# Patient Record
Sex: Female | Born: 1955 | Race: White | Hispanic: No | Smoking: Former smoker
Health system: Southern US, Community
[De-identification: ages and names within clinical notes are randomized; demographics above are authoritative.]

## PROBLEM LIST (undated history)

## (undated) DIAGNOSIS — R011 Cardiac murmur, unspecified: Secondary | ICD-10-CM

## (undated) DIAGNOSIS — M259 Joint disorder, unspecified: Secondary | ICD-10-CM

## (undated) DIAGNOSIS — E785 Hyperlipidemia, unspecified: Secondary | ICD-10-CM

## (undated) DIAGNOSIS — D649 Anemia, unspecified: Secondary | ICD-10-CM

## (undated) DIAGNOSIS — I10 Essential (primary) hypertension: Secondary | ICD-10-CM

## (undated) DIAGNOSIS — F419 Anxiety disorder, unspecified: Secondary | ICD-10-CM

## (undated) DIAGNOSIS — J189 Pneumonia, unspecified organism: Secondary | ICD-10-CM

## (undated) DIAGNOSIS — M1990 Unspecified osteoarthritis, unspecified site: Secondary | ICD-10-CM

## (undated) DIAGNOSIS — G709 Myoneural disorder, unspecified: Secondary | ICD-10-CM

## (undated) DIAGNOSIS — Z889 Allergy status to unspecified drugs, medicaments and biological substances status: Secondary | ICD-10-CM

## (undated) DIAGNOSIS — C562 Malignant neoplasm of left ovary: Secondary | ICD-10-CM

## (undated) DIAGNOSIS — Z8719 Personal history of other diseases of the digestive system: Secondary | ICD-10-CM

## (undated) DIAGNOSIS — K219 Gastro-esophageal reflux disease without esophagitis: Secondary | ICD-10-CM

## (undated) HISTORY — PX: TONSILLECTOMY AND ADENOIDECTOMY: SHX28

## (undated) HISTORY — PX: JOINT REPLACEMENT: SHX530

## (undated) HISTORY — DX: Essential (primary) hypertension: I10

## (undated) HISTORY — PX: ESOPHAGOGASTRODUODENOSCOPY ENDOSCOPY: SHX5814

## (undated) HISTORY — DX: Anxiety disorder, unspecified: F41.9

## (undated) HISTORY — DX: Malignant neoplasm of left ovary: C56.2

## (undated) HISTORY — PX: ABDOMINAL HYSTERECTOMY: SHX81

## (undated) HISTORY — DX: Hyperlipidemia, unspecified: E78.5

## (undated) HISTORY — PX: DILATION AND CURETTAGE, DIAGNOSTIC / THERAPEUTIC: SUR384

## (undated) HISTORY — DX: Unspecified osteoarthritis, unspecified site: M19.90

---

## 2006-08-31 ENCOUNTER — Emergency Department (HOSPITAL_COMMUNITY): Admission: EM | Admit: 2006-08-31 | Discharge: 2006-08-31 | Payer: Self-pay | Admitting: Emergency Medicine

## 2009-05-09 ENCOUNTER — Other Ambulatory Visit: Admission: RE | Admit: 2009-05-09 | Discharge: 2009-05-09 | Payer: Self-pay | Admitting: Family Medicine

## 2009-06-29 ENCOUNTER — Encounter: Admission: RE | Admit: 2009-06-29 | Discharge: 2009-06-29 | Payer: Self-pay | Admitting: Family Medicine

## 2009-10-28 HISTORY — PX: FOOT SURGERY: SHX648

## 2010-04-04 ENCOUNTER — Encounter: Admission: RE | Admit: 2010-04-04 | Discharge: 2010-04-04 | Payer: Self-pay | Admitting: Podiatry

## 2010-08-21 ENCOUNTER — Ambulatory Visit (HOSPITAL_BASED_OUTPATIENT_CLINIC_OR_DEPARTMENT_OTHER): Admission: RE | Admit: 2010-08-21 | Discharge: 2010-08-21 | Payer: Self-pay | Admitting: Podiatry

## 2010-11-16 ENCOUNTER — Emergency Department (HOSPITAL_COMMUNITY)
Admission: EM | Admit: 2010-11-16 | Discharge: 2010-11-17 | Payer: Self-pay | Source: Home / Self Care | Admitting: Emergency Medicine

## 2010-11-17 ENCOUNTER — Ambulatory Visit (HOSPITAL_COMMUNITY)
Admission: RE | Admit: 2010-11-17 | Discharge: 2010-11-17 | Payer: Self-pay | Source: Home / Self Care | Attending: Emergency Medicine | Admitting: Emergency Medicine

## 2010-11-19 LAB — POCT I-STAT, CHEM 8
BUN: 15 mg/dL (ref 6–23)
Creatinine, Ser: 1 mg/dL (ref 0.4–1.2)
Glucose, Bld: 103 mg/dL — ABNORMAL HIGH (ref 70–99)
HCT: 35 % — ABNORMAL LOW (ref 36.0–46.0)
Hemoglobin: 11.9 g/dL — ABNORMAL LOW (ref 12.0–15.0)
Sodium: 141 mEq/L (ref 135–145)
TCO2: 30 mmol/L (ref 0–100)

## 2011-01-09 LAB — POCT I-STAT, CHEM 8
BUN: 7 mg/dL (ref 6–23)
HCT: 38 % (ref 36.0–46.0)
Potassium: 3.9 mEq/L (ref 3.5–5.1)
Sodium: 143 mEq/L (ref 135–145)
TCO2: 30 mmol/L (ref 0–100)

## 2011-07-28 ENCOUNTER — Emergency Department (HOSPITAL_COMMUNITY): Payer: BC Managed Care – PPO

## 2011-07-28 ENCOUNTER — Emergency Department (HOSPITAL_COMMUNITY)
Admission: EM | Admit: 2011-07-28 | Discharge: 2011-07-28 | Disposition: A | Discharge status: Home / Self Care | Payer: BC Managed Care – PPO | Attending: Emergency Medicine | Admitting: Emergency Medicine

## 2011-07-28 DIAGNOSIS — Z79899 Other long term (current) drug therapy: Secondary | ICD-10-CM | POA: Insufficient documentation

## 2011-07-28 DIAGNOSIS — R079 Chest pain, unspecified: Secondary | ICD-10-CM | POA: Insufficient documentation

## 2011-07-28 DIAGNOSIS — I10 Essential (primary) hypertension: Secondary | ICD-10-CM | POA: Insufficient documentation

## 2011-07-28 LAB — POCT I-STAT TROPONIN I: Troponin i, poc: 0 ng/mL (ref 0.00–0.08)

## 2011-07-28 LAB — CBC
HCT: 34.4 % — ABNORMAL LOW (ref 36.0–46.0)
Hemoglobin: 11.2 g/dL — ABNORMAL LOW (ref 12.0–15.0)
MCHC: 32.6 g/dL (ref 30.0–36.0)
MCV: 81.7 fL (ref 78.0–100.0)
Platelets: 251 10*3/uL (ref 150–400)
RBC: 4.21 MIL/uL (ref 3.87–5.11)
WBC: 7.5 10*3/uL (ref 4.0–10.5)

## 2011-07-28 LAB — DIFFERENTIAL
Eosinophils Relative: 6 % — ABNORMAL HIGH (ref 0–5)
Lymphs Abs: 1.8 10*3/uL (ref 0.7–4.0)
Monocytes Absolute: 0.5 10*3/uL (ref 0.1–1.0)
Monocytes Relative: 6 % (ref 3–12)

## 2011-07-28 LAB — BASIC METABOLIC PANEL: BUN: 8 mg/dL (ref 6–23)

## 2011-08-21 NOTE — Op Note (Signed)
Jacqueline Atkinson, Jacqueline Atkinson                ACCOUNT NO.:  [id number]  MEDICAL RECORD NO.:  [id number]          PATIENT TYPE:  AMB  LOCATION:  [facility]                         FACILITY:  [facility]  PHYSICIAN:  Ezequiel Kayser. , D.P.M.DATE OF BIRTH:  [birth year]  DATE OF PROCEDURE:  08/21/2010 DATE OF DISCHARGE:  08/21/2010                              OPERATIVE REPORT   SURGEON:  Ezequiel Kayser. Harriet Pho, DPM  ASSISTANT:  None.  PREOPERATIVE DIAGNOSIS:  Freiberg disease, degenerative changes third metatarsal head, left foot.  POSTOPERATIVE DIAGNOSIS:  Freiberg disease, degenerative changes third metatarsal head, left foot.  PROCEDURE:  First metatarsal head joint replacement, left foot.  ANESTHESIA:  MAC, LMA.  DESCRIPTION OF PROCEDURE:  The patient was brought to the OR and placed in the supine position at which time LMA anesthesia was administered. Local block was performed with 1:1 mixture of 1% lidocaine plain and 0.5% Marcaine plain.  A well-padded pneumatic ankle tourniquet was applied superior to the medial malleolus.  The patient was prepped and draped in the usual aseptic manner.  Foot was exsanguinated with an Esmarch bandage and the previously applied tourniquet inflated to 250 mmHg.  A curvilinear incision was made over the dorsal aspect of the third MPJ. The incision was deepened to the level of the joint with sharp and blunt modalities, taking care to clamp, cauterize all bleeding vessels, and ensuring retraction of all the vascular structures encountered.  Also care was taken to avoid the extensor tendon.  A lateral capsulotomy was then performed and the joint was resected free.  There was a moderate amount of hypertrophic bone at the head of the third metatarsal which was resected from the metatarsal and a mild amount from the base of the proximal phalanx which was also resected.  The recipient bone was freed of its attachment on the medial dorsal and lateral aspects.  The head  of the first metatarsal was evaluated and there was no cartilage.  It was determined that the metatarsal head was not salvageable and the head needed to be replaced.  A neutral cut was made dorsal to plantar removing nonviable metatarsal head and to allow for flat surface for the implant.  Approximately 1-mm beyond the width of the implant measuring about 4-5 mm was resected.  A trial sizer with a head of the metatarsal was placed over the exposed surfaces to determine the correct size of the prosthesis.  This covered the entire surface without protruding down the bone.  With the sizer in position, a K-wire was inserted to mark the center, and the K-wire driven parallel with the shaft.  Position of the K-wire was confirmed with intraoperative radiographs.  Next, the implant was inserted over the K-wire and the implant driven into the intramedullary canal.  The K-wire was then excised and position of the implant was also again confirmed with intraoperative radiographs.  The surgical wound was irrigated with copious amounts of sterile saline and antibiotic solution.  Capsule was reapproximated with 3-0 Vicryl, deep closure accomplished with 4-0 Vicryl, and skin closure accomplished with 4-0 Monocryl in running subcuticular fashion.  Foot was dressed with Steri-Strips,  4x4s, Kling, and Coban.  The patient was sent to the recovery room with vital signs stable and capillary refill time at presurgical levels once the tourniquet was deflated.  All instructions given to the patient.  No guarantees given.          ______________________________ Ezequiel Kayser. Harriet Pho, D.P.M.     MJA/MEDQ  D:  08/26/2010  T:  08/27/2010  Job:  [id number]  Electronically Signed by Larey Dresser D.P.M. on 08/21/2011 05:55:44 PM

## 2012-03-14 ENCOUNTER — Ambulatory Visit (INDEPENDENT_AMBULATORY_CARE_PROVIDER_SITE_OTHER): Payer: 59 | Admitting: Internal Medicine

## 2012-03-14 VITALS — BP 127/82 | HR 76 | Temp 98.1°F | Resp 16 | Ht 68.0 in | Wt 209.0 lb

## 2012-03-14 DIAGNOSIS — R350 Frequency of micturition: Secondary | ICD-10-CM

## 2012-03-14 DIAGNOSIS — N390 Urinary tract infection, site not specified: Secondary | ICD-10-CM

## 2012-03-14 LAB — POCT URINALYSIS DIPSTICK
Bilirubin, UA: NEGATIVE
Glucose, UA: NEGATIVE
Nitrite, UA: NEGATIVE
Protein, UA: NEGATIVE
Urobilinogen, UA: 0.2
pH, UA: 5

## 2012-03-14 LAB — POCT UA - MICROSCOPIC ONLY
Casts, Ur, LPF, POC: NEGATIVE
Crystals, Ur, HPF, POC: NEGATIVE
Mucus, UA: POSITIVE

## 2012-03-14 MED ORDER — CIPROFLOXACIN HCL 500 MG PO TABS: 500.0000 mg | ORAL_TABLET | Freq: Two times a day (BID) | ORAL | Status: AC

## 2012-03-14 NOTE — Progress Notes (Signed)
  Subjective:    Patient ID: Jacqueline Atkinson, female    DOB: Jun 17, 1956, 56 y.o.   MRN: [id number]  HPIHistory recurrent UTIs Started with symptoms of dysuria and frequency 2 weeks ago AZO- standard for the last 2 weeks but symptoms have only gotten worse this weekend No fever No abdominal pain    Review of Systems     Objective:   Physical Exam Vital signs stable Abdomen nontender No CVA tenderness to percussion       Results for orders placed in visit on 03/14/12  POCT UA - MICROSCOPIC ONLY      Component Value Range   WBC, Ur, HPF, POC tntc     RBC, urine, microscopic 1-3     Bacteria, U Microscopic 3+     Mucus, UA pos     Epithelial cells, urine per micros 2-4     Crystals, Ur, HPF, POC neg     Casts, Ur, LPF, POC neg     Yeast, UA neg    POCT URINALYSIS DIPSTICK      Component Value Range   Color, UA yellow     Clarity, UA clear     Glucose, UA neg     Bilirubin, UA neg     Ketones, UA neg     Spec Grav, UA >=1.030     Blood, UA neg     pH, UA 5.0     Protein, UA neg     Urobilinogen, UA 0.2     Nitrite, UA neg     Leukocytes, UA Trace      Assessment & Plan:  Problem #1 urinary tract infection with urinary frequency Culture/Call with results Meds ordered this encounter  Medications  .       . ciprofloxacin (CIPRO) 500 MG tablet    Sig: Take 1 tablet (500 mg total) by mouth 2 (two) times daily.    Dispense:  20 tablet    Refill:  0

## 2012-11-18 ENCOUNTER — Ambulatory Visit (INDEPENDENT_AMBULATORY_CARE_PROVIDER_SITE_OTHER): Payer: 59 | Admitting: Physician Assistant

## 2012-11-18 VITALS — BP 164/104 | HR 83 | Temp 98.4°F | Resp 17 | Ht 68.0 in | Wt 213.0 lb

## 2012-11-18 DIAGNOSIS — J029 Acute pharyngitis, unspecified: Secondary | ICD-10-CM

## 2012-11-18 DIAGNOSIS — R51 Headache: Secondary | ICD-10-CM

## 2012-11-18 DIAGNOSIS — R509 Fever, unspecified: Secondary | ICD-10-CM

## 2012-11-18 LAB — POCT RAPID STREP A (OFFICE): Rapid Strep A Screen: NEGATIVE

## 2012-11-18 LAB — POCT INFLUENZA A/B
Influenza A, POC: NEGATIVE
Influenza B, POC: NEGATIVE

## 2012-11-18 MED ORDER — OSELTAMIVIR PHOSPHATE 75 MG PO CAPS: 75.0000 mg | ORAL_CAPSULE | Freq: Two times a day (BID) | ORAL | Status: DC

## 2012-11-18 MED ORDER — HYDROCODONE-HOMATROPINE 5-1.5 MG/5ML PO SYRP: 5.0000 mL | ORAL_SOLUTION | Freq: Three times a day (TID) | ORAL | Status: DC | PRN

## 2012-11-18 NOTE — Progress Notes (Signed)
Patient ID: Jacqueline Atkinson MRN: [id number], DOB: [birth year], 57 y.o. Date of Encounter: 11/18/2012, 1:54 PM  Primary Physician: No primary provider on file.  Chief Complaint: fever, chills, nasal congestion  HPI: 57 y.o. year old female with presents with 4 hour history of chills, body aches, sore throat, headache and slight dry cough. Denies chest pain, SOB, nausea, vomiting, or dizziness. Symptoms started suddenly today when she was at work.  Has tried ibuprofen which has helped with her headache. She is currently taking prednisone dose pack - last dose will be tomorrow - for treatment of arthritis.  Does have a history of HTN that has been treated but she missed her physical last year and has been off medication since then.  States her BP at home is usually around 140/90.  No dizziness, lightheadedness, or chest pain.   Patient is otherwise doing well without issues or complaints.  Past Medical History  Diagnosis Date  . Allergy   . Arthritis   . Hypertension      Home Meds: Prior to Admission medications   Medication Sig Start Date End Date Taking? Authorizing Provider  methylPREDNISolone (MEDROL DOSEPAK) 4 MG tablet Take 4 mg by mouth. follow package directions   Yes Historical Provider, MD  bisoprolol-hydrochlorothiazide (ZIAC) 2.5-6.25 MG per tablet Take 1 tablet by mouth daily.    Historical Provider, MD    Allergies:  Allergies  Allergen Reactions  . Sulfa Antibiotics     History   Social History  . Marital Status: Divorced    Spouse Name: N/A    Number of Children: N/A  . Years of Education: N/A   Occupational History  . Not on file.   Social History Main Topics  . Smoking status: Former Smoker    Quit date: 03/14/1988  . Smokeless tobacco: Never Used  . Alcohol Use: Not on file  . Drug Use: Not on file  . Sexually Active: Not on file   Other Topics Concern  . Not on file   Social History Narrative  . No narrative on file     Review of  Systems: Constitutional: positive for chills and fever. Negative for night sweats, weight changes, or fatigue  HEENT: negative for vision changes, hearing loss. Positive for congestion, rhinorrhea, ST. No epistaxis, or sinus pressure Cardiovascular: negative for chest pain or palpitations Respiratory: positive for cough. negative for hemoptysis, wheezing, shortness of breath. Abdominal: negative for abdominal pain, nausea, vomiting, diarrhea, or constipation Dermatological: negative for rashes. Neurologic: negative for headache, dizziness, or syncope   Physical Exam: Blood pressure 164/104, pulse 83, temperature 98.4 F (36.9 C), temperature source Oral, resp. rate 17, height 5\' 8"  (1.727 m), weight 213 lb (96.616 kg), SpO2 96.00%., Body mass index is 32.39 kg/(m^2). General: Well developed, well nourished, in no acute distress. Head: Normocephalic, atraumatic, eyes without discharge, sclera non-icteric, nares are without discharge. Bilateral auditory canals clear, TM's are without perforation, pearly grey and translucent with reflective cone of light bilaterally. Oral cavity moist, posterior pharynx without exudate, erythema, peritonsillar abscess, or post nasal drip.  Neck: Supple. No thyromegaly. Full ROM. No lymphadenopathy. Lungs: Clear bilaterally to auscultation without wheezes, rales, or rhonchi. Breathing is unlabored. Heart: RRR with S1 S2. No murmurs, rubs, or gallops appreciated. Msk:  Strength and tone normal for age. Extremities/Skin: Warm and dry. No clubbing or cyanosis. No edema.  Neuro: Alert and oriented X 3. Moves all extremities spontaneously. Gait is normal. CNII-XII grossly in tact. Psych:  Responds to questions appropriately  with a normal affect.   Labs:   ASSESSMENT AND PLAN:  57 y.o. year old female with influenza Despite negative flu test, will go ahead and start Tamiflu. Increase fluids and rest Ibuprofen and tylenol in alternating doses q4hours Start  Tamiflu 75 mg bid x 5 days Hycodan q8hours prn cough Follow up if symptoms worsen or fail to improve, especially if dyspnea or productive cough develop.  Patient declined treatment of HTN today - I encouraged her to follow up with her PCP.   Grier Mitts, PA-C 11/18/2012 1:54 PM

## 2012-11-27 ENCOUNTER — Ambulatory Visit (INDEPENDENT_AMBULATORY_CARE_PROVIDER_SITE_OTHER): Payer: 59 | Admitting: Family Medicine

## 2012-11-27 ENCOUNTER — Encounter: Payer: Self-pay | Admitting: Family Medicine

## 2012-11-27 VITALS — BP 148/88 | HR 77 | Temp 98.2°F | Resp 18 | Ht 68.0 in | Wt 214.0 lb

## 2012-11-27 DIAGNOSIS — K219 Gastro-esophageal reflux disease without esophagitis: Secondary | ICD-10-CM

## 2012-11-27 DIAGNOSIS — Z0000 Encounter for general adult medical examination without abnormal findings: Secondary | ICD-10-CM

## 2012-11-27 DIAGNOSIS — Z01419 Encounter for gynecological examination (general) (routine) without abnormal findings: Secondary | ICD-10-CM

## 2012-11-27 DIAGNOSIS — Z79899 Other long term (current) drug therapy: Secondary | ICD-10-CM

## 2012-11-27 DIAGNOSIS — Z23 Encounter for immunization: Secondary | ICD-10-CM

## 2012-11-27 DIAGNOSIS — I10 Essential (primary) hypertension: Secondary | ICD-10-CM

## 2012-11-27 LAB — IFOBT (OCCULT BLOOD): IFOBT: NEGATIVE

## 2012-11-27 LAB — CBC WITH DIFFERENTIAL/PLATELET
Basophils Relative: 0 % (ref 0–1)
Eosinophils Absolute: 0.2 10*3/uL (ref 0.0–0.7)
Lymphocytes Relative: 23 % (ref 12–46)
Monocytes Relative: 5 % (ref 3–12)
Neutro Abs: 5.8 10*3/uL (ref 1.7–7.7)
Neutrophils Relative %: 69 % (ref 43–77)
RDW: 16.3 % — ABNORMAL HIGH (ref 11.5–15.5)
WBC: 8.4 10*3/uL (ref 4.0–10.5)

## 2012-11-27 LAB — COMPREHENSIVE METABOLIC PANEL
AST: 14 U/L (ref 0–37)
Albumin: 4.1 g/dL (ref 3.5–5.2)
BUN: 12 mg/dL (ref 6–23)
CO2: 28 mEq/L (ref 19–32)
Creat: 0.72 mg/dL (ref 0.50–1.10)
Potassium: 4 mEq/L (ref 3.5–5.3)
Sodium: 142 mEq/L (ref 135–145)
Total Bilirubin: 0.4 mg/dL (ref 0.3–1.2)
Total Protein: 6.6 g/dL (ref 6.0–8.3)

## 2012-11-27 LAB — LIPID PANEL
HDL: 55 mg/dL (ref >39)
LDL Cholesterol: 139 mg/dL — ABNORMAL HIGH (ref 0–99)
VLDL: 18 mg/dL (ref 0–40)

## 2012-11-27 MED ORDER — BISOPROLOL-HYDROCHLOROTHIAZIDE 2.5-6.25 MG PO TABS: 1.0000 | ORAL_TABLET | Freq: Every day | ORAL | Status: DC

## 2012-11-27 NOTE — Progress Notes (Signed)
Subjective:    Patient ID: Jacqueline Atkinson, female    DOB: 1956/02/16, 57 y.o.   MRN: [id number] Chief Complaint  Patient presents with  . Annual Exam    pt not sure if pap is needed   HPI  Jacqueline Atkinson is here for her HM exam.  She has not had preventative medical care in years as she has been caught up with acute MSK problems.  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  The podiatrist that was following her was concerned that she was hyper-reflexive in her legs so he sent her to neurologist who told her she likely had arthritis in her neck which would cause (??) this since she had arthritis everywhere else in her body but not going to do imaging as no point unless she develops incontinence, weakness, numbness, etc. Now going to PT regularly for muscle spasms in her low back.  So now she is more aware of her pelvic and hips. She is walking better than she was 6 wks ago - trying to use muscles better and stretching but has had 21 doctors and therapy appts this mo.   She does have an appt with the dentist and for an eye exam sched.  Her last mammogram was 2 yrs ago and she has never had a colonoscopy.  She is having a lot of heartburn at night. Was on some prilosec but always forgets to take it as she is not supposed to take it within 4 hrs of taking her indocin.  Past Medical History  Diagnosis Date  . Allergy   . Arthritis   . Hypertension   . Anxiety    Past Surgical History  Procedure Date  . Foot surgery 2011    3rd metatarsal LT foot   Current Outpatient Prescriptions on File Prior to Visit  Medication Sig Dispense Refill  . omeprazole (PRILOSEC) 40 MG capsule Take 1 capsule (40 mg total) by mouth daily.  30 capsule  1  . bisoprolol-hydrochlorothiazide (ZIAC)  2.5-6.25 MG per tablet Take 1 tablet by mouth daily.  90 tablet  0  . HYDROcodone-homatropine (HYCODAN) 5-1.5 MG/5ML syrup Take 5 mLs by mouth every 8 (eight) hours as needed for cough.  120 mL  0  . methylPREDNISolone (MEDROL DOSEPAK) 4 MG tablet Take 4 mg by mouth. follow package directions      . oseltamivir (TAMIFLU) 75 MG capsule Take 1 capsule (75 mg total) by mouth 2 (two) times daily.  10 capsule  0   Allergies  Allergen Reactions  . Sulfa Antibiotics    Family History  Problem Relation Age of Onset  . Cancer Mother   . Diabetes Mother   . Kidney disease Mother   . COPD Father   . Brain cancer Brother    History   Social History  . Marital Status: Divorced    Spouse Name: N/A    Number of Children: N/A  . Years of Education: N/A   Social History Main Topics  . Smoking status: Former Smoker    Quit date: 03/14/1988  . Smokeless tobacco: Never Used  . Alcohol Use: No  . Drug Use: No  . Sexually Active: None   Other Topics Concern  . None  Social History Narrative  . None    Review of Systems  HENT: Positive for postnasal drip and sinus pressure.   Respiratory: Positive for cough.   Musculoskeletal: Positive for myalgias, back pain, joint swelling, arthralgias and gait problem.      BP 148/88  Pulse 77  Temp 98.2 F (36.8 C)  Resp 18  Ht 5\' 8"  (1.727 m)  Wt 214 lb (97.07 kg)  BMI 32.54 kg/m2 Objective:   Physical Exam  Constitutional: She is oriented to person, place, and time. She appears well-developed and well-nourished. No distress.  HENT:  Head: Normocephalic and atraumatic.  Right Ear: Tympanic membrane, external ear and ear canal normal.  Left Ear: Tympanic membrane, external ear and ear canal normal.  Nose: Mucosal edema present. No rhinorrhea.  Mouth/Throat: Uvula is midline, oropharynx is clear and moist and mucous membranes are normal. No posterior oropharyngeal erythema.  Eyes: Conjunctivae normal and EOM are normal. Pupils are equal,  round, and reactive to light. Right eye exhibits no discharge. Left eye exhibits no discharge. No scleral icterus.  Neck: Normal range of motion. Neck supple. No thyromegaly present.  Cardiovascular: Normal rate, regular rhythm, normal heart sounds and intact distal pulses.   Pulmonary/Chest: Effort normal and breath sounds normal. No respiratory distress.  Abdominal: Soft. Bowel sounds are normal. There is no tenderness.  Genitourinary: Vagina normal and uterus normal. Cervix exhibits no motion tenderness and no friability. Right adnexum displays no mass and no tenderness. Left adnexum displays no mass and no tenderness.  Musculoskeletal: She exhibits no edema.  Lymphadenopathy:    She has no cervical adenopathy.  Neurological: She is alert and oriented to person, place, and time. She has normal reflexes.  Skin: Skin is warm and dry. She is not diaphoretic. No erythema.  Psychiatric: She has a normal mood and affect. Her behavior is normal.          Assessment & Plan:  1. HM - Pap done today. Pt agrees to discuss colonoscopy and mammogram referral at next visit but declines today due to the # of doctors visits she has recently had.  Will get Tdap today 2. Elev BP - restart bisoprolol-hctz - recheck bmp and bmp at f/u in 2 mos. 3. GERD - restart prilosec before dinner (30 min if she can) and take indocin after dinner (1 hr if she can). Likely indigestion has been exacerbated by prolonged nsaid use.  1. Hypertension  bisoprolol-hydrochlorothiazide (ZIAC) 2.5-6.25 MG per tablet  2. Encounter for long-term (current) use of other medications  Lipid panel, Comprehensive metabolic panel, CBC with Differential, TSH  3. Routine general medical examination at a health care facility  Comprehensive metabolic panel, CBC with Differential, TSH, IFOBT POC (occult bld, rslt in office), Pap IG and HPV (high risk) DNA detection  4. Need for Tdap vaccination  Tdap vaccine greater than or equal to 7yo IM  5.  Routine gynecological examination  Pap IG and HPV (high risk) DNA detection   Meds ordered this encounter  Medications  . indomethacin (INDOCIN) 25 MG capsule    Sig: Take 25 mg by mouth 2 (two) times daily with a meal.  . DISCONTD: omeprazole (PRILOSEC) 10 MG capsule    Sig: Take 10 mg by mouth daily.  . bisoprolol-hydrochlorothiazide (ZIAC) 2.5-6.25 MG per tablet    Sig: Take 1 tablet by mouth daily.    Dispense:  90 tablet    Refill:  0  . omeprazole (PRILOSEC) 40 MG capsule  Sig: Take 1 capsule (40 mg total) by mouth daily.    Dispense:  30 capsule    Refill:  1

## 2012-11-28 DIAGNOSIS — I10 Essential (primary) hypertension: Secondary | ICD-10-CM | POA: Insufficient documentation

## 2012-11-28 DIAGNOSIS — Z79899 Other long term (current) drug therapy: Secondary | ICD-10-CM | POA: Insufficient documentation

## 2012-11-28 DIAGNOSIS — K219 Gastro-esophageal reflux disease without esophagitis: Secondary | ICD-10-CM | POA: Insufficient documentation

## 2012-11-28 MED ORDER — OMEPRAZOLE 40 MG PO CPDR: 40.0000 mg | DELAYED_RELEASE_CAPSULE | Freq: Every day | ORAL | Status: DC

## 2012-12-02 LAB — PAP IG AND HPV HIGH-RISK: HPV DNA High Risk: NOT DETECTED

## 2012-12-19 ENCOUNTER — Encounter: Payer: Self-pay | Admitting: Nurse Practitioner

## 2012-12-19 DIAGNOSIS — R202 Paresthesia of skin: Secondary | ICD-10-CM | POA: Insufficient documentation

## 2012-12-19 DIAGNOSIS — M25569 Pain in unspecified knee: Secondary | ICD-10-CM | POA: Insufficient documentation

## 2012-12-21 ENCOUNTER — Encounter: Payer: Self-pay | Admitting: Nurse Practitioner

## 2012-12-23 ENCOUNTER — Telehealth: Payer: Self-pay

## 2012-12-23 NOTE — Telephone Encounter (Signed)
Please advise 

## 2012-12-23 NOTE — Telephone Encounter (Signed)
If pt is not tolerating the medication, she should stop taking it.  But she likely needs to be on something else for BP control.  What has she been on before?

## 2012-12-23 NOTE — Telephone Encounter (Signed)
Pt wants Dr. Clelia Croft to change her blood pressure medications bisoprolol-hydrochlorothiazide (ZIAC) 2.5-6.25 MG per tablet   No reduction with meds, she has a constant headache  Pharmacy on [address]   CBN:  [phone]

## 2012-12-24 NOTE — Telephone Encounter (Signed)
166/92 158/95 has previously taken Lisinopril did not have trouble with this [employer], please advise

## 2012-12-24 NOTE — Telephone Encounter (Signed)
Left message for her to call back so I can advise.  

## 2012-12-25 MED ORDER — LISINOPRIL 10 MG PO TABS: 10.0000 mg | ORAL_TABLET | Freq: Every day | ORAL | Status: DC

## 2012-12-25 NOTE — Telephone Encounter (Signed)
Left message to have her call me back.

## 2012-12-25 NOTE — Telephone Encounter (Signed)
Lisinopril sent.  She should RTC in 6 weeks to see how it's working.  Meds ordered this encounter  Medications  . lisinopril (PRINIVIL,ZESTRIL) 10 MG tablet    Sig: Take 1 tablet (10 mg total) by mouth daily. Need office visit for additional refills.    Dispense:  90 tablet    Refill:  0    Order Specific Question:  Supervising Provider    Answer:  DOOLITTLE, ROBERT P [[id number]]

## 2013-01-20 ENCOUNTER — Ambulatory Visit: Payer: 59 | Admitting: Nurse Practitioner

## 2013-02-26 ENCOUNTER — Ambulatory Visit: Payer: 59 | Admitting: Family Medicine

## 2013-03-02 ENCOUNTER — Ambulatory Visit (INDEPENDENT_AMBULATORY_CARE_PROVIDER_SITE_OTHER): Payer: 59 | Admitting: Physician Assistant

## 2013-03-02 ENCOUNTER — Telehealth: Payer: Self-pay

## 2013-03-02 VITALS — BP 150/92 | HR 78 | Temp 98.5°F | Resp 16 | Ht 66.0 in | Wt 218.0 lb

## 2013-03-02 DIAGNOSIS — I10 Essential (primary) hypertension: Secondary | ICD-10-CM

## 2013-03-02 MED ORDER — CHLORTHALIDONE 25 MG PO TABS: 25.0000 mg | ORAL_TABLET | Freq: Every day | ORAL | Status: DC

## 2013-03-02 NOTE — Telephone Encounter (Signed)
PT STATES DR Clelia Croft WANTED TO KEEP A RECORD OF HER BLOOD PRESSURE AND THIS MORNING IT IS 181/101. ADVISED PT TO COME AND SEE DR BUT STATED SHE CAN'T GET OFF PLEASE CALL [phone]

## 2013-03-02 NOTE — Telephone Encounter (Signed)
Pt was scheduled to see me last wk but missed her appt. Fortunately, she came in and saw Maralyn Sago today to treat her HTN.

## 2013-03-02 NOTE — Progress Notes (Signed)
   [address], [place] [address]   Phone [phone]   Subjective:    Patient ID: Jacqueline Atkinson, female    DOB: 1955-12-10, 57 y.o.   MRN: [id number]  HPI Pt presents to clinic for BP check. She was restarted on BP meds in Jan and has a F/u planned on Friday with Dr Clelia Croft but she went to work today with a terrible HA and got her BP checked by the nurse and it was really high.  They checked it several times   160/110 - another reading 192/120 - was the highest reading  after these readings she was told by the nurse at work with her BP and HA that she should not stay at work.  She came here and since the HA has resolved.  She is having no CP and feels good.  She   Review of Systems  Cardiovascular: Negative for chest pain.  Neurological: Positive for headaches (resolved now).       Objective:   Physical Exam  Vitals reviewed. Constitutional: She is oriented to person, place, and time. She appears well-developed and well-nourished.  HENT:  Head: Normocephalic and atraumatic.  Right Ear: External ear normal.  Left Ear: External ear normal.  Eyes: Conjunctivae are normal.  Cardiovascular: Normal rate, regular rhythm and normal heart sounds.   No murmur heard. Pulmonary/Chest: Effort normal.  Neurological: She is alert and oriented to person, place, and time.  Skin: Skin is warm and dry.  Psychiatric: She has a normal mood and affect. Her behavior is normal. Judgment and thought content normal.       Assessment & Plan:  HTN (hypertension) - Plan: chlorthalidone (HYGROTON) 25 MG tablet - Pt will continue on her Lisinopril and we will add diuretic = pt will continue to check her BP at work and f/u on Friday with her scheduled appt.  Benny Lennert PA-C 03/02/2013 4:40 PM

## 2013-03-05 ENCOUNTER — Ambulatory Visit (INDEPENDENT_AMBULATORY_CARE_PROVIDER_SITE_OTHER): Payer: 59 | Admitting: Family Medicine

## 2013-03-05 ENCOUNTER — Encounter: Payer: Self-pay | Admitting: Family Medicine

## 2013-03-05 VITALS — BP 136/62 | HR 81 | Temp 98.2°F | Resp 16 | Ht 67.0 in | Wt 215.0 lb

## 2013-03-05 DIAGNOSIS — E785 Hyperlipidemia, unspecified: Secondary | ICD-10-CM

## 2013-03-05 DIAGNOSIS — I10 Essential (primary) hypertension: Secondary | ICD-10-CM

## 2013-03-05 DIAGNOSIS — Z79899 Other long term (current) drug therapy: Secondary | ICD-10-CM

## 2013-03-05 DIAGNOSIS — R011 Cardiac murmur, unspecified: Secondary | ICD-10-CM

## 2013-03-05 DIAGNOSIS — R51 Headache: Secondary | ICD-10-CM

## 2013-03-05 DIAGNOSIS — E559 Vitamin D deficiency, unspecified: Secondary | ICD-10-CM

## 2013-03-05 DIAGNOSIS — R0989 Other specified symptoms and signs involving the circulatory and respiratory systems: Secondary | ICD-10-CM

## 2013-03-05 DIAGNOSIS — K219 Gastro-esophageal reflux disease without esophagitis: Secondary | ICD-10-CM

## 2013-03-05 LAB — COMPREHENSIVE METABOLIC PANEL
ALT: 18 U/L (ref 0–35)
AST: 19 U/L (ref 0–37)
Albumin: 4.3 g/dL (ref 3.5–5.2)
CO2: 30 mEq/L (ref 19–32)
Calcium: 9.6 mg/dL (ref 8.4–10.5)
Chloride: 100 mEq/L (ref 96–112)
Potassium: 4 mEq/L (ref 3.5–5.3)
Sodium: 139 mEq/L (ref 135–145)
Total Protein: 7.1 g/dL (ref 6.0–8.3)

## 2013-03-05 LAB — LIPID PANEL
Cholesterol: 194 mg/dL (ref 0–200)
Total CHOL/HDL Ratio: 3.7 Ratio
VLDL: 30 mg/dL (ref 0–40)

## 2013-03-05 LAB — CBC WITH DIFFERENTIAL/PLATELET
Basophils Relative: 0 % (ref 0–1)
Lymphocytes Relative: 21 % (ref 12–46)
MCHC: 33.1 g/dL (ref 30.0–36.0)
Monocytes Absolute: 0.6 10*3/uL (ref 0.1–1.0)
Neutro Abs: 5.7 10*3/uL (ref 1.7–7.7)
Platelets: 249 10*3/uL (ref 150–400)
RDW: 15.9 % — ABNORMAL HIGH (ref 11.5–15.5)
WBC: 8.2 10*3/uL (ref 4.0–10.5)

## 2013-03-05 MED ORDER — OMEPRAZOLE 40 MG PO CPDR: 40.0000 mg | DELAYED_RELEASE_CAPSULE | Freq: Every day | ORAL | Status: DC

## 2013-03-05 MED ORDER — BUTALBITAL-APAP-CAFFEINE 50-325-40 MG PO TABS: 1.0000 | ORAL_TABLET | Freq: Four times a day (QID) | ORAL | Status: DC | PRN

## 2013-03-05 NOTE — Progress Notes (Signed)
  Subjective:    Patient ID: Jacqueline Atkinson, female    DOB: [birth year], 57 y.o.   MRN: [id number]  HPI is doing fish oil 1400mg  qd - split into 2doses, flax seen 2600mg  d, red yeast rice 1200mg , niacn 125, can take coenzyme q10, 5000iu vit d daily. .  Started chlorthalidone  No h/o heart murmur. Does get lightheaded after sitting for a while or sometimes if she is up and around. Chronic swelling.  Is getting more exercise tolerance, no SHoB, no CP  Review of Systems     Objective:   Physical Exam  Constitutional: She is oriented to person, place, and time. She appears well-developed and well-nourished. No distress.  HENT:  Head: Normocephalic and atraumatic.  Right Ear: External ear normal.  Left Ear: External ear normal.  Eyes: Conjunctivae are normal. No scleral icterus.  Neck: Normal range of motion. Neck supple. No thyromegaly present.  Cardiovascular: Normal rate, regular rhythm and intact distal pulses.   Murmur heard.  Decrescendo systolic murmur is present with a grade of 3/6  Pulmonary/Chest: Effort normal and breath sounds normal. No respiratory distress.  Musculoskeletal: She exhibits no edema.  Lymphadenopathy:    She has no cervical adenopathy.  Neurological: She is alert and oriented to person, place, and time.  Skin: Skin is warm and dry. She is not diaphoretic. No erythema.  Psychiatric: She has a normal mood and affect. Her behavior is normal.          Assessment & Plan:  Will check bp outside office several times and call in 1-2 wks - if very good, can try stopping lisinopril.

## 2013-03-05 NOTE — Patient Instructions (Signed)
Potassium Content of Foods Potassium is a mineral. The body needs potassium to control blood pressure and to keep the muscles and nervous system healthy. Most foods contain potassium. Eating a variety of foods in the right amounts will help control the level of potassium in your body. Kidney problems can cause there to be too much potassium in the body. If this happens, you may need to lower the amount of potassium in your diet. Some medicines, such as diuretics, may cause your body to lose too much potassium. If this happens, you may need to increase the amount of potassium in your diet. COMMON SERVING SIZES The list below tells you how big or small some common portion sizes are:  1 oz.........4 stacked dice.  3 oz.........Deck of cards.  1 tsp........Tip of little finger.  1 tbs........Thumb.  2 tbs........Golf ball.   cup.......Half of a fist.  1 cup........A fist. FOODS AND DRINKS HIGH IN POTASSIUM More than 250 mg per serving.  Bran cereals and other bran products.  Milk (skim, 1%, 2%, whole).  Buttermilk.  Yogurt.  Avocados.  Bananas.  Dried fruits.  Kiwis.  Oranges.  Prunes.  Raisins.  Baked beans.  Spinach.  Tomatoes.  Peanut butter.  Nuts.  Tofu.  Potatoes. FOODS MODERATE IN POTASSIUM Between 150 to 250 mg per serving.  Cherries.  Mangoes.  Asparagus.  Peas.  Zucchini.  Celery.  C[place]aloupes.  Fresh peaches.  Broccoli stalks.  Peppers.  Figs.  Fresh pears.  Kale.  Summer squashes. FOODS LOW IN POTASSIUM Less than 150 mg per serving.  Pasta.  Rice.  Cottage cheese.  Cheddar cheese.  Apples.  Grapes.  Pineapple.  Raspberries.  Strawberries.  Watermelon.  Green beans.  Cabbage.  Cauliflower.  Corn.  Mushrooms.  Onions.  Eggs. Document Released: 05/28/2005 Document Revised: 01/06/2012 Document Reviewed: 02/28/2012 [employer] Patient Information 2013 [employer].  

## 2013-03-06 LAB — VITAMIN D 25 HYDROXY (VIT D DEFICIENCY, FRACTURES): Vit D, 25-Hydroxy: 44 ng/mL (ref 30–89)

## 2013-03-11 ENCOUNTER — Emergency Department (HOSPITAL_COMMUNITY)
Admission: EM | Admit: 2013-03-11 | Discharge: 2013-03-11 | Disposition: A | Discharge status: Home / Self Care | Payer: 59 | Attending: Emergency Medicine | Admitting: Emergency Medicine

## 2013-03-11 ENCOUNTER — Encounter (HOSPITAL_COMMUNITY): Payer: Self-pay | Admitting: Emergency Medicine

## 2013-03-11 ENCOUNTER — Telehealth: Payer: Self-pay

## 2013-03-11 DIAGNOSIS — Z79899 Other long term (current) drug therapy: Secondary | ICD-10-CM | POA: Insufficient documentation

## 2013-03-11 DIAGNOSIS — R110 Nausea: Secondary | ICD-10-CM | POA: Insufficient documentation

## 2013-03-11 DIAGNOSIS — I10 Essential (primary) hypertension: Secondary | ICD-10-CM | POA: Insufficient documentation

## 2013-03-11 DIAGNOSIS — E878 Other disorders of electrolyte and fluid balance, not elsewhere classified: Secondary | ICD-10-CM | POA: Insufficient documentation

## 2013-03-11 DIAGNOSIS — F411 Generalized anxiety disorder: Secondary | ICD-10-CM | POA: Insufficient documentation

## 2013-03-11 DIAGNOSIS — R42 Dizziness and giddiness: Secondary | ICD-10-CM | POA: Insufficient documentation

## 2013-03-11 DIAGNOSIS — M129 Arthropathy, unspecified: Secondary | ICD-10-CM | POA: Insufficient documentation

## 2013-03-11 DIAGNOSIS — Z87891 Personal history of nicotine dependence: Secondary | ICD-10-CM | POA: Insufficient documentation

## 2013-03-11 DIAGNOSIS — Z7982 Long term (current) use of aspirin: Secondary | ICD-10-CM | POA: Insufficient documentation

## 2013-03-11 MED ORDER — LORAZEPAM 1 MG PO TABS: 1.0000 mg | ORAL_TABLET | Freq: Four times a day (QID) | ORAL | Status: DC | PRN

## 2013-03-11 MED ORDER — MECLIZINE HCL 25 MG PO TABS
25.0000 mg | ORAL_TABLET | Freq: Once | ORAL | Status: AC
  Administered 2013-03-11: 25 mg via ORAL
  Filled 2013-03-11: qty 1

## 2013-03-11 MED ORDER — MECLIZINE HCL 25 MG PO TABS: 25.0000 mg | ORAL_TABLET | Freq: Three times a day (TID) | ORAL | Status: DC

## 2013-03-11 MED ORDER — LORAZEPAM 1 MG PO TABS
1.0000 mg | ORAL_TABLET | Freq: Once | ORAL | Status: AC
  Administered 2013-03-11: 1 mg via ORAL
  Filled 2013-03-11: qty 1

## 2013-03-11 MED ORDER — ACETAMINOPHEN 500 MG PO TABS
1000.0000 mg | ORAL_TABLET | Freq: Once | ORAL | Status: AC
  Administered 2013-03-11: 1000 mg via ORAL
  Filled 2013-03-11: qty 2

## 2013-03-11 NOTE — ED Provider Notes (Signed)
History     CSN: [id number]  Arrival date & time 03/11/13  1503   First MD Initiated Contact with Patient 03/11/13 1630      Chief Complaint  Patient presents with  . Dizziness  . Nausea    (Consider location/radiation/quality/duration/timing/severity/associated sxs/prior treatment) HPI  Patient presents with nausea, disequilibrium, dizziness. She said over the past week she has had similar events, though not as profound as today. States approximately 2 hours ago she suddenly became particularly nauseous, with severe disequilibrium.  Symptoms are worse with head motion, eye opening.  Minimally better at rest. No medication taken thus far. She denies headache, visual acuity loss, confusion, disorientation, vomiting, or falls. She states that her symptoms began without clear precipitant about one week ago, but she has recently started new antihypertensive medication.   Past Medical History  Diagnosis Date  . Allergy   . Arthritis   . Hypertension   . Anxiety     Past Surgical History  Procedure Laterality Date  . Foot surgery  2011    3rd metatarsal LT foot    Family History  Problem Relation Age of Onset  . Cancer Mother   . Diabetes Mother   . Kidney disease Mother   . COPD Father   . Brain cancer Brother     History  Substance Use Topics  . Smoking status: Former Smoker    Quit date: 03/14/1988  . Smokeless tobacco: Never Used  . Alcohol Use: No    OB History   Grav Para Term Preterm Abortions TAB SAB Ect Mult Living                  Review of Systems  All other systems reviewed and are negative.    Allergies  Sulfa antibiotics  Home Medications   Current Outpatient Rx  Name  Route  Sig  Dispense  Refill  . acetaminophen (TYLENOL) 500 MG tablet   Oral   Take 1,000 mg by mouth 2 (two) times daily as needed for pain.         Marland Kitchen aspirin EC 81 MG tablet   Oral   Take 81 mg by mouth daily.         . butalbital-acetaminophen-caffeine  (FIORICET) 50-325-40 MG per tablet   Oral   Take 1-2 tablets by mouth every 6 (six) hours as needed for headache.   20 tablet   0   . chlorthalidone (HYGROTON) 25 MG tablet   Oral   Take 25 mg by mouth every evening.         . Cholecalciferol (VITAMIN D3) 5000 UNITS CAPS   Oral   Take 5,000 mg by mouth every evening.         . Coenzyme Q10 (CO Q 10) 100 MG CAPS   Oral   Take 100 mg by mouth daily.          . Flaxseed, Linseed, 1300 MG CAPS   Oral   Take 1,300 mg by mouth 2 (two) times daily.          . indomethacin (INDOCIN) 25 MG capsule   Oral   Take 25-50 mg by mouth 2 (two) times daily with a meal. 2 tablets (50 mg) every morning and 1 tablet (25 mg) every evening         . lisinopril (PRINIVIL,ZESTRIL) 10 MG tablet   Oral   Take 10 mg by mouth every evening.         Marland Kitchen NIACIN  PO   Oral   Take 62.5 mg by mouth 2 (two) times daily. 1/2 tablet 62.5 mg (tablet is 125 mg)         . Omega-3 Fatty Acids (FISH OIL PO)   Oral   Take 1 capsule by mouth 2 (two) times daily.         Marland Kitchen omeprazole (PRILOSEC) 40 MG capsule   Oral   Take 40 mg by mouth daily with supper.         . Red Yeast Rice Extract (RED YEAST RICE PO)   Oral   Take 2 capsules by mouth 2 (two) times daily.            BP 143/73  Pulse 72  Temp(Src) 97.6 F (36.4 C) (Oral)  Resp 18  SpO2 99%  Physical Exam  Nursing note and vitals reviewed. Constitutional: She is oriented to person, place, and time. She appears well-developed and well-nourished. No distress.  HENT:  Head: Normocephalic and atraumatic.  Eyes: Conjunctivae and EOM are normal.  Cardiovascular: Normal rate and regular rhythm.   Pulmonary/Chest: Effort normal and breath sounds normal. No stridor. No respiratory distress.  Abdominal: She exhibits no distension.  Musculoskeletal: She exhibits no edema.  Neurological: She is alert and oriented to person, place, and time. She displays no atrophy and no tremor. No  cranial nerve deficit or sensory deficit. She exhibits normal muscle tone. She displays no seizure activity. Coordination normal.  Symptoms easily provoked w head turning or rapid alternating eye motion.  Skin: Skin is warm and dry.  Psychiatric: She has a normal mood and affect.    ED Course  Procedures (including critical care time)  Labs Reviewed - No data to display No results found.   No diagnosis found.  O2- 99%ra, normal  7:42 PM Patient's symptoms resolved. She is now moving her head freely, states that she feels significantly better.  We discussed return precautions, medication instructions, home care.   MDM  Patient presents with vertigo like symptoms.  On exam she is awake and alert, neurologically intact, though her symptoms are easily provoked with minor head motion.  The patient improved substantially following provision of medication, and down her vertigo, she had minimal complaints.  Given her general health, the absence of focal neuro findings, imaging was not warranted. The patient was discharged with appropriate followup instructions, return precautions        Gerhard Munch, MD 03/11/13 1943

## 2013-03-11 NOTE — ED Notes (Signed)
Pt states she started feeling very dizzy at 1345 today, pt states she feels like the room is spinning and that she is going to black out. Pt states she is also very nauseous. Pt has a hx of vertigo. Pt recently had changes to her BP medicine.

## 2013-03-11 NOTE — ED Notes (Signed)
Family at bedside. 

## 2013-03-11 NOTE — Telephone Encounter (Signed)
Patient called from work having a bad reaction to new blood pressure medication. Pt is dizzy,light headed, nauseous and cannot sit up at all. Nurse at work took blood pressure and it was 160/102. Spoke to pt briefly who was slurring her words and very hard to understand. On site nurse then picked up call and was not sure if patient should come here or go to ED. Was given the work number: [phone]

## 2013-03-11 NOTE — Telephone Encounter (Signed)
She should go to ER. Especially if she can not sit up . This is not normal, patient slurring her words. Patient agrees. She will have someone take her, or call 911, if she cant get anyone to take her.

## 2013-03-11 NOTE — Telephone Encounter (Signed)
To you FYI  

## 2013-03-11 NOTE — ED Notes (Signed)
The patient is AOx4 and comfortable with her discharge instructions.  Her daughter is driving her home.

## 2013-03-16 NOTE — Telephone Encounter (Signed)
She did go to the ER.

## 2013-03-16 NOTE — Telephone Encounter (Signed)
I called her to see how she is and to see how her BP is running. Called at work, they wanted to take message. Did not leave message. Called her home, she will be in after 6pm.

## 2013-03-16 NOTE — Telephone Encounter (Signed)
Looks like she was diagnosed with benign positional vertigo and that they did not change her BP meds.  If she is still having any problems w/ this still, should come into clinic for further eval and review manuvers for treatments.  Is her BP better or still running high? Is she still taking her new BP med?

## 2013-03-18 NOTE — Telephone Encounter (Signed)
Still unable to reach

## 2013-03-31 NOTE — Telephone Encounter (Signed)
Amy, do we still need to contact pt or did you reach her?

## 2013-03-31 NOTE — Telephone Encounter (Signed)
No, she went to the ER as instructed. Will follow up here.

## 2013-04-08 ENCOUNTER — Other Ambulatory Visit: Payer: Self-pay

## 2013-04-08 ENCOUNTER — Telehealth: Payer: Self-pay

## 2013-04-08 DIAGNOSIS — I10 Essential (primary) hypertension: Secondary | ICD-10-CM

## 2013-04-08 MED ORDER — CHLORTHALIDONE 25 MG PO TABS: 25.0000 mg | ORAL_TABLET | Freq: Every day | ORAL | Status: DC

## 2013-04-08 NOTE — Telephone Encounter (Signed)
Patient is calling about her blood pressure medication [phone]

## 2013-04-10 MED ORDER — LISINOPRIL 10 MG PO TABS: 10.0000 mg | ORAL_TABLET | Freq: Every evening | ORAL | Status: DC

## 2013-04-10 MED ORDER — CHLORTHALIDONE 25 MG PO TABS: 25.0000 mg | ORAL_TABLET | Freq: Every day | ORAL | Status: DC

## 2013-04-10 NOTE — Telephone Encounter (Signed)
Spoke with pt she needs refill on lisinopril and chlorthalidone. Please advise

## 2013-04-10 NOTE — Telephone Encounter (Signed)
Done

## 2013-04-10 NOTE — Telephone Encounter (Signed)
LMOM to CB. 

## 2013-04-27 ENCOUNTER — Other Ambulatory Visit: Payer: Self-pay | Admitting: Family Medicine

## 2013-06-12 ENCOUNTER — Ambulatory Visit (INDEPENDENT_AMBULATORY_CARE_PROVIDER_SITE_OTHER): Payer: 59 | Admitting: Family Medicine

## 2013-06-12 VITALS — BP 140/88 | HR 86 | Temp 98.9°F | Resp 15

## 2013-06-12 DIAGNOSIS — H8110 Benign paroxysmal vertigo, unspecified ear: Secondary | ICD-10-CM

## 2013-06-12 DIAGNOSIS — I10 Essential (primary) hypertension: Secondary | ICD-10-CM

## 2013-06-12 DIAGNOSIS — Z79899 Other long term (current) drug therapy: Secondary | ICD-10-CM

## 2013-06-12 LAB — POCT CBC
Granulocyte percent: 77.7 %G (ref 37–80)
HCT, POC: 37.3 % — AB (ref 37.7–47.9)
Lymph, poc: 1.7 (ref 0.6–3.4)
MCHC: 31.4 g/dL — AB (ref 31.8–35.4)
MCV: 86.2 fL (ref 80–97)
MID (cbc): 0.5 (ref 0–0.9)
POC Granulocyte: 7.6 — AB (ref 2–6.9)
POC LYMPH PERCENT: 17.6 %L (ref 10–50)
Platelet Count, POC: 246 10*3/uL (ref 142–424)
RDW, POC: 17.3 %

## 2013-06-12 LAB — COMPREHENSIVE METABOLIC PANEL
AST: 13 U/L (ref 0–37)
Albumin: 4.2 g/dL (ref 3.5–5.2)
Alkaline Phosphatase: 130 U/L — ABNORMAL HIGH (ref 39–117)
BUN: 13 mg/dL (ref 6–23)
Glucose, Bld: 98 mg/dL (ref 70–99)
Potassium: 3.7 mEq/L (ref 3.5–5.3)
Sodium: 140 mEq/L (ref 135–145)
Total Bilirubin: 0.3 mg/dL (ref 0.3–1.2)

## 2013-06-12 MED ORDER — MECLIZINE HCL 25 MG PO TABS: 25.0000 mg | ORAL_TABLET | Freq: Three times a day (TID) | ORAL | Status: DC

## 2013-06-12 MED ORDER — LISINOPRIL-HYDROCHLOROTHIAZIDE 20-12.5 MG PO TABS: 1.0000 | ORAL_TABLET | Freq: Every day | ORAL | Status: DC

## 2013-06-12 MED ORDER — PROMETHAZINE HCL 25 MG/ML IJ SOLN
25.0000 mg | Freq: Once | INTRAMUSCULAR | Status: AC
  Administered 2013-06-12: 25 mg via INTRAMUSCULAR

## 2013-06-12 NOTE — Patient Instructions (Addendum)
After the acute phase resolves and you have the meclizine (and possibly the ativan as well) on board, you can think about trying the home epley maneuvers to try to move a middle ear stone back into the correct place.  Start with the maneuvers with your head facing whichever way makes you most dizzy.  You can find lots of really great examples of how to do home epley maneuvers on YouTube.  If you have any questions about them or want me to walk you through them, call clinic or come back to clinic.  If your symptoms do not resolve with medication or with the maneuvers or come back or have any of the warning symptoms listed below, call so we can refer you to ENT.   Benign Positional Vertigo Vertigo means you feel like you or your surroundings are moving when they are not. Benign positional vertigo is the most common form of vertigo. Benign means that the cause of your condition is not serious. Benign positional vertigo is more common in older adults. CAUSES  Benign positional vertigo is the result of an upset in the labyrinth system. This is an area in the middle ear that helps control your balance. This may be caused by a viral infection, head injury, or repetitive motion. However, often no specific cause is found. SYMPTOMS  Symptoms of benign positional vertigo occur when you move your head or eyes in different directions. Some of the symptoms may include:  Loss of balance and falls.  Vomiting.  Blurred vision.  Dizziness.  Nausea.  Involuntary eye movements (nystagmus). DIAGNOSIS  Benign positional vertigo is usually diagnosed by physical exam. If the specific cause of your benign positional vertigo is unknown, your caregiver may perform imaging tests, such as magnetic resonance imaging (MRI) or computed tomography (CT). TREATMENT  Your caregiver may recommend movements or procedures to correct the benign positional vertigo. Medicines such as meclizine, benzodiazepines, and medicines for  nausea may be used to treat your symptoms. In rare cases, if your symptoms are caused by certain conditions that affect the inner ear, you may need surgery. HOME CARE INSTRUCTIONS   Follow your caregiver's instructions.  Move slowly. Do not make sudden body or head movements.  Avoid driving.  Avoid operating heavy machinery.  Avoid performing any tasks that would be dangerous to you or others during a vertigo episode.  Drink enough fluids to keep your urine clear or pale yellow. SEEK IMMEDIATE MEDICAL CARE IF:   You develop problems with walking, weakness, numbness, or using your arms, hands, or legs.  You have difficulty speaking.  You develop severe headaches.  Your nausea or vomiting continues or gets worse.  You develop visual changes.  Your family or friends notice any behavioral changes.  Your condition gets worse.  You have a fever.  You develop a stiff neck or sensitivity to light. MAKE SURE YOU:   Understand these instructions.  Will watch your condition.  Will get help right away if you are not doing well or get worse. Document Released: 07/22/2006 Document Revised: 01/06/2012 Document Reviewed: 07/04/2011 [facility] Patient Information 2014 [place], [place]. Vertigo Vertigo means you feel like you or your surroundings are moving when they are not. Vertigo can be dangerous if it occurs when you are at work, driving, or performing difficult activities.  CAUSES  Vertigo occurs when there is a conflict of signals sent to your brain from the visual and sensory systems in your body. There are many different causes of vertigo,  including:  Infections, especially in the inner ear.  A bad reaction to a drug or misuse of alcohol and medicines.  Withdrawal from drugs or alcohol.  Rapidly changing positions, such as lying down or rolling over in bed.  A migraine headache.  Decreased blood flow to the brain.  Increased pressure in the brain from a head injury,  infection, tumor, or bleeding. SYMPTOMS  You may feel as though the world is spinning around or you are falling to the ground. Because your balance is upset, vertigo can cause nausea and vomiting. You may have involuntary eye movements (nystagmus). DIAGNOSIS  Vertigo is usually diagnosed by physical exam. If the cause of your vertigo is unknown, your caregiver may perform imaging tests, such as an MRI scan (magnetic resonance imaging). TREATMENT  Most cases of vertigo resolve on their own, without treatment. Depending on the cause, your caregiver may prescribe certain medicines. If your vertigo is related to body position issues, your caregiver may recommend movements or procedures to correct the problem. In rare cases, if your vertigo is caused by certain inner ear problems, you may need surgery. HOME CARE INSTRUCTIONS   Follow your caregiver's instructions.  Avoid driving.  Avoid operating heavy machinery.  Avoid performing any tasks that would be dangerous to you or others during a vertigo episode.  Tell your caregiver if you notice that certain medicines seem to be causing your vertigo. Some of the medicines used to treat vertigo episodes can actually make them worse in some people. SEEK IMMEDIATE MEDICAL CARE IF:   Your medicines do not relieve your vertigo or are making it worse.  You develop problems with talking, walking, weakness, or using your arms, hands, or legs.  You develop severe headaches.  Your nausea or vomiting continues or gets worse.  You develop visual changes.  A family member notices behavioral changes.  Your condition gets worse. MAKE SURE YOU:  Understand these instructions.  Will watch your condition.  Will get help right away if you are not doing well or get worse. Document Released: 07/24/2005 Document Revised: 01/06/2012 Document Reviewed: 05/02/2011 [facility] Patient Information 2014 [place], [place].

## 2013-06-12 NOTE — Progress Notes (Signed)
Subjective:    Patient ID: Jacqueline Atkinson, female    DOB: [birth year], 57 y.o.   MRN: [id number] Chief Complaint  Patient presents with  . Dizziness    2nd episode x 6 weeks - seen in ED w/Antivert  . Nausea    quiziness    HPI  Seen in the ER for very severe vertigo over 3 mos ago. She was rx'ed Antivert and ativan and only had to take the medication for a few days.  Still has some ativan left but might be out of the antivert.  Few incidences since then of a little lightheaded or off balance for sev hrs since then but no real vertigo recurrence till today.  This morning she was fine, was sitting at a table at her book club and suddenly the room started shifting and spinning.   No vomiting but feeling quesy.  Room spinning when she moves head or eyes, esp worse when moving head to the right, no hearing or other vision changes, no ear pain, no cold/URI/congestion recently.  Can walk but very unsteady and has to use a cane.  BP has been doing well but ran out of the lisinopril - 128/78 on chlorthalidone alone. However, has been having more calf-cramping in the evenings.  Past Medical History  Diagnosis Date  . Allergy   . Arthritis   . Hypertension   . Anxiety    Current Outpatient Prescriptions on File Prior to Visit  Medication Sig Dispense Refill  . acetaminophen (TYLENOL) 500 MG tablet Take 1,000 mg by mouth 2 (two) times daily as needed for pain.      Marland Kitchen aspirin EC 81 MG tablet Take 81 mg by mouth daily.      . Cholecalciferol (VITAMIN D3) 5000 UNITS CAPS Take 5,000 mg by mouth every evening.      . Coenzyme Q10 (CO Q 10) 100 MG CAPS Take 100 mg by mouth daily.       . Flaxseed, Linseed, 1300 MG CAPS Take 1,300 mg by mouth 2 (two) times daily.       . indomethacin (INDOCIN) 25 MG capsule Take 25-50 mg by mouth 2 (two) times daily with a meal. 2 tablets (50 mg) every morning and 1 tablet (25 mg) every evening      . Omega-3 Fatty Acids (FISH OIL PO) Take 1 capsule by mouth 2 (two) times  daily.      Marland Kitchen omeprazole (PRILOSEC) 40 MG capsule Take 40 mg by mouth daily with supper.      . Red Yeast Rice Extract (RED YEAST RICE PO) Take 2 capsules by mouth 2 (two) times daily.       . butalbital-acetaminophen-caffeine (FIORICET, ESGIC) 50-325-40 MG per tablet TAKE ONE TO TWO TABLETS BY MOUTH EVERY 6 HOURS AS NEEDED FOR HEADACHE  20 tablet  0  . LORazepam (ATIVAN) 1 MG tablet Take 1 tablet (1 mg total) by mouth every 6 (six) hours as needed (Vertigo).  15 tablet  0  . NIACIN PO Take 62.5 mg by mouth 2 (two) times daily. 1/2 tablet 62.5 mg (tablet is 125 mg)       No current facility-administered medications on file prior to visit.   Allergies  Allergen Reactions  . Sulfa Antibiotics     Long time ago and does not remember reaction     Review of Systems  Constitutional: Negative for fever, chills, diaphoresis, appetite change and unexpected weight change.  HENT: Negative for hearing loss, ear pain, congestion,  rhinorrhea, neck pain, neck stiffness, sinus pressure, tinnitus and ear discharge.   Eyes: Negative for pain, discharge and visual disturbance.  Gastrointestinal: Positive for nausea. Negative for vomiting.  Musculoskeletal: Positive for gait problem.  Skin: Negative for rash.  Neurological: Positive for dizziness, weakness and light-headedness. Negative for tremors, seizures, syncope, facial asymmetry, speech difficulty, numbness and headaches.  Psychiatric/Behavioral: Negative for sleep disturbance.      BP 140/88  Pulse 86  Temp(Src) 98.9 F (37.2 C) (Oral)  Resp 15  SpO2 98% Objective:   Physical Exam  Constitutional: She is oriented to person, place, and time. She appears well-developed and well-nourished. She appears lethargic. She does not appear ill. She appears distressed.  HENT:  Head: Normocephalic and atraumatic.  Right Ear: External ear and ear canal normal. Tympanic membrane is retracted. No middle ear effusion.  Left Ear: Tympanic membrane, external  ear and ear canal normal. Tympanic membrane is not retracted.  No middle ear effusion.  Nose: No mucosal edema or rhinorrhea.  Mouth/Throat: Uvula is midline and mucous membranes are normal. No oropharyngeal exudate, posterior oropharyngeal edema, posterior oropharyngeal erythema or tonsillar abscesses.  Eyes: Conjunctivae are normal. Right eye exhibits no discharge. Left eye exhibits no discharge. No scleral icterus.  Neck: Normal range of motion. Neck supple.  Cardiovascular: Normal rate, regular rhythm, normal heart sounds and intact distal pulses.   Pulmonary/Chest: Effort normal and breath sounds normal.  Lymphadenopathy:       Head (right side): No submandibular, no preauricular and no posterior auricular adenopathy present.       Head (left side): No submandibular, no preauricular and no posterior auricular adenopathy present.    She has no cervical adenopathy.       Right: No supraclavicular adenopathy present.       Left: No supraclavicular adenopathy present.  Neurological: She is oriented to person, place, and time. She appears lethargic. She displays no atrophy and no tremor. No sensory deficit. She exhibits normal muscle tone. Coordination and gait abnormal.  Skin: Skin is warm and dry. She is not diaphoretic. No erythema.  Psychiatric: She has a normal mood and affect. Her behavior is normal.      Results for orders placed in visit on 06/12/13  POCT CBC      Result Value Range   WBC 9.8  4.6 - 10.2 K/uL   Lymph, poc 1.7  0.6 - 3.4   POC LYMPH PERCENT 17.6  10 - 50 %L   MID (cbc) 0.5  0 - 0.9   POC MID % 4.7  0 - 12 %M   POC Granulocyte 7.6 (*) 2 - 6.9   Granulocyte percent 77.7  37 - 80 %G   RBC 4.33  4.04 - 5.48 M/uL   Hemoglobin 11.7 (*) 12.2 - 16.2 g/dL   HCT, POC 16.1 (*) 09.6 - 47.9 %   MCV 86.2  80 - 97 fL   MCH, POC 27.0  27 - 31.2 pg   MCHC 31.4 (*) 31.8 - 35.4 g/dL   RDW, POC 04.5     Platelet Count, POC 246  142 - 424 K/uL   MPV 11.1  0 - 99.8 fL     Assessment & Plan:  Encounter for long-term (current) use of other medications - Plan: Comprehensive metabolic panel  Benign paroxysmal positional vertigo - Plan: POCT CBC, promethazine (PHENERGAN) injection 25 mg IM x 1 now- to dizzy to review epley manuvers in office. Can try Epley maneuvers at home after  meclizine is on board - hand-out given or YouTube Epley maneuvers for right ear. Call or RTC if confusion. If sxs don't resolve or worsen, refer to ENT.  HTN - concern the chlorthalidone could be exacerbating the vertigo due to dehydration (told she was dehydrated in the er) and recent calf-cramping.  D/c and start lisinopril-hctz alternatively which she did well on in the past - was previously on lisinopril 10 but BP not ideally controlled on this.  Meds ordered this encounter  Medications  . meclizine (ANTIVERT) 25 MG tablet    Sig: Take 1 tablet (25 mg total) by mouth 3 (three) times daily.    Dispense:  21 tablet    Refill:  0  . promethazine (PHENERGAN) injection 25 mg    Sig:   . lisinopril-hydrochlorothiazide (ZESTORETIC) 20-12.5 MG per tablet    Sig: Take 1 tablet by mouth daily.    Dispense:  90 tablet    Refill:  0

## 2013-06-21 ENCOUNTER — Telehealth: Payer: Self-pay

## 2013-06-21 DIAGNOSIS — H8110 Benign paroxysmal vertigo, unspecified ear: Secondary | ICD-10-CM

## 2013-06-21 NOTE — Telephone Encounter (Signed)
Dr Clelia Croft: Patient states that she is still having issues with vertigo and would like to have that appointment with an ENT Doctor.  Please contact patient about the referral.   Best#: [phone]

## 2013-06-21 NOTE — Telephone Encounter (Signed)
Left message for her to call me back. 

## 2013-06-21 NOTE — Telephone Encounter (Signed)
Referral to ENT placed.  In the meantime, if she is still having problems, I would be happy to refill her meclizine as needed and if she wants to come back in so we can walk her through the Epley maneuvers and make sure she is doing them correctly, that may help.

## 2013-07-17 NOTE — Telephone Encounter (Signed)
Pt had appt w/ENT on 06/25/13.

## 2013-10-17 ENCOUNTER — Other Ambulatory Visit: Payer: Self-pay | Admitting: Family Medicine

## 2013-10-29 ENCOUNTER — Ambulatory Visit (INDEPENDENT_AMBULATORY_CARE_PROVIDER_SITE_OTHER): Payer: 59 | Admitting: Emergency Medicine

## 2013-10-29 VITALS — BP 132/84 | HR 102 | Temp 98.3°F | Resp 18 | Ht 67.0 in | Wt 216.0 lb

## 2013-10-29 DIAGNOSIS — J111 Influenza due to unidentified influenza virus with other respiratory manifestations: Secondary | ICD-10-CM

## 2013-10-29 MED ORDER — OSELTAMIVIR PHOSPHATE 75 MG PO CAPS: 75.0000 mg | ORAL_CAPSULE | Freq: Two times a day (BID) | ORAL | Status: DC

## 2013-10-29 NOTE — Progress Notes (Signed)
[facility] [address] [place] [address] [phone]  Date:  10/29/2013   Name:  Iyanah Demont   DOB:  24-Feb-1956   MRN:  [id number]  PCP:  [name], MD    Chief Complaint: Sore Throat, Generalized Body Aches and Fatigue   History of Present Illness:  Kema Santaella is a 58 y.o. very pleasant female patient who presents with the following:  Ill suddenly today with malaise and myalgias.  Scant cough.  No fever or chills.  No wheezing or shortness of breath.  Has a sore throat.  No nausea or vomiting.  No rash or stool change.  No improvement with over the counter medications or other home remedies. Denies other complaint or health concern today.   Patient Active Problem List   Diagnosis Date Noted  . Knee pain 12/19/2012  . Paresthesia 12/19/2012  . Hypertension 11/28/2012  . Encounter for long-term (current) use of other medications 11/28/2012  . GERD (gastroesophageal reflux disease) 11/28/2012    Past Medical History  Diagnosis Date  . Allergy   . Arthritis   . Hypertension   . Anxiety     Past Surgical History  Procedure Laterality Date  . Foot surgery  2011    3rd metatarsal LT foot    History  Substance Use Topics  . Smoking status: Former Smoker    Quit date: 03/14/1988  . Smokeless tobacco: Never Used  . Alcohol Use: No    Family History  Problem Relation Age of Onset  . Cancer Mother   . Diabetes Mother   . Kidney disease Mother   . COPD Father   . Brain cancer Brother     Allergies  Allergen Reactions  . Sulfa Antibiotics     Long time ago and does not remember reaction    Medication list has been reviewed and updated.  Current Outpatient Prescriptions on File Prior to Visit  Medication Sig Dispense Refill  . acetaminophen (TYLENOL) 500 MG tablet Take 1,000 mg by mouth 2 (two) times daily as needed for pain.      . Cholecalciferol (VITAMIN D3) 5000 UNITS CAPS Take 5,000 mg by mouth every evening.      . Coenzyme  Q10 (CO Q 10) 100 MG CAPS Take 100 mg by mouth daily.       . Flaxseed, Linseed, 1300 MG CAPS Take 1,300 mg by mouth 2 (two) times daily.       . indomethacin (INDOCIN) 25 MG capsule Take 25-50 mg by mouth 2 (two) times daily with a meal. 2 tablets (50 mg) every morning and 1 tablet (25 mg) every evening      . lisinopril-hydrochlorothiazide (PRINZIDE,ZESTORETIC) 20-12.5 MG per tablet TAKE ONE TABLET BY MOUTH ONCE DAILY  90 tablet  0  . Omega-3 Fatty Acids (FISH OIL PO) Take 1 capsule by mouth 2 (two) times daily.      . Red Yeast Rice Extract (RED YEAST RICE PO) Take 2 capsules by mouth 2 (two) times daily.       Marland Kitchen aspirin EC 81 MG tablet Take 81 mg by mouth daily.      . butalbital-acetaminophen-caffeine (FIORICET, ESGIC) 50-325-40 MG per tablet TAKE ONE TO TWO TABLETS BY MOUTH EVERY 6 HOURS AS NEEDED FOR HEADACHE  20 tablet  0  . LORazepam (ATIVAN) 1 MG tablet Take 1 tablet (1 mg total) by mouth every 6 (six) hours as needed (Vertigo).  15 tablet  0  . meclizine (ANTIVERT)  25 MG tablet Take 1 tablet (25 mg total) by mouth 3 (three) times daily.  21 tablet  0  . NIACIN PO Take 62.5 mg by mouth 2 (two) times daily. 1/2 tablet 62.5 mg (tablet is 125 mg)      . omeprazole (PRILOSEC) 40 MG capsule Take 40 mg by mouth daily with supper.       No current facility-administered medications on file prior to visit.    Review of Systems:  As per HPI, otherwise negative.    Physical Examination: Filed Vitals:   10/29/13 1452  BP: 132/84  Pulse: 102  Temp: 98.3 F (36.8 C)  Resp: 18   Filed Vitals:   10/29/13 1452  Height: 5\' 7"  (1.702 m)  Weight: 216 lb (97.977 kg)   Body mass index is 33.82 kg/(m^2). Ideal Body Weight: Weight in (lb) to have BMI = 25: 159.3  GEN: WDWN, NAD, Non-toxic, A & O x 3 HEENT: Atraumatic, Normocephalic. Neck supple. No masses, No LAD. Ears and Nose: No external deformity. CV: RRR, No M/G/R. No JVD. No thrill. No extra heart sounds. PULM: CTA B, no wheezes,  crackles, rhonchi. No retractions. No resp. distress. No accessory muscle use. ABD: S, NT, ND, +BS. No rebound. No HSM. EXTR: No c/c/e NEURO Normal gait.  PSYCH: Normally interactive. Conversant. Not depressed or anxious appearing.  Calm demeanor.    Assessment and Plan: Influenza tamiflu  Signed,  Ellison Carwin, MD

## 2013-10-29 NOTE — Patient Instructions (Signed)

## 2013-11-09 ENCOUNTER — Ambulatory Visit (INDEPENDENT_AMBULATORY_CARE_PROVIDER_SITE_OTHER): Payer: 59 | Admitting: Emergency Medicine

## 2013-11-09 ENCOUNTER — Ambulatory Visit
Admission: RE | Admit: 2013-11-09 | Discharge: 2013-11-09 | Disposition: A | Discharge status: Home / Self Care | Payer: 59 | Source: Ambulatory Visit | Attending: Emergency Medicine | Admitting: Emergency Medicine

## 2013-11-09 ENCOUNTER — Other Ambulatory Visit: Payer: 59

## 2013-11-09 VITALS — BP 170/100 | HR 81 | Temp 99.3°F | Resp 17 | Ht 67.0 in | Wt 216.0 lb

## 2013-11-09 DIAGNOSIS — R51 Headache: Secondary | ICD-10-CM

## 2013-11-09 DIAGNOSIS — I10 Essential (primary) hypertension: Secondary | ICD-10-CM

## 2013-11-09 MED ORDER — LISINOPRIL-HYDROCHLOROTHIAZIDE 10-12.5 MG PO TABS: 1.0000 | ORAL_TABLET | Freq: Every day | ORAL | Status: DC

## 2013-11-09 NOTE — Progress Notes (Signed)
[facility] [address], [place] [address] [phone]  Date:  11/09/2013   Name:  Jacqueline Atkinson   DOB:  1956-07-21   MRN:  [id number]  PCP:  [name], MD    Chief Complaint: Hypertension   History of Present Illness:  Jacqueline Atkinson is a 58 y.o. very pleasant female patient who presents with the following:  Developed a severe occipital headache this morning at 1100.  Had a headache all week related to flu.  No neuro or visual symptoms.  No cough or coryza.  No fever or chills. Says she is chronically unsteady on her feet and has balance trouble related to pain in knees.  No nausea or vomiting.  No stool change or rash.  Has not missed any doses of antihypertensive.  No improvement with over the counter medications or other home remedies. Denies other complaint or health concern today.   Patient Active Problem List   Diagnosis Date Noted  . Knee pain 12/19/2012  . Paresthesia 12/19/2012  . Hypertension 11/28/2012  . Encounter for long-term (current) use of other medications 11/28/2012  . GERD (gastroesophageal reflux disease) 11/28/2012    Past Medical History  Diagnosis Date  . Allergy   . Arthritis   . Hypertension   . Anxiety     Past Surgical History  Procedure Laterality Date  . Foot surgery  2011    3rd metatarsal LT foot    History  Substance Use Topics  . Smoking status: Former Smoker    Quit date: 03/14/1988  . Smokeless tobacco: Never Used  . Alcohol Use: No    Family History  Problem Relation Age of Onset  . Cancer Mother   . Diabetes Mother   . Kidney disease Mother   . COPD Father   . Brain cancer Brother     Allergies  Allergen Reactions  . Sulfa Antibiotics     Long time ago and does not remember reaction    Medication list has been reviewed and updated.  Current Outpatient Prescriptions on File Prior to Visit  Medication Sig Dispense Refill  . acetaminophen (TYLENOL) 500 MG tablet Take 1,000 mg by mouth 2 (two)  times daily as needed for pain.      . Cholecalciferol (VITAMIN D3) 5000 UNITS CAPS Take 5,000 mg by mouth every evening.      . Coenzyme Q10 (CO Q 10) 100 MG CAPS Take 100 mg by mouth daily.       . indomethacin (INDOCIN) 25 MG capsule Take 25-50 mg by mouth 2 (two) times daily with a meal. 2 tablets (50 mg) every morning and 1 tablet (25 mg) every evening      . lisinopril-hydrochlorothiazide (PRINZIDE,ZESTORETIC) 20-12.5 MG per tablet TAKE ONE TABLET BY MOUTH ONCE DAILY  90 tablet  0  . LORazepam (ATIVAN) 1 MG tablet Take 1 tablet (1 mg total) by mouth every 6 (six) hours as needed (Vertigo).  15 tablet  0  . meclizine (ANTIVERT) 25 MG tablet Take 1 tablet (25 mg total) by mouth 3 (three) times daily.  21 tablet  0  . NIACIN PO Take 62.5 mg by mouth 2 (two) times daily. 1/2 tablet 62.5 mg (tablet is 125 mg)      . omeprazole (PRILOSEC) 40 MG capsule Take 40 mg by mouth daily with supper.       No current facility-administered medications on file prior to visit.    Review of Systems:  As per HPI,  otherwise negative.    Physical Examination: Filed Vitals:   11/09/13 1450  BP: 170/100  Pulse: 81  Temp: 99.3 F (37.4 C)  Resp: 17   Filed Vitals:   11/09/13 1450  Height: 5\' 7"  (1.702 m)  Weight: 216 lb (97.977 kg)   Body mass index is 33.82 kg/(m^2). Ideal Body Weight: Weight in (lb) to have BMI = 25: 159.3  GEN: WDWN, NAD, Non-toxic, A & O x 3 HEENT: Atraumatic, Normocephalic. Neck supple. No masses, No LAD.  PRRERLA EOMI  Ears and Nose: No external deformity. CV: RRR, No M/G/R. No JVD. No thrill. No extra heart sounds. PULM: CTA B, no wheezes, crackles, rhonchi. No retractions. No resp. distress. No accessory muscle use. ABD: S, NT, ND, +BS. No rebound. No HSM. EXTR: No c/c/e NEURO Normal gait.  Impaired tandem gait and romberg.   PSYCH: Normally interactive. Conversant. Not depressed or anxious appearing.  Calm demeanor.    Assessment and Plan: Atypical severe  headache Hypertension Increase lisinopril HCTZ Follow up in 2 weeks to check BP   Signed,  Ellison Carwin, MD

## 2013-11-09 NOTE — Patient Instructions (Signed)
Amorita Ph# [phone]. We ill call you with the resutls

## 2013-11-21 ENCOUNTER — Ambulatory Visit
Admission: RE | Admit: 2013-11-21 | Discharge: 2013-11-21 | Disposition: A | Discharge status: Home / Self Care | Payer: 59 | Source: Ambulatory Visit | Attending: Emergency Medicine | Admitting: Emergency Medicine

## 2013-11-21 DIAGNOSIS — I10 Essential (primary) hypertension: Secondary | ICD-10-CM

## 2013-11-21 DIAGNOSIS — R51 Headache: Secondary | ICD-10-CM

## 2013-11-22 ENCOUNTER — Other Ambulatory Visit: Payer: Self-pay | Admitting: Emergency Medicine

## 2013-11-22 DIAGNOSIS — D329 Benign neoplasm of meninges, unspecified: Secondary | ICD-10-CM

## 2013-12-18 ENCOUNTER — Other Ambulatory Visit: Payer: Self-pay | Admitting: Emergency Medicine

## 2013-12-18 ENCOUNTER — Other Ambulatory Visit: Payer: Self-pay | Admitting: Family Medicine

## 2013-12-29 ENCOUNTER — Ambulatory Visit (INDEPENDENT_AMBULATORY_CARE_PROVIDER_SITE_OTHER): Payer: 59 | Admitting: Neurology

## 2013-12-29 ENCOUNTER — Encounter: Payer: Self-pay | Admitting: Neurology

## 2013-12-29 VITALS — BP 143/89 | HR 74 | Ht 66.0 in | Wt 212.0 lb

## 2013-12-29 DIAGNOSIS — M549 Dorsalgia, unspecified: Secondary | ICD-10-CM | POA: Insufficient documentation

## 2013-12-29 DIAGNOSIS — R269 Unspecified abnormalities of gait and mobility: Secondary | ICD-10-CM

## 2013-12-29 NOTE — Progress Notes (Signed)
PATIENT: Jacqueline Atkinson DOB: February 16, 1956  HISTORICAL  Jacqueline Atkinson is a 58 yo RH white female was referred by Neurosurgeon Dr. 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 Amil Amen, she has a regular knee cortisone shots, each time has the fluid taken out, which only help her walking temporarily,   Her gait difficulty has gradually getting worse, she tends to veer to her right side, over the past few months, she also lost extremity of a right hand, tends to use her left hand, difficulty to sign her name using her right hand,   She has mild numbness tingling in her toes, and area of her feet, over past few weeks, she also developed urinary urgency, incontinence, occasionally bowel accident,   She also had a few month history of upper back pain, getting worse over the past few weeks, feels like it is penetrating her from back to front.  In January she developed episode of acute onset severe headaches, this led to a CAT scan, later MRI of the brain, with without contrast, which showed a left anterior fossa meningioma, 1.7 cm, with no surrounding edema, Partially empty sella and prominent peri optic spaces noted.    MR angiogram: No abnormal flow seen within the anterior left middle cranial fossa structure which has an appearance most consistent with a meningioma  rather than aneurysm.No large vessel disease   MRI cervical  which was done in February 2015 and [facility] imaging, has multilevel mild degenerative disc disease, most severe at C4-5,  5-6 level, but there was no significant foraminal or canal stenosis.  There was no family history of similar gait disorder, she has 7 children, none of them has the gait disorder  REVIEW OF SYSTEMS: Full 14 system review of systems performed and notable only for many years, joint pain, joint swelling, cramps, achy muscles, headaches, weakness, decreased energy  ALLERGIES: Allergies  Allergen Reactions  . Sulfa Antibiotics     Long time ago and does not remember reaction    HOME MEDICATIONS: Current Outpatient Prescriptions on File Prior to Visit  Medication Sig Dispense Refill  . acetaminophen (TYLENOL) 500 MG tablet Take 1,000 mg by mouth 2 (two) times daily as needed for pain.      . Cholecalciferol (VITAMIN D3) 5000 UNITS CAPS Take 5,000 mg by mouth every evening.      . Coenzyme Q10 (CO Q 10) 100 MG CAPS Take 100 mg by mouth daily.       . indomethacin (INDOCIN) 25 MG capsule Take 25-50 mg by mouth 2 (two) times daily with a meal. 2 tablets (50 mg) every morning and 1 tablet (25 mg) every evening      . lisinopril-hydrochlorothiazide (PRINZIDE,ZESTORETIC) 10-12.5 MG per tablet Take 1 tablet by mouth daily. PATIENT NEED BP RECHECK VISIT FOR ADDITIONAL REFILLS  30 tablet  0  . lisinopril-hydrochlorothiazide (PRINZIDE,ZESTORETIC) 20-12.5 MG per tablet Take 1 tablet by mouth daily. PATIENT BP RECHECK VISIT FOR ADDITIONAL REFILLS  30 tablet  0   No current facility-administered medications on file prior  to visit.    PAST MEDICAL HISTORY: Past Medical History  Diagnosis Date  . Allergy   . Arthritis   . Hypertension   . Anxiety     PAST SURGICAL HISTORY: Past Surgical History  Procedure Laterality Date  . Foot surgery  2011    3rd metatarsal LT foot  . Tonsillectomy and adenoidectomy      FAMILY HISTORY: Family History  Problem Relation Age of Onset  . Cancer Mother   . Diabetes Mother   . Kidney disease Mother   . COPD Father   . Brain cancer Brother     SOCIAL  HISTORY:  History   Social History  . Marital Status: Divorced    Spouse Name: N/A    Number of Children: 7  . Years of Education: 12   Occupational History  . ACTIVITY COORDINATOR, lead activity    Social History Main Topics  . Smoking status: Former Smoker    Quit date: 03/14/1988  . Smokeless tobacco: Never Used  . Alcohol Use: No  . Drug Use: No  . Sexual Activity: Not on file   Other Topics Concern  . Not on file   Social History Narrative  . No narrative on file   PHYSICAL EXAM   Filed Vitals:   12/29/13 0807  BP: 143/89  Pulse: 74  Height: 5\' 6"  (1.676 m)  Weight: 212 lb (96.163 kg)   Body mass index is 34.23 kg/(m^2).   Generalized: In no acute distress  Neck: Supple, no carotid bruits   Cardiac: Regular rate rhythm  Pulmonary: Clear to auscultation bilaterally  Musculoskeletal: No deformity  Neurological examination  Mentation: Alert oriented to time, place, history taking, and causual conversation  Cranial nerve II-XII: Pupils were equal round reactive to light. Extraocular movements were full.  Visual field were full on confrontational test. Bilateral fundi were sharp.  Facial sensation and strength were normal. Hearing was intact to finger rubbing bilaterally. Uvula tongue midline.  Head turning and shoulder shrug and were normal and symmetric.Tongue protrusion into cheek strength was normal.  Motor: She has no significant weakness of both upper and lower extremities, she has moderate rigidity of right upper extremity, mild rigidity of bilateral lower extremity, flatfeet bilaterally, mild deformity of left foot.  Sensory: Intact to fine touch, pinprick, preserved vibratory sensation, and proprioception at toes.  Coordination: Normal finger to nose, heel-to-shin bilaterally there was no truncal ataxia  Gait: Rising up from seated position multiple attempts, pushing on  chair arm, stiff, cautious, mildly unsteady gait, Viator right  side  Romberg signs: Negative  Deep tendon reflexes: Brachioradialis 3/3, biceps 3/3, triceps 2/2, patellar 3/3, Achilles 2/2, plantar responses were  extensor  bilaterally.She also has a left Hoffman sign   DIAGNOSTIC DATA (LABS, IMAGING, TESTING) - I reviewed patient records, labs, notes, testing and imaging myself where available.  Lab Results  Component Value Date   WBC 9.8 06/12/2013   HGB 11.7* 06/12/2013   HCT 37.3* 06/12/2013   MCV 86.2 06/12/2013   PLT 249 03/05/2013      Component Value Date/Time   NA 140 06/12/2013 1322   K 3.7 06/12/2013 1322   CL 102 06/12/2013 1322   CO2 29 06/12/2013 1322   GLUCOSE 98 06/12/2013 1322   BUN 13 06/12/2013 1322   CREATININE 0.74 06/12/2013 1322   CREATININE 0.60 07/28/2011 1200   CALCIUM 9.3 06/12/2013 1322   PROT 6.8 06/12/2013 1322   ALBUMIN 4.2 06/12/2013 1322   AST 13  06/12/2013 1322   ALT 15 06/12/2013 1322   ALKPHOS 130* 06/12/2013 1322   BILITOT 0.3 06/12/2013 1322   GFRNONAA >60 07/28/2011 1200   GFRAA >60 07/28/2011 1200   Lab Results  Component Value Date   CHOL 194 03/05/2013   HDL 52 03/05/2013   LDLCALC 112* 03/05/2013   TRIG 148 03/05/2013   CHOLHDL 3.7 03/05/2013    Lab Results  Component Value Date   TSH 0.649 11/27/2012     ASSESSMENT AND PLAN  Jacqueline Atkinson is a 58 y.o. female  with gradual onset gait difficulties, hyperreflexia of both upper and lower extremities, right upper extremity rigidity, bilateral Babinski signs, left Hoffman signs,  1.  suggested the central nervous system etiology, localize the lesion to the brain, cervical spine, or thoracic spine, but there was only incidental finding of left anterior mid fossa meningioma, which will not explained her current symptoms,  etiology could be central nervous system degenerative disorder, nutritional deficiency, paraneoplastic syndrome, infectious etiology 2. Laboratory evaluation 3 MRI of thoracic and lumbar spine 4 EMG nerve conduction study.  5 physical therapy .          Marcial Pacas, M.D. Ph.D.  [facility] [address], [place], [place] [address] [phone]

## 2013-12-30 LAB — COMPREHENSIVE METABOLIC PANEL
ALT: 14 IU/L (ref 0–32)
AST: 17 IU/L (ref 0–40)
Albumin/Globulin Ratio: 1.8 (ref 1.1–2.5)
Albumin: 4.5 g/dL (ref 3.5–5.5)
Alkaline Phosphatase: 155 IU/L — ABNORMAL HIGH (ref 39–117)
BUN/Creatinine Ratio: 19 (ref 9–23)
BUN: 14 mg/dL (ref 6–24)
CALCIUM: 9.4 mg/dL (ref 8.7–10.2)
CO2: 23 mmol/L (ref 18–29)
Chloride: 98 mmol/L (ref 97–108)
Creatinine, Ser: 0.75 mg/dL (ref 0.57–1.00)
GFR calc Af Amer: 102 mL/min/{1.73_m2} (ref >59)
GFR calc non Af Amer: 89 mL/min/{1.73_m2} (ref >59)
Globulin, Total: 2.5 g/dL (ref 1.5–4.5)
Glucose: 104 mg/dL — ABNORMAL HIGH (ref 65–99)
POTASSIUM: 4.6 mmol/L (ref 3.5–5.2)
SODIUM: 142 mmol/L (ref 134–144)
Total Bilirubin: <0.2 mg/dL (ref 0.0–1.2)
Total Protein: 7 g/dL (ref 6.0–8.5)

## 2013-12-30 LAB — COPPER, SERUM: COPPER: 141 ug/dL (ref 72–166)

## 2013-12-30 LAB — FOLATE: Folate: 10.2 ng/mL (ref >3.0)

## 2013-12-30 LAB — LIPID PANEL
CHOLESTEROL TOTAL: 212 mg/dL — AB (ref 100–199)
Chol/HDL Ratio: 4.4 ratio units (ref 0.0–4.4)
HDL: 48 mg/dL (ref >39)
LDL Calculated: 140 mg/dL — ABNORMAL HIGH (ref 0–99)
Triglycerides: 120 mg/dL (ref 0–149)
VLDL Cholesterol Cal: 24 mg/dL (ref 5–40)

## 2013-12-30 LAB — THYROID PANEL WITH TSH
Free Thyroxine Index: 1.5 (ref 1.2–4.9)
T3 UPTAKE RATIO: 27 % (ref 24–39)
T4 TOTAL: 5.7 ug/dL (ref 4.5–12.0)
TSH: 0.951 u[IU]/mL (ref 0.450–4.500)

## 2013-12-30 LAB — HIV ANTIBODY (ROUTINE TESTING W REFLEX)
HIV 1/HIV 2 AB: NONREACTIVE
HIV 1/O/2 Abs-Index Value: <1 (ref <1.00)

## 2013-12-30 LAB — C-REACTIVE PROTEIN: CRP: 21.4 mg/L — ABNORMAL HIGH (ref 0.0–4.9)

## 2013-12-30 LAB — SEDIMENTATION RATE: SED RATE: 61 mm/h — AB (ref 0–40)

## 2013-12-30 LAB — ANA W/REFLEX IF POSITIVE: Anti Nuclear Antibody(ANA): NEGATIVE

## 2013-12-30 LAB — RPR: SYPHILIS RPR SCR: NONREACTIVE

## 2013-12-30 LAB — CK: CK TOTAL: 51 U/L (ref 24–173)

## 2013-12-30 LAB — VITAMIN B12: Vitamin B-12: 368 pg/mL (ref 211–946)

## 2014-01-06 ENCOUNTER — Encounter (INDEPENDENT_AMBULATORY_CARE_PROVIDER_SITE_OTHER): Payer: PRIVATE HEALTH INSURANCE

## 2014-01-06 ENCOUNTER — Ambulatory Visit (INDEPENDENT_AMBULATORY_CARE_PROVIDER_SITE_OTHER): Payer: 59 | Admitting: Neurology

## 2014-01-06 DIAGNOSIS — M549 Dorsalgia, unspecified: Secondary | ICD-10-CM

## 2014-01-06 DIAGNOSIS — R269 Unspecified abnormalities of gait and mobility: Secondary | ICD-10-CM

## 2014-01-06 DIAGNOSIS — Z0289 Encounter for other administrative examinations: Secondary | ICD-10-CM

## 2014-01-06 DIAGNOSIS — R202 Paresthesia of skin: Secondary | ICD-10-CM

## 2014-01-06 NOTE — Procedures (Signed)
   NCS (NERVE CONDUCTION STUDY) WITH EMG (ELECTROMYOGRAPHY) REPORT   STUDY DATE: March 12th 2015 PATIENT NAME: Jacqueline Atkinson DOB: 1956/08/26 MRN: [id number]    TECHNOLOGIST: Laretta Alstrom ELECTROMYOGRAPHER: Marcial Pacas M.D.  CLINICAL INFORMATION:    58 years old female, complains of gradual onset gait difficulty, she is 2011, also complains of recent onset right arm difficulty, mild bilateral feet paresthesia. hyperreflexia on examination.  FINDINGS: NERVE CONDUCTION STUDY: Bilateral peroneal sensory responses were normal. Bilateral peroneal to EDB, and tibial motor responses were normal. Bilateral tibial H. reflexes were normal and symmetric.   Right median, ulnar sensory and motor responses were normal.  NEEDLE ELECTROMYOGRAPHY: Selected needle examination was performed at right upper, lower extremity muscles and right cervical, lumbar paraspinal muscles.  Needle examination of right tibialis anterior, Tibialis posterior, vastus lateralis, medial gastrocnemius, biceps femoris long head was normal.  Needle examination of right extensor digitorum communis, biceps, triceps, deltoid was normal.  There was no spontaneous activity at right cervical paraspinal muscles, right C5, 6, 7.  There was no spontaneous activity at right lumbosacral paraspinal muscles, right L4, 5, S1  IMPRESSION:   This is a normal study. There was no electrodiagnostic evidence of large fiber peripheral neuropathy, right cervical radiculopathy or right lumbosacral radiculopathy.   INTERPRETING PHYSICIAN:   Marcial Pacas M.D. Ph.D. [facility] [address], [place], [place] [address] [phone]

## 2014-01-12 ENCOUNTER — Ambulatory Visit (INDEPENDENT_AMBULATORY_CARE_PROVIDER_SITE_OTHER): Payer: 59

## 2014-01-12 ENCOUNTER — Other Ambulatory Visit: Payer: Self-pay | Admitting: Neurology

## 2014-01-12 DIAGNOSIS — R269 Unspecified abnormalities of gait and mobility: Secondary | ICD-10-CM

## 2014-01-12 DIAGNOSIS — M549 Dorsalgia, unspecified: Secondary | ICD-10-CM

## 2014-01-17 ENCOUNTER — Encounter: Payer: Self-pay | Admitting: Neurology

## 2014-01-17 ENCOUNTER — Ambulatory Visit (INDEPENDENT_AMBULATORY_CARE_PROVIDER_SITE_OTHER): Payer: 59 | Admitting: Neurology

## 2014-01-17 VITALS — BP 128/85 | HR 72 | Ht 66.0 in | Wt 212.0 lb

## 2014-01-17 DIAGNOSIS — M25569 Pain in unspecified knee: Secondary | ICD-10-CM

## 2014-01-17 DIAGNOSIS — R202 Paresthesia of skin: Secondary | ICD-10-CM

## 2014-01-17 DIAGNOSIS — R209 Unspecified disturbances of skin sensation: Secondary | ICD-10-CM

## 2014-01-17 DIAGNOSIS — G20 Parkinson's disease: Secondary | ICD-10-CM

## 2014-01-17 DIAGNOSIS — R269 Unspecified abnormalities of gait and mobility: Secondary | ICD-10-CM

## 2014-01-17 DIAGNOSIS — M549 Dorsalgia, unspecified: Secondary | ICD-10-CM

## 2014-01-17 MED ORDER — CARBIDOPA-LEVODOPA ER 25-100 MG PO TBCR
1.0000 | EXTENDED_RELEASE_TABLET | Freq: Two times a day (BID) | ORAL | Status: DC
Start: 2014-01-17 — End: 2014-03-22

## 2014-01-17 MED ORDER — CARBIDOPA-LEVODOPA 25-100 MG PO TABS: 2.0000 | ORAL_TABLET | Freq: Three times a day (TID) | ORAL | Status: DC

## 2014-01-17 NOTE — Progress Notes (Signed)
PATIENT: Jacqueline Atkinson DOB: 1955-12-30  HISTORICAL  Jacqueline Atkinson is a 58 yo RH white female was referred by Neurosurgeon Dr. 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 Amil Amen, she has a regular knee cortisone shots, each time has the fluid taken out, which only help her walking temporarily,   Her gait difficulty has gradually getting worse, she tends to veer to her right side, over the past few months, she also lost extremity of a right hand, tends to use her left hand, difficulty to sign her name using her right hand,   She has mild numbness tingling in her toes, and ball area of her feet, over past few weeks, since Dec 2014, she also developed urinary urgency, incontinence, occasionally bowel accident,   She also had a few month history of upper back pain, getting worse over the past few weeks, feels like it is penetrating her from back to front.  In January 2015, she developed episode of acute onset severe headaches, this led to a CAT scan, later MRI of the brain, with without contrast, which showed a left anterior fossa meningioma, 1.7 cm, with no surrounding edema, Partially empty sella and prominent peri optic spaces noted.    MR angiogram: No abnormal flow seen within the anterior left middle cranial fossa structure which has an appearance most consistent with a meningioma  rather than aneurysm.No large vessel disease   MRI cervical  which was done in February 2015 and [facility] imaging, has multilevel mild degenerative disc  disease, most severe at C4-5, 5-6 level, but there was no significant foraminal or canal stenosis.  There was no family history of similar gait disorder, she has 7 children, none of them has the gait disorder  UPDATE January 17 2014: She had worsening gait difficulty, over the past 3 months, since January 2015, she also noticed right hand difficulty, there is differential, she could no longer pain, but not holding her brushes, worsening handwriting, worsening gait difficulty,  We have reviewed MRI of thoracic spine, that was normal, MRI of lumbar spine, mild generative disc disease, no significant canal or foraminal stenosis.  She denies memory trouble, she denies orthostatic symptoms, she denies dysarthria, no dysphagia  She lives with her daughters, EMG nerve conduction study was normal  REVIEW OF SYSTEMS: Full 14 system review of systems performed and notable only for ringing in ears, joint pain, joint swelling, back pain, achy muscles, muscle cramps, walking difficulties, coordination problem, weakness, tremor,    ALLERGIES: Allergies  Allergen Reactions  . Sulfa Antibiotics     Long time ago and does not remember reaction    HOME MEDICATIONS: Current Outpatient Prescriptions on File Prior to Visit  Medication Sig Dispense Refill  . acetaminophen (TYLENOL) 500 MG tablet Take 1,000 mg by mouth 2 (two) times daily as needed for pain.      . Cholecalciferol (VITAMIN D3) 5000 UNITS CAPS Take 5,000 mg by mouth every evening.      . Coenzyme Q10 (CO Q 10) 100 MG CAPS Take 100 mg  by mouth daily.       . indomethacin (INDOCIN) 25 MG capsule Take 25-50 mg by mouth 2 (two) times daily with a meal. 2 tablets (50 mg) every morning and 1 tablet (25 mg) every evening      . lisinopril-hydrochlorothiazide (PRINZIDE,ZESTORETIC) 10-12.5 MG per tablet Take 1 tablet by mouth daily. PATIENT NEED BP RECHECK VISIT FOR ADDITIONAL REFILLS  30 tablet  0  . lisinopril-hydrochlorothiazide  (PRINZIDE,ZESTORETIC) 20-12.5 MG per tablet Take 1 tablet by mouth daily. PATIENT BP RECHECK VISIT FOR ADDITIONAL REFILLS  30 tablet  0  . TAMIFLU 75 MG capsule Take 75 capsules by mouth daily.       No current facility-administered medications on file prior to visit.    PAST MEDICAL HISTORY: Past Medical History  Diagnosis Date  . Allergy   . Arthritis   . Hypertension   . Anxiety     PAST SURGICAL HISTORY: Past Surgical History  Procedure Laterality Date  . Foot surgery  2011    3rd metatarsal LT foot  . Tonsillectomy and adenoidectomy      FAMILY HISTORY: Family History  Problem Relation Age of Onset  . Cancer Mother   . Diabetes Mother   . Kidney disease Mother   . COPD Father   . Brain cancer Brother     SOCIAL HISTORY:  History   Social History  . Marital Status: Divorced    Spouse Name: N/A    Number of Children: 7  . Years of Education: 12   Occupational History  . ACTIVITY COORDINATOR, lead activity    Social History Main Topics  . Smoking status: Former Smoker    Quit date: 03/14/1988  . Smokeless tobacco: Never Used  . Alcohol Use: No  . Drug Use: No  . Sexual Activity: Not on file   Other Topics Concern  . Not on file   Social History Narrative  . No narrative on file   PHYSICAL EXAM   Filed Vitals:   01/17/14 1027  BP: 128/85  Pulse: 72  Height: 5\' 6"  (1.676 m)  Weight: 212 lb (96.163 kg)   Body mass index is 34.23 kg/(m^2).   Generalized: In no acute distress  Neck: Supple, no carotid bruits   Cardiac: Regular rate rhythm  Pulmonary: Clear to auscultation bilaterally  Musculoskeletal: No deformity  Neurological examination  Mentation: Alert oriented to time, place, history taking, and causual conversation  Cranial nerve II-XII: Pupils were equal round reactive to light. Extraocular movements were full.  Visual field were full on confrontational test. Bilateral fundi were sharp.  Facial sensation and strength were  normal. Hearing was intact to finger rubbing bilaterally. Uvula tongue midline.  Head turning and shoulder shrug and were normal and symmetric.Tongue protrusion into cheek strength was normal.  Motor: She has no significant weakness of both upper and lower extremities, she has moderate rigidity of right upper extremity, moderate rigidity of bilateral lower extremity, right worse than left flatfeet bilaterally, mild deformity of left foot.  Sensory: Intact to fine touch, pinprick, preserved vibratory sensation, and proprioception at toes.  Coordination: Normal finger to nose, heel-to-shin bilaterally there was no truncal ataxia  Gait: Rising up from seated position multiple attempts, pushing on  chair arm, stiff, cautious, very unsteady gait, leaning forward, dragging her right leg  Romberg signs: Negative  Deep tendon reflexes: Brachioradialis 3/3, biceps 3/3, triceps 2/2, patellar 3/3, Achilles 2/2, plantar responses were  extensor  bilaterally.She also has a left Hoffman sign  DIAGNOSTIC DATA (LABS, IMAGING, TESTING) - I reviewed patient records, labs, notes, testing and imaging myself where available.  Lab Results  Component Value Date   WBC 9.8 06/12/2013   HGB 11.7* 06/12/2013   HCT 37.3* 06/12/2013   MCV 86.2 06/12/2013   PLT 249 03/05/2013      Component Value Date/Time   NA 142 12/29/2013 0911   NA 140 06/12/2013 1322   K 4.6 12/29/2013 0911   CL 98 12/29/2013 0911   CO2 23 12/29/2013 0911   GLUCOSE 104* 12/29/2013 0911   GLUCOSE 98 06/12/2013 1322   BUN 14 12/29/2013 0911   BUN 13 06/12/2013 1322   CREATININE 0.75 12/29/2013 0911   CREATININE 0.74 06/12/2013 1322   CALCIUM 9.4 12/29/2013 0911   PROT 7.0 12/29/2013 0911   PROT 6.8 06/12/2013 1322   ALBUMIN 4.2 06/12/2013 1322   AST 17 12/29/2013 0911   ALT 14 12/29/2013 0911   ALKPHOS 155* 12/29/2013 0911   BILITOT <0.2 12/29/2013 0911   GFRNONAA 89 12/29/2013 0911   GFRAA 102 12/29/2013 0911   Lab Results  Component Value Date   CHOL 194 03/05/2013    HDL 48 12/29/2013   LDLCALC 140* 12/29/2013   TRIG 120 12/29/2013   CHOLHDL 4.4 12/29/2013    Lab Results  Component Value Date   TSH 0.951 12/29/2013     ASSESSMENT AND PLAN  Zahriah Mikita is a 58 y.o. female  with gradual onset gait difficulties, hyperreflexia of both upper and lower extremities, right upper and lower extremity rigidity, bilateral Babinski signs, left Hoffman signs,  1.  suggested the central nervous system etiology, with parkinsonian features, localize the lesion to the brain, left hemisphere differentiation diagnosis including cortical basal ganglion degeneration, Multisystem atrophy  2, will try sinemet 25/100 ii tid 3. Home pt 4. Refer to [facility] Dr. Hoyt Koch. 5. RTC in 2 months  Marcial Pacas, M.D. Ph.D.  [facility] [address] [place], [place] [address] [phone]

## 2014-01-18 ENCOUNTER — Telehealth: Payer: Self-pay

## 2014-01-18 NOTE — Telephone Encounter (Signed)
Le - Pt has a BP follow up appointment with you on April 10.  She has about 10 days worth of lisinopril left.  Can we please refill this to last her until her appointment?  [phone]

## 2014-01-19 MED ORDER — LISINOPRIL-HYDROCHLOROTHIAZIDE 20-12.5 MG PO TABS: 1.0000 | ORAL_TABLET | Freq: Every day | ORAL | Status: DC

## 2014-01-19 MED ORDER — LISINOPRIL-HYDROCHLOROTHIAZIDE 10-12.5 MG PO TABS: 1.0000 | ORAL_TABLET | Freq: Every day | ORAL | Status: DC

## 2014-01-19 NOTE — Telephone Encounter (Signed)
Notified pt 1 mos of each sent in to cover until appt, but make sure she keeps appt so we can make sure BP is now controlled. Pt agreed.

## 2014-02-04 ENCOUNTER — Ambulatory Visit (INDEPENDENT_AMBULATORY_CARE_PROVIDER_SITE_OTHER): Payer: 59 | Admitting: Family Medicine

## 2014-02-04 ENCOUNTER — Encounter: Payer: Self-pay | Admitting: Family Medicine

## 2014-02-04 VITALS — BP 158/88 | HR 69 | Temp 98.7°F | Resp 16 | Ht 66.5 in | Wt 202.4 lb

## 2014-02-04 DIAGNOSIS — E785 Hyperlipidemia, unspecified: Secondary | ICD-10-CM

## 2014-02-04 DIAGNOSIS — I10 Essential (primary) hypertension: Secondary | ICD-10-CM

## 2014-02-04 MED ORDER — LISINOPRIL-HYDROCHLOROTHIAZIDE 20-12.5 MG PO TABS: 1.0000 | ORAL_TABLET | Freq: Every day | ORAL | Status: DC

## 2014-02-04 NOTE — Progress Notes (Signed)
Chief Complaint:  Chief Complaint  Patient presents with  . Hypertension  . Medication Refill    Lisinopril-HCTZ 20-12.5 mg    HPI: Jacqueline Atkinson is a 58 y.o. female who is here for  Recheck HTN meds , she normally checks her BP and at home it runs in the  130/80s  Recent dx of possible parkinsons,s tarted on Sinemet and has only been on it for 3 weeks.  She ahs no SEs, she is compliant with her meds.   Past Medical History  Diagnosis Date  . Allergy   . Arthritis   . Hypertension   . Anxiety   . Hyperlipidemia    Past Surgical History  Procedure Laterality Date  . Foot surgery  2011    3rd metatarsal LT foot  . Tonsillectomy and adenoidectomy     History   Social History  . Marital Status: Divorced    Spouse Name: N/A    Number of Children: 7  . Years of Education: 12   Occupational History  . ACTIVITY COORDINATOR    Social History Main Topics  . Smoking status: Former Smoker    Quit date: 03/14/1988  . Smokeless tobacco: Never Used  . Alcohol Use: No  . Drug Use: No  . Sexual Activity: None   Other Topics Concern  . None   Social History Narrative  . None   Family History  Problem Relation Age of Onset  . Cancer Mother   . Diabetes Mother   . Kidney disease Mother   . COPD Father   . Brain cancer Brother    Allergies  Allergen Reactions  . Sulfa Antibiotics     Long time ago and does not remember reaction   Prior to Admission medications   Medication Sig Start Date End Date Taking? Authorizing Provider  acetaminophen (TYLENOL) 500 MG tablet Take 1,000 mg by mouth 2 (two) times daily as needed for pain.   Yes Historical Provider, MD  Carbidopa-Levodopa ER (SINEMET CR) 25-100 MG tablet controlled release Take 1 tablet by mouth 2 (two) times daily. 01/17/14  Yes Marcial Pacas, MD  Cholecalciferol (VITAMIN D3) 5000 UNITS CAPS Take 5,000 mg by mouth every evening.   Yes Historical Provider, MD  Coenzyme Q10 (CO Q 10) 100 MG CAPS Take 100 mg by  mouth daily.    Yes Historical Provider, MD  indomethacin (INDOCIN) 25 MG capsule Take 25-50 mg by mouth 2 (two) times daily with a meal. 2 tablets (50 mg) every morning and 1 tablet (25 mg) every evening   Yes Historical Provider, MD  lisinopril-hydrochlorothiazide (PRINZIDE,ZESTORETIC) 20-12.5 MG per tablet Take 1 tablet by mouth daily. PATIENT BP RECHECK VISIT FOR ADDITIONAL REFILLS 01/19/14  Yes Ryan M Dunn, PA-C  carbidopa-levodopa (SINEMET IR) 25-100 MG per tablet Take 2 tablets by mouth 3 (three) times daily. 01/17/14   Marcial Pacas, MD  lisinopril-hydrochlorothiazide (PRINZIDE,ZESTORETIC) 10-12.5 MG per tablet Take 1 tablet by mouth daily. PATIENT NEED BP RECHECK VISIT FOR ADDITIONAL REFILLS 01/19/14   Areta Haber Dunn, PA-C  TAMIFLU 75 MG capsule Take 75 capsules by mouth daily. 10/29/13   Historical Provider, MD     ROS: The patient denies fevers, chills, night sweats, unintentional weight loss, chest pain, palpitations, wheezing, dyspnea on exertion, nausea, vomiting, abdominal pain, dysuria, hematuria, melena, numbness, weakness, or tingling.   All other systems have been reviewed and were otherwise negative with the exception of those mentioned in the HPI and as above.  PHYSICAL EXAM: Filed Vitals:   02/04/14 0840  BP: 158/88  Pulse: 69  Temp: 98.7 F (37.1 C)  Resp: 16  Repeat BP was 135/85 Filed Vitals:   02/04/14 0840  Height: 5' 6.5" (1.689 m)  Weight: 202 lb 6.4 oz (91.808 kg)   Body mass index is 32.18 kg/(m^2).  General: Alert, no acute distress HEENT:  Normocephalic, atraumatic, oropharynx patent. EOMI, PERRLA Cardiovascular:  Regular rate and rhythm, no rubs murmurs or gallops.  No Carotid bruits, radial pulse intact. No pedal edema.  Respiratory: Clear to auscultation bilaterally.  No wheezes, rales, or rhonchi.  No cyanosis, no use of accessory musculature GI: No organomegaly, abdomen is soft and non-tender, positive bowel sounds.  No masses. Skin: No  rashes. Neurologic: Facial musculature symmetric. Psychiatric: Patient is appropriate throughout our interaction. Lymphatic: No cervical lymphadenopathy Musculoskeletal: Gait intact.   LABS: Results for orders placed in visit on 12/29/13  RPR      Result Value Ref Range   RPR Non Reactive  Non Reactive  VITAMIN B12      Result Value Ref Range   Vitamin B-12 368  211 - 946 pg/mL  HIV ANTIBODY (ROUTINE TESTING)      Result Value Ref Range   HIV 1/O/2 Abs-Index Value <1.00  <1.00   HIV-1/HIV-2 Ab Non Reactive  Non Reactive  FOLATE      Result Value Ref Range   Folate 10.2  >3.0 ng/mL  C-REACTIVE PROTEIN      Result Value Ref Range   CRP 21.4 (*) 0.0 - 4.9 mg/L  THYROID PANEL WITH TSH      Result Value Ref Range   TSH 0.951  0.450 - 4.500 uIU/mL   T4, Total 5.7  4.5 - 12.0 ug/dL   T3 Uptake Ratio 27  24 - 39 %   Free Thyroxine Index 1.5  1.2 - 4.9  CK      Result Value Ref Range   Total CK 51  24 - 173 U/L  SEDIMENTATION RATE      Result Value Ref Range   Sed Rate 61 (*) 0 - 40 mm/hr  COMPREHENSIVE METABOLIC PANEL      Result Value Ref Range   Glucose 104 (*) 65 - 99 mg/dL   BUN 14  6 - 24 mg/dL   Creatinine, Ser 0.75  0.57 - 1.00 mg/dL   GFR calc non Af Amer 89  >59 mL/min/1.73   GFR calc Af Amer 102  >59 mL/min/1.73   BUN/Creatinine Ratio 19  9 - 23   Sodium 142  134 - 144 mmol/L   Potassium 4.6  3.5 - 5.2 mmol/L   Chloride 98  97 - 108 mmol/L   CO2 23  18 - 29 mmol/L   Calcium 9.4  8.7 - 10.2 mg/dL   Total Protein 7.0  6.0 - 8.5 g/dL   Albumin 4.5  3.5 - 5.5 g/dL   Globulin, Total 2.5  1.5 - 4.5 g/dL   Albumin/Globulin Ratio 1.8  1.1 - 2.5   Total Bilirubin <0.2  0.0 - 1.2 mg/dL   Alkaline Phosphatase 155 (*) 39 - 117 IU/L   AST 17  0 - 40 IU/L   ALT 14  0 - 32 IU/L  ANA W/REFLEX IF POSITIVE      Result Value Ref Range   ANA Negative  Negative  COPPER, SERUM      Result Value Ref Range   Copper 141  72 - 166 ug/dL  LIPID PANEL      Result Value Ref  Range   Cholesterol, Total 212 (*) 100 - 199 mg/dL   Triglycerides 120  0 - 149 mg/dL   HDL 48  >39 mg/dL   VLDL Cholesterol Cal 24  5 - 40 mg/dL   LDL Calculated 140 (*) 0 - 99 mg/dL   Chol/HDL Ratio 4.4  0.0 - 4.4 ratio units     EKG/XRAY:   Primary read interpreted by Dr. Marin Comment at [facility]   ASSESSMENT/PLAN: Encounter Diagnoses  Name Primary?  . HTN (hypertension) Yes  . Other and unspecified hyperlipidemia    Repeat BP was 135/85 Refilled Lisinopril/HCTZ She does have hyperlipidemia and because of her new onset gait issues and dx of parkinson, she would prefer to use only fish oil for now.  We will recheck her cholesterol in 4-6 months  Gross sideeffects, risk and benefits, and alternatives of medications d/w patient. Patient is aware that all medications have potential sideeffects and we are unable to predict every sideeffect or drug-drug interaction that may occur.  Tiyana Galla P Donovyn Guidice, DO 02/04/2014 9:20 AM

## 2014-02-09 ENCOUNTER — Encounter: Payer: 59 | Admitting: Neurology

## 2014-02-14 ENCOUNTER — Telehealth: Payer: Self-pay | Admitting: Neurology

## 2014-02-14 DIAGNOSIS — M25569 Pain in unspecified knee: Secondary | ICD-10-CM

## 2014-02-14 DIAGNOSIS — G20 Parkinson's disease: Secondary | ICD-10-CM

## 2014-02-14 DIAGNOSIS — M549 Dorsalgia, unspecified: Secondary | ICD-10-CM

## 2014-02-14 DIAGNOSIS — R202 Paresthesia of skin: Secondary | ICD-10-CM

## 2014-02-14 NOTE — Telephone Encounter (Signed)
Spoke with patient informed her that a new order will be placed, patient states that she is getting around a lot better and states that she does not need home physical therapy, she states she is able to go to a facility.

## 2014-02-14 NOTE — Telephone Encounter (Signed)
Patient calling to state that [name] mentioned physical therapy referral during last office visit but patient has not heard anything back yet. Please call and advise patient.

## 2014-02-14 NOTE — Telephone Encounter (Deleted)
Per last note patient was ordered home physical therapy and,  no orders in the system please reorder this referral for home physical therapy.

## 2014-02-15 NOTE — Telephone Encounter (Signed)
Please let patient know, I have entered order for outpatient physical therapy

## 2014-02-17 ENCOUNTER — Ambulatory Visit: Payer: 59 | Attending: Neurology | Admitting: Physical Therapy

## 2014-02-17 DIAGNOSIS — IMO0001 Reserved for inherently not codable concepts without codable children: Secondary | ICD-10-CM | POA: Insufficient documentation

## 2014-02-17 DIAGNOSIS — G20 Parkinson's disease: Secondary | ICD-10-CM | POA: Insufficient documentation

## 2014-02-17 DIAGNOSIS — R269 Unspecified abnormalities of gait and mobility: Secondary | ICD-10-CM | POA: Insufficient documentation

## 2014-02-17 DIAGNOSIS — G20A1 Parkinson's disease without dyskinesia, without mention of fluctuations: Secondary | ICD-10-CM | POA: Insufficient documentation

## 2014-02-21 ENCOUNTER — Ambulatory Visit: Payer: 59 | Admitting: Physical Therapy

## 2014-02-22 ENCOUNTER — Ambulatory Visit: Payer: 59 | Admitting: Physical Therapy

## 2014-03-01 ENCOUNTER — Ambulatory Visit: Payer: 59 | Attending: Neurology | Admitting: Physical Therapy

## 2014-03-01 DIAGNOSIS — IMO0001 Reserved for inherently not codable concepts without codable children: Secondary | ICD-10-CM | POA: Diagnosis not present

## 2014-03-01 DIAGNOSIS — G20 Parkinson's disease: Secondary | ICD-10-CM | POA: Insufficient documentation

## 2014-03-01 DIAGNOSIS — R269 Unspecified abnormalities of gait and mobility: Secondary | ICD-10-CM | POA: Diagnosis not present

## 2014-03-01 DIAGNOSIS — G20A1 Parkinson's disease without dyskinesia, without mention of fluctuations: Secondary | ICD-10-CM | POA: Insufficient documentation

## 2014-03-03 ENCOUNTER — Ambulatory Visit: Payer: 59 | Admitting: Physical Therapy

## 2014-03-03 DIAGNOSIS — IMO0001 Reserved for inherently not codable concepts without codable children: Secondary | ICD-10-CM | POA: Diagnosis not present

## 2014-03-04 ENCOUNTER — Ambulatory Visit: Payer: 59 | Admitting: Physical Therapy

## 2014-03-04 DIAGNOSIS — IMO0001 Reserved for inherently not codable concepts without codable children: Secondary | ICD-10-CM | POA: Diagnosis not present

## 2014-03-07 ENCOUNTER — Ambulatory Visit: Payer: 59 | Admitting: Physical Therapy

## 2014-03-07 DIAGNOSIS — IMO0001 Reserved for inherently not codable concepts without codable children: Secondary | ICD-10-CM | POA: Diagnosis not present

## 2014-03-08 ENCOUNTER — Ambulatory Visit: Payer: 59 | Admitting: Physical Therapy

## 2014-03-09 ENCOUNTER — Ambulatory Visit: Payer: 59 | Admitting: Physical Therapy

## 2014-03-09 DIAGNOSIS — IMO0001 Reserved for inherently not codable concepts without codable children: Secondary | ICD-10-CM | POA: Diagnosis not present

## 2014-03-14 ENCOUNTER — Ambulatory Visit: Payer: 59 | Admitting: Physical Therapy

## 2014-03-14 DIAGNOSIS — IMO0001 Reserved for inherently not codable concepts without codable children: Secondary | ICD-10-CM | POA: Diagnosis not present

## 2014-03-16 ENCOUNTER — Ambulatory Visit: Payer: 59 | Admitting: Physical Therapy

## 2014-03-16 DIAGNOSIS — IMO0001 Reserved for inherently not codable concepts without codable children: Secondary | ICD-10-CM | POA: Diagnosis not present

## 2014-03-18 ENCOUNTER — Ambulatory Visit: Payer: 59 | Admitting: Physical Therapy

## 2014-03-18 DIAGNOSIS — IMO0001 Reserved for inherently not codable concepts without codable children: Secondary | ICD-10-CM | POA: Diagnosis not present

## 2014-03-22 ENCOUNTER — Ambulatory Visit (INDEPENDENT_AMBULATORY_CARE_PROVIDER_SITE_OTHER): Payer: 59 | Admitting: Neurology

## 2014-03-22 ENCOUNTER — Encounter: Payer: Self-pay | Admitting: Neurology

## 2014-03-22 VITALS — BP 149/88 | HR 80 | Ht 66.0 in | Wt 213.0 lb

## 2014-03-22 DIAGNOSIS — G20 Parkinson's disease: Secondary | ICD-10-CM

## 2014-03-22 DIAGNOSIS — G20A1 Parkinson's disease without dyskinesia, without mention of fluctuations: Secondary | ICD-10-CM

## 2014-03-22 MED ORDER — CYCLOBENZAPRINE HCL 10 MG PO TABS: 10.0000 mg | ORAL_TABLET | Freq: Three times a day (TID) | ORAL | Status: DC | PRN

## 2014-03-22 MED ORDER — CARBIDOPA-LEVODOPA 25-100 MG PO TABS: 2.0000 | ORAL_TABLET | Freq: Three times a day (TID) | ORAL | Status: DC

## 2014-03-22 NOTE — Progress Notes (Signed)
PATIENT: Jacqueline Atkinson DOB: 04-01-1956  HISTORICAL  Jacqueline Atkinson is a 58 yo RH white female was referred by Neurosurgeon Dr. 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 Amil Amen, she has a regular knee cortisone shots, each time has the fluid taken out, which only help her walking temporarily,   Her gait difficulty has gradually getting worse, she tends to veer to her right side, over the past few months, she also lost extremity of a right hand, tends to use her left hand, difficulty to sign her name using her right hand,   She has mild numbness tingling in her toes, and ball area of her feet, over past few weeks, since Dec 2014, she also developed urinary urgency, incontinence, occasionally bowel accident,   She also had a few month history of upper back pain, getting worse over the past few weeks, feels like it is penetrating her from back to front.  In January 2015, she developed episode of acute onset severe headaches, this led to a CAT scan, later MRI of the brain, with without contrast, which showed a left anterior fossa meningioma, 1.7 cm, with no surrounding edema, Partially empty sella and prominent peri optic spaces noted.    MR angiogram: No abnormal flow seen within the anterior left middle cranial fossa structure which has an appearance most consistent with a meningioma  rather than aneurysm.No large vessel disease   MRI cervical  which was done in February 2015 and [facility] imaging, has multilevel mild degenerative disc  disease, most severe at C4-5, 5-6 level, but there was no significant foraminal or canal stenosis.  There was no family history of similar gait disorder, she has 7 children, none of them has the gait disorder  UPDATE January 17 2014: She had worsening gait difficulty, over the past 3 months, since January 2015, she also noticed right hand difficulty, there is differential, she could no longer pain, but not holding her brushes, worsening handwriting, worsening gait difficulty,  We have reviewed MRI of thoracic spine, that was normal, MRI of lumbar spine, mild generative disc disease, no significant canal or foraminal stenosis.  She denies memory trouble, she denies orthostatic symptoms, she denies dysarthria, no dysphagia  She lives with her daughters, EMG nerve conduction study was normal  UPDATE May 26th 2015; She started Sinemet 25/100 ER one 9am, one 9pm, she also went to physical therapy, she felt better, balance, walking is better, last fall April 23rd, has not fallen since then.    Her appointment with [facility] is delayed to August  REVIEW OF SYSTEMS: Full 14 system review of systems performed and notable only for  right shoulder stiffness, back pain,    ALLERGIES: Allergies  Allergen Reactions  . Sulfa Antibiotics     Long time ago and does not remember reaction    HOME MEDICATIONS: Current Outpatient Prescriptions on File Prior to Visit  Medication Sig Dispense Refill  . acetaminophen (TYLENOL) 500 MG tablet Take 1,000 mg by mouth 2 (two) times daily as needed for pain.      Marland Kitchen  carbidopa-levodopa (SINEMET IR) 25-100 MG per tablet Take 2 tablets by mouth 3 (three) times daily.  180 tablet  12  . Carbidopa-Levodopa ER (SINEMET CR) 25-100 MG tablet controlled release Take 1 tablet by mouth 2 (two) times daily.  180 tablet  12  . Cholecalciferol (VITAMIN D3) 5000 UNITS CAPS Take 5,000 mg by mouth every evening.      . Coenzyme Q10 (CO Q 10) 100 MG CAPS Take 100 mg by  mouth daily.       . indomethacin (INDOCIN) 25 MG capsule Take 25-50 mg by mouth 2 (two) times daily with a meal. 2 tablets (50 mg) every morning and 1 tablet (25 mg) every evening      . lisinopril-hydrochlorothiazide (PRINZIDE,ZESTORETIC) 20-12.5 MG per tablet Take 1 tablet by mouth daily.  90 tablet  3   No current facility-administered medications on file prior to visit.    PAST MEDICAL HISTORY: Past Medical History  Diagnosis Date  . Allergy   . Arthritis   . Hypertension   . Anxiety   . Hyperlipidemia     PAST SURGICAL HISTORY: Past Surgical History  Procedure Laterality Date  . Foot surgery  2011    3rd metatarsal LT foot  . Tonsillectomy and adenoidectomy      FAMILY HISTORY: Family History  Problem Relation Age of Onset  . Cancer Mother   . Diabetes Mother   . Kidney disease Mother   . COPD Father   . Brain cancer Brother     SOCIAL HISTORY:  History   Social History  . Marital Status: Divorced    Spouse Name: N/A    Number of Children: 7  . Years of Education: 12   Occupational History  . ACTIVITY COORDINATOR, lead activity    Social History Main Topics  . Smoking status: Former Smoker    Quit date: 03/14/1988  . Smokeless tobacco: Never Used  . Alcohol Use: No  . Drug Use: No  . Sexual Activity: Not on file   Other Topics Concern  . Not on file   Social History Narrative  . No narrative on file   PHYSICAL EXAM   Filed Vitals:   03/22/14 1049  BP: 149/88  Pulse: 80  Height: 5\' 6"  (1.676 m)  Weight: 213 lb (96.616 kg)   Body mass index is 34.4 kg/(m^2).   Generalized: In no acute distress  Neck: Supple, no carotid bruits   Cardiac: Regular rate rhythm  Pulmonary: Clear to auscultation bilaterally  Musculoskeletal: No deformity  Neurological examination  Mentation: Alert oriented to time, place, history taking, and causual conversation  Cranial nerve II-XII: Pupils were equal round reactive to light. Extraocular movements  were full.  Visual field were full on confrontational test. Bilateral fundi were sharp.  Facial sensation and strength were normal. Hearing was intact to finger rubbing bilaterally. Uvula tongue midline.  Head turning and shoulder shrug and were normal and symmetric.Tongue protrusion into cheek strength was normal.  Motor: She has no significant weakness of both upper and lower extremities, she has moderate to severe rigidity of right upper extremity, moderate rigidity of bilateral lower extremity, right worse than left flatfeet bilaterally, mild deformity of left foot. Palmomental signs, she has early fatigability with rapid wrist opening and closure, finger tapping  Sensory: Intact to fine touch, pinprick, preserved vibratory sensation, and proprioception at toes.  Coordination: Normal finger to nose, heel-to-shin bilaterally there was no truncal ataxia  Gait: Rising up from seated position multiple attempts,  pushing on  chair arm, stiff, cautious, very unsteady gait, leaning forward, dragging her right leg  Romberg signs: Negative  Deep tendon reflexes: Brachioradialis 3/3, biceps 3/3, triceps 2/2, patellar 3/3, Achilles 2/2, plantar responses were  extensor  bilaterally.She also has a left Hoffman sign   DIAGNOSTIC DATA (LABS, IMAGING, TESTING) - I reviewed patient records, labs, notes, testing and imaging myself where available.  Lab Results  Component Value Date   WBC 9.8 06/12/2013   HGB 11.7* 06/12/2013   HCT 37.3* 06/12/2013   MCV 86.2 06/12/2013   PLT 249 03/05/2013      Component Value Date/Time   NA 142 12/29/2013 0911   NA 140 06/12/2013 1322   K 4.6 12/29/2013 0911   CL 98 12/29/2013 0911   CO2 23 12/29/2013 0911   GLUCOSE 104* 12/29/2013 0911   GLUCOSE 98 06/12/2013 1322   BUN 14 12/29/2013 0911   BUN 13 06/12/2013 1322   CREATININE 0.75 12/29/2013 0911   CREATININE 0.74 06/12/2013 1322   CALCIUM 9.4 12/29/2013 0911   PROT 7.0 12/29/2013 0911   PROT 6.8 06/12/2013 1322   ALBUMIN 4.2  06/12/2013 1322   AST 17 12/29/2013 0911   ALT 14 12/29/2013 0911   ALKPHOS 155* 12/29/2013 0911   BILITOT <0.2 12/29/2013 0911   GFRNONAA 89 12/29/2013 0911   GFRAA 102 12/29/2013 0911   Lab Results  Component Value Date   CHOL 194 03/05/2013   HDL 48 12/29/2013   LDLCALC 140* 12/29/2013   TRIG 120 12/29/2013   CHOLHDL 4.4 12/29/2013    Lab Results  Component Value Date   TSH 0.951 12/29/2013     ASSESSMENT AND PLAN  Jacqueline Atkinson is a 58 y.o. female  with gradual onset gait difficulties, hyperreflexia of both upper and lower extremities, right upper and lower extremity rigidity, bilateral Babinski signs, left Hoffman signs,  1.  suggested the central nervous system etiology, with parkinsonian features, localize the lesion to the brain, left hemisphere differentiation diagnosis including cortical basal ganglion degeneration, multisystem atrophy , 2, will try sinemet 25/100 ii tid 3.  continue physical therapy, occupational therapy, 4.  second opinion by  [facility] Dr. Hoyt Koch. 5. RTC in 6  months  Marcial Pacas, M.D. Ph.D.  [facility] [address], [place], [place] [address] [phone]

## 2014-03-23 ENCOUNTER — Ambulatory Visit: Payer: 59 | Admitting: Physical Therapy

## 2014-03-24 ENCOUNTER — Ambulatory Visit: Payer: 59 | Admitting: Physical Therapy

## 2014-03-24 ENCOUNTER — Ambulatory Visit (INDEPENDENT_AMBULATORY_CARE_PROVIDER_SITE_OTHER): Payer: 59 | Admitting: Family Medicine

## 2014-03-24 VITALS — BP 118/78 | HR 84 | Temp 98.4°F | Resp 18 | Ht 66.5 in | Wt 211.0 lb

## 2014-03-24 DIAGNOSIS — IMO0001 Reserved for inherently not codable concepts without codable children: Secondary | ICD-10-CM | POA: Diagnosis not present

## 2014-03-24 DIAGNOSIS — G259 Extrapyramidal and movement disorder, unspecified: Secondary | ICD-10-CM

## 2014-03-24 NOTE — Progress Notes (Signed)
Patient is a 58 yo woman diagnosed with Parkinson's Disease and [facility] physical [facility].  She has been on the PT program for one month.  She was diagnosed with Parkinson's by her neurologist, although the diagnosis suggests an atypical movement disorder.  Her balance is poor, with great rigidity particularly on the right.  She has lost her fine motor skills with the right hand.  She also has back muscle spasms at night.  No sensory loss  She works in adult day care with impaired adults.  She has taken a temporary leave of absence.    She recently switched to the immediate release Sinimet.  Objective:  NAD Patient shows right sided apraxia. Patient using cane. Neck: supple, no bruits Chest:  Clear Heart:  Regular, no murmur Ext: no edema.  A:  Cleared for the [employer] fitness program.

## 2014-03-28 ENCOUNTER — Ambulatory Visit: Payer: 59 | Attending: Neurology | Admitting: Physical Therapy

## 2014-03-28 DIAGNOSIS — R269 Unspecified abnormalities of gait and mobility: Secondary | ICD-10-CM | POA: Insufficient documentation

## 2014-03-28 DIAGNOSIS — G20 Parkinson's disease: Secondary | ICD-10-CM | POA: Insufficient documentation

## 2014-03-28 DIAGNOSIS — IMO0001 Reserved for inherently not codable concepts without codable children: Secondary | ICD-10-CM | POA: Diagnosis not present

## 2014-03-28 DIAGNOSIS — G20A1 Parkinson's disease without dyskinesia, without mention of fluctuations: Secondary | ICD-10-CM | POA: Insufficient documentation

## 2014-03-30 ENCOUNTER — Ambulatory Visit: Payer: 59 | Admitting: Physical Therapy

## 2014-04-01 ENCOUNTER — Ambulatory Visit: Payer: 59 | Admitting: Physical Therapy

## 2014-04-01 ENCOUNTER — Ambulatory Visit: Payer: 59 | Admitting: Occupational Therapy

## 2014-04-01 DIAGNOSIS — IMO0001 Reserved for inherently not codable concepts without codable children: Secondary | ICD-10-CM | POA: Diagnosis not present

## 2014-04-04 ENCOUNTER — Telehealth: Payer: Self-pay | Admitting: Neurology

## 2014-04-04 ENCOUNTER — Ambulatory Visit: Payer: 59 | Admitting: Physical Therapy

## 2014-04-04 NOTE — Telephone Encounter (Signed)
Spoke with patient and she said that the Sinemet 25/100mg   immediate release tablet has not stayed down since she started taking am dosage. Tried with taking a meal but 20 minutes later it comes back up.  She can keep afternoon down but makes her nauseated.

## 2014-04-04 NOTE — Telephone Encounter (Signed)
I have called her, she is now taking Sinemet 25/100  po tid after meal. 2 tablets  3 times a day, has made her nausea, I have suggested her to go back to one tablet 3 times a day

## 2014-04-05 ENCOUNTER — Ambulatory Visit: Payer: 59 | Admitting: Occupational Therapy

## 2014-04-05 ENCOUNTER — Ambulatory Visit: Payer: 59 | Admitting: Physical Therapy

## 2014-04-05 DIAGNOSIS — IMO0001 Reserved for inherently not codable concepts without codable children: Secondary | ICD-10-CM | POA: Diagnosis not present

## 2014-04-07 ENCOUNTER — Ambulatory Visit: Payer: 59 | Admitting: Physical Therapy

## 2014-04-14 ENCOUNTER — Ambulatory Visit: Payer: 59 | Admitting: Occupational Therapy

## 2014-04-14 DIAGNOSIS — IMO0001 Reserved for inherently not codable concepts without codable children: Secondary | ICD-10-CM | POA: Diagnosis not present

## 2014-04-19 ENCOUNTER — Ambulatory Visit: Payer: 59 | Admitting: Occupational Therapy

## 2014-04-19 DIAGNOSIS — IMO0001 Reserved for inherently not codable concepts without codable children: Secondary | ICD-10-CM | POA: Diagnosis not present

## 2014-04-20 ENCOUNTER — Encounter: Payer: 59 | Admitting: Occupational Therapy

## 2014-04-20 ENCOUNTER — Ambulatory Visit: Payer: 59 | Admitting: Occupational Therapy

## 2014-04-20 DIAGNOSIS — IMO0001 Reserved for inherently not codable concepts without codable children: Secondary | ICD-10-CM | POA: Diagnosis not present

## 2014-04-21 ENCOUNTER — Ambulatory Visit: Payer: 59 | Admitting: Physical Therapy

## 2014-04-21 DIAGNOSIS — IMO0001 Reserved for inherently not codable concepts without codable children: Secondary | ICD-10-CM | POA: Diagnosis not present

## 2014-04-25 ENCOUNTER — Ambulatory Visit: Payer: 59 | Admitting: Occupational Therapy

## 2014-04-25 DIAGNOSIS — IMO0001 Reserved for inherently not codable concepts without codable children: Secondary | ICD-10-CM | POA: Diagnosis not present

## 2014-04-27 ENCOUNTER — Encounter: Payer: 59 | Admitting: Occupational Therapy

## 2014-05-02 ENCOUNTER — Encounter: Payer: 59 | Admitting: Occupational Therapy

## 2014-05-04 ENCOUNTER — Encounter: Payer: 59 | Admitting: Occupational Therapy

## 2014-05-09 ENCOUNTER — Encounter: Payer: 59 | Admitting: Occupational Therapy

## 2014-05-10 DIAGNOSIS — Z0289 Encounter for other administrative examinations: Secondary | ICD-10-CM

## 2014-05-11 ENCOUNTER — Encounter: Payer: 59 | Admitting: Occupational Therapy

## 2014-05-12 ENCOUNTER — Telehealth: Payer: Self-pay

## 2014-05-12 DIAGNOSIS — G20 Parkinson's disease: Secondary | ICD-10-CM

## 2014-05-12 NOTE — Telephone Encounter (Signed)
Diagnosed with parkinsons or parkinsons like sms. Movement disorder specialist.  She has been seeing Dr Krista Blue, and is not comfortable with dr Greer Pickerel opinion concerning her condition.  She would like a second opioion from Dr. Carles Collet at [place].   Referral has been made for eval & treat parkinsonism

## 2014-05-12 NOTE — Telephone Encounter (Signed)
Patient lmom at referral desk requesting a referral put in for [place] to Dr. Carles Collet? She didn't say on the voicemail for what. Please advise does patient need to return to clinic.   Best: [phone]

## 2014-05-12 NOTE — Telephone Encounter (Signed)
Patient Jacqueline Atkinson says that its for a movement disorder to see a dr. In [place] for it.

## 2014-05-12 NOTE — Telephone Encounter (Signed)
lmom to cb.  Need to know what the referral is for?

## 2014-05-16 ENCOUNTER — Encounter: Payer: 59 | Admitting: Occupational Therapy

## 2014-05-17 ENCOUNTER — Encounter: Payer: Self-pay | Admitting: Neurology

## 2014-05-18 ENCOUNTER — Encounter: Payer: 59 | Admitting: Occupational Therapy

## 2014-05-23 ENCOUNTER — Encounter: Payer: 59 | Admitting: Occupational Therapy

## 2014-05-23 ENCOUNTER — Ambulatory Visit (INDEPENDENT_AMBULATORY_CARE_PROVIDER_SITE_OTHER): Payer: PRIVATE HEALTH INSURANCE | Admitting: Neurology

## 2014-05-23 ENCOUNTER — Encounter: Payer: Self-pay | Admitting: Neurology

## 2014-05-23 VITALS — BP 122/68 | HR 84 | Resp 16 | Ht 66.0 in | Wt 210.0 lb

## 2014-05-23 DIAGNOSIS — G20C Parkinsonism, unspecified: Secondary | ICD-10-CM

## 2014-05-23 DIAGNOSIS — R259 Unspecified abnormal involuntary movements: Secondary | ICD-10-CM

## 2014-05-23 DIAGNOSIS — R292 Abnormal reflex: Secondary | ICD-10-CM

## 2014-05-23 DIAGNOSIS — G20 Parkinson's disease: Secondary | ICD-10-CM

## 2014-05-23 DIAGNOSIS — R269 Unspecified abnormalities of gait and mobility: Secondary | ICD-10-CM

## 2014-05-23 DIAGNOSIS — R251 Tremor, unspecified: Secondary | ICD-10-CM

## 2014-05-23 NOTE — Progress Notes (Signed)
Jacqueline Atkinson was seen today in the movement disorders clinic for neurologic consultation at the request of [name].   The patient presents today to the movement disorder clinic at [facility] neurology for consultation regarding possible Parkinson's disease.  I had the opportunity to review records from [name], her prior neurologist.  It appears that the patient has been seeing [name] since 12/29/2013.  Patient reports that she just has not been the same ever since she had a fall in 2011.  Her R foot got caught in a stool and she fell.  She was subsequently placed under the care of orthopedic surgeons and intermittently would go to have fluid drained from her knee.   She developed a R leg drag.  She, therefore, initially attributed difficulty in walking to this injury.  She states that later on, she developed balance issues (cannot say how long that took to develop).  Over the course of time, however, she noticed decreased ability to use the right hand in order to write and began to use the left hand for more tasks.  She stated that she would have to reach across her body to use the mouse (noted that as of Christmas, 2014).  In Feb, she was at work and had a bad HA and then had a CT and subsequent MRI of the brain as they saw a meningioma and so she was referred to [name].  She saw [name] on 12/29/2013.  About a week later on 01/06/2014 she followed up with [name] who had recommended an EMG and this was done and was normal.  On 01/17/2014 [name] started her on levodopa and referred her for another opinion to [facility].  She has not yet had that appointment, but it is scheduled for 06/15/2014.  Pt states that when she first got to the pharmacy, there was a RX there for both and IR and CR tablet (she now thinks that it was accident)  She only picked up the CR and took it bid and stated that it helped her feel "looser" and more steady.  When she f/u with [name], she realized that she was supposed to be on  the IR but when she tried that, she had a lot of nausea and even emesis (tried it with carbs even but initially started with 2 po tid; then went to 1 po 6 times per day and didn't have as much emesis but still had to have the carbs with it and she didn't like all the carb intake).  Therefore, she went back to the CR version on her own and she is only on one tablet at 8am/8pm. She presents today for another opinion.  Specific Symptoms:  Tremor: Yes.   per daughter and per pt patient it is more like a "shutter" at night Voice: no changes Sleep: gets to sleep easily but multiple awakenings due to back pain (interscapular)  Vivid Dreams:  Yes.    Acting out dreams:  No. Wet Pillows: Yes.   (Not all of the time) Postural symptoms:  Yes.   (better once she takes the levodopa)  Falls?  Yes.   (but none since 02/17/14) Bradykinesia symptoms: difficulty getting out of a chair (but incredibly improved with levodopa and PT) Loss of smell:  Yes.   Loss of taste:  Yes.   Urinary Incontinence:  No. (none but urinary urgency) Difficulty Swallowing:  No. (generally not but sometimes isn't chewing food well and then has  problems) Handwriting, micrographia: No. Trouble with ADL's:  Yes.    (sits down putting on pants now; trouble with eye liner)  Trouble buttoning clothing: doesn't wear those now Depression:  No. Memory changes:  Yes.   (some trouble retrieving names and words, which is abnormal for her) Hallucinations:  No.  visual distortions: Yes.   (but has some floaters too) N/V:  No. (better with CR version) Lightheaded:  No.  Syncope: No. Diplopia:  No. Dyskinesia:  No.  Neuroimaging has previously been performed.  It is available for my review today.  PREVIOUS MEDICATIONS: Sinemet and Sinemet CR  ALLERGIES:   Allergies  Allergen Reactions  . Sulfa Antibiotics     Long time ago and does not remember reaction    CURRENT MEDICATIONS:  Current Outpatient Prescriptions on File Prior to Visit   Medication Sig Dispense Refill  . Cholecalciferol (VITAMIN D3) 5000 UNITS CAPS Take 5,000 mg by mouth every evening.      . Coenzyme Q10 (CO Q 10) 100 MG CAPS Take 100 mg by mouth daily.       . cyclobenzaprine (FLEXERIL) 10 MG tablet Take 1 tablet (10 mg total) by mouth 3 (three) times daily as needed for muscle spasms.  30 tablet  6  . indomethacin (INDOCIN) 25 MG capsule Take 25-50 mg by mouth 2 (two) times daily with a meal. 2 tablets (50 mg) every morning and 1 tablet (25 mg) every evening      . lisinopril-hydrochlorothiazide (PRINZIDE,ZESTORETIC) 20-12.5 MG per tablet Take 1 tablet by mouth daily.  90 tablet  3  . acetaminophen (TYLENOL) 500 MG tablet Take 1,000 mg by mouth 2 (two) times daily as needed for pain.       No current facility-administered medications on file prior to visit.    PAST MEDICAL HISTORY:   Past Medical History  Diagnosis Date  . Allergy   . Arthritis   . Hypertension   . Anxiety   . Hyperlipidemia     PAST SURGICAL HISTORY:   Past Surgical History  Procedure Laterality Date  . Foot surgery  2011    3rd metatarsal LT foot  . Tonsillectomy and adenoidectomy      SOCIAL HISTORY:   History   Social History  . Marital Status: Divorced    Spouse Name: N/A    Number of Children: 7  . Years of Education: 12   Occupational History  . ACTIVITY COORDINATOR    Social History Main Topics  . Smoking status: Former Smoker    Quit date: 03/14/1988  . Smokeless tobacco: Never Used  . Alcohol Use: No  . Drug Use: No  . Sexual Activity: Not on file   Other Topics Concern  . Not on file   Social History Narrative  . No narrative on file    FAMILY HISTORY:   Family Status  Relation Status Death Age  . Mother Deceased 13    diabetes, kidney failure, melanoma  . Father Deceased 42    COPD  . Brother Deceased     brain cancer  . Sister Alive     healthy  . Daughter Alive     healthy  . Daughter Alive     healthy  . Daughter Alive      healthy  . Daughter Alive     healthy  . Daughter Alive     healthy  . Son Alive     healthy  . Son [employer]  healthy    ROS:  A complete 10 system review of systems was obtained and was unremarkable apart from what is mentioned above.  PHYSICAL EXAMINATION:    VITALS:   Filed Vitals:   05/23/14 0808  BP: 122/68  Pulse: 84  Resp: 16  Height: 5\' 6"  (1.676 m)  Weight: 210 lb (95.255 kg)    GEN:  The patient appears stated age and is in NAD. HEENT:  Normocephalic, atraumatic.  The mucous membranes are moist. The superficial temporal arteries are without ropiness or tenderness. CV:  RRR Lungs:  CTAB Neck/HEME:  There are no carotid bruits bilaterally.  Neurological examination:  Orientation: The patient is alert and oriented x3. Fund of knowledge is appropriate.  Recent and remote memory are intact.  Attention and concentration are normal.    Able to name objects and repeat phrases. Cranial nerves: There is good facial symmetry. Pupils are equal round and reactive to light bilaterally. Fundoscopic exam reveals clear margins bilaterally. Extraocular muscles are intact. No square wave jerks.  The visual fields are full to confrontational testing. The speech is fluent and clear. Minimal trouble with "ga ga" sounds.  Soft palate rises symmetrically and there is no tongue deviation. Hearing is intact to conversational tone. Sensation: Sensation is intact to light and pinprick throughout (facial, trunk, extremities). Vibration is intact at the bilateral big toe. There is no extinction with double simultaneous stimulation. There is no sensory dermatomal level identified. Motor: Strength is 5/5 in the bilateral upper and lower extremities.   Shoulder shrug is equal and symmetric.  There is no pronator drift. Deep tendon reflexes: Deep tendon reflexes are 3+/4 at the bilateral biceps, triceps, brachioradialis, patella and achilles. Plantar responses are upgoing bilaterally.  There is a  Hoffmans response on the L  Movement examination: Tone: There is increased tone in the right upper extremity, moderate.  Tone in the RLE is mildly increased.  The tone in the LUE/LLE is normal Abnormal movements: very mild tremor can be felt with counting backward Coordination:  There is minimal decremation with RAM's, on the R with hand opening closing and with finger taps.  The longer that she performs the task, the slower that the movement becomes.   Gait and Station: The patient has mild difficulty arising out of a deep-seated chair without the use of the hands. It takes 2 attempts.  Gait is antalgic.  She drags the R leg and doesn't swing the R arm at all.  Doesn't shuffle.        ASSESSMENT/PLAN:  1.  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  However, she is rigid on the R and is doing better with levodopa.   -She does have [facility] signs of hyperreflexia, upgoing toes and Hoffmans response on the L.  EMG was apparently unremarkable by [name] and I saw no fasciculations.    -I think that levodopa responsive dystonia is in the Ddx although she only has R sided sx's which would be odd.  PLS would also be in the Ddx.    -DaT scan will be done and she will f/u after that.  -High complexity visit with greater than 50% of 80 min visit in counseling and coordinating care.

## 2014-05-23 NOTE — Patient Instructions (Signed)
1. We are referring you to [facility] for a DaT scan. They will contact you directly to set up a time for this testing. If you do not hear from them they can be contacted at [phone].  

## 2014-05-25 ENCOUNTER — Ambulatory Visit: Payer: BLUE CROSS/BLUE SHIELD | Admitting: Occupational Therapy

## 2014-06-06 ENCOUNTER — Telehealth: Payer: Self-pay | Admitting: Neurology

## 2014-06-06 NOTE — Telephone Encounter (Signed)
They will give her something to protect the thyroid and they will call that in for her (not [place], [facility]).  Narcan is a medication that is given to reverse overdoses so not sure what medication she is talking about

## 2014-06-06 NOTE — Telephone Encounter (Signed)
Pt called requesting to speak to a nurse regarding her DAP SCAN she has scheduled on 06/23/14. She has a couple of questions and concerns she would like to go over. C/B [phone]

## 2014-06-06 NOTE — Telephone Encounter (Signed)
If she hasn't tried klonopin, may give RX for that 0.5 mg - 1/2 tablet po q hs.

## 2014-06-06 NOTE — Telephone Encounter (Signed)
Called and spoke with patient. She states she was told she needed to take a medication prior to DAT scan to protect her thyroid. Is this something we prescribe? She also wanted to know if we have any suggestions to help her sleep? She is having a lot of trouble. She took something of her daughter's she thought was called "Narcan" 7.5 mg and it helped. Please advise.

## 2014-06-06 NOTE — Telephone Encounter (Signed)
Any other suggestions on something to help her sleep?

## 2014-06-07 MED ORDER — CLONAZEPAM 0.5 MG PO TABS: 0.2500 mg | ORAL_TABLET | Freq: Every day | ORAL | Status: DC

## 2014-06-07 NOTE — Telephone Encounter (Signed)
Patient made aware. RX for Clonazepam sent to [facility].

## 2014-06-17 ENCOUNTER — Encounter: Payer: Self-pay | Admitting: Family Medicine

## 2014-06-17 ENCOUNTER — Ambulatory Visit (INDEPENDENT_AMBULATORY_CARE_PROVIDER_SITE_OTHER): Payer: PRIVATE HEALTH INSURANCE | Admitting: Family Medicine

## 2014-06-17 VITALS — BP 134/85 | HR 86 | Temp 98.1°F | Resp 14 | Ht 68.0 in | Wt 205.4 lb

## 2014-06-17 DIAGNOSIS — M25562 Pain in left knee: Secondary | ICD-10-CM

## 2014-06-17 DIAGNOSIS — M25569 Pain in unspecified knee: Secondary | ICD-10-CM

## 2014-06-17 DIAGNOSIS — Z79899 Other long term (current) drug therapy: Secondary | ICD-10-CM

## 2014-06-17 DIAGNOSIS — I10 Essential (primary) hypertension: Secondary | ICD-10-CM

## 2014-06-17 MED ORDER — AMLODIPINE BESYLATE 5 MG PO TABS: 5.0000 mg | ORAL_TABLET | Freq: Every day | ORAL | Status: DC

## 2014-06-17 NOTE — Patient Instructions (Signed)
Systolic pressure of 350 to 093 or diastolic pressure of 80 to 89: Follow up with me in 6 mos.  Systolic pressure of 818 to 299 or diastolic pressure of 90 to 100: Follow up with me within 1 months.  Systolic pressure above 371 or diastolic pressure above 696: Follow up with your caregiver within 1 week.  Systolic pressure above 789 or diastolic pressure above 381: Come to clinic immediately for evaluation.  Systolic pressure above 017 or diastolic pressure above 510: Go to ER immediately for evaluation and treatment - consider calling 911 if you feel abnormal at all (chest pain, headache, vision change, lightheaded, etc).  Have your daughter check your blood pressure or buy an automatic cuff. Check 1-2x/wk and call with ranges after several weeks so we can see if we need to adjust the dose or the medication. If you get swelling in your legs, we will need to change the medication.  If your cough does not go away, come back as we will need to start the evaluation for a chronic cough.  Managing Your High Blood Pressure Blood pressure is a measurement of how forceful your blood is pressing against the walls of the arteries. Arteries are muscular tubes within the circulatory system. Blood pressure does not stay the same. Blood pressure rises when you are active, excited, or nervous; and it lowers during sleep and relaxation. If the numbers measuring your blood pressure stay above normal most of the time, you are at risk for health problems. High blood pressure (hypertension) is a long-term (chronic) condition in which blood pressure is elevated. A blood pressure reading is recorded as two numbers, such as 120 over 80 (or 120/80). The first, higher number is called the systolic pressure. It is a measure of the pressure in your arteries as the heart beats. The second, lower number is called the diastolic pressure. It is a measure of the pressure in your arteries as the heart relaxes between beats.   Keeping your blood pressure in a normal range is important to your overall health and prevention of health problems, such as heart disease and stroke. When your blood pressure is uncontrolled, your heart has to work harder than normal. High blood pressure is a very common condition in adults because blood pressure tends to rise with age. Men and women are equally likely to have hypertension but at different times in life. Before age 40, men are more likely to have hypertension. After 58 years of age, women are more likely to have it. Hypertension is especially common in African Americans. This condition often has no signs or symptoms. The cause of the condition is usually not known. Your caregiver can help you come up with a plan to keep your blood pressure in a normal, healthy range. BLOOD PRESSURE STAGES Blood pressure is classified into four stages: normal, prehypertension, stage 1, and stage 2. Your blood pressure reading will be used to determine what type of treatment, if any, is necessary. Appropriate treatment options are tied to these four stages:  Normal  Systolic pressure (mm Hg): below 120.  Diastolic pressure (mm Hg): below 80. Prehypertension  Systolic pressure (mm Hg): 120 to 139.  Diastolic pressure (mm Hg): 80 to 89. Stage1  Systolic pressure (mm Hg): 140 to 159.  Diastolic pressure (mm Hg): 90 to 99. Stage2  Systolic pressure (mm Hg): 160 or above.  Diastolic pressure (mm Hg): 100 or above. RISKS RELATED TO HIGH BLOOD PRESSURE Managing your blood pressure is  an important responsibility. Uncontrolled high blood pressure can lead to:  A heart attack.  A stroke.  A weakened blood vessel (aneurysm).  Heart failure.  Kidney damage.  Eye damage.  Metabolic syndrome.  Memory and concentration problems. HOW TO MANAGE YOUR BLOOD PRESSURE Blood pressure can be managed effectively with lifestyle changes and medicines (if needed). Your caregiver will help you come  up with a plan to bring your blood pressure within a normal range. Your plan should include the following: Education  Read all information provided by your caregivers about how to control blood pressure.  Educate yourself on the latest guidelines and treatment recommendations. New research is always being done to further define the risks and treatments for high blood pressure. Lifestylechanges  Control your weight.  Avoid smoking.  Stay physically active.  Reduce the amount of salt in your diet.  Reduce stress.  Control any chronic conditions, such as high cholesterol or diabetes.  Reduce your alcohol intake. Medicines  Several medicines (antihypertensive medicines) are available, if needed, to bring blood pressure within a normal range. Communication  Review all the medicines you take with your caregiver because there may be side effects or interactions.  Talk with your caregiver about your diet, exercise habits, and other lifestyle factors that may be contributing to high blood pressure.  See your caregiver regularly. Your caregiver can help you create and adjust your plan for managing high blood pressure. RECOMMENDATIONS FOR TREATMENT AND FOLLOW-UP  The following recommendations are based on current guidelines for managing high blood pressure in nonpregnant adults. Use these recommendations to identify the proper follow-up period or treatment option based on your blood pressure reading. You can discuss these options with your caregiver.  Systolic pressure of 237 to 628 or diastolic pressure of 80 to 89: Follow up with your caregiver as directed.  Systolic pressure of 315 to 176 or diastolic pressure of 90 to 100: Follow up with your caregiver within 2 months.  Systolic pressure above 160 or diastolic pressure above 737: Follow up with your caregiver within 1 month.  Systolic pressure above 106 or diastolic pressure above 269: Consider antihypertensive therapy; follow up  with your caregiver within 1 week.  Systolic pressure above 485 or diastolic pressure above 462: Begin antihypertensive therapy; follow up with your caregiver within 1 week. Document Released: 07/08/2012 Document Reviewed: 07/08/2012 [facility] Patient Information 2015 [place]. This information is not intended to replace advice given to you by your health care provider. Make sure you discuss any questions you have with your health care provider.

## 2014-06-19 NOTE — Progress Notes (Signed)
Subjective:    Patient ID: Jacqueline Atkinson, female    DOB: [birth year], 58 y.o.   MRN: [id number]  This chart was scribed for Shawnee Knapp, MD by Rosary Lively, ED scribe. This patient was seen in room Room/bed 25 and the patient's care was started at 3:33 PM.  Chief Complaint  Patient presents with  . other    pt would like to dicuss her medications      HPI HPI Comments:  Jacqueline Atkinson is a 58 y.o. female who presents to the [facility].  BP Medication: Pt reports that she has a persistent dry cough, that is worse at night, and sometimes wakes her up. Pt believes that this may be attributed to her Lisinopril. Pt reports that she experiences light headedness when she attempts to get up from her yoga mat. She reports that it only happens if she has been active on the floor, and she goes to get up.    DAT Scan: Pt reports that she wants to get more answers about what is actually going on with her Parkinson's because her case may actually be a-typical. Pt reports that she is supposed to have a scan to look at levels of dopamine in brain on 06/23/2014. Pt reports that she has noticed increased tremors.   Visual Disturbance: Pt has noticed that her vision has blurred.   Knee Pain: Pt reports that she has been exercising, but within the last 10 days to 14 days she has noticed increased knee pain. Pt reports that she has been unable to do her exercises due to discomfort. Pt reports that pain may be as a result of her walking pattern, and recent falls.   Indocin: Pt reports that the indocin has not provided a significant amount of relief. She reports that if she misses a dose, she does not particularly notice a difference.   Klonopin: Pt reports that she sleeps 4 to 5 hours and then awakes. Pt takes PRN.  Pt had CMP five months ago.    Past Medical History  Diagnosis Date  . Allergy   . Arthritis   . Hypertension   . Anxiety   . Hyperlipidemia    Current Outpatient Prescriptions on File Prior  to Visit  Medication Sig Dispense Refill  . acetaminophen (TYLENOL) 500 MG tablet Take 1,000 mg by mouth 2 (two) times daily as needed for pain.      . Carbidopa-Levodopa ER (SINEMET CR) 25-100 MG tablet controlled release Take 1 tablet by mouth 2 (two) times daily. 1 tab at 8am/1 tab 8 pm      . Cholecalciferol (VITAMIN D3) 5000 UNITS CAPS Take 5,000 mg by mouth every evening.      . clonazePAM (KLONOPIN) 0.5 MG tablet Take 0.5 tablets (0.25 mg total) by mouth at bedtime.  30 tablet  2  . cyclobenzaprine (FLEXERIL) 10 MG tablet Take 1 tablet (10 mg total) by mouth 3 (three) times daily as needed for muscle spasms.  30 tablet  6  . indomethacin (INDOCIN) 25 MG capsule Take 25-50 mg by mouth 2 (two) times daily with a meal. 2 tablets (50 mg) every morning and 1 tablet (25 mg) every evening      . Red Yeast Rice Extract (RED YEAST RICE PO) Take by mouth daily.      . Coenzyme Q10 (CO Q 10) 100 MG CAPS Take 100 mg by mouth daily.        No current facility-administered medications on file prior to  visit.   Allergies  Allergen Reactions  . Sulfa Antibiotics     Long time ago and does not remember reaction    Review of Systems  Eyes: Positive for visual disturbance.  Respiratory: Positive for cough.   Genitourinary: Positive for frequency. Negative for urgency.  Musculoskeletal: Positive for arthralgias.       Objective:   Physical Exam  Nursing note and vitals reviewed. Constitutional: She is oriented to person, place, and time. She appears well-developed and well-nourished.  HENT:  Head: Normocephalic and atraumatic.  Eyes: EOM are normal.  Neck: Normal range of motion. Neck supple.  Cardiovascular: Normal rate and regular rhythm.   Murmur heard.  Systolic murmur is present with a grade of 2/6  Pulmonary/Chest: Effort normal.  Musculoskeletal: Normal range of motion.  Neurological: She is alert and oriented to person, place, and time.  Skin: Skin is warm and dry.  Psychiatric:  She has a normal mood and affect. Her behavior is normal.    BP 134/85  Pulse 86  Temp(Src) 98.1 F (36.7 C) (Oral)  Resp 14  Ht 5\' 8"  (1.727 m)  Wt 205 lb 6.4 oz (93.169 kg)  BMI 31.24 kg/m2  SpO2 97%      Assessment & Plan:   Essential hypertension - suspect cough from lisinopril. Also w/ urinary freq so would like to get off the diuretic - will d/c lisinopril/hctz and try norvasc. Check Bp 1-2x/wk outside office and call w/ BP range before amlodipine refill.  Having orthostatic sxs so do not want BP to low.  Left knee pain - pt plans to have eval by ortho.  Encounter for long-term (current) use of other medications - rec cmp at f/u.  Meds ordered this encounter  Medications  . amLODipine (NORVASC) 5 MG tablet    Sig: Take 1 tablet (5 mg total) by mouth daily.    Dispense:  30 tablet    Refill:  1    I personally performed the services described in this documentation, which was scribed in my presence. The recorded information has been reviewed and considered, and addended by me as needed.  [name], MD MPH

## 2014-06-24 ENCOUNTER — Ambulatory Visit (INDEPENDENT_AMBULATORY_CARE_PROVIDER_SITE_OTHER): Payer: PRIVATE HEALTH INSURANCE | Admitting: Neurology

## 2014-06-24 ENCOUNTER — Encounter: Payer: Self-pay | Admitting: Neurology

## 2014-06-24 VITALS — BP 128/68 | HR 72 | Resp 14 | Ht 68.0 in | Wt 211.0 lb

## 2014-06-24 DIAGNOSIS — G238 Other specified degenerative diseases of basal ganglia: Secondary | ICD-10-CM

## 2014-06-24 DIAGNOSIS — R1310 Dysphagia, unspecified: Secondary | ICD-10-CM

## 2014-06-24 NOTE — Progress Notes (Signed)
Jacqueline Atkinson was seen today in the movement disorders clinic for neurologic consultation at the request of [name].   The patient presents today to the movement disorder clinic at [facility] neurology for consultation regarding possible Parkinson's disease.  I had the opportunity to review records from [name], her prior neurologist.  It appears that the patient has been seeing [name] since 12/29/2013.  Patient reports that she just has not been the same ever since she had a fall in 2011.  Her R foot got caught in a stool and she fell.  She was subsequently placed under the care of orthopedic surgeons and intermittently would go to have fluid drained from her knee.   She developed a R leg drag.  She, therefore, initially attributed difficulty in walking to this injury.  She states that later on, she developed balance issues (cannot say how long that took to develop).  Over the course of time, however, she noticed decreased ability to use the right hand in order to write and began to use the left hand for more tasks.  She stated that she would have to reach across her body to use the mouse (noted that as of Christmas, 2014).  In Feb, she was at work and had a bad HA and then had a CT and subsequent MRI of the brain as they saw a meningioma and so she was referred to [name].  She saw [name] on 12/29/2013.  About a week later on 01/06/2014 she followed up with [name] who had recommended an EMG and this was done and was normal.  On 01/17/2014 [name] started her on levodopa and referred her for another opinion to [facility].  She has not yet had that appointment, but it is scheduled for 06/15/2014.  Pt states that when she first got to the pharmacy, there was a RX there for both and IR and CR tablet (she now thinks that it was accident)  She only picked up the CR and took it bid and stated that it helped her feel "looser" and more steady.  When she f/u with [name], she realized that she was supposed to be on  the IR but when she tried that, she had a lot of nausea and even emesis (tried it with carbs even but initially started with 2 po tid; then went to 1 po 6 times per day and didn't have as much emesis but still had to have the carbs with it and she didn't like all the carb intake).  Therefore, she went back to the CR version on her own and she is only on one tablet at 8am/8pm. She presents today for another opinion.  06/24/14 update:  Pt presents for f/u.  DaT scan was done yesterday.  There was significant reduction in uptake of the radiotracer in the bilateral putamen with activity confined to the caudate.  Pt remains on carbidopa/levodopa 25/100 CR at 8am/8pm.  She has been having more knee pain and went to [place] [facility].  She was told that she needs an MRI but she decided that she didn't want to do an MRI if she wasn't going to have surgery and she didn't want to consider that.  She was given klonopin for insomnia since last visit.  She rarely takes it and said that she only took it a few times.  One of the reasons for insomnia is stiffness associated with the back pain.  Klonopin does help her sleep  when she takes it.  She had one fall since last visit; she was going up the steps and her knee buckled and fell.  She is not sure if her knee caused her to fall.  She is exercising; she is doing yoga twice per week.  She is now able to get off of the floor which she didn't used to be able to do.  She is getting ready to start the [facility] exercise class.    Neuroimaging has previously been performed.  It is available for my review today.  PREVIOUS MEDICATIONS: Sinemet and Sinemet CR  ALLERGIES:   Allergies  Allergen Reactions  . Sulfa Antibiotics     Long time ago and does not remember reaction    CURRENT MEDICATIONS:  Current Outpatient Prescriptions on File Prior to Visit  Medication Sig Dispense Refill  . acetaminophen (TYLENOL) 500 MG tablet Take 1,000 mg by mouth 2 (two) times daily as needed for pain.       Marland Kitchen amLODipine (NORVASC) 5 MG tablet Take 1 tablet (5 mg total) by mouth daily.  30 tablet  1  . Carbidopa-Levodopa ER (SINEMET CR) 25-100 MG tablet controlled release Take 1 tablet by mouth 2 (two) times daily. 1 tab at 8am/1 tab 8 pm      . Cholecalciferol (VITAMIN D3) 5000 UNITS CAPS Take 5,000 mg by mouth every evening.      . clonazePAM (KLONOPIN) 0.5 MG tablet Take 0.5 tablets (0.25 mg total) by mouth at bedtime.  30 tablet  2  . cyclobenzaprine (FLEXERIL) 10 MG tablet Take 1 tablet (10 mg total) by mouth 3 (three) times daily as needed for muscle spasms.  30 tablet  6  . indomethacin (INDOCIN) 25 MG capsule Take 50 mg by mouth 2 (two) times daily with a meal.       . Red Yeast Rice Extract (RED YEAST RICE PO) Take by mouth daily.       No current facility-administered medications on file prior to visit.    PAST MEDICAL HISTORY:   Past Medical History  Diagnosis Date  . Allergy   . Arthritis   . Hypertension   . Anxiety   . Hyperlipidemia     PAST SURGICAL HISTORY:   Past Surgical History  Procedure Laterality Date  . Foot surgery  2011    3rd metatarsal LT foot  . Tonsillectomy and adenoidectomy      SOCIAL HISTORY:   History   Social History  . Marital Status: Divorced    Spouse Name: N/A    Number of Children: 7  . Years of Education: 12   Occupational History  . ACTIVITY COORDINATOR    Social History Main Topics  . Smoking status: Former Smoker    Quit date: 03/14/1988  . Smokeless tobacco: Never Used  . Alcohol Use: No  . Drug Use: No  . Sexual Activity: Not on file   Other Topics Concern  . Not on file   Social History Narrative  . No narrative on file    FAMILY HISTORY:   Family Status  Relation Status Death Age  . Mother Deceased 13    diabetes, kidney failure, melanoma  . Father Deceased 51    COPD  . Brother Deceased     brain cancer  . Sister Alive     healthy  . Daughter Alive     healthy  . Daughter Alive     healthy  .  Daughter Alive  healthy  . Daughter Alive     healthy  . Daughter Alive     healthy  . Son Alive     healthy  . Son Alive     healthy    ROS:  A complete 10 system review of systems was obtained and was unremarkable apart from what is mentioned above.  PHYSICAL EXAMINATION:    VITALS:   Filed Vitals:   06/24/14 1251  BP: 128/68  Pulse: 72  Resp: 14  Height: 5\' 8"  (1.727 m)  Weight: 211 lb (95.709 kg)    GEN:  The patient appears stated age and is in NAD. HEENT:  Normocephalic, atraumatic.  The mucous membranes are moist. The superficial temporal arteries are without ropiness or tenderness. CV:  RRR Lungs:  CTAB Neck/HEME:  There are no carotid bruits bilaterally.  Neurological examination:  Orientation: The patient is alert and oriented x3. Fund of knowledge is appropriate.  Recent and remote memory are intact.  Attention and concentration are normal.    Able to name objects and repeat phrases. Cranial nerves: There is good facial symmetry.   The visual fields are full to confrontational testing. The speech is fluent and clear. Soft palate rises symmetrically and there is no tongue deviation. Hearing is intact to conversational tone. Sensation: Sensation is intact to light touch throughout. Motor: Strength is 5/5 in the bilateral upper and lower extremities.   Shoulder shrug is equal and symmetric.  There is no pronator drift. Deep tendon reflexes: Deep tendon reflexes are 3+/4 at the bilateral biceps, triceps, brachioradialis, patella and achilles. Plantar responses are upgoing bilaterally.  There is a Hoffmans response on the L  Movement examination: Tone: There is increased tone in the right upper extremity, moderate.  Tone in the RLE is moderately increased.  The tone in the LUE/LLE is normal Abnormal movements: very mild tremor can be felt with counting backward and a more jerky, nonrhythmic tremor is rarely seen Coordination:  There is slowness with RAM's in the  RUE and also the LUE today Gait and Station: The patient has mild difficulty arising out of a deep-seated chair without the use of the hands. I  Gait is antalgic.  She drags the R leg and doesn't swing the R arm at all.  Doesn't shuffle.        ASSESSMENT/PLAN:  1.  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  DaT scan was abnormal on 06/23/14.      -I think that she needs a higher dose of carbidopa/levodopa 25/100 and would like to again try to transition her back to the immediate release formulation.  She is nervous, as she had nausea and emesis with this last time, but when she transitioned back last time she went immediately to 2 tablets 3 times per day and I do not want workup that fast.  For now, I will continue her carbidopa/levodopa CR 25/100 at 8 AM and 8 PM, but will add a carbidopa/levodopa 25/100 IR, half a tablet at noon for 2 weeks and then increase to one tablet at noon.  I hope is to transition her completely to the eye or formulation and get her comfortable with it, without making her nauseated.  I think we can do this if we do it slowly, but think that she will need a much higher dose.  -Pt education and counseling provided for much greater than 50% of the visit.  She was given information on prognosis.  We talked about the cure PSP  website, which is the foundation that supports [employer].  Total visit time was 45 minutes.  -Encouraged continued exercise. 2.  Dysphagia.  -Overall mild, but we will order a modified barium swallow. 3.  followup in the next few months, sooner should new neurologic issues arise.  u

## 2014-06-24 NOTE — Patient Instructions (Addendum)
1. Continue Carbidopa Levodopa CR - 1 tablet at 8:00 am and 1 tablet at 8:00 pm. Start Carbidopa Levodopa IR 1/2 tablet at noon for 2 weeks, then increase to 1 tablet. Call us in 1 month and let us know how you are doing.  2. We will call you with an appt for your Modified Barium Swallow.  3. The Cure PSP website is: [email or web address]

## 2014-06-27 ENCOUNTER — Telehealth: Payer: Self-pay

## 2014-06-27 ENCOUNTER — Other Ambulatory Visit (HOSPITAL_COMMUNITY): Payer: Self-pay | Admitting: Neurology

## 2014-06-27 DIAGNOSIS — R1310 Dysphagia, unspecified: Secondary | ICD-10-CM

## 2014-06-27 MED ORDER — LOSARTAN POTASSIUM 100 MG PO TABS: 50.0000 mg | ORAL_TABLET | Freq: Every day | ORAL | Status: DC

## 2014-06-27 NOTE — Telephone Encounter (Signed)
Pt states her blood pressure is steadily going up on the new bp meds, this am it was 171/109 After taking meds  Best phone for pt is [place] [facility]

## 2014-06-27 NOTE — Telephone Encounter (Signed)
lmom for pt to cb concerning BP, please RTC for evaluation or go to the ED if any symptoms occur.

## 2014-06-27 NOTE — Telephone Encounter (Signed)
Has been staying high with change of medication, she has a persistent HA on the top of her head Denies chest pain, nausea, blurred vision, numbness or tingling in arm or legs no new back pain, no dizziness.  Lisinopril HCTZ  Was changed due to cough.  27th - 128/68,   28th - 153/82 ,  30th -159/98,  This am 164/93, 171/109 2 hours later.  Takes all medications together in the morning. Please advise

## 2014-06-27 NOTE — Telephone Encounter (Signed)
Is she still having the cough? Any dizziness or lightheadedness with position change?  Called in some losartan to her pharmacy for her to take in addition to the amlodipine.

## 2014-06-28 ENCOUNTER — Telehealth: Payer: Self-pay | Admitting: Neurology

## 2014-06-28 NOTE — Telephone Encounter (Signed)
[facility] for [facility] Radiology [facility] department at [phone] to mail a copy of DAT SCAN films to [place].

## 2014-06-28 NOTE — Telephone Encounter (Signed)
Spoke to pt she has started taking the additional medication and reports she feels a lot better today. She will keep a log of BP's and give a call with a report in a few weeks.

## 2014-07-01 ENCOUNTER — Ambulatory Visit (HOSPITAL_COMMUNITY)
Admission: RE | Admit: 2014-07-01 | Discharge: 2014-07-01 | Disposition: A | Discharge status: Home / Self Care | Payer: PRIVATE HEALTH INSURANCE | Source: Ambulatory Visit | Attending: Neurology | Admitting: Neurology

## 2014-07-01 DIAGNOSIS — G20A1 Parkinson's disease without dyskinesia, without mention of fluctuations: Secondary | ICD-10-CM | POA: Insufficient documentation

## 2014-07-01 DIAGNOSIS — E785 Hyperlipidemia, unspecified: Secondary | ICD-10-CM | POA: Insufficient documentation

## 2014-07-01 DIAGNOSIS — R1310 Dysphagia, unspecified: Secondary | ICD-10-CM

## 2014-07-01 DIAGNOSIS — R5383 Other fatigue: Secondary | ICD-10-CM

## 2014-07-01 DIAGNOSIS — F411 Generalized anxiety disorder: Secondary | ICD-10-CM | POA: Insufficient documentation

## 2014-07-01 DIAGNOSIS — G20 Parkinson's disease: Secondary | ICD-10-CM | POA: Insufficient documentation

## 2014-07-01 DIAGNOSIS — I10 Essential (primary) hypertension: Secondary | ICD-10-CM | POA: Diagnosis not present

## 2014-07-01 DIAGNOSIS — R5381 Other malaise: Secondary | ICD-10-CM | POA: Insufficient documentation

## 2014-07-01 NOTE — Procedures (Signed)
Objective Swallowing Evaluation: Modified Barium Swallowing Study  Patient Details  Name: Jacqueline Atkinson MRN: [id number] Date of Birth: 1955-12-26  Today's Date: 07/01/2014 Time: 6644-0347 SLP Time Calculation (min): 15 min  Past Medical History:  Past Medical History  Diagnosis Date  . Allergy   . Arthritis   . Hypertension   . Anxiety   . Hyperlipidemia    Past Surgical History:  Past Surgical History  Procedure Laterality Date  . Foot surgery  2011    3rd metatarsal LT foot  . Tonsillectomy and adenoidectomy     HPI:  Pt is a 58 yo female with PMH significant for Parkinsonism. She presents today to establish her baseline oropharyngeal swallow function at the request of her neurologist. She reports mild fatigue with mastication that occasionally leads to incomplete bolus formation.     Assessment / Plan / Recommendation Clinical Impression  Dysphagia Diagnosis: Within Functional Limits Clinical impression: Pt's oropharyngeal swallow is WFL during this study, with no evidence of dysphagia and no penetration/aspiration observed. Pt does report fatigue with mastication, particularly when consuming tough meats. Pt appears appropriate to continue with refular textures and thin liquids with small bites/sips. Recommend to consume smaller, more frequent meals to reduce effects of fatigue, and to utilize sauce/gravy to make meats more tender. SLP reviewed results and recommendations with the patient, who verbalized her understanding.    Treatment Recommendation  No treatment recommended at this time    Diet Recommendation Regular;Thin liquid   Liquid Administration via: Cup;Straw Medication Administration: Whole meds with liquid Supervision: Patient able to self feed Compensations: Slow rate;Small sips/bites Postural Changes and/or Swallow Maneuvers: Seated upright 90 degrees    Other  Recommendations Oral Care Recommendations: Oral care BID   Follow Up Recommendations  None     Frequency and Duration        Pertinent Vitals/Pain n/a    SLP Swallow Goals     General HPI: Pt is a 58 yo female with PMH significant for Parkinsonism. She presents today to establish her baseline oropharyngeal swallow function at the request of her neurologist. She reports mild fatigue with mastication that occasionally leads to incomplete bolus formation. Type of Study: Modified Barium Swallowing Study Reason for Referral: Objectively evaluate swallowing function Previous Swallow Assessment: none in chart Diet Prior to this Study: Regular;Thin liquids Respiratory Status: Room air History of Recent Intubation: No Behavior/Cognition: Alert;Cooperative;Pleasant mood Oral Cavity - Dentition: Adequate natural dentition Self-Feeding Abilities: Able to feed self Patient Positioning: Upright in chair Baseline Vocal Quality: Clear Volitional Cough: Strong Volitional Swallow: Able to elicit Anatomy: Within functional limits Pharyngeal Secretions: Not observed secondary MBS    Reason for Referral Objectively evaluate swallowing function   Oral Phase Oral Preparation/Oral Phase Oral Phase: WFL   Pharyngeal Phase Pharyngeal Phase Pharyngeal Phase: Within functional limits  Cervical Esophageal Phase    GO    Cervical Esophageal Phase Cervical Esophageal Phase: [facility]    Functional Assessment Tool Used: skilled clinical judgment Functional Limitations: Swallowing Swallow Current Status (Q2595): At least 1 percent but less than 20 percent impaired, limited or restricted Swallow Goal Status [phone]): At least 1 percent but less than 20 percent impaired, limited or restricted Swallow Discharge Status [phone]): At least 1 percent but less than 20 percent impaired, limited or restricted      Germain Osgood, M.A. CCC-SLP [phone]  Germain Osgood 07/01/2014, 11:40 AM

## 2014-07-07 ENCOUNTER — Telehealth: Payer: Self-pay

## 2014-07-07 ENCOUNTER — Encounter: Payer: Self-pay | Admitting: Neurology

## 2014-07-07 NOTE — Telephone Encounter (Signed)
Jacqueline Atkinson - Pt  To keep up with her BP readings for you and she has a week's worth.   06-28-14:  166/96;  06-29-14:  151/89;  06-30-14:  144/89;  07-01-14:  152/92;  07-02-14:  143/90;  07-03-14:  120/82 07-04-14:  137/84;  07-05-14:  158/100  [phone]

## 2014-07-11 MED ORDER — AMLODIPINE BESYLATE 10 MG PO TABS: 10.0000 mg | ORAL_TABLET | Freq: Every day | ORAL | Status: DC

## 2014-07-11 NOTE — Telephone Encounter (Signed)
Dr. Brigitte Pulse,  I read through your OV note from 8/21. Since she tracked her BP readings and they are still elevated sometimes; is she able to get a refill on her amlodipine, or would you like to adjust dose?

## 2014-07-11 NOTE — Telephone Encounter (Signed)
Spoke to pt- advised increase in amlodipine and to rtn call after 1 week of monitoring.

## 2014-07-11 NOTE — Telephone Encounter (Signed)
Lets try to go up to 10mg  on the amlodipine - I would rather her BP be slightly to high than to low but it seems like she has been getting some extremely to high #s this month.  Continue to monitor BP 1-2x/wk and if having sxs of to low BP or BP is consistently >150/95, then please call back - then would switch to continuing on amlodipine 5 but trying to add in arb (had cough on lisinopril).

## 2014-07-11 NOTE — Telephone Encounter (Signed)
Patient called and states that she is almost out of blood pressure medication. She wants to know if she needs to have her current medication renewed (which she did not know the name of) or if she should be put on different medication. Her BP reading from today was 171/101.  [phone]

## 2014-07-12 ENCOUNTER — Telehealth: Payer: Self-pay

## 2014-07-12 NOTE — Telephone Encounter (Signed)
Patient states her BP medication was recently changed to a different medication and she has questions. Patient not sure if she is taking it correctly. Patients call back number is [phone]

## 2014-07-12 NOTE — Telephone Encounter (Signed)
Spoke to pt- she needed clarification on the dose of the new amlodipine that was called in.

## 2014-08-05 ENCOUNTER — Ambulatory Visit: Payer: 59 | Admitting: Family Medicine

## 2014-08-06 ENCOUNTER — Other Ambulatory Visit: Payer: Self-pay | Admitting: Family Medicine

## 2014-08-11 ENCOUNTER — Telehealth: Payer: Self-pay | Admitting: Neurology

## 2014-08-11 NOTE — Telephone Encounter (Signed)
Request for records recv'd via mail from [facility]. Forwarded to HIM at [facility] for processing via [employer] / Sherri S.

## 2014-08-12 ENCOUNTER — Other Ambulatory Visit: Payer: Self-pay

## 2014-08-12 DIAGNOSIS — Z0271 Encounter for disability determination: Secondary | ICD-10-CM

## 2014-09-13 ENCOUNTER — Ambulatory Visit (INDEPENDENT_AMBULATORY_CARE_PROVIDER_SITE_OTHER): Payer: PRIVATE HEALTH INSURANCE | Admitting: Family Medicine

## 2014-09-13 VITALS — BP 116/66 | HR 88 | Temp 98.7°F | Resp 18 | Ht 67.0 in | Wt 205.0 lb

## 2014-09-13 DIAGNOSIS — G238 Other specified degenerative diseases of basal ganglia: Secondary | ICD-10-CM

## 2014-09-13 DIAGNOSIS — M25562 Pain in left knee: Secondary | ICD-10-CM

## 2014-09-13 DIAGNOSIS — K224 Dyskinesia of esophagus: Secondary | ICD-10-CM

## 2014-09-13 MED ORDER — PREDNISONE 10 MG PO TABS: ORAL_TABLET | ORAL | Status: DC

## 2014-09-13 MED ORDER — LIDOCAINE 5 % EX PTCH: 1.0000 | MEDICATED_PATCH | CUTANEOUS | Status: DC

## 2014-09-13 MED ORDER — TRAMADOL HCL 50 MG PO TABS: 50.0000 mg | ORAL_TABLET | Freq: Three times a day (TID) | ORAL | Status: DC | PRN

## 2014-09-13 MED ORDER — DILTIAZEM HCL ER COATED BEADS 240 MG PO CP24: 240.0000 mg | ORAL_CAPSULE | Freq: Every day | ORAL | Status: DC

## 2014-09-13 NOTE — Progress Notes (Addendum)
Subjective:    Patient ID: Jacqueline Atkinson, female    DOB: 21-Apr-1956, 58 y.o.   MRN: [id number] This chart was scribed for [name], MD by Marti Sleigh, Medical Scribe. This patient was seen in Room 10 and the patient's care was started a 12:35 PM.  Chief Complaint  Patient presents with  . Knee Injury    fell saturday   . Knee Pain    left knee    HPI  Past Medical History  Diagnosis Date  . Allergy   . Arthritis   . Hypertension   . Anxiety   . Hyperlipidemia    Allergies  Allergen Reactions  . Sulfa Antibiotics     Long time ago and does not remember reaction   Current Outpatient Prescriptions on File Prior to Visit  Medication Sig Dispense Refill  . acetaminophen (TYLENOL) 500 MG tablet Take 1,000 mg by mouth 2 (two) times daily as needed for pain.    Marland Kitchen amLODipine (NORVASC) 10 MG tablet Take 1 tablet (10 mg total) by mouth daily. 90 tablet 1  . Cholecalciferol (VITAMIN D3) 5000 UNITS CAPS Take 5,000 mg by mouth every evening.    . clonazePAM (KLONOPIN) 0.5 MG tablet Take 0.5 tablets (0.25 mg total) by mouth at bedtime. 30 tablet 2  . cyclobenzaprine (FLEXERIL) 10 MG tablet Take 1 tablet (10 mg total) by mouth 3 (three) times daily as needed for muscle spasms. 30 tablet 6  . indomethacin (INDOCIN) 25 MG capsule Take 50 mg by mouth 2 (two) times daily with a meal.     . losartan (COZAAR) 100 MG tablet TAKE ONE-HALF TABLET BY MOUTH DAILY. 30 tablet 0  . Red Yeast Rice Extract (RED YEAST RICE PO) Take by mouth daily.    . Carbidopa-Levodopa ER (SINEMET CR) 25-100 MG tablet controlled release Take 1 tablet by mouth 2 (two) times daily. 1 tab at 8am/1 tab 8 pm     No current facility-administered medications on file prior to visit.    HPI Comments: Jacqueline Atkinson is a 58 y.o. female with a hx of MSA-C and HTN who presents to [facility] complaining of left knee pain after a recent fall. Pt states that she cannot do any major surgeries until January when she changes insurance. Pt has  iced and elevated leg. Pt endorses associated edema. Pt states she has a torn meniscus. Pt states she has been taking indomethacin with limited relief. Pt states she has had cortisone injections several times in the past. Pt states her orthopedist has given her an injection of prednisone followed by pills for 10 days. Pt very rarely takes her flexeril. Pt endorses occasional nausea and vomiting as a medication side effect. Pt endorses continuing midthoracic pain that radiates anteriorly. Pt endorses GERD symptoms and takes tums. PT uses a motorized scooter regularly.   Pt fell in 2011 onto her right knee and has had right knee pain with current effusions requiring aspirations since then by orthopedics. Pt developed right leg drag. Pt has been seen at [facility] and was told she needed to have an MRI of her knee, but pt elected not to receive the MRI because she did not want to consider surgery. Pt was seen at [name] for evaluation of possible movement disorder, with concerns of possible parkinson's disease and at that time pt was started on sinemet. Pt had a second opinion at [facility], and a third at [facility]. As etiology of her extremity weakness and imbalance has not been  clear. Pt has been diagnosed with MSA-C, and has been continued on sinemet. Pt is also being evaluated for problems swallowing.    Review of Systems  Respiratory: Positive for choking.   Gastrointestinal: Positive for nausea and vomiting.  Musculoskeletal: Positive for arthralgias and gait problem.  Neurological: Positive for weakness.       Objective:  BP 116/66 mmHg  Pulse 88  Temp(Src) 98.7 F (37.1 C) (Oral)  Resp 18  Ht 5\' 7"  (1.702 m)  Wt 205 lb (92.987 kg)  BMI 32.10 kg/m2  SpO2 100%  Physical Exam  Constitutional: She is oriented to person, place, and time. She appears well-developed and well-nourished.  HENT:  Head: Normocephalic and atraumatic.  Eyes: Pupils are equal, round, and reactive to  light.  Neck: Neck supple.  Cardiovascular: Normal rate and regular rhythm.   Murmur heard. 2/6 ejection murmor right upper sternal border.  Pulmonary/Chest: Effort normal and breath sounds normal. No respiratory distress. She has no wheezes.  Neurological: She is alert and oriented to person, place, and time.  Skin: Skin is warm and dry.  Psychiatric: She has a normal mood and affect. Her behavior is normal.  Nursing note and vitals reviewed.      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 - try back lidoderm patch. If pain continues, could consider trial of robaxin or skelaxin and/or topical voltaren  Knee meniscus pain, left - similar has responded well to parenteral steroids in the past so will repeat due to pt request. Pt plans to f/u w/ orthopedics in sev mos to consider whether she wants to cont intra-articular cortisone inj vs arthroscopy.  Continue using cane to ambulate  Esophageal spasm - try diltiazem to help w/ sxs so will need to stop amlodipine. If continues, consider swallow study and/or GI eval.  Continue to monitor BP at home to see if dilt dose needs adjustment.  If no improvement in dysphagia, consider switching back onto norvasc and trial of TCA (like qhs imipramine or trazodone).  Meds ordered this encounter  Medications  . carbidopa-levodopa (SINEMET IR) 25-100 MG per tablet    Sig: Take 1 tablet by mouth 3 (three) times daily. 1-2 tablets  . traMADol (ULTRAM) 50 MG tablet    Sig: Take 1 tablet (50 mg total) by mouth every 8 (eight) hours as needed.    Dispense:  60 tablet    Refill:  1  . lidocaine (LIDODERM) 5 %    Sig: Place 1 patch onto the skin daily. To center of thoracic - greatest area of pain. Remove & Discard patch within 12 hours    Dispense:  30 patch    Refill:  5  . predniSONE (DELTASONE) 10 MG tablet    Sig: 6-6-5-5-4-4-3-3-2-2-1-1 tabs po qd x 12d taper    Dispense:   42 tablet    Refill:  1  . diltiazem (CARTIA XT) 240 MG 24 hr capsule    Sig: Take 1 capsule (240 mg total) by mouth daily.    Dispense:  90 capsule    Refill:  1    I personally performed the services described in this documentation, which was scribed in my presence. The recorded information has been reviewed and considered, and addended by me as needed.  [name], MD MPH

## 2014-09-13 NOTE — Patient Instructions (Signed)
If the medications aren't work - call or email in. We can always try different pain medicine or muscle spasm medicine.  Some people have good result with topical voltaren gel instead of oral anti-inflammatories. Keep an eye on your blood pressure - we can always increase the dose or just switch back to the norvasc (amlodipine) if it doesn't seem to be helping). It is also thought that sometimes evening medications like imipramine or trazodone can help with esophageal spasms.  Esophageal Spasm Esophageal spasm is an uncoordinated contraction of the muscles of the esophagus (the tube which carries food from your mouth to your stomach). Normally, the muscles of the esophagus alternate between contraction and relaxation starting from the top of the esophagus and working down to the bottom. This moves the food from the mouth to the stomach. In esophageal spasm, all the muscles contract at once. This causes pain and fails to move the food along. As a result, you may have trouble swallowing.  Women are more likely than men to have esophageal spasm. The cause of the spasms is not known. Sometimes eating hot or cold foods triggers the condition and this may be due to an overly sensitive esophagus. This is not an infectious disease and cannot be passed to others. SYMPTOMS  Symptoms of esophageal spasm may include: chest pain, burning or pain with swallowing, and difficulty swallowing.  DIAGNOSIS  Esophageal spasm can be diagnosed by a test called manometry (pressure studies of the esophagus). In this test, a special tube is inserted down the esophagus. The tube measures the muscle activity of the esophagus. Abnormal contractions mixed with normal movement helps confirm the diagnosis.  A person with a hypersensitive esophagus may be diagnosed by inflating a long balloon in the person's esophagus. If this causes the same symptoms, preventive methods may work. PREVENTION  Avoid hot or cold foods if that seems to be  a trigger. PROGNOSIS  This condition does not go away, nor is treatment entirely satisfactory. Patients need to be careful of what they eat. They need to continue on medication if a useful one is found. Fortunately, the condition does not get progressively worse as time passes. Esophageal spasm does not usually lead to more serious problems but sometimes the pain can be disabling. If a person becomes afraid to eat they may become malnourished and lose weight.  TREATMENT   A procedure in which instruments of increasing size are inserted through the esophagus to enlarge (dilate) it are used.  Medications that decrease acid-production of the stomach may be used such as proton-pump inhibitors or H2-blockers.  Medications of several types can be used to relax the muscles of the esophagus.  An individual with a hypersensitive esophagus sometimes improves with low doses of medications normally used for depression.  No treatment for esophageal spasm is effective for everyone. Often several approaches will be tried before one works. In many cases, the symptoms will improve, but will not go away completely.  For severe cases, relief is obtained two-thirds of the time by cutting the muscles along the entire length of the esophagus. This is a major surgical procedure.  Your symptoms are usually the best guide to how well the treatment for esophageal spasm works. SIDE EFFECTS OF TREATMENTS  Nitrates can cause headaches and low blood pressure.  Calcium channel blockers can cause:  Feeling sick to your stomach (nausea).  Constipation and other side effects.  Antidepressants can cause side effects that depend on the medication used. HOME CARE  INSTRUCTIONS   Let your caregiver know if problems are getting worse, or if you get food stuck in your esophagus for longer than 1 hour or as directed and are unable to swallow liquid.  Take medications as directed and with permission of your caregiver. Ask  about what to do if a medication seems to get stuck in your esophagus. Only take over-the-counter or prescription medicines for pain, discomfort, or fever as directed by your caregiver.  Soft and liquid foods pass more easily than solid pieces. SEEK IMMEDIATE MEDICAL CARE IF:   You develop severe chest pain, especially if the pain is crushing or pressure-like and spreads to the arms, back, neck, or jaw, or if you have sweating, nausea, or shortness of breath. THIS COULD BE AN EMERGENCY. Do not wait to see if the pain will go away. Get medical help at once. Call 911 or 0 (operator). DO NOT drive yourself to the hospital.  Your chest pain gets worse and does not go away with rest.  You have an attack of chest pain lasting longer than usual despite rest and treatment with the medications your physician has prescribed.  You wake from sleep with chest pain or shortness of breath.  You feel dizzy or faint.  You have chest pain, not typical of your usual pain, caused by your esophagus for which you originally saw your caregiver. MAKE SURE YOU:   Understand these instructions.  Will watch your condition.  Will get help right away if you are not doing well or get worse. Document Released: 01/04/2003 Document Revised: 01/06/2012 Document Reviewed: 01/07/2014 [facility] Patient Information 2015 [place], [place]. This information is not intended to replace advice given to you by your health care provider. Make sure you discuss any questions you have with your health care provider.

## 2014-09-24 ENCOUNTER — Other Ambulatory Visit: Payer: Self-pay | Admitting: Family Medicine

## 2014-09-26 ENCOUNTER — Ambulatory Visit (INDEPENDENT_AMBULATORY_CARE_PROVIDER_SITE_OTHER): Payer: PRIVATE HEALTH INSURANCE | Admitting: Neurology

## 2014-09-26 ENCOUNTER — Encounter: Payer: Self-pay | Admitting: Neurology

## 2014-09-26 ENCOUNTER — Ambulatory Visit: Payer: 59 | Admitting: Neurology

## 2014-09-26 VITALS — BP 108/60 | HR 64 | Ht 67.0 in | Wt 208.0 lb

## 2014-09-26 DIAGNOSIS — M25562 Pain in left knee: Secondary | ICD-10-CM

## 2014-09-26 DIAGNOSIS — G238 Other specified degenerative diseases of basal ganglia: Secondary | ICD-10-CM | POA: Diagnosis not present

## 2014-09-26 NOTE — Progress Notes (Signed)
Jacqueline Atkinson was seen today in the movement disorders clinic for neurologic consultation at the request of [name].   The patient presents today to the movement disorder clinic at [facility] for consultation regarding possible Parkinson's disease.  I had the opportunity to review records from [name], her prior neurologist.  It appears that the patient has been seeing [name] since 12/29/2013.  Patient reports that she just has not been the same ever since she had a fall in 2011.  Her R foot got caught in a stool and she fell.  She was subsequently placed under the care of orthopedic surgeons and intermittently would go to have fluid drained from her knee.   She developed a R leg drag.  She, therefore, initially attributed difficulty in walking to this injury.  She states that later on, she developed balance issues (cannot say how long that took to develop).  Over the course of time, however, she noticed decreased ability to use the right hand in order to write and began to use the left hand for more tasks.  She stated that she would have to reach across her body to use the mouse (noted that as of Christmas, 2014).  In Feb, she was at work and had a bad HA and then had a CT and subsequent MRI of the brain as they saw a meningioma and so she was referred to [name].  She saw [name] on 12/29/2013.  About a week later on 01/06/2014 she followed up with [name] who had recommended an EMG and this was done and was normal.  On 01/17/2014 [name] started her on levodopa and referred her for another opinion to [facility].  She has not yet had that appointment, but it is scheduled for 06/15/2014.  Pt states that when she first got to the pharmacy, there was a RX there for both and IR and CR tablet (she now thinks that it was accident)  She only picked up the CR and took it bid and stated that it helped her feel "looser" and more steady.  When she f/u with [name], she realized that she was supposed to be on  the IR but when she tried that, she had a lot of nausea and even emesis (tried it with carbs even but initially started with 2 po tid; then went to 1 po 6 times per day and didn't have as much emesis but still had to have the carbs with it and she didn't like all the carb intake).  Therefore, she went back to the CR version on her own and she is only on one tablet at 8am/8pm. She presents today for another opinion.  06/24/14 update:  Pt presents for f/u.  DaT scan was done yesterday.  There was significant reduction in uptake of the radiotracer in the bilateral putamen with activity confined to the caudate.  Pt remains on carbidopa/levodopa 25/100 CR at 8am/8pm.  She has been having more knee pain and went to [facility].  She was told that she needs an MRI but she decided that she didn't want to do an MRI if she wasn't going to have surgery and she didn't want to consider that.  She was given klonopin for insomnia since last visit.  She rarely takes it and said that she only took it a few times.  One of the reasons for insomnia is stiffness associated with the back pain.  Klonopin does help her sleep  when she takes it.  She had one fall since last visit; she was going up the steps and her knee buckled and fell.  She is not sure if her knee caused her to fall.  She is exercising; she is doing yoga twice per week.  She is now able to get off of the floor which she didn't used to be able to do.  She is getting ready to start the [facility] exercise class.   09/26/14 update:  Pt returns today for f/u.  I was trying to transition her slowly to the IR formulation of levodopa, which she has had trouble tolerating in the past.  Last visit, I asked her to try to take carbidopa/levodopa 25/100 CR at 8am and 8pm and carbidopa/levodopa 25/100 IR at noon.  She actually moved up the dosage on her own and she is taking carbidopa/levodopa 25/100, 2 at 8am, 2 at noon, 2 at 4pm, and 2 at 8pm.  She may miss one of those dosages and may  only get in 6 pills per day.  She gets really nauseated after 10 minutes but then it gets better if she can get throught that 10 minutes.   MBE was performed on 07/01/14 and it was normal and a regular, thin liquid diet was recommended.  She did got to UC after a fall on knee on 09/13/14. She needs a knee surgery but had to wait until insurance changes.   She is exercising; she is doing yoga twice per week.  C/o a pain in the mid thoracic region that wakes her up in the middle of the night.  Lidoderm has helped but it was very expensive.  Reviewed MRI report from [facility] in 12/2013 and it was normal.  Having more urinary incontinence.  Mood is really good; feels like she is at best place that she has been in virtually all her life.  Neuroimaging has previously been performed.  It is available for my review today.  PREVIOUS MEDICATIONS: Sinemet and Sinemet CR  ALLERGIES:   Allergies  Allergen Reactions  . Sulfa Antibiotics     Long time ago and does not remember reaction    CURRENT MEDICATIONS:  Current Outpatient Prescriptions on File Prior to Visit  Medication Sig Dispense Refill  . acetaminophen (TYLENOL) 500 MG tablet Take 1,000 mg by mouth 2 (two) times daily as needed for pain.    . carbidopa-levodopa (SINEMET IR) 25-100 MG per tablet Take 2 tablets by mouth 3 (three) times daily. 1-2 tablets    . Cholecalciferol (VITAMIN D3) 5000 UNITS CAPS Take 1,000 Units by mouth daily.     . clonazePAM (KLONOPIN) 0.5 MG tablet Take 0.5 tablets (0.25 mg total) by mouth at bedtime. 30 tablet 2  . cyclobenzaprine (FLEXERIL) 10 MG tablet Take 1 tablet (10 mg total) by mouth 3 (three) times daily as needed for muscle spasms. 30 tablet 6  . diltiazem (CARTIA XT) 240 MG 24 hr capsule Take 1 capsule (240 mg total) by mouth daily. 90 capsule 1  . indomethacin (INDOCIN) 25 MG capsule Take 50 mg by mouth 2 (two) times daily with a meal.     . lidocaine (LIDODERM) 5 % Place 1 patch onto the skin daily. To  center of thoracic - greatest area of pain. Remove & Discard patch within 12 hours 30 patch 5  . losartan (COZAAR) 100 MG tablet TAKE ONE-HALF TABLET BY MOUTH ONCE DAILY 30 tablet 5  . Red Yeast Rice Extract (RED YEAST RICE  PO) Take by mouth daily.    . traMADol (ULTRAM) 50 MG tablet Take 1 tablet (50 mg total) by mouth every 8 (eight) hours as needed. 60 tablet 1   No current facility-administered medications on file prior to visit.    PAST MEDICAL HISTORY:   Past Medical History  Diagnosis Date  . Allergy   . Arthritis   . Hypertension   . Anxiety   . Hyperlipidemia     PAST SURGICAL HISTORY:   Past Surgical History  Procedure Laterality Date  . Foot surgery  2011    3rd metatarsal LT foot  . Tonsillectomy and adenoidectomy      SOCIAL HISTORY:   History   Social History  . Marital Status: Divorced    Spouse Name: N/A    Number of Children: 7  . Years of Education: 12   Occupational History  . ACTIVITY COORDINATOR    Social History Main Topics  . Smoking status: Former Smoker    Quit date: 03/14/1988  . Smokeless tobacco: Never Used  . Alcohol Use: No  . Drug Use: No  . Sexual Activity: Not on file   Other Topics Concern  . Not on file   Social History Narrative    FAMILY HISTORY:   Family Status  Relation Status Death Age  . Mother Deceased 39    diabetes, kidney failure, melanoma  . Father Deceased 42    COPD  . Brother Deceased     brain cancer  . Sister Alive     healthy  . Daughter Alive     healthy  . Daughter Alive     healthy  . Daughter Alive     healthy  . Daughter Alive     healthy  . Daughter Alive     healthy  . Son Alive     healthy  . Son Alive     healthy    ROS:  A complete 10 system review of systems was obtained and was unremarkable apart from what is mentioned above.  PHYSICAL EXAMINATION:    VITALS:   Filed Vitals:   09/26/14 1342  BP: 108/60  Pulse: 64  Height: 5\' 7"  (1.702 m)  Weight: 208 lb (94.348 kg)      GEN:  The patient appears stated age and is in NAD. HEENT:  Normocephalic, atraumatic.  The mucous membranes are moist. The superficial temporal arteries are without ropiness or tenderness. CV:  RRR Lungs:  CTAB Neck/HEME:  There are no carotid bruits bilaterally.  Neurological examination:  Orientation: The patient is alert and oriented x3. Fund of knowledge is appropriate.  Recent and remote memory are intact.  Attention and concentration are normal.    Able to name objects and repeat phrases. Cranial nerves: There is good facial symmetry.   The visual fields are full to confrontational testing. The speech is fluent and clear. Soft palate rises symmetrically and there is no tongue deviation. Hearing is intact to conversational tone. Sensation: Sensation is intact to light touch throughout. Motor: Strength is 5/5 in the bilateral upper and lower extremities.   Shoulder shrug is equal and symmetric.  There is no pronator drift. Deep tendon reflexes: Deep tendon reflexes are 3+/4 at the bilateral biceps, triceps, brachioradialis, patella and achilles. Plantar responses are upgoing bilaterally.  There is a Hoffmans response on the L  Movement examination: Tone: There is increased tone in the right upper extremity, mild to moderate.  Tone in the RLE is normal.  The tone in the LUE/LLE is normal Abnormal movements: No tremor noted today, but I did see dyskinesia when she was concentrating on performing other tasks. Coordination:  She had no decremation with rapid alternating movements today. Gait and Station: The patient has mild difficulty arising out of a deep-seated chair without the use of the hands.  Gait is antalgic.  She is no longer dragging the right leg, but gait is very abnormal.  She has significant postural instability.  She is also bowlegged.  Doesn't shuffle.        ASSESSMENT/PLAN:  1.  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  DaT scan was abnormal on 06/23/14.      -She  is now on carbidopa/levodopa 25/100, 2 tablets 3-4 times per day.  She does have some nausea.  We talked about trying to change to R[place], but she isn't interested right now.  She only has about 10 minutes of nausea, which is the worst in the morning.  She is getting ready to change insurance and she does not know how this will affect her medication.  -We talked about the importance of safe, cardiovascular exercise.  Greater than 50% of the 30 minute visit was spent in counseling. 2.  Dysphagia.  -MBE was performed on 07/01/14 and it was normal and a regular, thin liquid diet was recommended. 3.  Knee pain.    -She likely will have knee surgery in the upcoming year.  I certainly have no objection to that.  If it is possible, it would be best to be done under epidural anesthesia.  If she needs antinausea medication, I would recommend Zofran over Phenergan.   4.  followup in the next few months, sooner should new neurologic issues arise.

## 2014-09-26 NOTE — Patient Instructions (Signed)
1. Take your Carbidopa Levodopa on the morning of surgery.  2. If you need a medication for nausea after surgery use zofran instead of phenergan.

## 2014-10-12 ENCOUNTER — Telehealth: Payer: Self-pay

## 2014-10-12 NOTE — Telephone Encounter (Signed)
Agree, thanks.  Could be her diltiazem. Ok to stop but then needs OV within the next week to assess BP and see if she needs a different med and check for other etiologies if not resolving off med.

## 2014-10-12 NOTE — Telephone Encounter (Signed)
Patient feels her BP medication has caused her feet swell. Per patient the last two days she can barely walk. Patient not sure if she needs a different medication or if this is being caused by something else. Patient stated she was going to stop taking the medication until she hears back from our office. Patients call back number is [phone]

## 2014-10-12 NOTE — Telephone Encounter (Signed)
Yes, it may be her medications, or something worse/ she should come in as soon as possible. I have called her to advise, she states she will come in to be evaluated.

## 2014-11-01 ENCOUNTER — Other Ambulatory Visit: Payer: Self-pay | Admitting: Neurology

## 2014-11-01 MED ORDER — CLONAZEPAM 0.5 MG PO TABS: 0.5000 mg | ORAL_TABLET | Freq: Every day | ORAL | Status: DC

## 2014-11-01 NOTE — Telephone Encounter (Signed)
Clonazepam refill requested. Per last office note- patient to remain on medication. Refill approved and sent to patient's pharmacy.   

## 2014-11-02 ENCOUNTER — Telehealth: Payer: Self-pay | Admitting: Neurology

## 2014-11-02 NOTE — Telephone Encounter (Signed)
Dr. Carles Collet is off on 01/23/15 and pt did not want to r/s her f/u appt. She will call back later to r/s.

## 2014-11-09 ENCOUNTER — Telehealth: Payer: Self-pay | Admitting: Neurology

## 2014-11-09 NOTE — Telephone Encounter (Signed)
recv'd records request by mail from [facility], requesting medical records. Request forwarded to HIM at [facility] for processing / Sherri S.

## 2014-11-10 ENCOUNTER — Ambulatory Visit: Payer: BLUE CROSS/BLUE SHIELD | Admitting: Occupational Therapy

## 2014-11-10 ENCOUNTER — Ambulatory Visit: Payer: BLUE CROSS/BLUE SHIELD | Attending: Neurology | Admitting: Physical Therapy

## 2014-11-10 DIAGNOSIS — R269 Unspecified abnormalities of gait and mobility: Secondary | ICD-10-CM

## 2014-11-10 DIAGNOSIS — R258 Other abnormal involuntary movements: Secondary | ICD-10-CM

## 2014-11-10 DIAGNOSIS — R279 Unspecified lack of coordination: Secondary | ICD-10-CM

## 2014-11-10 DIAGNOSIS — R29898 Other symptoms and signs involving the musculoskeletal system: Secondary | ICD-10-CM

## 2014-11-10 NOTE — Therapy (Signed)
[facility] [address] [place], [name], [address] Phone: [phone]   Fax:  [phone]  Patient Details  Name: Jacqueline Atkinson MRN: [id number] Date of Birth: 1956-07-08 Referring Provider:  Shawnee Knapp, MD  Encounter Date: 11/10/2014   Occupational Therapy Parkinson's Disease Screen  Physical Performance Test item #2 (simulated eating):  12.59sec  9-hole peg test:    RUE  32.09sec        LUE  21.87sec  Box & Blocks Test:   RUE  51 blocks        LUE  66 blocks  Change in ability to perform ADLs/IADLs:  no  Other Comments:  Fall approx 3 months ago and injured L knee--may need surgery.  Only doing home exercises now due to knee injury (was doing community fitness 6 days/week).  Pt does not require occupational therapy services at this time.  Recommended occupational therapy screen in  approx 6 months.  Pt plans to follow up with orthopedic physician for L knee injury as it is limiting exercise/social participation.   [facility], OTR/L 11/10/2014, 11:39 AM  [facility] [address] [place], [name], [address] Phone: [phone]   Fax:  [phone]

## 2014-11-10 NOTE — Therapy (Signed)
[facility] [address] [place], [name], [address] Phone: [phone]   Fax:  [phone]  Patient Details  Name: Jacqueline Atkinson MRN: [id number] Date of Birth: October 23, 1956 Referring Provider:  Shawnee Knapp, MD  Encounter Date: 11/10/2014 Physical Therapy Parkinson's Disease Screen   Timed Up and Go test: 29.74 sec for 3 sit<>stand, with 2 trials to complete 5x sit<>stand with pt unable to complete 5 reps without UE support  10 meter walk test:  20.78 sec = 1.58 ft/sec  5 time sit to stand test: 20.98 sec  Discussed with patient that numbers appeared to have slowed since discharge from therapy approximately 6 months ago, and PT discussed benefit of PT services to address slowing functional mobility.  However, pt is concerned regarding ongoing pain in left knee, which she feels is limiting her functional mobility more significantly than her Parkinson's at this time.  She would like to follow up with orthopedist to look at left knee and wants to follow back up with PT screen in 6 months.   Recommend Physical Therapy screen in 6 months.    [name] W. 11/10/2014, 8:17 AM  Mady Haagensen, PT 11/11/2014 9:14 AM Phone: [phone] Fax: [facility] [address] [place], [name], [address] Phone: [phone]   Fax:  [phone]

## 2015-01-20 ENCOUNTER — Ambulatory Visit (INDEPENDENT_AMBULATORY_CARE_PROVIDER_SITE_OTHER): Payer: BLUE CROSS/BLUE SHIELD | Admitting: Family Medicine

## 2015-01-20 ENCOUNTER — Encounter: Payer: Self-pay | Admitting: Family Medicine

## 2015-01-20 VITALS — BP 113/75 | HR 97 | Temp 98.2°F | Resp 16 | Ht 68.0 in | Wt 219.0 lb

## 2015-01-20 DIAGNOSIS — N39490 Overflow incontinence: Secondary | ICD-10-CM | POA: Diagnosis not present

## 2015-01-20 DIAGNOSIS — R296 Repeated falls: Secondary | ICD-10-CM | POA: Diagnosis not present

## 2015-01-20 DIAGNOSIS — R609 Edema, unspecified: Secondary | ICD-10-CM | POA: Diagnosis not present

## 2015-01-20 DIAGNOSIS — I10 Essential (primary) hypertension: Secondary | ICD-10-CM | POA: Diagnosis not present

## 2015-01-20 DIAGNOSIS — IMO0001 Reserved for inherently not codable concepts without codable children: Secondary | ICD-10-CM

## 2015-01-20 DIAGNOSIS — G238 Other specified degenerative diseases of basal ganglia: Secondary | ICD-10-CM | POA: Diagnosis not present

## 2015-01-20 DIAGNOSIS — M609 Myositis, unspecified: Secondary | ICD-10-CM

## 2015-01-20 DIAGNOSIS — M791 Myalgia: Secondary | ICD-10-CM | POA: Diagnosis not present

## 2015-01-20 DIAGNOSIS — R011 Cardiac murmur, unspecified: Secondary | ICD-10-CM

## 2015-01-20 DIAGNOSIS — R270 Ataxia, unspecified: Secondary | ICD-10-CM

## 2015-01-20 DIAGNOSIS — Z79899 Other long term (current) drug therapy: Secondary | ICD-10-CM | POA: Diagnosis not present

## 2015-01-20 DIAGNOSIS — Z9181 History of falling: Secondary | ICD-10-CM

## 2015-01-20 DIAGNOSIS — R159 Full incontinence of feces: Secondary | ICD-10-CM

## 2015-01-20 DIAGNOSIS — R010 Benign and innocent cardiac murmurs: Secondary | ICD-10-CM | POA: Diagnosis not present

## 2015-01-20 DIAGNOSIS — E785 Hyperlipidemia, unspecified: Secondary | ICD-10-CM | POA: Diagnosis not present

## 2015-01-20 DIAGNOSIS — M1710 Unilateral primary osteoarthritis, unspecified knee: Secondary | ICD-10-CM

## 2015-01-20 DIAGNOSIS — M129 Arthropathy, unspecified: Secondary | ICD-10-CM | POA: Diagnosis not present

## 2015-01-20 MED ORDER — CLONAZEPAM 0.5 MG PO TABS: 0.5000 mg | ORAL_TABLET | Freq: Every day | ORAL | Status: DC

## 2015-01-20 MED ORDER — CYCLOBENZAPRINE HCL 10 MG PO TABS: 10.0000 mg | ORAL_TABLET | Freq: Three times a day (TID) | ORAL | Status: DC | PRN

## 2015-01-20 MED ORDER — LOSARTAN POTASSIUM 100 MG PO TABS: 100.0000 mg | ORAL_TABLET | Freq: Every day | ORAL | Status: DC

## 2015-01-20 NOTE — Progress Notes (Addendum)
Subjective:    Patient ID: Jacqueline Atkinson, female    DOB: [birth year], 59 y.o.   MRN: [id number] This chart was scribed for [name], MD by [name], Medical Scribe. This patient was seen in Room 28 and the patient's care was started at 9:54 AM.   Chief Complaint  Patient presents with  . ankles swelling    HPI HPI Comments: Jacqueline Atkinson is a 59 y.o. female with a hx of multiple system atrophy C, paresthesia, gait difficulty, back pain, knee pain, and HTN who presents to the [facility] for a follow-up for ankle swelling.  She fell onto her knees and is waiting for knee surgery, but is waiting for her insurance to process. Neurologist thought it was best not to put her under complete sedation and advised trial of epidural only and not using Zofran or Phenergan. She was on amlodipine previously, and was changed to diltiazem due to esophageal spasms; she was to consider swallow study or GI follow-up if symptoms worsened. She was previously on low dose of HCTZ, but stopped 2 years ago as it was causing HA and not effectively controlling her blood pressure. She was also on chlorthalidone previously, this was discontinued because it was exacerbating dehydrating and vertigo as well as cramping of her calf muscles.  Ankle Swelling: The ankle swelling improves overnight, but does not resolve completely. Patient notes she does drink plenty of water throughout the day; she only drinks water except when she goes out to eat, when she will order iced tea.  She does wear compression socks occasionally, but not often because they are difficult to put on. Patient notes getting some swelling above the sock when she wears them, and notes that it is painful where the sock digs in. Patient denies swelling anywhere else in her body.  Hypertension: She has been getting blood pressure readings around 133/70 at home. She does have some balance issues while ambulating due to multiple system atrophy, but  no dizziness or falls after standing up. She does eat a lot of salt and has not been reducing her salt. She states her taste sensation has decreased so she has to add salt in order to taste her food.   Incontinence: Patient has been having some occasional problems with incontinence attributed to the diuretic she is on. She notes that sometimes she does not get to the bathroom fast enough, but does not make full bowel movements when she has these episodes. She has been making about 1 regular bowel movement a day with soft stools, which is normal for her. Patient denies constipation and diarrhea.  Cough: Patient does report having a productive cough. She believes this to be allergy-related.  Shin Pain: Patient notes that she has recently been waking up with cramping shin pains that wake her up from sleep. She will have these pains about 2-3 times a night. She has been taking flexeril, once a night, for this but she is trying to wean off of it to see how she does. She has had this problem before. Patient notes that she cannot extend her foot.  Knee Pain: Patient is scheduled to have knee surgery with Dr. Gladstone Lighter on 4/13. She has been taking Indocin 3 times a day.  Back Pain: She has been using a tens unit with relief to her back pain.   Vitamins/Supplements: Patient takes vitamin D, red yeast rice, and fish oil.  Diet: She states she has been eating a balanced diet,  eating a mediterranean diet at home. She does add a lot of salt to her food because of decreased taste sensation.  Patient is going to an ataxia conference in [place], [place] next week. Her sister will drive.  Past Medical History  Diagnosis Date  . Allergy   . Arthritis   . Hypertension   . Anxiety   . Hyperlipidemia    Current Outpatient Prescriptions on File Prior to Visit  Medication Sig Dispense Refill  . acetaminophen (TYLENOL) 500 MG tablet Take 1,000 mg by mouth 2 (two) times daily as needed for pain.    . carbidopa-levodopa  (SINEMET IR) 25-100 MG per tablet Take 2 tablets by mouth 3 (three) times daily. 1-2 tablets    . Cholecalciferol (VITAMIN D3) 5000 UNITS CAPS Take 1,000 Units by mouth daily.     . indomethacin (INDOCIN) 25 MG capsule Take 50 mg by mouth 2 (two) times daily with a meal.     . lidocaine (LIDODERM) 5 % Place 1 patch onto the skin daily. To center of thoracic - greatest area of pain. Remove & Discard patch within 12 hours 30 patch 5  . Red Yeast Rice Extract (RED YEAST RICE PO) Take by mouth daily.     No current facility-administered medications on file prior to visit.   Allergies  Allergen Reactions  . Sulfa Antibiotics     Long time ago and does not remember reaction    Review of Systems  Constitutional: Positive for activity change and fatigue. Negative for fever, chills, appetite change and unexpected weight change.  HENT: Positive for congestion, postnasal drip and rhinorrhea. Negative for sinus pressure and voice change.   Respiratory: Positive for cough.   Cardiovascular: Positive for leg swelling.  Gastrointestinal: Negative for diarrhea and constipation.  Musculoskeletal: Positive for myalgias, back pain, joint swelling, arthralgias and gait problem.  Skin: Negative for rash.  Allergic/Immunologic: Negative for immunocompromised state.  Neurological: Positive for weakness and light-headedness. Negative for dizziness, syncope, facial asymmetry and speech difficulty.  Hematological: Negative for adenopathy.  Psychiatric/Behavioral: Negative for dysphoric mood. The patient is not nervous/anxious.        Objective:  BP 113/75 mmHg  Pulse 97  Temp(Src) 98.2 F (36.8 C) (Oral)  Resp 16  Ht 5\' 8"  (1.727 m)  Wt 219 lb (99.338 kg)  BMI 33.31 kg/m2  SpO2 98%  Physical Exam  Constitutional: She is oriented to person, place, and time. She appears well-developed and well-nourished. No distress.  HENT:  Head: Normocephalic and atraumatic.  Mouth/Throat: Oropharynx is clear and  moist. No oropharyngeal exudate.  Eyes: Pupils are equal, round, and reactive to light.  Neck: Neck supple.  Cardiovascular: Normal rate and regular rhythm.   Murmur heard. 3/6 ejection murmur, left upper sternal border.  Pulmonary/Chest: Effort normal and breath sounds normal. No respiratory distress. She has no wheezes. She has no rales.  CTAB.  Musculoskeletal: She exhibits edema.  3+ lower extremity edema to knee with moderate pitting. No sacral edema  Neurological: She is alert and oriented to person, place, and time. No cranial nerve deficit.  Slowed, ataxic gait. Took 20 seconds to get up from chair without using arms.  Skin: Skin is warm and dry. No rash noted.  Psychiatric: She has a normal mood and affect. Her behavior is normal.  Vitals reviewed.         Assessment & Plan:   Essential hypertension, benign - increase losartan from 50 to 100 since will need to d/c diltiazem  as likely exacerbating edema  Undiagnosed cardiac murmurs  Myalgia and myositis  Peripheral edema - suspect due to diltiazem so stop - not a good candidate for diuretic - has tried compression socks w/o sig benefit but will retry as edema starts to go down - will hopefully be easier to apply and less painful if edema mild rather than severe  Polypharmacy  Overflow incontinence  Incontinence of bowel  Ataxia  Multiple system atrophy C  At high risk for falls  Arthritis of knee  Hyperlipidemia  Meds ordered this encounter  Medications  . clonazePAM (KLONOPIN) 0.5 MG tablet    Sig: Take 1 tablet (0.5 mg total) by mouth at bedtime.    Dispense:  30 tablet    Refill:  2  . cyclobenzaprine (FLEXERIL) 10 MG tablet    Sig: Take 1 tablet (10 mg total) by mouth 3 (three) times daily as needed for muscle spasms.    Dispense:  30 tablet    Refill:  6  . losartan (COZAAR) 100 MG tablet    Sig: Take 1 tablet (100 mg total) by mouth daily.    Dispense:  90 tablet    Refill:  3  Called in  above rx for klonopin to [facility] as forgot to give hard copy to pt at her appt.  I personally performed the services described in this documentation, which was scribed in my presence. The recorded information has been reviewed and considered, and addended by me as needed.  [name], MD MPH

## 2015-01-20 NOTE — Patient Instructions (Signed)
Peripheral Edema You have swelling in your legs (peripheral edema). This swelling is due to excess accumulation of salt and water in your body. Edema may be a sign of heart, kidney or liver disease, or a side effect of a medication. It may also be due to problems in the leg veins. Elevating your legs and using special support stockings may be very helpful, if the cause of the swelling is due to poor venous circulation. Avoid long periods of standing, whatever the cause. Treatment of edema depends on identifying the cause. Chips, pretzels, pickles and other salty foods should be avoided. Restricting salt in your diet is almost always needed. Water pills (diuretics) are often used to remove the excess salt and water from your body via urine. These medicines prevent the kidney from reabsorbing sodium. This increases urine flow. Diuretic treatment may also result in lowering of potassium levels in your body. Potassium supplements may be needed if you have to use diuretics daily. Daily weights can help you keep track of your progress in clearing your edema. You should call your caregiver for follow up care as recommended. SEEK IMMEDIATE MEDICAL CARE IF:   You have increased swelling, pain, redness, or heat in your legs.  You develop shortness of breath, especially when lying down.  You develop chest or abdominal pain, weakness, or fainting.  You have a fever. Document Released: 11/21/2004 Document Revised: 01/06/2012 Document Reviewed: 11/01/2009 [facility] Patient Information 2015 [place], [place]. This information is not intended to replace advice given to you by your health care provider. Make sure you discuss any questions you have with your health care provider.   Muscle Cramps and Spasms Muscle cramps and spasms occur when a muscle or muscles tighten and you have no control over this tightening (involuntary muscle contraction). They are a common problem and can develop in any muscle. The most common  place is in the calf muscles of the leg. Both muscle cramps and muscle spasms are involuntary muscle contractions, but they also have differences:   Muscle cramps are sporadic and painful. They may last a few seconds to a quarter of an hour. Muscle cramps are often more forceful and last longer than muscle spasms.  Muscle spasms may or may not be painful. They may also last just a few seconds or much longer. CAUSES  It is uncommon for cramps or spasms to be due to a serious underlying problem. In many cases, the cause of cramps or spasms is unknown. Some common causes are:   Overexertion.   Overuse from repetitive motions (doing the same thing over and over).   Remaining in a certain position for a long period of time.   Improper preparation, form, or technique while performing a sport or activity.   Dehydration.   Injury.   Side effects of some medicines.   Abnormally low levels of the salts and ions in your blood (electrolytes), especially potassium and calcium. This could happen if you are taking water pills (diuretics) or you are pregnant.  Some underlying medical problems can make it more likely to develop cramps or spasms. These include, but are not limited to:   Diabetes.   Parkinson disease.   Hormone disorders, such as thyroid problems.   Alcohol abuse.   Diseases specific to muscles, joints, and bones.   Blood vessel disease where not enough blood is getting to the muscles.  HOME CARE INSTRUCTIONS   Stay well hydrated. Drink enough water and fluids to keep your urine clear  or pale yellow.  It may be helpful to massage, stretch, and relax the affected muscle.  For tight or tense muscles, use a warm towel, heating pad, or hot shower water directed to the affected area.  If you are sore or have pain after a cramp or spasm, applying ice to the affected area may relieve discomfort.  Put ice in a plastic bag.  Place a towel between your skin and the  bag.  Leave the ice on for 15-20 minutes, 03-04 times a day.  Medicines used to treat a known cause of cramps or spasms may help reduce their frequency or severity. Only take over-the-counter or prescription medicines as directed by your caregiver. SEEK MEDICAL CARE IF:  Your cramps or spasms get more severe, more frequent, or do not improve over time.  MAKE SURE YOU:   Understand these instructions.  Will watch your condition.  Will get help right away if you are not doing well or get worse. Document Released: 04/05/2002 Document Revised: 02/08/2013 Document Reviewed: 09/30/2012 [facility] Patient Information 2015 [place], [place]. This information is not intended to replace advice given to you by your health care provider. Make sure you discuss any questions you have with your health care provider.

## 2015-01-23 ENCOUNTER — Ambulatory Visit: Payer: PRIVATE HEALTH INSURANCE | Admitting: Neurology

## 2015-02-01 ENCOUNTER — Telehealth: Payer: Self-pay | Admitting: Family Medicine

## 2015-02-01 NOTE — Telephone Encounter (Signed)
Patient states that her BP has been running high since her Losartan dosage was changed. Patient read the following BP readings to me which she recorded over the last several days: 150/98, 173/98, 175/101, 155/99, 167/100, 154/99. Please follow up with patient and let her know what she can do to manage this.   [phone]         Please note: This is a duplicate message. The first message I put in was not routed anywhere.

## 2015-02-01 NOTE — Telephone Encounter (Signed)
Patient states that her BP has been running high since her Losartan dosage was changed. Patient read the following BP readings to me which she recorded over the last several days: 150/98, 173/98, 175/101, 155/99, 167/100, 154/99. Please follow up with patient and let her know what she can do to manage this.   [phone]

## 2015-02-01 NOTE — Telephone Encounter (Signed)
Dr Brigitte Pulse: Please advise.

## 2015-02-02 NOTE — Telephone Encounter (Signed)
Pt states ankle swelling has still been bad. She prefers not to restart diltiazem because she says her ankles were still swollen while she was on that. So if you could send another BP med to her pharmacy that would be great. I told her to continue on the Losartan 100mg . She also says she is having surgery next Wednesday and they won't do the surgery if her BP is elevated. Thanks!

## 2015-02-02 NOTE — Telephone Encounter (Signed)
How has her ankle swelling been?  If it got better - even just initially off the diltiazem - they we will start pt on a different type of BP medication in addition to her losartan.  However, if the ankle swelling didn't improve at all then just restart the diltiazem.  If she just wants to restart the diltiazem today due to her elevated pressures that is fine - but continue on the losartan 100mg  - and then I can send a different BP med to her pharmacy if she does think that the diltiazem was making her ankle swelling worse (as this is the most common side effect of the dilt - but also a common side effect of having uncontrolled HTN. . . . )

## 2015-02-03 ENCOUNTER — Other Ambulatory Visit: Payer: Self-pay | Admitting: Radiology

## 2015-02-03 MED ORDER — FUROSEMIDE 20 MG PO TABS: 20.0000 mg | ORAL_TABLET | Freq: Every day | ORAL | Status: DC

## 2015-02-03 NOTE — Telephone Encounter (Signed)
Spoke with pt. Gave her below message. She prefers lasix. Will call that in.

## 2015-02-03 NOTE — Telephone Encounter (Signed)
Pt called again to check on the status of the rx refill. Her bp is 188/109.

## 2015-02-03 NOTE — Telephone Encounter (Signed)
I reviewed Dr. Raul Del last note.  She didn't tolerate HCTZ or chlorthalidone previously. If we were to try another diuretic (that works by a different mechanism), we would use furosemide (Lasix). I'd start with 20 mg and she'd take 1 each morning.  If she's not interested in that, then we're left with the beta blocker family: I'd recommend Lopressor 25 mg, one each day.  Either way, OK to prescribe #30 of either of the above, no refills. She should follow up on Monday.

## 2015-02-04 NOTE — Telephone Encounter (Signed)
Thanks Chelle - agree with plan - start lasix today - remember to drink plenty of water, do not add any salt, and add some high potassium foods to avoid more muscle cramping. Will cause increased urination. Will give pt a call when I am in office tomorrow to ensure she was able to tolerate it and BPs are coming down.  There is a good chance that lasix may help her lower ext swelling but may not be strong enough to normalize her BP - as it is esp impt to have good BP now so they can do her surgery, I think she may need to add in 1/2 tab of her diltiazem as well - esp as it seems that the dilt was not CAUSING her edema - so ok to add back in esp in the short term - after her surgery, we will have more time to find a different bp med and dose that is effective.

## 2015-02-06 ENCOUNTER — Ambulatory Visit (INDEPENDENT_AMBULATORY_CARE_PROVIDER_SITE_OTHER): Payer: BLUE CROSS/BLUE SHIELD | Admitting: Neurology

## 2015-02-06 ENCOUNTER — Encounter: Payer: Self-pay | Admitting: Neurology

## 2015-02-06 ENCOUNTER — Telehealth: Payer: Self-pay | Admitting: Neurology

## 2015-02-06 VITALS — BP 116/72 | HR 72 | Ht 68.0 in | Wt 215.0 lb

## 2015-02-06 DIAGNOSIS — G238 Other specified degenerative diseases of basal ganglia: Secondary | ICD-10-CM

## 2015-02-06 DIAGNOSIS — R112 Nausea with vomiting, unspecified: Secondary | ICD-10-CM

## 2015-02-06 DIAGNOSIS — M25562 Pain in left knee: Secondary | ICD-10-CM | POA: Diagnosis not present

## 2015-02-06 DIAGNOSIS — R1319 Other dysphagia: Secondary | ICD-10-CM

## 2015-02-06 DIAGNOSIS — F458 Other somatoform disorders: Secondary | ICD-10-CM | POA: Diagnosis not present

## 2015-02-06 NOTE — Progress Notes (Signed)
Jacqueline Atkinson was seen today in the movement disorders clinic for neurologic consultation at the request of [name].   The patient presents today to the movement disorder clinic at [facility] neurolog[facility] for consultation regarding possible Parkinson's disease.  I had the opportunity to review records from [name], her prior neurologist.  It appears that the patient has been seeing [name] since 12/29/2013.  Patient reports that she just has not been the same ever since she had a fall in 2011.  Her R foot got caught in a stool and she fell.  She was subsequently placed under the care of orthopedic surgeons and intermittently would go to have fluid drained from her knee.   She developed a R leg drag.  She, therefore, initially attributed difficulty in walking to this injury.  She states that later on, she developed balance issues (cannot say how long that took to develop).  Over the course of time, however, she noticed decreased ability to use the right hand in order to write and began to use the left hand for more tasks.  She stated that she would have to reach across her body to use the mouse (noted that as of Christmas, 2014).  In Feb, she was at work and had a bad HA and then had a CT and subsequent MRI of the brain as they saw a meningioma and so she was referred to [name].  She saw [name] on 12/29/2013.  About a week later on 01/06/2014 she followed up with [name] who had recommended an EMG and this was done and was normal.  On 01/17/2014 [name] started her on levodopa and referred her for another opinion to [facility].  She has not yet had that appointment, but it is scheduled for 06/15/2014.  Pt states that when she first got to the pharmacy, there was a RX there for both and IR and CR tablet (she now thinks that it was accident)  She only picked up the CR and took it bid and stated that it helped her feel "looser" and more steady.  When she f/u with [name], she realized that she was supposed to be on  the IR but when she tried that, she had a lot of nausea and even emesis (tried it with carbs even but initially started with 2 po tid; then went to 1 po 6 times per day and didn't have as much emesis but still had to have the carbs with it and she didn't like all the carb intake).  Therefore, she went back to the CR version on her own and she is only on one tablet at 8am/8pm. She presents today for another opinion.  06/24/14 update:  Pt presents for f/u.  DaT scan was done yesterday.  There was significant reduction in uptake of the radiotracer in the bilateral putamen with activity confined to the caudate.  Pt remains on carbidopa/levodopa 25/100 CR at 8am/8pm.  She has been having more knee pain and went to [place] [facility].  She was told that she needs an MRI but she decided that she didn't want to do an MRI if she wasn't going to have surgery and she didn't want to consider that.  She was given klonopin for insomnia since last visit.  She rarely takes it and said that she only took it a few times.  One of the reasons for insomnia is stiffness associated with the back pain.  Klonopin does help her sleep  when she takes it.  She had one fall since last visit; she was going up the steps and her knee buckled and fell.  She is not sure if her knee caused her to fall.  She is exercising; she is doing yoga twice per week.  She is now able to get off of the floor which she didn't used to be able to do.  She is getting ready to start the [facility] exercise class.   09/26/14 update:  Pt returns today for f/u.  I was trying to transition her slowly to the IR formulation of levodopa, which she has had trouble tolerating in the past.  Last visit, I asked her to try to take carbidopa/levodopa 25/100 CR at 8am and 8pm and carbidopa/levodopa 25/100 IR at noon.  She actually moved up the dosage on her own and she is taking carbidopa/levodopa 25/100, 2 at 8am, 2 at noon, 2 at 4pm, and 2 at 8pm.  She may miss one of those dosages and may  only get in 6 pills per day.  She gets really nauseated after 10 minutes but then it gets better if she can get throught that 10 minutes.   MBE was performed on 07/01/14 and it was normal and a regular, thin liquid diet was recommended.  She did got to UC after a fall on knee on 09/13/14. She needs a knee surgery but had to wait until insurance changes.   She is exercising; she is doing yoga twice per week.  C/o a pain in the mid thoracic region that wakes her up in the middle of the night.  Lidoderm has helped but it was very expensive.  Reviewed MRI report from [facility] in 12/2013 and it was normal.  Having more urinary incontinence.  Mood is really good; feels like she is at best place that she has been in virtually all her life.  02/06/15 update:  Pt is accompanied by her sister who supplements the history.  The patient has a history of multiple system atrophy, cerebellar type.  She is on carbidopa/levodopa 25/100.  She is supposed to be on 2 tablets by mouth 4 times per day that she seems to have nausea and emesis when she takes it that way, so she is only taking 1 tablet by mouth 4 times per day.  She is also on Indocin twice a day and is hoping to get off of that after her knee surgery.  She is having knee surgery on Wednesday, which she thinks is the primary etiology for some of her gait issues.  She has been awaiting this knee surgery for a long time.  Her orthopedic surgeon did tell her that he was not sure that it was going to be able to help all of her knee issues, including her gait, however.  No falls.  She went to an ataxia conference in [place]. She asks multiple questions re: genetics.  She is planning on applying to enroll in a MSA-C MRI study involving a 7 Tesla magnet.  She will need to travel to [place] for this.  She has had some urinary incontinence, occasional.  No diplopia.  Walks with cane most of the time.  She also complains that food is getting "stuck" in the midepigastric region when  she is hungry or when she is tired.  This can consist of posterior, meat, or a salad.  She will have to walk around and stretch and sometimes will cough the entire bolus up whole.  She had a modified barium swallow in September of last year that was unremarkable.  Neuroimaging has previously been performed.  It is available for my review today.  PREVIOUS MEDICATIONS: Sinemet and Sinemet CR  ALLERGIES:   Allergies  Allergen Reactions  . Sulfa Antibiotics     Long time ago and does not remember reaction    CURRENT MEDICATIONS:  Current Outpatient Prescriptions on File Prior to Visit  Medication Sig Dispense Refill  . acetaminophen (TYLENOL) 500 MG tablet Take 1,000 mg by mouth 2 (two) times daily as needed for pain.    . carbidopa-levodopa (SINEMET IR) 25-100 MG per tablet Take 2 tablets by mouth 4 (four) times daily. 1-2 tablets    . clonazePAM (KLONOPIN) 0.5 MG tablet Take 1 tablet (0.5 mg total) by mouth at bedtime. 30 tablet 2  . cyclobenzaprine (FLEXERIL) 10 MG tablet Take 1 tablet (10 mg total) by mouth 3 (three) times daily as needed for muscle spasms. 30 tablet 6  . furosemide (LASIX) 20 MG tablet Take 1 tablet (20 mg total) by mouth daily. Take one tablet by mouth each morning. 30 tablet 0  . indomethacin (INDOCIN) 25 MG capsule Take 50 mg by mouth 2 (two) times daily with a meal.     . losartan (COZAAR) 100 MG tablet Take 1 tablet (100 mg total) by mouth daily. 90 tablet 3   No current facility-administered medications on file prior to visit.    PAST MEDICAL HISTORY:   Past Medical History  Diagnosis Date  . Allergy   . Arthritis   . Hypertension   . Anxiety   . Hyperlipidemia     PAST SURGICAL HISTORY:   Past Surgical History  Procedure Laterality Date  . Foot surgery  2011    3rd metatarsal LT foot  . Tonsillectomy and adenoidectomy      SOCIAL HISTORY:   History   Social History  . Marital Status: Divorced    Spouse Name: N/A  . Number of Children: 7  .  Years of Education: 12   Occupational History  . ACTIVITY COORDINATOR    Social History Main Topics  . Smoking status: Former Smoker    Quit date: 03/14/1988  . Smokeless tobacco: Never Used  . Alcohol Use: No  . Drug Use: No  . Sexual Activity: Not on file   Other Topics Concern  . Not on file   Social History Narrative    FAMILY HISTORY:   Family Status  Relation Status Death Age  . Mother Deceased 78    diabetes, kidney failure, melanoma  . Father Deceased 64    COPD  . Brother Deceased     brain cancer  . Sister Alive     healthy  . Daughter Alive     healthy  . Daughter Alive     healthy  . Daughter Alive     healthy  . Daughter Alive     healthy  . Daughter Alive     healthy  . Son Alive     healthy  . Son Alive     healthy    ROS:  A complete 10 system review of systems was obtained and was unremarkable apart from what is mentioned above.  PHYSICAL EXAMINATION:    VITALS:   Filed Vitals:   02/06/15 0856  BP: 116/72  Pulse: 72  Height: 5\' 8"  (1.727 m)  Weight: 215 lb (97.523 kg)    GEN:  The patient appears stated age  and is in NAD. HEENT:  Normocephalic, atraumatic.  The mucous membranes are moist. The superficial temporal arteries are without ropiness or tenderness. CV:  RRR Lungs:  CTAB Neck/HEME:  There are no carotid bruits bilaterally.  Neurological examination:  Orientation: The patient is alert and oriented x3. Fund of knowledge is appropriate.  Recent and remote memory are intact.  Attention and concentration are normal.    Able to name objects and repeat phrases. Cranial nerves: There is good facial symmetry.   The visual fields are full to confrontational testing. The speech is fluent and clear. Soft palate rises symmetrically and there is no tongue deviation. Hearing is intact to conversational tone. Sensation: Sensation is intact to light touch throughout. Motor: Strength is 5/5 in the bilateral upper and lower extremities.    Shoulder shrug is equal and symmetric.  There is no pronator drift. Deep tendon reflexes: Deep tendon reflexes are 3+/4 at the bilateral biceps, triceps, brachioradialis, patella and achilles. Plantar responses are upgoing bilaterally.  There is a Hoffmans response on the L  Movement examination: Tone: There is increased tone in the right upper extremity,  moderate.  Tone in the RLE is normal.  The tone in the LUE/LLE is normal Abnormal movements: No tremor noted today and no dyskinesia today. Coordination:  She had no decremation with rapid alternating movements today. Gait and Station: The patient has mild difficulty arising out of a deep-seated chair without the use of the hands.  Gait is antalgic.  She is no longer dragging the right leg, but gait is very abnormal, because of structural abnormalities and bowing of the legs, especially on the left.  She has significant postural instability. Doesn't shuffle.        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      -She is now on carbidopa/levodopa 25/100, but has not been able to tolerate going up more than 1 tablet 4 times per day.  We talked about trying to change to Rytary, but we decided not to do that right before her surgery, which is in 2 days.  Fortunately, that will be done under a local block.  I wonder if some of this nausea isn't coming from the Indocin, and hopefully she will be able to get off of that after knee surgery.  -We talked about the importance of safe, cardiovascular exercise.    -Greater than 50% of the 30 minute visit was spent in counseling.  She is planning to enter an MRI study for patients with her diagnosis.  I am in complete agreement with this.  She and her sister asked me multiple questions about genetics associated with MSA C.  I really do not think she has any genetics, as family history does not indicate any others with similar diseases.  Her mother had some type of brain tumor, but no evidence  of parkinsonism. 2.  Dysphagia.  -MBE was performed on 07/01/14 and it was normal and a regular, thin liquid diet was recommended.  -I am going to send her to Dr. Cristina Gong as the dysphagia she is describing today sounds like it is coming lower down in the GI tract.  ? Need for EGD 3.  Knee pain.    -Surgery on wed under local block 4.  followup in the next few months, sooner should new neurologic issues arise.

## 2015-02-06 NOTE — Telephone Encounter (Signed)
Referral to Dr Cristina Gong faxed to [facility] at [phone] with confirmation received for evaluation of dysphagia. They will contact patient with appt.

## 2015-02-06 NOTE — Telephone Encounter (Signed)
Called pt and LVM about below - ok to cont prn lasix w/ high K diet but if BP still above goal, rec restarting 1/2 to 1 tab of diltiazem as BP was doing great on this and clearly her lower ext edema was NOT from the dilt since it did not resolve when the medicine was discontinued

## 2015-02-07 ENCOUNTER — Encounter (HOSPITAL_COMMUNITY): Payer: Self-pay | Admitting: *Deleted

## 2015-02-07 ENCOUNTER — Other Ambulatory Visit: Payer: Self-pay | Admitting: Surgical

## 2015-02-07 ENCOUNTER — Other Ambulatory Visit: Payer: Self-pay | Admitting: Family Medicine

## 2015-02-07 NOTE — H&P (Signed)
Jacqueline Atkinson is an 59 y.o. female.   Chief Complaint: left knee pain HPI: The patient is a 59 year old female with a long standing history of left knee pain. She has been seen multiple times for this issues. She has pain mostly with weightbearing. No specific injury. No groin pain. She has failed conservative treatment including activity modification and cortisone injections. MRI of the left knee showed tears of both the medial and lateral menisci as well as significant arthritic change.   Past Medical History  Diagnosis Date  . Allergy   . Arthritis   . Hypertension   . Anxiety   . Hyperlipidemia     Past Surgical History  Procedure Laterality Date  . Foot surgery  2011    3rd metatarsal LT foot  . Tonsillectomy and adenoidectomy      Family History  Problem Relation Age of Onset  . Cancer Mother   . Diabetes Mother   . Kidney disease Mother   . COPD Father   . Brain cancer Brother    Social History:  reports that she quit smoking about 26 years ago. She has never used smokeless tobacco. She reports that she does not drink alcohol or use illicit drugs.  Allergies:  Allergies  Allergen Reactions  . Sulfa Antibiotics     Long time ago and does not remember reaction    Current outpatient prescriptions:  .  acetaminophen (TYLENOL) 500 MG tablet, Take 1,000 mg by mouth 2 (two) times daily as needed for pain., Disp: , Rfl:  .  carbidopa-levodopa (SINEMET IR) 25-100 MG per tablet, Take 2 tablets by mouth 4 (four) times daily. 1-2 tablets, Disp: , Rfl:  .  clonazePAM (KLONOPIN) 0.5 MG tablet, Take 1 tablet (0.5 mg total) by mouth at bedtime., Disp: 30 tablet, Rfl: 2 .  cyclobenzaprine (FLEXERIL) 10 MG tablet, Take 1 tablet (10 mg total) by mouth 3 (three) times daily as needed for muscle spasms., Disp: 30 tablet, Rfl: 6 .  furosemide (LASIX) 20 MG tablet, Take 1 tablet (20 mg total) by mouth daily. Take one tablet by mouth each morning., Disp: 30 tablet, Rfl: 0 .  indomethacin  (INDOCIN) 25 MG capsule, Take 50 mg by mouth 2 (two) times daily with a meal. , Disp: , Rfl:  .  losartan (COZAAR) 100 MG tablet, Take 1 tablet (100 mg total) by mouth daily., Disp: 90 tablet, Rfl: 3  Review of Systems  Constitutional: Negative.   HENT: Negative.   Eyes: Negative.   Respiratory: Negative.   Cardiovascular: Negative.   Gastrointestinal: Negative.   Genitourinary: Negative.   Musculoskeletal: Positive for joint pain. Negative for myalgias, back pain, falls and neck pain.       Left knee pain  Skin: Negative.   Neurological: Negative.   Endo/Heme/Allergies: Negative.   Psychiatric/Behavioral: Negative for depression, suicidal ideas, hallucinations, memory loss and substance abuse. The patient is nervous/anxious. The patient does not have insomnia.    Vitals  Weight: 208 lb Height: 68in Body Surface Area: 2.08 m Body Mass Index: 31.63 kg/m  BP: 140/80 (Sitting, Left Arm, Standard) HR: 76 bpm  Physical Exam  Constitutional: She is oriented to person, place, and time. She appears well-developed. No distress.  Obese  HENT:  Head: Normocephalic and atraumatic.  Right Ear: External ear normal.  Left Ear: External ear normal.  Nose: Nose normal.  Mouth/Throat: Oropharynx is clear and moist.  Eyes: Conjunctivae and EOM are normal.  Neck: Normal range of motion. Neck  supple.  Cardiovascular: Normal rate, regular rhythm, normal heart sounds and intact distal pulses.   No murmur heard. Respiratory: Effort normal and breath sounds normal. No respiratory distress. She has no wheezes.  GI: Soft. Bowel sounds are normal. She exhibits no distension. There is no tenderness.  Musculoskeletal:       Right hip: Normal.       Left hip: Normal.       Right knee: Normal.       Left knee: She exhibits decreased range of motion and swelling. She exhibits no effusion and no erythema. Tenderness found. Medial joint line and lateral joint line tenderness noted.  Neurological:  She is alert and oriented to person, place, and time. She has normal strength and normal reflexes. No sensory deficit.  Skin: No rash noted. She is not diaphoretic. No erythema.  Psychiatric: She has a normal mood and affect. Her behavior is normal.     Assessment/Plan Left knee, medial and lateral meniscal tears Primary osteoarthritis, left knee She needs a left knee arthroscopy with debridement of the medial and lateral menisci. Risks and benefits of the surgery discussed with the patient by Dr. Gladstone Lighter. 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   H&P performed by Dr. Latanya Maudlin, MD Documented by Ardeen Jourdain, PA-C  [place], Clydene Burack Ander Purpura 02/07/2015, 10:23 AM

## 2015-02-08 ENCOUNTER — Encounter (HOSPITAL_COMMUNITY): Payer: Self-pay | Admitting: *Deleted

## 2015-02-08 ENCOUNTER — Ambulatory Visit (HOSPITAL_COMMUNITY): Payer: BLUE CROSS/BLUE SHIELD | Admitting: Certified Registered Nurse Anesthetist

## 2015-02-08 ENCOUNTER — Encounter (HOSPITAL_COMMUNITY)
Admission: RE | Disposition: A | Discharge status: Home / Self Care | Payer: Self-pay | Source: Ambulatory Visit | Attending: Orthopedic Surgery

## 2015-02-08 ENCOUNTER — Ambulatory Visit (HOSPITAL_COMMUNITY)
Admission: RE | Admit: 2015-02-08 | Discharge: 2015-02-08 | Disposition: A | Discharge status: Home / Self Care | Payer: BLUE CROSS/BLUE SHIELD | Source: Ambulatory Visit | Attending: Orthopedic Surgery | Admitting: Orthopedic Surgery

## 2015-02-08 DIAGNOSIS — M659 Synovitis and tenosynovitis, unspecified: Secondary | ICD-10-CM | POA: Insufficient documentation

## 2015-02-08 DIAGNOSIS — M179 Osteoarthritis of knee, unspecified: Secondary | ICD-10-CM | POA: Diagnosis not present

## 2015-02-08 DIAGNOSIS — Z87891 Personal history of nicotine dependence: Secondary | ICD-10-CM | POA: Insufficient documentation

## 2015-02-08 DIAGNOSIS — Z79899 Other long term (current) drug therapy: Secondary | ICD-10-CM | POA: Insufficient documentation

## 2015-02-08 DIAGNOSIS — M23232 Derangement of other medial meniscus due to old tear or injury, left knee: Secondary | ICD-10-CM | POA: Diagnosis not present

## 2015-02-08 DIAGNOSIS — M23262 Derangement of other lateral meniscus due to old tear or injury, left knee: Secondary | ICD-10-CM | POA: Diagnosis not present

## 2015-02-08 DIAGNOSIS — I10 Essential (primary) hypertension: Secondary | ICD-10-CM | POA: Diagnosis not present

## 2015-02-08 DIAGNOSIS — F419 Anxiety disorder, unspecified: Secondary | ICD-10-CM | POA: Diagnosis not present

## 2015-02-08 DIAGNOSIS — M2242 Chondromalacia patellae, left knee: Secondary | ICD-10-CM | POA: Insufficient documentation

## 2015-02-08 DIAGNOSIS — K219 Gastro-esophageal reflux disease without esophagitis: Secondary | ICD-10-CM | POA: Diagnosis not present

## 2015-02-08 HISTORY — PX: KNEE ARTHROSCOPY: SHX127

## 2015-02-08 LAB — CBC
HCT: 35.9 % — ABNORMAL LOW (ref 36.0–46.0)
Hemoglobin: 11.1 g/dL — ABNORMAL LOW (ref 12.0–15.0)
MCH: 24.8 pg — ABNORMAL LOW (ref 26.0–34.0)
MCHC: 30.9 g/dL (ref 30.0–36.0)
MCV: 80.3 fL (ref 78.0–100.0)
PLATELETS: 313 10*3/uL (ref 150–400)
RBC: 4.47 MIL/uL (ref 3.87–5.11)
RDW: 16 % — ABNORMAL HIGH (ref 11.5–15.5)
WBC: 7.2 10*3/uL (ref 4.0–10.5)

## 2015-02-08 LAB — BASIC METABOLIC PANEL
Anion gap: 7 (ref 5–15)
BUN: 16 mg/dL (ref 6–23)
CALCIUM: 8.9 mg/dL (ref 8.4–10.5)
CO2: 29 mmol/L (ref 19–32)
Chloride: 104 mmol/L (ref 96–112)
Creatinine, Ser: 0.63 mg/dL (ref 0.50–1.10)
GLUCOSE: 95 mg/dL (ref 70–99)
Potassium: 4 mmol/L (ref 3.5–5.1)
Sodium: 140 mmol/L (ref 135–145)

## 2015-02-08 SURGERY — ARTHROSCOPY, KNEE
Anesthesia: Monitor Anesthesia Care | Site: Knee | Laterality: Left

## 2015-02-08 MED ORDER — LACTATED RINGERS IR SOLN
Status: DC | PRN
  Administered 2015-02-08 (×2): 3000 mL

## 2015-02-08 MED ORDER — PROPOFOL 10 MG/ML IV BOLUS
INTRAVENOUS | Status: DC | PRN
  Administered 2015-02-08: 20 mg via INTRAVENOUS
  Administered 2015-02-08 (×4): 10 mg via INTRAVENOUS

## 2015-02-08 MED ORDER — BUPIVACAINE HCL 0.25 % IJ SOLN
INTRAMUSCULAR | Status: DC | PRN
  Administered 2015-02-08: 27 mL

## 2015-02-08 MED ORDER — FENTANYL CITRATE 0.05 MG/ML IJ SOLN
INTRAMUSCULAR | Status: AC
  Filled 2015-02-08: qty 2

## 2015-02-08 MED ORDER — BACITRACIN-NEOMYCIN-POLYMYXIN 400-5-5000 EX OINT
TOPICAL_OINTMENT | CUTANEOUS | Status: DC | PRN
  Administered 2015-02-08: 1 via TOPICAL

## 2015-02-08 MED ORDER — PROPOFOL 10 MG/ML IV BOLUS
INTRAVENOUS | Status: AC
  Filled 2015-02-08: qty 20

## 2015-02-08 MED ORDER — BUPIVACAINE-EPINEPHRINE (PF) 0.25% -1:200000 IJ SOLN
INTRAMUSCULAR | Status: AC
  Filled 2015-02-08: qty 30

## 2015-02-08 MED ORDER — MIDAZOLAM HCL 5 MG/5ML IJ SOLN
INTRAMUSCULAR | Status: DC | PRN
  Administered 2015-02-08: 2 mg via INTRAVENOUS

## 2015-02-08 MED ORDER — OXYCODONE-ACETAMINOPHEN 10-325 MG PO TABS: 1.0000 | ORAL_TABLET | ORAL | Status: DC | PRN

## 2015-02-08 MED ORDER — BACITRACIN-NEOMYCIN-POLYMYXIN 400-5-5000 EX OINT
TOPICAL_OINTMENT | CUTANEOUS | Status: AC
  Filled 2015-02-08: qty 1

## 2015-02-08 MED ORDER — PROMETHAZINE HCL 25 MG/ML IJ SOLN: 6.2500 mg | INTRAMUSCULAR | Status: DC | PRN

## 2015-02-08 MED ORDER — ROPIVACAINE HCL 5 MG/ML IJ SOLN
INTRAMUSCULAR | Status: AC
  Filled 2015-02-08: qty 30

## 2015-02-08 MED ORDER — BUPIVACAINE-EPINEPHRINE 0.25% -1:200000 IJ SOLN
INTRAMUSCULAR | Status: DC | PRN
  Administered 2015-02-08: 30 mL

## 2015-02-08 MED ORDER — ONDANSETRON HCL 4 MG/2ML IJ SOLN
INTRAMUSCULAR | Status: AC
  Filled 2015-02-08: qty 2

## 2015-02-08 MED ORDER — BUPIVACAINE-EPINEPHRINE (PF) 0.5% -1:200000 IJ SOLN
INTRAMUSCULAR | Status: DC | PRN
  Administered 2015-02-08: 30 mL

## 2015-02-08 MED ORDER — FENTANYL CITRATE 0.05 MG/ML IJ SOLN: 25.0000 ug | INTRAMUSCULAR | Status: DC | PRN

## 2015-02-08 MED ORDER — BUPIVACAINE-EPINEPHRINE (PF) 0.5% -1:200000 IJ SOLN
INTRAMUSCULAR | Status: AC
  Filled 2015-02-08: qty 30

## 2015-02-08 MED ORDER — PROPOFOL INFUSION 10 MG/ML OPTIME
INTRAVENOUS | Status: DC | PRN
  Administered 2015-02-08: 75 ug/kg/min via INTRAVENOUS

## 2015-02-08 MED ORDER — MIDAZOLAM HCL 2 MG/2ML IJ SOLN
INTRAMUSCULAR | Status: AC
  Filled 2015-02-08: qty 2

## 2015-02-08 MED ORDER — BUPIVACAINE HCL (PF) 0.25 % IJ SOLN
INTRAMUSCULAR | Status: AC
  Filled 2015-02-08: qty 30

## 2015-02-08 MED ORDER — LACTATED RINGERS IV SOLN
INTRAVENOUS | Status: DC
  Administered 2015-02-08: 1000 mL via INTRAVENOUS

## 2015-02-08 MED ORDER — CEFAZOLIN SODIUM-DEXTROSE 2-3 GM-% IV SOLR
INTRAVENOUS | Status: AC
  Filled 2015-02-08: qty 50

## 2015-02-08 MED ORDER — FENTANYL CITRATE 0.05 MG/ML IJ SOLN
INTRAMUSCULAR | Status: DC | PRN
  Administered 2015-02-08: 25 ug via INTRAVENOUS
  Administered 2015-02-08: 50 ug via INTRAVENOUS

## 2015-02-08 MED ORDER — CEFAZOLIN SODIUM-DEXTROSE 2-3 GM-% IV SOLR
2.0000 g | INTRAVENOUS | Status: AC
  Administered 2015-02-08: 2 g via INTRAVENOUS

## 2015-02-08 SURGICAL SUPPLY — 27 items
BANDAGE ELASTIC 4 VELCRO ST LF (GAUZE/BANDAGES/DRESSINGS) ×2 IMPLANT
BANDAGE ELASTIC 6 VELCRO ST LF (GAUZE/BANDAGES/DRESSINGS) ×1 IMPLANT
BLADE GREAT WHITE 4.2 (BLADE) IMPLANT
BNDG COHESIVE 6X5 TAN STRL LF (GAUZE/BANDAGES/DRESSINGS) ×2 IMPLANT
BNDG GAUZE ELAST 4 BULKY (GAUZE/BANDAGES/DRESSINGS) ×1 IMPLANT
DRAPE SHEET LG 3/4 BI-LAMINATE (DRAPES) ×2 IMPLANT
DRSG ADAPTIC 3X8 NADH LF (GAUZE/BANDAGES/DRESSINGS) ×1 IMPLANT
DRSG EMULSION OIL 3X3 NADH (GAUZE/BANDAGES/DRESSINGS) ×2 IMPLANT
DRSG PAD ABDOMINAL 8X10 ST (GAUZE/BANDAGES/DRESSINGS) ×4 IMPLANT
DURAPREP 26ML APPLICATOR (WOUND CARE) ×2 IMPLANT
GAUZE SPONGE 4X4 12PLY STRL (GAUZE/BANDAGES/DRESSINGS) ×1 IMPLANT
GLOVE BIOGEL PI IND STRL 8 (GLOVE) ×1 IMPLANT
GLOVE BIOGEL PI INDICATOR 8 (GLOVE) ×1
GLOVE ECLIPSE 8.0 STRL XLNG CF (GLOVE) ×2 IMPLANT
GOWN STRL REUS W/TWL XL LVL3 (GOWN DISPOSABLE) ×2 IMPLANT
KIT BASIN OR (CUSTOM PROCEDURE TRAY) ×2 IMPLANT
MANIFOLD NEPTUNE II (INSTRUMENTS) ×2 IMPLANT
PACK ARTHROSCOPY WL (CUSTOM PROCEDURE TRAY) ×2 IMPLANT
PACK ICE MAXI GEL EZY WRAP (MISCELLANEOUS) ×2 IMPLANT
PAD ABD 8X10 STRL (GAUZE/BANDAGES/DRESSINGS) ×3 IMPLANT
PAD MASON LEG HOLDER (PIN) ×2 IMPLANT
SET ARTHROSCOPY TUBING (MISCELLANEOUS) ×2
SET ARTHROSCOPY TUBING LN (MISCELLANEOUS) ×1 IMPLANT
SUT ETHILON 3 0 PS 1 (SUTURE) ×2 IMPLANT
TOWEL OR 17X26 10 PK STRL BLUE (TOWEL DISPOSABLE) ×2 IMPLANT
WAND 90 DEG TURBOVAC W/CORD (SURGICAL WAND) IMPLANT
WRAP KNEE MAXI GEL POST OP (GAUZE/BANDAGES/DRESSINGS) ×2 IMPLANT

## 2015-02-08 NOTE — Anesthesia Postprocedure Evaluation (Signed)
  Anesthesia Post-op Note  Patient: Jacqueline Atkinson  Procedure(s) Performed: Procedure(s) (LRB): LEFT KNEE ARTHROSCOPY  (Left)  Patient Location: PACU  Anesthesia Type: MAC and regional  Level of Consciousness: awake and alert   Airway and Oxygen Therapy: Patient Spontanous Breathing  Post-op Pain: mild  Post-op Assessment: Post-op Vital signs reviewed, Patient's Cardiovascular Status Stable, Respiratory Function Stable, Patent Airway and No signs of Nausea or vomiting  Last Vitals:  Filed Vitals:   02/08/15 1545  BP: 148/80  Pulse:   Temp:   Resp:     Post-op Vital Signs: stable   Complications: No apparent anesthesia complications. No complaints.

## 2015-02-08 NOTE — Anesthesia Procedure Notes (Signed)
Anesthesia Regional Block:  Knee block  Pre-Anesthetic Checklist: ,, timeout performed, Correct Patient, Correct Site, Correct Laterality, Correct Procedure, Correct Position, site marked, Risks and benefits discussed, Surgical consent,  Pre-op evaluation,  At surgeon's request  Laterality: Left and Lower  Prep: chloraprep       Needles:  Injection technique: Single-shot      Needle Gauge: 18 and 18 G    Additional Needles: Knee block Narrative:  Anesthesiologist: Franne Grip  Additional Notes: Left knee block: IV sedation, CHG prep; meaningful verbal contact maintained throughout block placement. Marcaine placed into left knee joint. Patient tolerated well.

## 2015-02-08 NOTE — Transfer of Care (Signed)
Immediate Anesthesia Transfer of Care Note  Patient: Jacqueline Atkinson  Procedure(s) Performed: Procedure(s): LEFT KNEE ARTHROSCOPY  (Left)  Patient Location: PACU  Anesthesia Type:Regional and MAC  Level of Consciousness:  sedated, patient cooperative and responds to stimulation  Airway & Oxygen Therapy:Patient Spontanous Breathing and Patient connected to face mask oxgen  Post-op Assessment:  Report given to PACU RN and Post -op Vital signs reviewed and stable  Post vital signs:  Reviewed and stable  Last Vitals:  Filed Vitals:   02/08/15 1404  BP:   Pulse:   Temp:   Resp: 16    Complications: No apparent anesthesia complications

## 2015-02-08 NOTE — Progress Notes (Signed)
Assisted Dr. Delma Post with Left intra articular knee block. Side rails up, monitors on throughout procedure. See vital signs in flow sheet. Tolerated Procedure well.

## 2015-02-08 NOTE — Brief Op Note (Signed)
02/08/2015  3:05 PM  PATIENT:  Jacqueline Atkinson  59 y.o. female  PRE-OPERATIVE DIAGNOSIS:  LEFT KNEE MEDIAL AND LATERAL MENISCUS TEAR ,Complex and PrimaryOsteoarthritis  POST-OPERATIVE DIAGNOSIS:  LEFT KNEE MEDIAL AND LATERAL MENISCUS TEAR,Complex, and Primary Osteoarthritis.   PROCEDURE:  Procedure(s): LEFT KNEE ARTHROSCOPY  (Left) Medial and Lateral Meniscectomies and Abraison Chondroplasties of Patella and Lateral Femoral Condyle and Synovectomy of Suprapatellar Pouch  SURGEON:  Surgeon(s) and Role:    * Latanya Maudlin, MD - Primary   ASSISTANT: OR Tech   ANESTHESIA:   general  EBL:     BLOOD ADMINISTERED:none  DRAINS: none   LOCAL MEDICATIONS USED:  MARCAINE 30cc of 0.25% with Epinephrine at the end of the case,Intraarticular. At the beginning of the case I injected the three portals wih 20cc of Plain 0.25% Marcaine     SPECIMEN:  No Specimen  DISPOSITION OF SPECIMEN:  N/A  COUNTS:  YES  TOURNIQUET:  * No tourniquets in log *  DICTATION: .Other Dictation: Dictation Number [id number]  PLAN OF CARE: Discharge to home after PACU  PATIENT DISPOSITION:  PACU - hemodynamically stable.   Delay start of Pharmacological VTE agent (>24hrs) due to surgical blood loss or risk of bleeding: yes

## 2015-02-08 NOTE — Anesthesia Preprocedure Evaluation (Addendum)
Anesthesia Evaluation  Patient identified by MRN, date of birth, ID band Patient awake    Reviewed: Allergy & Precautions, NPO status , Patient's Chart, lab work & pertinent test results  Airway Mallampati: II  TM Distance: >3 FB Neck ROM: Full    Dental no notable dental hx.    Pulmonary former smoker,  breath sounds clear to auscultation  Pulmonary exam normal       Cardiovascular Exercise Tolerance: Good hypertension, Pt. 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 Reviewed office note from recent neurology visit.    Uses cane and walker    GI/Hepatic Neg liver ROS, GERD-  ,  Endo/Other  negative endocrine ROS  Renal/GU negative Renal ROS  negative genitourinary   Musculoskeletal  (+) Arthritis -,   Abdominal   Peds negative pediatric ROS (+)  Hematology negative hematology ROS (+)   Anesthesia Other Findings   Reproductive/Obstetrics negative OB ROS                          Anesthesia Physical Anesthesia Plan  ASA: II  Anesthesia Plan: MAC   Post-op Pain Management:    Induction: Intravenous  Airway Management Planned:   Additional Equipment:   Intra-op Plan:   Post-operative Plan:   Informed Consent: I have reviewed the patients History and Physical, chart, labs and discussed the procedure including the risks, benefits and alternatives for the proposed anesthesia with the patient or authorized representative who has indicated his/her understanding and acceptance.   Dental advisory given  Plan Discussed with: CRNA  Anesthesia Plan Comments: (Discussed spinal and general and MAC / knee block. I believe knee injection and MAC to be best option for her, as spinal could cause urine retention. She states she did fine with anesthesia for procedure on foot in 2011 with Dr. Su Hoff. That record is  unavailable.)      Anesthesia Quick Evaluation

## 2015-02-08 NOTE — Interval H&P Note (Signed)
History and Physical Interval Note:  02/08/2015 1:32 PM  Jacqueline Atkinson  has presented today for surgery, with the diagnosis of LEFT KNEE MEDIAL AND LATERAL MENISCUS TEAR   The various methods of treatment have been discussed with the patient and family. After consideration of risks, benefits and other options for treatment, the patient has consented to  Procedure(s): LEFT KNEE ARTHROSCOPY  (Left) as a surgical intervention .  The patient's history has been reviewed, patient examined, no change in status, stable for surgery.  I have reviewed the patient's chart and labs.  Questions were answered to the patient's satisfaction.     [name]

## 2015-02-09 ENCOUNTER — Encounter (HOSPITAL_COMMUNITY): Payer: Self-pay | Admitting: Orthopedic Surgery

## 2015-02-09 NOTE — Op Note (Signed)
Jacqueline Atkinson, OSLAND                ACCOUNT NO.:  [id number]  MEDICAL RECORD NO.:  [id number]  LOCATION:  [facility]                         FACILITY:  [facility]  PHYSICIAN:  Kipp Brood. Shakeira Rhee, M.D.DATE OF BIRTH:  Jul 01, 1956  DATE OF PROCEDURE:  02/08/2015 DATE OF DISCHARGE:  02/08/2015                              OPERATIVE REPORT   SURGEON:  Jori Moll A. Koki Buxton, M.D.  ASSISTANT:  OR tech.  LITTLE PREOPERATIVE INFORMATION: 1. She had a complete complex tear of the medial meniscus, left knee. 2. Complete complex tear of the lateral meniscus, left knee. 3. Severe primary osteoarthritis of the left knee.  Note, she had some     problems to have a general anesthesia that involves her dopamine     level, so she prefers no general anesthesia.  She said it is not     that it done safe, but it makes her postop course draw out much     longer.".  OPERATIONS: 1. Diagnostic arthroscopy, left knee. 2. Medial meniscectomy of a complex tear, medial meniscus, left knee. 3. Lateral meniscectomy of a complex tear of the lateral meniscus,     left knee. 4. Abrasion chondroplasty, lateral femoral condyle. 5. Abrasion chondroplasty of patella. 6. Synovectomy, suprapatellar pouch, all left knee.  DESCRIPTION OF PROCEDURE:  Under local anesthesia block with little supplemented anesthesia, we did a sterile prepping and draping of the left lower extremity with the left leg was in a knee holder. Appropriate time-out was carried out.  I also marked the appropriate left leg in the holding area.  I first of all did a local block to the 3 portals.  I utilized about 20 mL of 0.25% plain Marcaine portal blocks. The knee block was done by Anesthesia prior to surgery which is intraarticular.  At this time, a small punctate incision was made in suprapatellar pouch.  Inflow cannula was inserted.  Knee was distended with saline.  The patient had 2 g of IV Ancef.  At this time, another small punctate incision was made in  the lateral joint.  The arthroscope was entered from lateral approach and a complete diagnostic arthroscopy was carried out.  Note, she had a severe complex tear of the medial meniscus when I went into the medial portal.  I utilized the ArthroCare and did a medial meniscectomy.  I then went over and crossed over the cruciates, were somewhat torn, but intact.  I went over the lateral joint.  She had a severe complex complete tear of the lateral meniscus. I did a lateral meniscectomy.  She had complete erosion of the femoral condyle, cartilaginous surface.  I did an abrasion chondroplasty down the bleeding bone.  I then went up in the suprapatellar pouch.  She had severe synovitis.  I first did a synovectomy.  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  I did a partial abrasion chondroplasty of the patella down the bleeding bone.  I thoroughly irrigated out the knee, removed all the fluid, injected 30 mL of 0.25% Marcaine with epinephrine into the knee joint.  Note, I did check with anesthesia to make sure I did not over use the amount of Marcaine.  Sterile  Neosporin dressings were applied after I closed the wound.  A bundle dressing was applied.  She left the operating room in satisfactory condition.          ______________________________ Kipp Brood Gladstone Lighter, M.D.     RAG/MEDQ  D:  02/08/2015  T:  02/09/2015  Job:  [id number]

## 2015-02-14 ENCOUNTER — Telehealth: Payer: Self-pay | Admitting: Neurology

## 2015-02-14 NOTE — Telephone Encounter (Signed)
Called to check on GI referral. Patient has appt with [facility] on 02/27/2015.

## 2015-02-20 ENCOUNTER — Telehealth: Payer: Self-pay

## 2015-02-20 NOTE — Telephone Encounter (Signed)
Patient left voicemail today at 12:35 requesting records sent to her insurance provider. Will need to sign a release. Please call back at [phone].

## 2015-02-23 NOTE — Telephone Encounter (Signed)
Called patient and let her know that she would need to either come in or fax over a release so that we could process sending her records to her insurance company.

## 2015-02-27 ENCOUNTER — Other Ambulatory Visit: Payer: Self-pay | Admitting: Gastroenterology

## 2015-02-27 DIAGNOSIS — R1310 Dysphagia, unspecified: Secondary | ICD-10-CM

## 2015-02-27 NOTE — Telephone Encounter (Signed)
ROI form received and records mailed as requested on form to [place].

## 2015-03-07 ENCOUNTER — Other Ambulatory Visit: Payer: Self-pay | Admitting: Physician Assistant

## 2015-03-07 ENCOUNTER — Ambulatory Visit
Admission: RE | Admit: 2015-03-07 | Discharge: 2015-03-07 | Disposition: A | Discharge status: Home / Self Care | Payer: BLUE CROSS/BLUE SHIELD | Source: Ambulatory Visit | Attending: Gastroenterology | Admitting: Gastroenterology

## 2015-03-07 DIAGNOSIS — R1310 Dysphagia, unspecified: Secondary | ICD-10-CM

## 2015-03-07 NOTE — Telephone Encounter (Signed)
Expand All Collapse All     Subjective:    Patient ID: Jacqueline Atkinson, female DOB: [birth year], 59 y.o. MRN: [id number] This chart was scribed for [name], MD by [name], Medical Scribe. This patient was seen in Room 28 and the patient's care was started at 9:54 AM.   Chief Complaint  Patient presents with  . ankles swelling    HPI HPI Comments: Jacqueline Atkinson is a 59 y.o. female with a hx of multiple system atrophy C, paresthesia, gait difficulty, back pain, knee pain, and HTN who presents to the [facility] for a follow-up for ankle swelling.  She fell onto her knees and is waiting for knee surgery, but is waiting for her insurance to process. Neurologist thought it was best not to put her under complete sedation and advised trial of epidural only and not using Zofran or Phenergan. She was on amlodipine previously, and was changed to diltiazem due to esophageal spasms; she was to consider swallow study or GI follow-up if symptoms worsened. She was previously on low dose of HCTZ, but stopped 2 years ago as it was causing HA and not effectively controlling her blood pressure. She was also on chlorthalidone previously, this was discontinued because it was exacerbating dehydrating and vertigo as well as cramping of her calf muscles.  Ankle Swelling: The ankle swelling improves overnight, but does not resolve completely. Patient notes she does drink plenty of water throughout the day; she only drinks water except when she goes out to eat, when she will order iced tea. She does wear compression socks occasionally, but not often because they are difficult to put on. Patient notes getting some swelling above the sock when she wears them, and notes that it is painful where the sock digs in. Patient denies swelling anywhere else in her body.  Hypertension: She has been getting blood pressure readings around 133/70 at home. She does have some balance issues while  ambulating due to multiple system atrophy, but no dizziness or falls after standing up. She does eat a lot of salt and has not been reducing her salt. She states her taste sensation has decreased so she has to add salt in order to taste her food.   Incontinence: Patient has been having some occasional problems with incontinence attributed to the diuretic she is on. She notes that sometimes she does not get to the bathroom fast enough, but does not make full bowel movements when she has these episodes. She has been making about 1 regular bowel movement a day with soft stools, which is normal for her. Patient denies constipation and diarrhea.  Cough: Patient does report having a productive cough. She believes this to be allergy-related.  Shin Pain: Patient notes that she has recently been waking up with cramping shin pains that wake her up from sleep. She will have these pains about 2-3 times a night. She has been taking flexeril, once a night, for this but she is trying to wean off of it to see how she does. She has had this problem before. Patient notes that she cannot extend her foot.  Knee Pain: Patient is scheduled to have knee surgery with Dr. Gladstone Lighter on 4/13. She has been taking Indocin 3 times a day.  Back Pain: She has been using a tens unit with relief to her back pain.   Vitamins/Supplements: Patient takes vitamin D, red yeast rice, and fish oil.  Diet: She states she has been eating a balanced  diet, eating a mediterranean diet at home. She does add a lot of salt to her food because of decreased taste sensation.  Patient is going to an ataxia conference in [place], [place] next week. Her sister will drive.  Past Medical History  Diagnosis Date  . Allergy   . Arthritis   . Hypertension   . Anxiety   . Hyperlipidemia    Current Outpatient Prescriptions on File Prior to Visit  Medication Sig Dispense Refill  . acetaminophen (TYLENOL) 500 MG tablet Take 1,000 mg  by mouth 2 (two) times daily as needed for pain.    . carbidopa-levodopa (SINEMET IR) 25-100 MG per tablet Take 2 tablets by mouth 3 (three) times daily. 1-2 tablets    . Cholecalciferol (VITAMIN D3) 5000 UNITS CAPS Take 1,000 Units by mouth daily.     . indomethacin (INDOCIN) 25 MG capsule Take 50 mg by mouth 2 (two) times daily with a meal.     . lidocaine (LIDODERM) 5 % Place 1 patch onto the skin daily. To center of thoracic - greatest area of pain. Remove & Discard patch within 12 hours 30 patch 5  . Red Yeast Rice Extract (RED YEAST RICE PO) Take by mouth daily.     No current facility-administered medications on file prior to visit.   Allergies  Allergen Reactions  . Sulfa Antibiotics     Long time ago and does not remember reaction    Review of Systems  Constitutional: Positive for activity change and fatigue. Negative for fever, chills, appetite change and unexpected weight change.  HENT: Positive for congestion, postnasal drip and rhinorrhea. Negative for sinus pressure and voice change.  Respiratory: Positive for cough.  Cardiovascular: Positive for leg swelling.  Gastrointestinal: Negative for diarrhea and constipation.  Musculoskeletal: Positive for myalgias, back pain, joint swelling, arthralgias and gait problem.  Skin: Negative for rash.  Allergic/Immunologic: Negative for immunocompromised state.  Neurological: Positive for weakness and light-headedness. Negative for dizziness, syncope, facial asymmetry and speech difficulty.  Hematological: Negative for adenopathy.  Psychiatric/Behavioral: Negative for dysphoric mood. The patient is not nervous/anxious.       Objective:  BP 113/75 mmHg  Pulse 97  Temp(Src) 98.2 F (36.8 C) (Oral)  Resp 16  Ht 5\' 8"  (1.727 m)  Wt 219 lb (99.338 kg)  BMI 33.31 kg/m2  SpO2 98% Physical Exam  Constitutional: She is oriented to person, place, and time. She appears well-developed and  well-nourished. No distress.  HENT:  Head: Normocephalic and atraumatic.  Mouth/Throat: Oropharynx is clear and moist. No oropharyngeal exudate.  Eyes: Pupils are equal, round, and reactive to light.  Neck: Neck supple.  Cardiovascular: Normal rate and regular rhythm.  Murmur heard. 3/6 ejection murmur, left upper sternal border.  Pulmonary/Chest: Effort normal and breath sounds normal. No respiratory distress. She has no wheezes. She has no rales.  CTAB.  Musculoskeletal: She exhibits edema.  3+ lower extremity edema to knee with moderate pitting. No sacral edema  Neurological: She is alert and oriented to person, place, and time. No cranial nerve deficit.  Slowed, ataxic gait. Took 20 seconds to get up from chair without using arms.  Skin: Skin is warm and dry. No rash noted.  Psychiatric: She has a normal mood and affect. Her behavior is normal.  Vitals reviewed.         Assessment & Plan:   Essential hypertension, benign - increase losartan from 50 to 100 since will need to d/c diltiazem as likely exacerbating edema  Undiagnosed cardiac murmurs  Myalgia and myositis  Peripheral edema - suspect due to diltiazem so stop - not a good candidate for diuretic - has tried compression socks w/o sig benefit but will retry as edema starts to go down - will hopefully be easier to apply and less painful if edema mild rather than severe        Can we refill? furosemide (LASIX) 20 MG tablet [[id number], pharmacy request.

## 2015-03-30 ENCOUNTER — Telehealth: Payer: Self-pay | Admitting: *Deleted

## 2015-03-30 NOTE — Telephone Encounter (Signed)
Patient would like to speak with Dr. Carles Collet about writing a letter of recommendation for a study. Call back number [phone]

## 2015-03-31 ENCOUNTER — Encounter: Payer: Self-pay | Admitting: *Deleted

## 2015-03-31 NOTE — Telephone Encounter (Signed)
Left message on machine for patient to call back. To see what kind of study she needs a letter for and get more details. Awaiting call back.

## 2015-04-03 ENCOUNTER — Other Ambulatory Visit: Payer: Self-pay | Admitting: Neurology

## 2015-04-03 ENCOUNTER — Other Ambulatory Visit: Payer: Self-pay | Admitting: Physician Assistant

## 2015-04-06 ENCOUNTER — Other Ambulatory Visit: Payer: Self-pay | Admitting: Neurology

## 2015-04-06 ENCOUNTER — Other Ambulatory Visit: Payer: Self-pay | Admitting: Family Medicine

## 2015-04-06 NOTE — Telephone Encounter (Signed)
Lmovm to return my call. Need to speak with patient about medication.

## 2015-04-10 MED ORDER — CARBIDOPA-LEVODOPA 25-100 MG PO TABS: ORAL_TABLET | ORAL | Status: DC

## 2015-04-10 NOTE — Telephone Encounter (Signed)
Pt needs a refill on the carbidopa levedopia called in to [facility] on [address] pt phone number is [phone]

## 2015-04-10 NOTE — Telephone Encounter (Signed)
Called patient again & I was able to speak with her. Explained that we received a refill request for her carbidopa-levodopa & we needed clarification on how she takes it because what was listed as her directions in her chart was different from what we received from her pharmacy. She states she takes 1 tablet 4 times a day. Will send a refill for that amount.

## 2015-04-18 ENCOUNTER — Other Ambulatory Visit: Payer: Self-pay | Admitting: Gastroenterology

## 2015-04-21 ENCOUNTER — Ambulatory Visit (INDEPENDENT_AMBULATORY_CARE_PROVIDER_SITE_OTHER): Payer: BLUE CROSS/BLUE SHIELD | Admitting: Family Medicine

## 2015-04-21 ENCOUNTER — Encounter: Payer: Self-pay | Admitting: Family Medicine

## 2015-04-21 VITALS — BP 140/84 | HR 85 | Temp 98.2°F | Resp 16 | Ht 66.5 in | Wt 208.4 lb

## 2015-04-21 DIAGNOSIS — M1712 Unilateral primary osteoarthritis, left knee: Secondary | ICD-10-CM | POA: Diagnosis not present

## 2015-04-21 DIAGNOSIS — K2210 Ulcer of esophagus without bleeding: Secondary | ICD-10-CM

## 2015-04-21 DIAGNOSIS — E538 Deficiency of other specified B group vitamins: Secondary | ICD-10-CM

## 2015-04-21 DIAGNOSIS — K2100 Gastro-esophageal reflux disease with esophagitis, without bleeding: Secondary | ICD-10-CM

## 2015-04-21 DIAGNOSIS — G238 Other specified degenerative diseases of basal ganglia: Secondary | ICD-10-CM | POA: Diagnosis not present

## 2015-04-21 DIAGNOSIS — I10 Essential (primary) hypertension: Secondary | ICD-10-CM | POA: Diagnosis not present

## 2015-04-21 DIAGNOSIS — M25562 Pain in left knee: Secondary | ICD-10-CM

## 2015-04-21 DIAGNOSIS — L820 Inflamed seborrheic keratosis: Secondary | ICD-10-CM

## 2015-04-21 DIAGNOSIS — R269 Unspecified abnormalities of gait and mobility: Secondary | ICD-10-CM

## 2015-04-21 DIAGNOSIS — Z79899 Other long term (current) drug therapy: Secondary | ICD-10-CM

## 2015-04-21 DIAGNOSIS — D649 Anemia, unspecified: Secondary | ICD-10-CM

## 2015-04-21 DIAGNOSIS — R252 Cramp and spasm: Secondary | ICD-10-CM | POA: Diagnosis not present

## 2015-04-21 DIAGNOSIS — L570 Actinic keratosis: Secondary | ICD-10-CM

## 2015-04-21 LAB — CBC WITH DIFFERENTIAL/PLATELET
Basophils Absolute: 0.1 10*3/uL (ref 0.0–0.1)
Basophils Relative: 1 % (ref 0–1)
Eosinophils Absolute: 0.2 10*3/uL (ref 0.0–0.7)
Eosinophils Relative: 3 % (ref 0–5)
HEMATOCRIT: 31.1 % — AB (ref 36.0–46.0)
Hemoglobin: 9.8 g/dL — ABNORMAL LOW (ref 12.0–15.0)
Lymphocytes Relative: 22 % (ref 12–46)
Lymphs Abs: 1.5 10*3/uL (ref 0.7–4.0)
MCH: 23.7 pg — AB (ref 26.0–34.0)
MCHC: 31.5 g/dL (ref 30.0–36.0)
MCV: 75.1 fL — ABNORMAL LOW (ref 78.0–100.0)
MPV: 10 fL (ref 8.6–12.4)
Monocytes Absolute: 0.4 10*3/uL (ref 0.1–1.0)
Monocytes Relative: 6 % (ref 3–12)
NEUTROS PCT: 68 % (ref 43–77)
Neutro Abs: 4.5 10*3/uL (ref 1.7–7.7)
Platelets: 310 10*3/uL (ref 150–400)
RBC: 4.14 MIL/uL (ref 3.87–5.11)
RDW: 16.3 % — ABNORMAL HIGH (ref 11.5–15.5)
WBC: 6.6 10*3/uL (ref 4.0–10.5)

## 2015-04-21 LAB — VITAMIN B12: VITAMIN B 12: 336 pg/mL (ref 211–911)

## 2015-04-21 LAB — COMPREHENSIVE METABOLIC PANEL
ALK PHOS: 137 U/L — AB (ref 39–117)
ALT: <8 U/L (ref 0–35)
AST: 12 U/L (ref 0–37)
Albumin: 3.9 g/dL (ref 3.5–5.2)
BILIRUBIN TOTAL: 0.3 mg/dL (ref 0.2–1.2)
BUN: 10 mg/dL (ref 6–23)
CO2: 28 mEq/L (ref 19–32)
Calcium: 9.2 mg/dL (ref 8.4–10.5)
Chloride: 104 mEq/L (ref 96–112)
Creat: 0.63 mg/dL (ref 0.50–1.10)
Glucose, Bld: 88 mg/dL (ref 70–99)
Potassium: 4.3 mEq/L (ref 3.5–5.3)
SODIUM: 142 meq/L (ref 135–145)
Total Protein: 7 g/dL (ref 6.0–8.3)

## 2015-04-21 LAB — LIPID PANEL
CHOLESTEROL: 210 mg/dL — AB (ref 0–200)
HDL: 40 mg/dL — ABNORMAL LOW (ref >=46)
LDL Cholesterol: 139 mg/dL — ABNORMAL HIGH (ref 0–99)
Total CHOL/HDL Ratio: 5.3 Ratio
Triglycerides: 155 mg/dL — ABNORMAL HIGH (ref <150)
VLDL: 31 mg/dL (ref 0–40)

## 2015-04-21 MED ORDER — CLONAZEPAM 0.5 MG PO TABS: 0.5000 mg | ORAL_TABLET | Freq: Every day | ORAL | Status: DC

## 2015-04-21 MED ORDER — POTASSIUM CHLORIDE CRYS ER 20 MEQ PO TBCR: 20.0000 meq | EXTENDED_RELEASE_TABLET | Freq: Every day | ORAL | Status: DC

## 2015-04-21 MED ORDER — DICLOFENAC SODIUM 1 % TD GEL: 4.0000 g | Freq: Two times a day (BID) | TRANSDERMAL | Status: DC

## 2015-04-21 MED ORDER — FUROSEMIDE 40 MG PO TABS: 40.0000 mg | ORAL_TABLET | Freq: Every day | ORAL | Status: DC

## 2015-04-21 NOTE — Progress Notes (Addendum)
Subjective:    Patient ID: Jacqueline Atkinson, female    DOB: 1956/02/06, 59 y.o.   MRN: [id number] This chart was scribed for [name], MD by [name], Medical Scribe. This patient was seen in Room 24 and the patient's care was started at 10:16 AM.   Chief Complaint  Patient presents with  . Follow-up  . Bloodwork    HPI HPI Comments: Dangela How is a 59 y.o. female with a hx of multiple system atrophy C, hypertension and GERD who presents to the [facility] for a follow-up. Patient is fasting today; she has not eaten since last night.  Last seen 3 months ago. Has a recent diagnosis of multiple system atrophy C for which she has been having progressive weakness, difficulty ambulating and dysphagia. Followed by Dr. Carles Collet from neurology. I've mainly been following patient for her blood pressure. At our last visit, she had been having significant peripheral edema which we had attributed to her diltiazem as well as some orthostatic symptoms. Therefore, I had patient stop her diltiazem and increased her losartan from 50 mg to 100 mg. Recommended compression socks. Patient called several days after visit and reported after stopping diltiazem, her systolic blood pressure had increased to [id number], diastolic 64-158. She did not tolerate HCTZ or chlorthalidone previously, so started patient on Lasix. Push fluids and high K diet. Was worried about her urinary incontinence so advised patient could restart 0.5-1 tablet of her diltiazem for blood pressure control since her edema did not resolve after the medication was stopped.   Patient is incontinent of bowel and bladder.  Using Klonopin for sleep. Will need refill.   Patient underwent knee arthroscopy 2 months ago. She had been using a fair amount of indocin which was causing nausea and worsening her dysphagia. She was referred to Dr. Cristina Gong for an EGD.  Left Knee Arthroscopy: Patient states she has been doing well since the  arthroscopy. She still has some knee pain. She was told to stay off of NSAIDs after her EGD 3 days ago. She has not tried Voltaren.  Endoscopy: Patient saw Dr. Winnifred Friar at [place] for the endoscopy 3 days ago. She was found to have esophageal inflammation and was instructed to avoid NSAIDs. She was put on Nexium and was told to follow up in about 6 weeks.   Hypertension: Blood pressure log over the last 2 months shows at lowest 138/80s but typically 140s-180s/90s-100s. They tend to be the highest in the mornings or when moving around (she believes it is likely due to the pain). FMHx includes renal failure; her mother died from renal failure. She attributes her mother's kidney failure to lack of communication among her doctors.  Leg Swelling: Patient notes the leg swelling has improved some. She denies changes in urinary frequency with the Lasix. She states she drinks a lot of water and urinates often at baseline. Patient does note having some leg cramps since starting the Lasix.  Bump on Right Hand: Patient reports having a bump on her right hand. She states she normally has dry skin. She did go to a dermatologist previously who had frozen off a few areas.  Past Medical History  Diagnosis Date  . Allergy   . Hypertension   . Anxiety   . Hyperlipidemia   . Arthritis     Left knee  . Multiple system atrophy C    Past Surgical History  Procedure Laterality Date  . Foot surgery  2011  3rd metatarsal LT foot  . Tonsillectomy and adenoidectomy    . Knee arthroscopy Left 02/08/2015    Procedure: LEFT KNEE ARTHROSCOPY ;  Surgeon: Latanya Maudlin, MD;  Location: [facility];  Service: Orthopedics;  Laterality: Left;   Current Outpatient Prescriptions on File Prior to Visit  Medication Sig Dispense Refill  . acetaminophen (TYLENOL) 500 MG tablet Take 1,000 mg by mouth 2 (two) times daily as needed for pain.    . carbidopa-levodopa (SINEMET IR) 25-100 MG per tablet Take 1 tablet 4 times a day 120 tablet 4    . cholecalciferol (VITAMIN D) 1000 UNITS tablet Take 1,000 Units by mouth daily.    . cyclobenzaprine (FLEXERIL) 10 MG tablet Take 1 tablet (10 mg total) by mouth 3 (three) times daily as needed for muscle spasms. 30 tablet 6  . losartan (COZAAR) 100 MG tablet Take 1 tablet (100 mg total) by mouth daily. 90 tablet 3  . Omega-3 Fatty Acids (OMEGA 3 PO) Take 1 capsule by mouth 2 (two) times daily.    . Red Yeast Rice 600 MG TABS Take 1 tablet by mouth 2 (two) times daily.    Marland Kitchen lidocaine (LIDODERM) 5 % Place 1 patch onto the skin daily. Remove & Discard patch within 12 hours or as directed by MD    . traMADol (ULTRAM) 50 MG tablet Take 50 mg by mouth every 6 (six) hours as needed for moderate pain.     No current facility-administered medications on file prior to visit.   Allergies  Allergen Reactions  . Sulfa Antibiotics     Long time ago and does not remember reaction   Family History  Problem Relation Age of Onset  . Cancer Mother   . Diabetes Mother   . Kidney disease Mother   . COPD Father   . Brain cancer Brother    History   Social History  . Marital Status: Divorced    Spouse Name: N/A  . Number of Children: 7  . Years of Education: 12   Occupational History  . ACTIVITY COORDINATOR    Social History Main Topics  . Smoking status: Former Smoker    Quit date: 03/14/1988  . Smokeless tobacco: Never Used  . Alcohol Use: No  . Drug Use: No  . Sexual Activity: Not on file   Other Topics Concern  . None   Social History Narrative   Depression screen [facility] 2/9 04/21/2015 01/20/2015  Decreased Interest 0 0  Down, Depressed, Hopeless 0 0  PHQ - 2 Score 0 0      Review of Systems  Constitutional: Positive for activity change and fatigue. Negative for appetite change.  Respiratory: Negative for chest tightness, shortness of breath and wheezing.   Cardiovascular: Positive for leg swelling. Negative for chest pain and palpitations.  Genitourinary: Positive for  enuresis. Negative for frequency.  Musculoskeletal: Positive for myalgias, joint swelling, arthralgias and gait problem.  Skin: Negative for rash.  Neurological: Positive for weakness and numbness.  Psychiatric/Behavioral: Positive for sleep disturbance. Negative for dysphoric mood. The patient is nervous/anxious.        Objective:  BP 140/84 mmHg  Pulse 85  Temp(Src) 98.2 F (36.8 C) (Oral)  Resp 16  Ht 5' 6.5" (1.689 m)  Wt 208 lb 6.4 oz (94.53 kg)  BMI 33.14 kg/m2  SpO2 98%  Physical Exam  Constitutional: She is oriented to person, place, and time. She appears well-developed and well-nourished. No distress.  HENT:  Head: Normocephalic and atraumatic.  Mouth/Throat: Oropharynx is clear and moist. No oropharyngeal exudate.  Eyes: Pupils are equal, round, and reactive to light.  Neck: Neck supple.  Cardiovascular: Normal rate and S2 normal.   Murmur heard.  Systolic murmur is present with a grade of 3/6  3/6 systolic ejection murmur, left upper sternal border. Absent S1.  Pulmonary/Chest: Effort normal and breath sounds normal. No respiratory distress. She has no wheezes. She has no rales.  Clear to auscultation bilaterally.   Musculoskeletal: She exhibits edema.  2+ pitting edema bilaterally to upper calves. No sacral edema.  Neurological: She is alert and oriented to person, place, and time. No cranial nerve deficit.  Skin: Skin is warm and dry. No rash noted.  Psychiatric: She has a normal mood and affect. Her behavior is normal.  Vitals reviewed.       Assessment & Plan:   1. Multiple system atrophy C   2. Polypharmacy   3. Left knee pain - try top nsaid - avoid oral due to dysphagia and comorbidities  4. Gait difficulty - progressively worsening due to multiple system atrophy 4  5. Essential hypertension   6. Gastroesophageal reflux disease with esophagitis - seeing GI, has appt for EGD  7. Esophageal ulcer   8. Anemia, unspecified anemia type   9. Vitamin B12  deficiency   10. Muscle cramp, nocturnal - Instructed to refill cyclobenzaprine as needed and continue qhs scheduled dose - ok to call for refill whenever needed  11. Primary osteoarthritis of left knee   12. Seborrheic keratoses, inflamed   13. Actinic keratosis - cryo to 3 lesions on right extensor surface forearm.  14.    Edema - cont lasix, add in K, ok to restart amlodipine if needed for improved bp control since edema did not improve w/ cessation of the ccb.  Orders Placed This Encounter  Procedures  . Comprehensive metabolic panel    Order Specific Question:  Has the patient fasted?    Answer:  Yes  . Lipid panel    Order Specific Question:  Has the patient fasted?    Answer:  Yes  . CBC with Differential/Platelet  . Vitamin B12    Meds ordered this encounter  Medications  . esomeprazole (NEXIUM) 40 MG capsule    Sig: Take 40 mg by mouth daily at 12 noon.  . clonazePAM (KLONOPIN) 0.5 MG tablet    Sig: Take 1 tablet (0.5 mg total) by mouth at bedtime.    Dispense:  90 tablet    Refill:  1  . furosemide (LASIX) 40 MG tablet    Sig: Take 1 tablet (40 mg total) by mouth daily.    Dispense:  90 tablet    Refill:  1  . potassium chloride SA (K-DUR,KLOR-CON) 20 MEQ tablet    Sig: Take 1 tablet (20 mEq total) by mouth daily. With fureosemide    Dispense:  90 tablet    Refill:  1  . diclofenac sodium (VOLTAREN) 1 % GEL    Sig: Apply 4 g topically 2 (two) times daily. To knees for severe arthritis    Dispense:  100 g    Refill:  11    I personally performed the services described in this documentation, which was scribed in my presence. The recorded information has been reviewed and considered, and addended by me as needed.  [name], MD MPH

## 2015-04-24 ENCOUNTER — Telehealth: Payer: Self-pay

## 2015-04-24 NOTE — Telephone Encounter (Signed)
PA completed for diclofenac gel on covermymeds. Pending.  PA approved through 10/27/2038. Notified pharm.

## 2015-04-29 ENCOUNTER — Encounter: Payer: Self-pay | Admitting: Family Medicine

## 2015-04-30 ENCOUNTER — Other Ambulatory Visit: Payer: Self-pay | Admitting: Family Medicine

## 2015-05-26 ENCOUNTER — Encounter: Payer: Self-pay | Admitting: Neurology

## 2015-06-12 ENCOUNTER — Ambulatory Visit: Payer: BLUE CROSS/BLUE SHIELD | Admitting: Neurology

## 2015-06-13 ENCOUNTER — Ambulatory Visit (INDEPENDENT_AMBULATORY_CARE_PROVIDER_SITE_OTHER): Payer: BLUE CROSS/BLUE SHIELD | Admitting: Neurology

## 2015-06-13 ENCOUNTER — Encounter: Payer: Self-pay | Admitting: Neurology

## 2015-06-13 VITALS — BP 122/74 | HR 96 | Ht 67.0 in | Wt 209.0 lb

## 2015-06-13 DIAGNOSIS — M1712 Unilateral primary osteoarthritis, left knee: Secondary | ICD-10-CM | POA: Diagnosis not present

## 2015-06-13 DIAGNOSIS — F458 Other somatoform disorders: Secondary | ICD-10-CM | POA: Diagnosis not present

## 2015-06-13 DIAGNOSIS — G238 Other specified degenerative diseases of basal ganglia: Secondary | ICD-10-CM | POA: Diagnosis not present

## 2015-06-13 DIAGNOSIS — R1319 Other dysphagia: Secondary | ICD-10-CM

## 2015-06-13 MED ORDER — CARBIDOPA-LEVODOPA ER 50-200 MG PO TBCR: 1.0000 | EXTENDED_RELEASE_TABLET | Freq: Every day | ORAL | Status: DC

## 2015-06-13 NOTE — Progress Notes (Signed)
Jacqueline Atkinson was seen today in the movement disorders clinic for neurologic consultation at the request of [name].   The patient presents today to the movement disorder clinic at [facility] neurology for consultation regarding possible Parkinson's disease.  I had the opportunity to review records from [name], her prior neurologist.  It appears that the patient has been seeing [name] since 12/29/2013.  Patient reports that she just has not been the same ever since she had a fall in 2011.  Her R foot got caught in a stool and she fell.  She was subsequently placed under the care of orthopedic surgeons and intermittently would go to have fluid drained from her knee.   She developed a R leg drag.  She, therefore, initially attributed difficulty in walking to this injury.  She states that later on, she developed balance issues (cannot say how long that took to develop).  Over the course of time, however, she noticed decreased ability to use the right hand in order to write and began to use the left hand for more tasks.  She stated that she would have to reach across her body to use the mouse (noted that as of Christmas, 2014).  In Feb, she was at work and had a bad HA and then had a CT and subsequent MRI of the brain as they saw a meningioma and so she was referred to [name].  She saw [name] on 12/29/2013.  About a week later on 01/06/2014 she followed up with [name] who had recommended an EMG and this was done and was normal.  On 01/17/2014 [name] started her on levodopa and referred her for another opinion to [facility].  She has not yet had that appointment, but it is scheduled for 06/15/2014.  Pt states that when she first got to the pharmacy, there was a RX there for both and IR and CR tablet (she now thinks that it was accident)  She only picked up the CR and took it bid and stated that it helped her feel "looser" and more steady.  When she f/u with [name], she realized that she was supposed to be on  the IR but when she tried that, she had a lot of nausea and even emesis (tried it with carbs even but initially started with 2 po tid; then went to 1 po 6 times per day and didn't have as much emesis but still had to have the carbs with it and she didn't like all the carb intake).  Therefore, she went back to the CR version on her own and she is only on one tablet at 8am/8pm. She presents today for another opinion.  06/24/14 update:  Pt presents for f/u.  DaT scan was done yesterday.  There was significant reduction in uptake of the radiotracer in the bilateral putamen with activity confined to the caudate.  Pt remains on carbidopa/levodopa 25/100 CR at 8am/8pm.  She has been having more knee pain and went to [place] [facility].  She was told that she needs an MRI but she decided that she didn't want to do an MRI if she wasn't going to have surgery and she didn't want to consider that.  She was given klonopin for insomnia since last visit.  She rarely takes it and said that she only took it a few times.  One of the reasons for insomnia is stiffness associated with the back pain.  Klonopin does help her sleep  when she takes it.  She had one fall since last visit; she was going up the steps and her knee buckled and fell.  She is not sure if her knee caused her to fall.  She is exercising; she is doing yoga twice per week.  She is now able to get off of the floor which she didn't used to be able to do.  She is getting ready to start the [facility] exercise class.   09/26/14 update:  Pt returns today for f/u.  I was trying to transition her slowly to the IR formulation of levodopa, which she has had trouble tolerating in the past.  Last visit, I asked her to try to take carbidopa/levodopa 25/100 CR at 8am and 8pm and carbidopa/levodopa 25/100 IR at noon.  She actually moved up the dosage on her own and she is taking carbidopa/levodopa 25/100, 2 at 8am, 2 at noon, 2 at 4pm, and 2 at 8pm.  She may miss one of those dosages and may  only get in 6 pills per day.  She gets really nauseated after 10 minutes but then it gets better if she can get throught that 10 minutes.   MBE was performed on 07/01/14 and it was normal and a regular, thin liquid diet was recommended.  She did got to UC after a fall on knee on 09/13/14. She needs a knee surgery but had to wait until insurance changes.   She is exercising; she is doing yoga twice per week.  C/o a pain in the mid thoracic region that wakes her up in the middle of the night.  Lidoderm has helped but it was very expensive.  Reviewed MRI report from [facility] in 12/2013 and it was normal.  Having more urinary incontinence.  Mood is really good; feels like she is at best place that she has been in virtually all her life.  02/06/15 update:  Pt is accompanied by her sister who supplements the history.  The patient has a history of multiple system atrophy, cerebellar type.  She is on carbidopa/levodopa 25/100.  She is supposed to be on 2 tablets by mouth 4 times per day that she seems to have nausea and emesis when she takes it that way, so she is only taking 1 tablet by mouth 4 times per day.  She is also on Indocin twice a day and is hoping to get off of that after her knee surgery.  She is having knee surgery on Wednesday, which she thinks is the primary etiology for some of her gait issues.  She has been awaiting this knee surgery for a long time.  Her orthopedic surgeon did tell her that he was not sure that it was going to be able to help all of her knee issues, including her gait, however.  No falls.  She went to an ataxia conference in [place]. She asks multiple questions re: genetics.  She is planning on applying to enroll in a MSA-C MRI study involving a 7 Tesla magnet.  She will need to travel to [place] for this.  She has had some urinary incontinence, occasional.  No diplopia.  Walks with cane most of the time.  She also complains that food is getting "stuck" in the midepigastric region when  she is hungry or when she is tired.  This can consist of posterior, meat, or a salad.  She will have to walk around and stretch and sometimes will cough the entire bolus up whole.  She had a modified barium swallow in September of last year that was unremarkable.  06/13/15 update:  The patient is seen back today in follow-up in regard to her multiple system atrophy.  She is on carbidopa/levodopa 25/100, 1 tablet 4 times per day (8am/12pm/4pm/8pm).  She has had trouble tolerating increased dosages because of nausea, but we were previously unsure whether the nausea was from the levodopa or if it was from the Indocin.  Last visit, she stated that she was hopeful to discontinue the Indocin once she had her knee surgery.  She did have this on April 13 under a local block.  I reviewed those records.  She also saw GI since last visit.  I got a note from her gastroenterologist.  She had EGD which revealed evidence of gastric esophageal reflux disease, esophageal stenosis and a medium sized hiatal hernia.  She was started on Nexium, 40 mg twice a day. She states that she has another EGD on sept 10 to hopefully stretch the esophagus. .  She states that they couldn't stretch the esophagus because it was so ulcerated so placed on nexium first.  She is still having trouble with swallowing but thinks that is the esophagus issues.  She is on a soft diet.  Today, the patient states that she is having more cramping at night and more troubles with dexterity in her fingers.  Still having nausea despite off indocin; thinks from levodopa.  Neuroimaging has previously been performed.  It is available for my review today.  PREVIOUS MEDICATIONS: Sinemet and Sinemet CR  ALLERGIES:   Allergies  Allergen Reactions  . Sulfa Antibiotics     Long time ago and does not remember reaction    CURRENT MEDICATIONS:  Current Outpatient Prescriptions on File Prior to Visit  Medication Sig Dispense Refill  . acetaminophen (TYLENOL) 500  MG tablet Take 1,000 mg by mouth 2 (two) times daily as needed for pain.    . carbidopa-levodopa (SINEMET IR) 25-100 MG per tablet Take 1 tablet 4 times a day 120 tablet 4  . cholecalciferol (VITAMIN D) 1000 UNITS tablet Take 1,000 Units by mouth daily.    . clonazePAM (KLONOPIN) 0.5 MG tablet Take 1 tablet (0.5 mg total) by mouth at bedtime. 90 tablet 1  . cyclobenzaprine (FLEXERIL) 10 MG tablet Take 1 tablet (10 mg total) by mouth 3 (three) times daily as needed for muscle spasms. 30 tablet 6  . diclofenac sodium (VOLTAREN) 1 % GEL Apply 4 g topically 2 (two) times daily. To knees for severe arthritis 100 g 11  . diltiazem (CARTIA XT) 240 MG 24 hr capsule Take 1 capsule (240 mg total) by mouth daily. 90 capsule 1  . esomeprazole (NEXIUM) 40 MG capsule Take 40 mg by mouth daily at 12 noon.    . furosemide (LASIX) 40 MG tablet Take 1 tablet (40 mg total) by mouth daily. 90 tablet 1  . losartan (COZAAR) 100 MG tablet Take 1 tablet (100 mg total) by mouth daily. 90 tablet 3  . Omega-3 Fatty Acids (OMEGA 3 PO) Take 1 capsule by mouth 2 (two) times daily.    . potassium chloride SA (K-DUR,KLOR-CON) 20 MEQ tablet Take 1 tablet (20 mEq total) by mouth daily. With fureosemide 90 tablet 1  . Red Yeast Rice 600 MG TABS Take 1 tablet by mouth 2 (two) times daily.    . traMADol (ULTRAM) 50 MG tablet Take 50 mg by mouth every 6 (six) hours as needed for moderate pain.  No current facility-administered medications on file prior to visit.    PAST MEDICAL HISTORY:   Past Medical History  Diagnosis Date  . Allergy   . Hypertension   . Anxiety   . Hyperlipidemia   . Arthritis     Left knee  . Multiple system atrophy C     PAST SURGICAL HISTORY:   Past Surgical History  Procedure Laterality Date  . Foot surgery  2011    3rd metatarsal LT foot  . Tonsillectomy and adenoidectomy    . Knee arthroscopy Left 02/08/2015    Procedure: LEFT KNEE ARTHROSCOPY ;  Surgeon: Latanya Maudlin, MD;  Location: [facility];  Service: Orthopedics;  Laterality: Left;    SOCIAL HISTORY:   Social History   Social History  . Marital Status: Divorced    Spouse Name: N/A  . Number of Children: 7  . Years of Education: 12   Occupational History  . ACTIVITY COORDINATOR    Social History Main Topics  . Smoking status: Former Smoker    Quit date: 03/14/1988  . Smokeless tobacco: Never Used  . Alcohol Use: No  . Drug Use: No  . Sexual Activity: Not on file   Other Topics Concern  . Not on file   Social History Narrative    FAMILY HISTORY:   Family Status  Relation Status Death Age  . Mother Deceased 69    diabetes, kidney failure, melanoma  . Father Deceased 62    COPD  . Brother Deceased     brain cancer  . Sister Alive     healthy  . Daughter Alive     healthy  . Daughter Alive     healthy  . Daughter Alive     healthy  . Daughter Alive     healthy  . Daughter Alive     healthy  . Son Alive     healthy  . Son Alive     healthy    ROS:  A complete 10 system review of systems was obtained and was unremarkable apart from what is mentioned above.  PHYSICAL EXAMINATION:    VITALS:   Filed Vitals:   06/13/15 1258  BP: 122/74  Pulse: 96  Height: 5\' 7"  (1.702 m)  Weight: 209 lb (94.802 kg)   Wt Readings from Last 3 Encounters:  06/13/15 209 lb (94.802 kg)  04/21/15 208 lb 6.4 oz (94.53 kg)  02/08/15 215 lb (97.523 kg)     GEN:  The patient appears stated age and is in NAD. HEENT:  Normocephalic, atraumatic.  The mucous membranes are moist. The superficial temporal arteries are without ropiness or tenderness. CV:  RRR Lungs:  CTAB Neck/HEME:  There are no carotid bruits bilaterally.  Neurological examination:  Orientation: The patient is alert and oriented x3.  Cranial nerves: There is good facial symmetry.   The visual fields are full to confrontational testing. The speech is fluent and clear. Soft palate rises symmetrically and there is no tongue deviation. Hearing  is intact to conversational tone. Sensation: Sensation is intact to light touch throughout. Motor: Strength is 5/5 in the bilateral upper and lower extremities.   Shoulder shrug is equal and symmetric.  There is no pronator drift.   Movement examination: Tone: There is increased tone in the right upper extremity,  mild-moderate.  Tone in the RLE is normal.  The tone in the LUE/LLE is normal Abnormal movements: No tremor noted today and no dyskinesia today. Coordination:  She  had no decremation with rapid alternating movements today. Gait and Station: The patient gets out of the chair by pushing off.  Gait is antalgic.  She is no longer dragging the right leg, but gait is very abnormal, because of structural abnormalities and bowing of the legs, especially on the left.  She has significant postural instability. Doesn't shuffle.        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      -She is now on carbidopa/levodopa 25/100, but has not been able to tolerate going up more than 1 tablet 4 times per day.  Going to try and move the levodopa dosages closer together so that she takes 1 tablet at 8 AM/11 AM/3 PM/7 PM and then add carbidopa/levodopa 50/200 at night, which hopefully will help prevent the cramping at night and does not make the nausea worse.  -She has an evaluation for physical and occupational therapy in the near future.  I think she will need to be enrolled and I will send a note to those therapists. 2.  Dysphagia.  -MBE was performed on 07/01/14 and it was normal and a regular, thin liquid diet was recommended.  -She had EGD which revealed evidence of gastric esophageal reflux disease, esophageal stenosis and a medium sized hiatal hernia.  She is now on soft diet because of this and ulcers from indocin.  Repeat EGD scheduled for September. 3.  Knee pain.    -She had an arthroscopic surgery, but likely is going to need a knee replacement in the future.  This also  contributes to gait issues 4.  followup in the next few months, sooner should new neurologic issues arise.

## 2015-06-20 ENCOUNTER — Ambulatory Visit: Payer: BLUE CROSS/BLUE SHIELD | Attending: Gastroenterology | Admitting: Physical Therapy

## 2015-06-20 ENCOUNTER — Ambulatory Visit (INDEPENDENT_AMBULATORY_CARE_PROVIDER_SITE_OTHER): Payer: BLUE CROSS/BLUE SHIELD | Admitting: Family Medicine

## 2015-06-20 ENCOUNTER — Ambulatory Visit: Payer: BLUE CROSS/BLUE SHIELD | Admitting: Occupational Therapy

## 2015-06-20 VITALS — BP 130/80 | HR 87 | Temp 97.8°F | Resp 16 | Ht 67.0 in | Wt 212.0 lb

## 2015-06-20 DIAGNOSIS — G238 Other specified degenerative diseases of basal ganglia: Secondary | ICD-10-CM | POA: Diagnosis not present

## 2015-06-20 DIAGNOSIS — L299 Pruritus, unspecified: Secondary | ICD-10-CM | POA: Diagnosis not present

## 2015-06-20 DIAGNOSIS — R269 Unspecified abnormalities of gait and mobility: Secondary | ICD-10-CM

## 2015-06-20 DIAGNOSIS — D509 Iron deficiency anemia, unspecified: Secondary | ICD-10-CM | POA: Diagnosis not present

## 2015-06-20 DIAGNOSIS — R279 Unspecified lack of coordination: Secondary | ICD-10-CM

## 2015-06-20 LAB — POCT CBC
Granulocyte percent: 73.2 %G (ref 37–80)
HEMATOCRIT: 35.9 % — AB (ref 37.7–47.9)
HEMOGLOBIN: 11 g/dL — AB (ref 12.2–16.2)
LYMPH, POC: 1.9 (ref 0.6–3.4)
MCH, POC: 23.8 pg — AB (ref 27–31.2)
MCHC: 30.5 g/dL — AB (ref 31.8–35.4)
MCV: 77.8 fL — AB (ref 80–97)
MID (CBC): 0.5 (ref 0–0.9)
MPV: 7.5 fL (ref 0–99.8)
POC GRANULOCYTE: 6.4 (ref 2–6.9)
POC LYMPH %: 21.2 % (ref 10–50)
POC MID %: 5.6 % (ref 0–12)
Platelet Count, POC: 309 10*3/uL (ref 142–424)
RBC: 4.61 M/uL (ref 4.04–5.48)
RDW, POC: 20 %
WBC: 8.8 10*3/uL (ref 4.6–10.2)

## 2015-06-20 LAB — COMPREHENSIVE METABOLIC PANEL
ALBUMIN: 4.5 g/dL (ref 3.6–5.1)
ALK PHOS: 148 U/L — AB (ref 33–130)
ALT: 7 U/L (ref 6–29)
AST: 16 U/L (ref 10–35)
BILIRUBIN TOTAL: 0.3 mg/dL (ref 0.2–1.2)
BUN: 14 mg/dL (ref 7–25)
CO2: 29 mmol/L (ref 20–31)
CREATININE: 0.7 mg/dL (ref 0.50–1.05)
Calcium: 9.9 mg/dL (ref 8.6–10.4)
Chloride: 100 mmol/L (ref 98–110)
Glucose, Bld: 81 mg/dL (ref 65–99)
Potassium: 4.3 mmol/L (ref 3.5–5.3)
SODIUM: 143 mmol/L (ref 135–146)
TOTAL PROTEIN: 7.5 g/dL (ref 6.1–8.1)

## 2015-06-20 NOTE — Therapy (Signed)
[facility] [address] [place], [name], [address] Phone: [phone]   Fax:  [phone]  Patient Details  Name: Jacqueline Atkinson MRN: [id number] Date of Birth: 1956-09-13 Referring Provider:  Shawnee Knapp, MD  Encounter Date: 06/20/2015  Occupational Therapy Parkinson's Disease Screen  9-hole peg test:    RUE  26.75        LUE  27.25sec  Box & Blocks Test:   RUE  50        LUE  59 blocks   Change in ability to perform ADLs/IADLs:  Using LUE more than dominant RUE now for tasks.  Pt reports incr rigidity with RUE with use.  Other Comments:  Pt s/p arthroscopic knee surgery, but reports possible future knee replacement surgery.  Pt reports financial concerns with possible insurance visit limits as she is considering another knee surgery.  Pt to call to inform OT/PT whether she wants to pursue therapy now vs. waiting until after possible knee replacement surgery.  Pt would benefit from occupational therapy evaluation due to:  Decline in dominant RUE functional use, decreased LUE coordination when compared with measurements from last OT screen 11/10/14, but may wait due to insurance visit limits.  Pt to call if she wants to pursue therapy once she checks her insurance benefits.    [facility] 06/20/2015, 1:46 PM  [facility] [address] [place], [name], [address] Phone: [phone]   Fax:  [place], OTR/L 06/20/2015 1:46 PM

## 2015-06-20 NOTE — Patient Instructions (Signed)
Stop your iron and associated medications that you were started on 6 weeks ago. If the itching improves over the next week you can start these back, starting only one of the medicines at a time and adding one every 3 days or so. If itching occurs suspect that that may be the cause.  Take an anti-histamine daily. Recommend Zyrtec (cetirizine) one daily. There is a chance of it causing drowsiness. If excessive drowsiness try either Claritin (loratadine) or Allegra (fexofenadine)  Consider bathing every other day. Excessive drying of the skin can aggravate itching.  We will let you know the results of the labs in a few days  If not improving over the next couple weeks or if getting worse at any time please return.

## 2015-06-20 NOTE — Progress Notes (Addendum)
Itching Subjective:  Patient ID: Jacqueline Atkinson, female    DOB: 03/07/1956  Age: 59 y.o. MRN: [id number]  Patient is here complaining of itching. This has been going on for the last 9 days. It began in the back of her scalp and moved over her scalp and then around her face and neck now upper trunk and upper arms. Knows of no cause of this. He has never had it before. Has not had a different rash that her daughter noted that it was a little red and wondered whether she might be having hives. She has not had any welts. She is not taking any medications for it. She has not had any recent changes in her medication, and medication list was reviewed with her. She did get started on medications about 6 weeks ago for her anemia including iron, vitamin C, B vitamins, B12. She has been on these for over a month before the symptoms began however.  She is a long list of medications for her neurologic weakness problems.   Objective:   Pleasant lady, healthy appearance, walks with a walker. Alert and oriented. No obvious rashes. Although ears itch they look normal. Her throat is normal with no oral lesions. 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 Her neck does not have any rashes. There is a little bit of dry skin on the upper back. Arms appear normal. Walks with a walker with assistance.  Assessment & Plan:   Assessment:  Nonspecific itching, possibly related to the iron or other recently added agents Iron deficiency anemia Multisystem atrophy with polypharmacy related to that  Plan:  See instructions Medications certainly can be a cause of nonspecific itching. This may be difficult to figure out. We'll start with the most recent medication changes.  Results for orders placed or performed in visit on 06/20/15  POCT CBC  Result Value Ref Range   WBC 8.8 4.6 - 10.2 K/uL   Lymph, poc 1.9 0.6 - 3.4   POC LYMPH PERCENT 21.2 10 - 50 %L   MID (cbc) 0.5 0 - 0.9   POC MID % 5.6 0 - 12 %M   POC  Granulocyte 6.4 2 - 6.9   Granulocyte percent 73.2 37 - 80 %G   RBC 4.61 4.04 - 5.48 M/uL   Hemoglobin 11.0 (A) 12.2 - 16.2 g/dL   HCT, POC 35.9 (A) 37.7 - 47.9 %   MCV 77.8 (A) 80 - 97 fL   MCH, POC 23.8 (A) 27 - 31.2 pg   MCHC 30.5 (A) 31.8 - 35.4 g/dL   RDW, POC 20.0 %   Platelet Count, POC 309 142 - 424 K/uL   MPV 7.5 0 - 99.8 fL     Patient Instructions  Stop your iron and associated medications that you were started on 6 weeks ago. If the itching improves over the next week you can start these back, starting only one of the medicines at a time and adding one every 3 days or so. If itching occurs suspect that that may be the cause.  Take an anti-histamine daily. Recommend Zyrtec (cetirizine) one daily. There is a chance of it causing drowsiness. If excessive drowsiness try either Claritin (loratadine) or Allegra (fexofenadine)  Consider bathing every other day. Excessive drying of the skin can aggravate itching.  We will let you know the results of the labs in a few days  If not improving over the next couple weeks or if getting worse at any time please  return.  If symptoms continue to persist we can try calling in some hydroxyzine or Periactin for her, however this will tend to cause more drowsiness. Dermatology referral also if symptoms persist  Starsha Morning, MD 06/20/2015

## 2015-06-20 NOTE — Therapy (Signed)
[facility] [address] [place], [name], [address] Phone: [phone]   Fax:  [phone]  Patient Details  Name: Jacqueline Atkinson MRN: [id number] Date of Birth: 1956-05-16 Referring Provider:  Ronald Lobo, MD  Encounter Date: 06/20/2015  Physical Therapy Parkinson's Disease Screen   Timed Up and Go test:  19.69 seconds with Rollator  10 meter walk test: 1.60 ft/second with Rollator  5 time sit to stand test:  20.78 with bilateral UE assist;pt unable to perform without UE assist   Pt states her L knee is "bone on bone" and MD recommending L TKR but pt still undecided about operation.  Pt feels like her mobility has declined but feels like it is due to L knee pain.  Pt also concerned over PT visit limit with her insurance if she has operation and she has already had PT earlier in the year after knee arthroscopy.  Pt is to contact [facility] concerning her visit limit and number of visits already used.  Also discussed PT recommendation (if pt has TKR) to have initial therapy for TKR at an orthopedic PT facility then transfer to [place] for further mobility related to her MSA.   (Patient would benefit from Physical Therapy evaluation due to slowed functional mobility and no therapy in > one year.  However, patient is unsure about starting physical therapy to address PD-specific deficits at this time due to uncertainty about knee surgery and potential visit limit-MARRIOTT,AMY W., PT)    Narda Bonds 06/20/2015, 1:27 PM Loma Sender, PT 06/27/2015 2:13 PM Phone: [phone] Fax: [facility] [address] [place], [name], [address] Phone: [phone]   Fax:  [place], [place] 06/20/2015 1:47 PM Phone: [phone] Fax: [phone]

## 2015-07-24 ENCOUNTER — Ambulatory Visit (INDEPENDENT_AMBULATORY_CARE_PROVIDER_SITE_OTHER): Payer: BLUE CROSS/BLUE SHIELD | Admitting: Physician Assistant

## 2015-07-24 VITALS — BP 118/66 | HR 92 | Temp 98.5°F | Resp 18 | Ht 68.0 in | Wt 212.0 lb

## 2015-07-24 DIAGNOSIS — L299 Pruritus, unspecified: Secondary | ICD-10-CM | POA: Diagnosis not present

## 2015-07-24 DIAGNOSIS — B850 Pediculosis due to Pediculus humanus capitis: Secondary | ICD-10-CM

## 2015-07-24 MED ORDER — TRIAMCINOLONE ACETONIDE 0.025 % EX OINT: 1.0000 "application " | TOPICAL_OINTMENT | Freq: Two times a day (BID) | CUTANEOUS | Status: DC

## 2015-07-24 NOTE — Progress Notes (Signed)
Subjective:     Patient ID: Jacqueline Atkinson, female   DOB: 1956-03-01, 59 y.o.   MRN: [id number] PCP: [name], MD  Chief Complaint  Patient presents with  . Follow-up    itching not getting better     HPI  Patient presents for follow-up of a rash. She was seen in this clinic on 08/23 for this problem. The rash first appeared on 08/16,  on the back of her scalp and then involved her face, upper trunk, and upper arms. She cannot recall any new lotions, detergents, or other external factors. Prior to this rash, she was started on iron, vitamin B, vitamin B12, and vitamin C. She was counseled to stop these medications and see if the rash cleared. She is also taking Zyrtec daily for the itching and Benadryl at night. Since her visit on 08/23, the rash now only involves her scalp, face, and neck, mostly the "crown of her head." She has tried head and shoulders anti-itch shampoo with no relief. She is taking fewer baths and showers and applying moisturizing lotion after bathing which has helped a little. The itching on her scalp is extremely bothersome to her.  Review of Systems  Constitutional: Negative for fever and chills.  Skin:       Positive for itching.  See HPI    Patient Active Problem List   Diagnosis Date Noted  . 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 Gait difficulty 12/29/2013  . Back pain 12/29/2013  . Knee pain 12/19/2012  . Paresthesia 12/19/2012  . Hypertension 11/28/2012  . Polypharmacy 11/28/2012  . GERD (gastroesophageal reflux disease) 11/28/2012    Prior to Admission medications   Medication Sig Start Date End Date Taking? Authorizing Provider  acetaminophen (TYLENOL) 500 MG tablet Take 1,000 mg by mouth 2 (two) times daily as needed for pain.   Yes Historical Provider, MD  carbidopa-levodopa (SINEMET CR) 50-200 MG per tablet Take 1 tablet by mouth at bedtime. 06/13/15  Yes Rebecca S Tat, DO  carbidopa-levodopa (SINEMET IR) 25-100 MG per tablet Take 1 tablet 4 times a  day 04/10/15  Yes Rebecca S Tat, DO  cholecalciferol (VITAMIN D) 1000 UNITS tablet Take 1,000 Units by mouth daily.   Yes Historical Provider, MD  clonazePAM (KLONOPIN) 0.5 MG tablet Take 1 tablet (0.5 mg total) by mouth at bedtime. 04/21/15  Yes Shawnee Knapp, MD  cyclobenzaprine (FLEXERIL) 10 MG tablet Take 1 tablet (10 mg total) by mouth 3 (three) times daily as needed for muscle spasms. 01/20/15  Yes Shawnee Knapp, MD  diclofenac sodium (VOLTAREN) 1 % GEL Apply 4 g topically 2 (two) times daily. To knees for severe arthritis 04/21/15  Yes Shawnee Knapp, MD  diltiazem (CARTIA XT) 240 MG 24 hr capsule Take 1 capsule (240 mg total) by mouth daily. 05/02/15  Yes Shawnee Knapp, MD  esomeprazole (NEXIUM) 40 MG capsule Take 40 mg by mouth daily at 12 noon.   Yes Historical Provider, MD  furosemide (LASIX) 40 MG tablet Take 1 tablet (40 mg total) by mouth daily. 04/21/15  Yes Shawnee Knapp, MD  losartan (COZAAR) 100 MG tablet Take 1 tablet (100 mg total) by mouth daily. 01/20/15  Yes Shawnee Knapp, MD  Omega-3 Fatty Acids (OMEGA 3 PO) Take 1 capsule by mouth 2 (two) times daily.   Yes Historical Provider, MD  omeprazole (PRILOSEC) 40 MG capsule  06/09/15  Yes Historical Provider, MD  Red Yeast Rice 600 MG TABS Take 1 tablet  by mouth 2 (two) times daily.   Yes Historical Provider, MD  traMADol (ULTRAM) 50 MG tablet Take 50 mg by mouth every 6 (six) hours as needed for moderate pain.   Yes Historical Provider, MD  vitamin B-12 (CYANOCOBALAMIN) 1000 MCG tablet Take 1,000 mcg by mouth daily.   Yes Historical Provider, MD  Ascorbic Acid (VITAMIN C) 100 MG tablet Take 100 mg by mouth daily.    Historical Provider, MD  b complex vitamins tablet Take 1 tablet by mouth daily.    Historical Provider, MD  ferrous sulfate 325 (65 FE) MG tablet Take 325 mg by mouth daily with breakfast.    Historical Provider, MD  potassium chloride SA (K-DUR,KLOR-CON) 20 MEQ tablet Take 1 tablet (20 mEq total) by mouth daily. With fureosemide Patient not  taking: Reported on 06/20/2015 04/21/15   Shawnee Knapp, MD     Allergies  Allergen Reactions  . Sulfa Antibiotics     Long time ago and does not remember reaction      Objective:  Physical Exam  Constitutional: She is oriented to person, place, and time. She appears well-developed and well-nourished.  HENT:  Head: Normocephalic and atraumatic.  Neurological: She is alert and oriented to person, place, and time.  Skin: Skin is warm and dry. Rash noted.  Multiple papules on the back of the neck, some erythematous and some skin-colored.  No rash noted on the backs of the ears.  Multiple nits noted in the scalp. One active louse identified.   Psychiatric: She has a normal mood and affect. Her behavior is normal. Thought content normal.    BP 118/66 mmHg  Pulse 92  Temp(Src) 98.5 F (36.9 C) (Oral)  Resp 18  Ht 5\' 8"  (1.727 m)  Wt 212 lb (96.163 kg)  BMI 32.24 kg/m2  SpO2 99%   Assessment & Plan:  1. Pruritus Apply triamcinolone to back of neck and chest 2-3 times/day as needed.  - triamcinolone (KENALOG) 0.025 % ointment; Apply 1 application topically 2 (two) times daily.  Dispense: 30 g; Refill: 0  2. Head lice Patient given instructions on treatment of head lice with Cetaphil cleanser. We will refer to dermatology if this treatment and triamcinolone do not relieve her symptoms.    Amber D. Race, PA-S Physician Assistant Student [facility]

## 2015-07-24 NOTE — Patient Instructions (Addendum)
[email or web address].org/  USE THE ABOVE LINK FOR TREATMENT OF HEAD LICE WITH CETAPHIL CLEANSER.  Use the steroid ointment on the back of the neck and chest 2-3 times a day as needed.

## 2015-07-24 NOTE — Progress Notes (Signed)
Patient ID: Jacqueline Atkinson, female    DOB: Jan 11, 1956, 59 y.o.   MRN: [id number]  PCP: [name], MD  Subjective:   Chief Complaint  Patient presents with  . Follow-up    itching not getting better     HPI Presents for follow-up of itching.   The rash first appeared on 08/16, on the back of her scalp and then involved her face, upper trunk, and upper arms. She cannot recall any new lotions, detergents, or other external factors. Prior to this rash, she was started on iron, vitamin B, vitamin B12, and vitamin C. She was counseled to stop these medications when she presented here for evaluation on 9/23 to see if the rash cleared. She is also taking Zyrtec daily for the itching and Benadryl at night. Since her visit on 08/23, the itching now only involves her scalp, face, and neck, mostly the "crown of her head." She has tried head and shoulders anti-itch shampoo with no relief. She is taking fewer baths and showers and applying moisturizing lotion after bathing which has helped a little. The itching on her scalp is extremely bothersome to her.   Review of Systems  Constitutional: Negative for fever and chills.  Skin: Negative for rash (itching).       Patient Active Problem List   Diagnosis Date Noted  . 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 Gait difficulty 12/29/2013  . Back pain 12/29/2013  . Knee pain 12/19/2012  . Paresthesia 12/19/2012  . Hypertension 11/28/2012  . Polypharmacy 11/28/2012  . GERD (gastroesophageal reflux disease) 11/28/2012     Prior to Admission medications   Medication Sig Start Date End Date Taking? Authorizing Provider  acetaminophen (TYLENOL) 500 MG tablet Take 1,000 mg by mouth 2 (two) times daily as needed for pain.   Yes Historical Provider, MD  carbidopa-levodopa (SINEMET CR) 50-200 MG per tablet Take 1 tablet by mouth at bedtime. 06/13/15  Yes Rebecca S Tat, DO  carbidopa-levodopa (SINEMET IR) 25-100 MG per tablet Take 1 tablet 4 times a day  04/10/15  Yes Rebecca S Tat, DO  cholecalciferol (VITAMIN D) 1000 UNITS tablet Take 1,000 Units by mouth daily.   Yes Historical Provider, MD  clonazePAM (KLONOPIN) 0.5 MG tablet Take 1 tablet (0.5 mg total) by mouth at bedtime. 04/21/15  Yes Shawnee Knapp, MD  cyclobenzaprine (FLEXERIL) 10 MG tablet Take 1 tablet (10 mg total) by mouth 3 (three) times daily as needed for muscle spasms. 01/20/15  Yes Shawnee Knapp, MD  diclofenac sodium (VOLTAREN) 1 % GEL Apply 4 g topically 2 (two) times daily. To knees for severe arthritis 04/21/15  Yes Shawnee Knapp, MD  diltiazem (CARTIA XT) 240 MG 24 hr capsule Take 1 capsule (240 mg total) by mouth daily. 05/02/15  Yes Shawnee Knapp, MD  esomeprazole (NEXIUM) 40 MG capsule Take 40 mg by mouth daily at 12 noon.   Yes Historical Provider, MD  furosemide (LASIX) 40 MG tablet Take 1 tablet (40 mg total) by mouth daily. 04/21/15  Yes Shawnee Knapp, MD  losartan (COZAAR) 100 MG tablet Take 1 tablet (100 mg total) by mouth daily. 01/20/15  Yes Shawnee Knapp, MD  Omega-3 Fatty Acids (OMEGA 3 PO) Take 1 capsule by mouth 2 (two) times daily.   Yes Historical Provider, MD  omeprazole (PRILOSEC) 40 MG capsule  06/09/15  Yes Historical Provider, MD  Red Yeast Rice 600 MG TABS Take 1 tablet by mouth 2 (two) times daily.  Yes Historical Provider, MD  traMADol (ULTRAM) 50 MG tablet Take 50 mg by mouth every 6 (six) hours as needed for moderate pain.   Yes Historical Provider, MD  vitamin B-12 (CYANOCOBALAMIN) 1000 MCG tablet Take 1,000 mcg by mouth daily.   Yes Historical Provider, MD  Ascorbic Acid (VITAMIN C) 100 MG tablet Take 100 mg by mouth daily.    Historical Provider, MD  b complex vitamins tablet Take 1 tablet by mouth daily.    Historical Provider, MD  ferrous sulfate 325 (65 FE) MG tablet Take 325 mg by mouth daily with breakfast.    Historical Provider, MD  potassium chloride SA (K-DUR,KLOR-CON) 20 MEQ tablet Take 1 tablet (20 mEq total) by mouth daily. With fureosemide Patient not  taking: Reported on 06/20/2015 04/21/15   Shawnee Knapp, MD     Allergies  Allergen Reactions  . Sulfa Antibiotics     Long time ago and does not remember reaction       Objective:  Physical Exam  Constitutional: She is oriented to person, place, and time. She appears well-developed and well-nourished. She is active and cooperative. No distress.  BP 118/66 mmHg  Pulse 92  Temp(Src) 98.5 F (36.9 C) (Oral)  Resp 18  Ht 5\' 8"  (1.727 m)  Wt 212 lb (96.163 kg)  BMI 32.24 kg/m2  SpO2 99%   Eyes: Conjunctivae are normal.  Pulmonary/Chest: Effort normal.  Neurological: She is alert and oriented to person, place, and time.  Skin: Skin is warm, dry and intact. There is erythema (the nape of the neck is notable for erythema but no lesions. The skin of the scalp, back of the ears and anterior chest are without lesions.).  Numerous nits noted. One live louse found.  Psychiatric: She has a normal mood and affect. Her speech is normal and behavior is normal.           Assessment & Plan:   1. Pruritus 2. Head lice Treat the lice with Cetaphil cleanser, following the instruction on [email or web address]. For the upper chest and nape of the neck, she can use steroid cream as needed. Hold off on dermatology referral unless her symptoms persist after treatment of the lice. - triamcinolone (KENALOG) 0.025 % ointment; Apply 1 application topically 2 (two) times daily.  Dispense: 30 g; Refill: 0    Fara Chute, PA-C Physician Assistant-Certified [facility]

## 2015-07-26 ENCOUNTER — Telehealth: Payer: Self-pay | Admitting: Neurology

## 2015-07-26 NOTE — Telephone Encounter (Signed)
IF that is all the form needs to say is that I prefer spinal anesthesia over general, but I suspect that they need more clearance than that

## 2015-07-26 NOTE — Telephone Encounter (Signed)
Patient made aware I had forwarded the clearance request to her PCP. She states that she has asked them not to put her under general anesthesia, she needs a note from [place] saying spinal is safer but she can be put under general if needed. Would this come from our office?

## 2015-07-26 NOTE — Telephone Encounter (Signed)
Letter written and faxed to Dr Gladstone Lighter - patient made aware.

## 2015-07-26 NOTE — Telephone Encounter (Signed)
Pt states that she is needing cleared for surgery and a form was faxed to [place] from Dr Emilio Aspen office and she wants to know the status of that form being done please call her at [phone]

## 2015-07-27 ENCOUNTER — Other Ambulatory Visit: Payer: Self-pay | Admitting: Surgical

## 2015-08-09 ENCOUNTER — Ambulatory Visit (HOSPITAL_COMMUNITY)
Admission: RE | Admit: 2015-08-09 | Discharge: 2015-08-09 | Disposition: A | Discharge status: Home / Self Care | Payer: BLUE CROSS/BLUE SHIELD | Source: Ambulatory Visit | Attending: Surgical | Admitting: Surgical

## 2015-08-09 ENCOUNTER — Encounter (HOSPITAL_COMMUNITY)
Admission: RE | Admit: 2015-08-09 | Discharge: 2015-08-09 | Disposition: A | Discharge status: Home / Self Care | Payer: BLUE CROSS/BLUE SHIELD | Source: Ambulatory Visit | Attending: Orthopedic Surgery | Admitting: Orthopedic Surgery

## 2015-08-09 ENCOUNTER — Encounter (HOSPITAL_COMMUNITY): Payer: Self-pay

## 2015-08-09 DIAGNOSIS — Z01812 Encounter for preprocedural laboratory examination: Secondary | ICD-10-CM | POA: Diagnosis not present

## 2015-08-09 DIAGNOSIS — Z01818 Encounter for other preprocedural examination: Secondary | ICD-10-CM

## 2015-08-09 DIAGNOSIS — M179 Osteoarthritis of knee, unspecified: Secondary | ICD-10-CM | POA: Diagnosis not present

## 2015-08-09 HISTORY — DX: Cardiac murmur, unspecified: R01.1

## 2015-08-09 HISTORY — DX: Gastro-esophageal reflux disease without esophagitis: K21.9

## 2015-08-09 HISTORY — DX: Myoneural disorder, unspecified: G70.9

## 2015-08-09 HISTORY — DX: Personal history of other diseases of the digestive system: Z87.19

## 2015-08-09 HISTORY — DX: Anemia, unspecified: D64.9

## 2015-08-09 HISTORY — DX: Allergy status to unspecified drugs, medicaments and biological substances: Z88.9

## 2015-08-09 HISTORY — DX: Joint disorder, unspecified: M25.9

## 2015-08-09 LAB — SURGICAL PCR SCREEN
MRSA, PCR: NEGATIVE
Staphylococcus aureus: NEGATIVE

## 2015-08-09 LAB — CBC WITH DIFFERENTIAL/PLATELET
Basophils Absolute: 0 10*3/uL (ref 0.0–0.1)
Basophils Relative: 0 %
Eosinophils Absolute: 0.2 10*3/uL (ref 0.0–0.7)
Eosinophils Relative: 2 %
HCT: 35.7 % — ABNORMAL LOW (ref 36.0–46.0)
Hemoglobin: 11.4 g/dL — ABNORMAL LOW (ref 12.0–15.0)
Lymphocytes Relative: 18 %
Lymphs Abs: 1.8 10*3/uL (ref 0.7–4.0)
MCH: 26 pg (ref 26.0–34.0)
MCHC: 31.9 g/dL (ref 30.0–36.0)
MCV: 81.5 fL (ref 78.0–100.0)
Monocytes Absolute: 0.6 10*3/uL (ref 0.1–1.0)
Monocytes Relative: 6 %
Neutro Abs: 7.3 10*3/uL (ref 1.7–7.7)
Neutrophils Relative %: 74 %
Platelets: 286 10*3/uL (ref 150–400)
RBC: 4.38 MIL/uL (ref 3.87–5.11)
RDW: 15.9 % — ABNORMAL HIGH (ref 11.5–15.5)
WBC: 9.9 10*3/uL (ref 4.0–10.5)

## 2015-08-09 LAB — URINALYSIS, ROUTINE W REFLEX MICROSCOPIC
Bilirubin Urine: NEGATIVE
Glucose, UA: NEGATIVE mg/dL
Hgb urine dipstick: NEGATIVE
Ketones, ur: NEGATIVE mg/dL
Leukocytes, UA: NEGATIVE
Nitrite: NEGATIVE
Protein, ur: NEGATIVE mg/dL
Specific Gravity, Urine: 1.009 (ref 1.005–1.030)
Urobilinogen, UA: 0.2 mg/dL (ref 0.0–1.0)
pH: 7.5 (ref 5.0–8.0)

## 2015-08-09 LAB — PROTIME-INR
INR: 1.02 (ref 0.00–1.49)
Prothrombin Time: 13.6 seconds (ref 11.6–15.2)

## 2015-08-09 NOTE — Pre-Procedure Instructions (Addendum)
!  0-12-16 EKG 4'16 -Epic. CXR done per order. Dr. Landry Dyke in for pre anesthesia consult- reviewed LOV notes 06-13-15 -Dr. Carles Collet, neurology, also note preferring patient have spinal anesthesia, rather than general.

## 2015-08-09 NOTE — Patient Instructions (Addendum)
[place]  08/09/2015   Your procedure is scheduled on:   10-25--2016  Enter through [place] follow signs to [facility]. Arrive at    0930    AM.  (Limit 1 person with you).  Call this number if you have problems the morning of surgery: [phone]  Or Presurgical Testing [phone].   For Living Will and/or Health Care Power Attorney Forms: please provide copy for your medical record,may bring AM of surgery(Forms should be already notarized -we do not provide this service).(08-09-15 No information provided pt. to update information)   Do not eat food/ or drink: After Midnight.  Exception: may have clear liquids:up to 6 Hours before arrival. Nothing after: 0600 AM  Clear liquids include soda, tea, black coffee, apple or grape juice, broth.  Take these medicines the morning of surgery with A SIP OF WATER-   (DO NOT TAKE ANY DIABETIC MEDS AM OF SURGERY) : Carbidopa. Diltiazem. Nexium.   Do not wear jewelry, make-up or nail polish.  Do not wear deodorant, lotions, powders, or perfumes.   Do not shave legs and under arms- 48 hours(2 days) prior to first CHG shower.(Shaving face and neck okay.)  Do not bring valuables to the hospital.(Hospital is not responsible for lost valuables).  Contacts, dentures or removable bridgework, body piercing, hair pins may not be worn into surgery.  Leave suitcase in the car. After surgery it may be brought to your room.  For patients admitted to the hospital, checkout time is 11:00 AM the day of discharge.(Restricted visitors-Any Persons displaying flu-like symptoms or illness).    Patients discharged the day of surgery will not be allowed to drive home. Must have responsible person with you x 24 hours once discharged.  Name and phone number of your driver: son- Jacqueline Atkinson- [phone] cell     Please read over the following fact sheets that you were given:  CHG(Chlorhexidine Gluconate 4% Surgical Soap)  use, MRSA Information, Blood Transfusion fact sheet, Incentive Spirometry Instruction.  Remember : Type/Screen "Blue armbands" - may not be removed once applied(would result in being retested AM of surgery, if removed).         [facility] - Preparing for Surgery Before surgery, you can play an important role.  Because skin is not sterile, your skin needs to be as free of germs as possible.  You can reduce the number of germs on your skin by washing with CHG (chlorahexidine gluconate) soap before surgery.  CHG is an antiseptic cleaner which kills germs and bonds with the skin to continue killing germs even after washing. Please DO NOT use if you have an allergy to CHG or antibacterial soaps.  If your skin becomes reddened/irritated stop using the CHG and inform your nurse when you arrive at Short Stay. Do not shave (including legs and underarms) for at least 48 hours prior to the first CHG shower.  You may shave your face/neck. Please follow these instructions carefully:  1.  Shower with CHG Soap the night before surgery and the  morning of Surgery.  2.  If you choose to wash your hair, wash your hair first as usual with your  normal  shampoo.  3.  After you shampoo, rinse your hair and body thoroughly to remove the  shampoo.                           4.  Use  CHG as you would any other liquid soap.  You can apply chg directly  to the skin and wash                       Gently with a scrungie or clean washcloth.  5.  Apply the CHG Soap to your body ONLY FROM THE NECK DOWN.   Do not use on face/ open                           Wound or open sores. Avoid contact with eyes, ears mouth and genitals (private parts).                       Wash face,  Genitals (private parts) with your normal soap.             6.  Wash thoroughly, paying special attention to the area where your surgery  will be performed.  7.  Thoroughly rinse your body with warm water from the neck down.  8.  DO NOT shower/wash with  your normal soap after using and rinsing off  the CHG Soap.                9.  Pat yourself dry with a clean towel.            10.  Wear clean pajamas.            11.  Place clean sheets on your bed the night of your first shower and do not  sleep with pets. Day of Surgery : Do not apply any lotions/deodorants the morning of surgery.  Please wear clean clothes to the hospital/surgery center.  FAILURE TO FOLLOW THESE INSTRUCTIONS MAY RESULT IN THE CANCELLATION OF YOUR SURGERY PATIENT SIGNATURE_________________________________  NURSE SIGNATURE__________________________________  ________________________________________________________________________   Jacqueline Atkinson  An incentive spirometer is a tool that can help keep your lungs clear and active. This tool measures how well you are filling your lungs with each breath. Taking long deep breaths may help reverse or decrease the chance of developing breathing (pulmonary) problems (especially infection) following:  A long period of time when you are unable to move or be active. BEFORE THE PROCEDURE   If the spirometer includes an indicator to show your best effort, your nurse or respiratory therapist will set it to a desired goal.  If possible, sit up straight or lean slightly forward. Try not to slouch.  Hold the incentive spirometer in an upright position. INSTRUCTIONS FOR USE   Sit on the edge of your bed if possible, or sit up as far as you can in bed or on a chair.  Hold the incentive spirometer in an upright position.  Breathe out normally.  Place the mouthpiece in your mouth and seal your lips tightly around it.  Breathe in slowly and as deeply as possible, raising the piston or the ball toward the top of the column.  Hold your breath for 3-5 seconds or for as long as possible. Allow the piston or ball to fall to the bottom of the column.  Remove the mouthpiece from your mouth and breathe out normally.  Rest for a  few seconds and repeat Steps 1 through 7 at least 10 times every 1-2 hours when you are awake. Take your time and take a few normal breaths between deep breaths.  The spirometer may include an indicator to show your best  effort. Use the indicator as a goal to work toward during each repetition.  After each set of 10 deep breaths, practice coughing to be sure your lungs are clear. If you have an incision (the cut made at the time of surgery), support your incision when coughing by placing a pillow or rolled up towels firmly against it. Once you are able to get out of bed, walk around indoors and cough well. You may stop using the incentive spirometer when instructed by your caregiver.  RISKS AND COMPLICATIONS  Take your time so you do not get dizzy or light-headed.  If you are in pain, you may need to take or ask for pain medication before doing incentive spirometry. It is harder to take a deep breath if you are having pain. AFTER USE  Rest and breathe slowly and easily.  It can be helpful to keep track of a log of your progress. Your caregiver can provide you with a simple table to help with this. If you are using the spirometer at home, follow these instructions: [place] IF:   You are having difficultly using the spirometer.  You have trouble using the spirometer as often as instructed.  Your pain medication is not giving enough relief while using the spirometer.  You develop fever of 100.5 F (38.1 C) or higher. SEEK IMMEDIATE MEDICAL CARE IF:   You cough up bloody sputum that had not been present before.  You develop fever of 102 F (38.9 C) or greater.  You develop worsening pain at or near the incision site. MAKE SURE YOU:   Understand these instructions.  Will watch your condition.  Will get help right away if you are not doing well or get worse. Document Released: 02/24/2007 Document Revised: 01/06/2012 Document Reviewed: 04/27/2007 ExitCare Patient  Information 2014 ExitCare, [place].   ________________________________________________________________________  WHAT IS A BLOOD TRANSFUSION? Blood Transfusion Information  A transfusion is the replacement of blood or some of its parts. Blood is made up of multiple cells which provide different functions.  Red blood cells carry oxygen and are used for blood loss replacement.  White blood cells fight against infection.  Platelets control bleeding.  Plasma helps clot blood.  Other blood products are available for specialized needs, such as hemophilia or other clotting disorders. BEFORE THE TRANSFUSION  Who gives blood for transfusions?   Healthy volunteers who are fully evaluated to make sure their blood is safe. This is blood bank blood. Transfusion therapy is the safest it has ever been in the practice of medicine. Before blood is taken from a donor, a complete history is taken to make sure that person has no history of diseases nor engages in risky social behavior (examples are intravenous drug use or sexual activity with multiple partners). The donor's travel history is screened to minimize risk of transmitting infections, such as malaria. The donated blood is tested for signs of infectious diseases, such as HIV and hepatitis. The blood is then tested to be sure it is compatible with you in order to minimize the chance of a transfusion reaction. If you or a relative donates blood, this is often done in anticipation of surgery and is not appropriate for emergency situations. It takes many days to process the donated blood. RISKS AND COMPLICATIONS Although transfusion therapy is very safe and saves many lives, the main dangers of transfusion include:   Getting an infectious disease.  Developing a transfusion reaction. This is an allergic reaction to something in the  blood you were given. Every precaution is taken to prevent this. The decision to have a blood transfusion has been considered  carefully by your caregiver before blood is given. Blood is not given unless the benefits outweigh the risks. AFTER THE TRANSFUSION  Right after receiving a blood transfusion, you will usually feel much better and more energetic. This is especially true if your red blood cells have gotten low (anemic). The transfusion raises the level of the red blood cells which carry oxygen, and this usually causes an energy increase.  The nurse administering the transfusion will monitor you carefully for complications. HOME CARE INSTRUCTIONS  No special instructions are needed after a transfusion. You may find your energy is better. Speak with your caregiver about any limitations on activity for underlying diseases you may have. SEEK MEDICAL CARE IF:   Your condition is not improving after your transfusion.  You develop redness or irritation at the intravenous (IV) site. SEEK IMMEDIATE MEDICAL CARE IF:  Any of the following symptoms occur over the next 12 hours:  Shaking chills.  You have a temperature by mouth above 102 F (38.9 C), not controlled by medicine.  Chest, back, or muscle pain.  People around you feel you are not acting correctly or are confused.  Shortness of breath or difficulty breathing.  Dizziness and fainting.  You get a rash or develop hives.  You have a decrease in urine output.  Your urine turns a dark color or changes to pink, red, or brown. Any of the following symptoms occur over the next 10 days:  You have a temperature by mouth above 102 F (38.9 C), not controlled by medicine.  Shortness of breath.  Weakness after normal activity.  The white part of the eye turns yellow (jaundice).  You have a decrease in the amount of urine or are urinating less often.  Your urine turns a dark color or changes to pink, red, or brown. Document Released: 10/11/2000 Document Revised: 01/06/2012 Document Reviewed: 05/30/2008 [facility] Patient Information 2014 [place],  [place].  _______________________________________________________________________

## 2015-08-09 NOTE — Anesthesia Preprocedure Evaluation (Addendum)
Anesthesia Evaluation  Patient identified by MRN, date of birth, ID band Patient awake    Reviewed: Allergy & Precautions, NPO status , Patient's Chart, lab work & pertinent test results  Airway Mallampati: II  TM Distance: >3 FB Neck ROM: Full    Dental no notable dental hx. (+) Teeth Intact, Dental Advisory Given   Pulmonary former smoker,    Pulmonary exam normal breath sounds clear to auscultation       Cardiovascular Exercise Tolerance: Good hypertension, Pt. 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 Reviewed office note from recent neurology visit.  Cerebellar problems and autonomic instabiltiy - hypotension with General anesthesia.  Regional OK.  Uses cane and walker  Neuromuscular disease    GI/Hepatic Neg liver ROS, hiatal hernia, GERD  Medicated and Controlled,  Endo/Other  negative endocrine ROS  Renal/GU negative Renal ROS  negative genitourinary   Musculoskeletal  (+) Arthritis , Osteoarthritis,    Abdominal   Peds negative pediatric ROS (+)  Hematology negative hematology ROS (+)   Anesthesia Other Findings Patient dose have baseline gait instability, back pain, lower extremity paresthesia's from history of multiple systems atrophy..   Cursory literature review Veatrice Kells, 08/22/15): Overall no form of anesthesia is contraindicated, there can be issues with post op extubation, central sleep apnea, autonomic failure with orthostatic hypotension or even supine hypertension.. After discussion of risks and benefits specific to her, we agree on benefit of spinal anesthesia, which has been successfully used in multiple case reports and anesthesia texts  Reproductive/Obstetrics negative OB ROS                          Anesthesia Physical Anesthesia  Plan  ASA: III  Anesthesia Plan: Combined Spinal and Epidural   Post-op Pain Management:    Induction:   Airway Management Planned: Simple Face Mask  Additional Equipment:   Intra-op Plan:   Post-operative Plan:   Informed Consent: I have reviewed the patients History and Physical, chart, labs and discussed the procedure including the risks, benefits and alternatives for the proposed anesthesia with the patient or authorized representative who has indicated his/her understanding and acceptance.   Dental advisory given  Plan Discussed with: Surgeon and CRNA  Anesthesia Plan Comments:        Anesthesia Quick Evaluation

## 2015-08-10 LAB — COMPREHENSIVE METABOLIC PANEL
ALT: <5 U/L — ABNORMAL LOW (ref 14–54)
AST: 15 U/L (ref 15–41)
Albumin: 4.2 g/dL (ref 3.5–5.0)
Alkaline Phosphatase: 141 U/L — ABNORMAL HIGH (ref 38–126)
Anion gap: 8 (ref 5–15)
BUN: 14 mg/dL (ref 6–20)
CO2: 28 mmol/L (ref 22–32)
Calcium: 9.2 mg/dL (ref 8.9–10.3)
Chloride: 104 mmol/L (ref 101–111)
Creatinine, Ser: 0.74 mg/dL (ref 0.44–1.00)
GFR calc Af Amer: >60 mL/min (ref >60)
GFR calc non Af Amer: >60 mL/min (ref >60)
Glucose, Bld: 103 mg/dL — ABNORMAL HIGH (ref 65–99)
Potassium: 4.2 mmol/L (ref 3.5–5.1)
Sodium: 140 mmol/L (ref 135–145)
Total Bilirubin: 0.3 mg/dL (ref 0.3–1.2)
Total Protein: 7.8 g/dL (ref 6.5–8.1)

## 2015-08-16 NOTE — Progress Notes (Signed)
  Medical screening examination/treatment/procedure(s) were performed by non-physician practitioner and as supervising physician I was immediately available for consultation/collaboration.     

## 2015-08-16 NOTE — H&P (Signed)
TOTAL KNEE ADMISSION H&P  Patient is being admitted for left total knee arthroplasty.  Subjective:  Chief Complaint:left knee pain.  HPI: Jacqueline Atkinson, 59 y.o. female, has a history of pain and functional disability in the left knee due to arthritis and has failed non-surgical conservative treatments for greater than 12 weeks to includeNSAID's and/or analgesics, corticosteriod injections and activity modification.  Onset of symptoms was gradual, starting 4 years ago with gradually worsening course since that time. The patient noted prior procedures on the knee to include  arthroscopy and menisectomy on the left knee(s).  Patient currently rates pain in the left knee(s) at 7 out of 10 with activity. Patient has night pain, worsening of pain with activity and weight bearing, pain that interferes with activities of daily living, pain with passive range of motion, crepitus and joint swelling.  Patient has evidence of periarticular osteophytes and joint space narrowing by imaging studies.  There is no active infection.  Patient Active Problem List   Diagnosis Date Noted  . 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 Gait difficulty 12/29/2013  . Back pain 12/29/2013  . Knee pain 12/19/2012  . Paresthesia 12/19/2012  . Hypertension 11/28/2012  . Polypharmacy 11/28/2012  . GERD (gastroesophageal reflux disease) 11/28/2012   Past Medical History  Diagnosis Date  . Allergy   . Hypertension   . Anxiety   . Hyperlipidemia   . Arthritis     Left knee  . 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 Heart murmur   . Hx of seasonal allergies     occ. use OTC Mucinex or Antihistamine.  Marland Kitchen GERD (gastroesophageal reflux disease)   . History of hiatal hernia     small  . Anemia   . Neuromuscular disorder ([place])     "multi system atrophy disease"- "pain, bilateral foot numbness, left knee pain"- Dr. Carles Collet is following.  . Shoulder  disorder     right shoulder "rigidity due to Multi system atrophy"- ROM not limited"just tires me"  . Edema extremities     lower extremity edema left greater than right.  . Mobility impaired     ambulates with walker, tendency to lose balance.    Past Surgical History  Procedure Laterality Date  . Foot surgery  2011    3rd metatarsal LT foot  . Tonsillectomy and adenoidectomy    . Knee arthroscopy Left 02/08/2015    Procedure: LEFT KNEE ARTHROSCOPY ;  Surgeon: Latanya Maudlin, MD;  Location: [facility];  Service: Orthopedics;  Laterality: Left;  . Esophagogastroduodenoscopy endoscopy      with esophageal dilation     Current outpatient prescriptions:  .  acetaminophen (TYLENOL) 500 MG tablet, Take 1,000 mg by mouth every 4 (four) hours as needed for moderate pain. , Disp: , Rfl:  .  Ascorbic Acid (VITAMIN C) 100 MG tablet, Take 500 mg by mouth daily at 12 noon. , Disp: , Rfl:  .  b complex vitamins tablet, Take 1 tablet by mouth daily., Disp: , Rfl:  .  carbidopa-levodopa (SINEMET IR) 25-100 MG per tablet, Take 1 tablet 4 times a day, Disp: 120 tablet, Rfl: 4 .  Carbidopa-Levodopa ER (SINEMET CR) 25-100 MG tablet controlled release, Take 1 tablet by mouth at bedtime., Disp: , Rfl:  .  cholecalciferol (VITAMIN D) 1000 UNITS tablet, Take 1,000 Units by mouth daily., Disp: , Rfl:  .  diltiazem (CARTIA XT) 240  MG 24 hr capsule, Take 1 capsule (240 mg total) by mouth daily., Disp: 90 capsule, Rfl: 1 .  esomeprazole (NEXIUM) 40 MG capsule, Take 40 mg by mouth daily at 12 noon., Disp: , Rfl:  .  ferrous sulfate 325 (65 FE) MG tablet, Take 325 mg by mouth daily with breakfast., Disp: , Rfl:  .  furosemide (LASIX) 40 MG tablet, Take 1 tablet (40 mg total) by mouth daily., Disp: 90 tablet, Rfl: 1 .  losartan (COZAAR) 100 MG tablet, Take 1 tablet (100 mg total) by mouth daily., Disp: 90 tablet, Rfl: 3 .  Omega-3 Fatty Acids (OMEGA 3 PO), Take 1,000 mg by mouth 2 (two) times daily. , Disp: , Rfl:  .   omeprazole (PRILOSEC) 40 MG capsule, Take 40 mg by mouth daily. , Disp: , Rfl: 1 .  Red Yeast Rice 600 MG TABS, Take 1 tablet by mouth 2 (two) times daily., Disp: , Rfl:  .  vitamin B-12 (CYANOCOBALAMIN) 1000 MCG tablet, Take 1,000 mcg by mouth daily., Disp: , Rfl:   Allergies  Allergen Reactions  . Sulfa Antibiotics     Long time ago and does not remember reaction    Social History  Substance Use Topics  . Smoking status: Former Smoker -- 1.00 packs/day for 12 years    Quit date: 03/14/1988  . Smokeless tobacco: Never Used  . Alcohol Use: No    Family History  Problem Relation Age of Onset  . Cancer Mother   . Diabetes Mother   . Kidney disease Mother   . COPD Father   . Brain cancer Brother      Review of Systems  Constitutional: Negative for fever, chills, weight loss, malaise/fatigue and diaphoresis.  HENT: Negative.   Eyes: Negative.   Respiratory: Negative.   Cardiovascular: Negative.   Gastrointestinal: Negative.   Genitourinary: Negative.   Musculoskeletal: Positive for myalgias, back pain and joint pain. Negative for falls and neck pain.  Skin: Negative.   Neurological: Positive for weakness. Negative for dizziness, tingling, tremors, sensory change, speech change, focal weakness, seizures and loss of consciousness.  Endo/Heme/Allergies: Negative.   Psychiatric/Behavioral: Negative.     Objective:  Physical Exam  Constitutional: She is oriented to person, place, and time. She appears well-developed. No distress.  Overweight  HENT:  Head: Normocephalic and atraumatic.  Right Ear: External ear normal.  Left Ear: External ear normal.  Nose: Nose normal.  Mouth/Throat: Oropharynx is clear and moist.  Eyes: Conjunctivae and EOM are normal.  Neck: Normal range of motion. Neck supple.  Cardiovascular: Normal rate, regular rhythm, normal heart sounds and intact distal pulses.   No murmur heard. Respiratory: Effort normal and breath sounds normal. No respiratory  distress. She has no wheezes.  GI: Soft. Bowel sounds are normal. She exhibits no distension. There is no tenderness.  Musculoskeletal:       Right hip: Normal.       Left hip: Normal.       Right knee: Normal.       Left knee: She exhibits decreased range of motion and swelling. She exhibits no effusion and no erythema. Tenderness found. Medial joint line and lateral joint line tenderness noted.  Neurological: She is alert and oriented to person, place, and time.  Skin: No rash noted. She is not diaphoretic. No erythema.  Psychiatric: She has a normal mood and affect. Her behavior is normal.    Vitals  Weight: 208 lb Height: 68in Body Surface Area: 2.08 m  Body Mass Index: 31.63 kg/m  Pulse: 84 (Regular)  BP: 140/80 (Sitting, Left Arm, Standard)  Imaging Review Plain radiographs demonstrate severe degenerative joint disease of the left knee(s). The overall alignment ismild varus. The bone quality appears to be good for age and reported activity level.  Assessment/Plan:  End stage primary osteoarthritis, left knee   The patient history, physical examination, clinical judgment of the provider and imaging studies are consistent with end stage degenerative joint disease of the left knee(s) and total knee arthroplasty is deemed medically necessary. The treatment options including medical management, injection therapy arthroscopy and arthroplasty were discussed at length. The risks and benefits of total knee arthroplasty were presented and reviewed. The risks due to aseptic loosening, infection, stiffness, patella tracking problems, thromboembolic complications and other imponderables were discussed. The patient acknowledged the explanation, agreed to proceed with the plan and consent was signed. Patient is being admitted for inpatient treatment for surgery, pain control, PT, OT, prophylactic antibiotics, VTE prophylaxis, progressive ambulation and ADL's and discharge planning. The  patient is planning to be discharged home with home health services   PCP: Dr. Wells Guiles Tat    Ardeen Jourdain, PA-C

## 2015-08-22 ENCOUNTER — Inpatient Hospital Stay (HOSPITAL_COMMUNITY): Payer: BLUE CROSS/BLUE SHIELD | Admitting: Anesthesiology

## 2015-08-22 ENCOUNTER — Encounter (HOSPITAL_COMMUNITY)
Admission: RE | Disposition: A | Discharge status: Skilled Nursing Facility | Payer: Self-pay | Source: Ambulatory Visit | Attending: Orthopedic Surgery

## 2015-08-22 ENCOUNTER — Encounter (HOSPITAL_COMMUNITY): Payer: Self-pay | Admitting: *Deleted

## 2015-08-22 ENCOUNTER — Inpatient Hospital Stay (HOSPITAL_COMMUNITY)
Admission: RE | Admit: 2015-08-22 | Discharge: 2015-08-25 | DRG: 470 | Disposition: A | Discharge status: Skilled Nursing Facility | Payer: BLUE CROSS/BLUE SHIELD | Source: Ambulatory Visit | Attending: Orthopedic Surgery | Admitting: Orthopedic Surgery

## 2015-08-22 DIAGNOSIS — Z96659 Presence of unspecified artificial knee joint: Secondary | ICD-10-CM

## 2015-08-22 DIAGNOSIS — K219 Gastro-esophageal reflux disease without esophagitis: Secondary | ICD-10-CM | POA: Diagnosis present

## 2015-08-22 DIAGNOSIS — M1712 Unilateral primary osteoarthritis, left knee: Principal | ICD-10-CM | POA: Diagnosis present

## 2015-08-22 DIAGNOSIS — E785 Hyperlipidemia, unspecified: Secondary | ICD-10-CM | POA: Diagnosis present

## 2015-08-22 DIAGNOSIS — R112 Nausea with vomiting, unspecified: Secondary | ICD-10-CM | POA: Diagnosis not present

## 2015-08-22 DIAGNOSIS — I10 Essential (primary) hypertension: Secondary | ICD-10-CM | POA: Diagnosis present

## 2015-08-22 DIAGNOSIS — M24562 Contracture, left knee: Secondary | ICD-10-CM | POA: Diagnosis present

## 2015-08-22 DIAGNOSIS — Z87891 Personal history of nicotine dependence: Secondary | ICD-10-CM | POA: Diagnosis not present

## 2015-08-22 DIAGNOSIS — G709 Myoneural disorder, unspecified: Secondary | ICD-10-CM | POA: Diagnosis present

## 2015-08-22 DIAGNOSIS — R42 Dizziness and giddiness: Secondary | ICD-10-CM | POA: Diagnosis not present

## 2015-08-22 DIAGNOSIS — K449 Diaphragmatic hernia without obstruction or gangrene: Secondary | ICD-10-CM | POA: Diagnosis present

## 2015-08-22 DIAGNOSIS — Z79899 Other long term (current) drug therapy: Secondary | ICD-10-CM | POA: Diagnosis not present

## 2015-08-22 DIAGNOSIS — R2689 Other abnormalities of gait and mobility: Secondary | ICD-10-CM | POA: Diagnosis present

## 2015-08-22 DIAGNOSIS — R202 Paresthesia of skin: Secondary | ICD-10-CM | POA: Diagnosis present

## 2015-08-22 DIAGNOSIS — M25562 Pain in left knee: Secondary | ICD-10-CM | POA: Diagnosis present

## 2015-08-22 HISTORY — PX: TOTAL KNEE ARTHROPLASTY: SHX125

## 2015-08-22 LAB — TYPE AND SCREEN
ABO/RH(D): O POS
ANTIBODY SCREEN: NEGATIVE

## 2015-08-22 LAB — ABO/RH: ABO/RH(D): O POS

## 2015-08-22 SURGERY — ARTHROPLASTY, KNEE, TOTAL
Anesthesia: Spinal | Site: Knee | Laterality: Left

## 2015-08-22 MED ORDER — MENTHOL 3 MG MT LOZG: 1.0000 | LOZENGE | OROMUCOSAL | Status: DC | PRN

## 2015-08-22 MED ORDER — HYDROMORPHONE HCL 1 MG/ML IJ SOLN
1.0000 mg | INTRAMUSCULAR | Status: DC | PRN
  Administered 2015-08-22: 1 mg via INTRAVENOUS
  Filled 2015-08-22: qty 1

## 2015-08-22 MED ORDER — METHOCARBAMOL 500 MG PO TABS
500.0000 mg | ORAL_TABLET | Freq: Four times a day (QID) | ORAL | Status: DC | PRN
  Administered 2015-08-23 – 2015-08-25 (×8): 500 mg via ORAL
  Filled 2015-08-22 (×8): qty 1

## 2015-08-22 MED ORDER — RIVAROXABAN 10 MG PO TABS
10.0000 mg | ORAL_TABLET | Freq: Every day | ORAL | Status: DC
  Administered 2015-08-23 – 2015-08-25 (×3): 10 mg via ORAL
  Filled 2015-08-22 (×4): qty 1

## 2015-08-22 MED ORDER — ACETAMINOPHEN 325 MG PO TABS
650.0000 mg | ORAL_TABLET | Freq: Four times a day (QID) | ORAL | Status: DC | PRN
  Administered 2015-08-23 – 2015-08-24 (×2): 650 mg via ORAL
  Filled 2015-08-22 (×2): qty 2

## 2015-08-22 MED ORDER — LOSARTAN POTASSIUM 50 MG PO TABS
100.0000 mg | ORAL_TABLET | Freq: Every day | ORAL | Status: DC
  Administered 2015-08-23 – 2015-08-25 (×3): 100 mg via ORAL
  Filled 2015-08-22 (×3): qty 2

## 2015-08-22 MED ORDER — DILTIAZEM HCL ER COATED BEADS 120 MG PO CP24
240.0000 mg | ORAL_CAPSULE | Freq: Every day | ORAL | Status: DC
  Administered 2015-08-23 – 2015-08-25 (×3): 240 mg via ORAL
  Filled 2015-08-22 (×3): qty 2

## 2015-08-22 MED ORDER — OXYCODONE-ACETAMINOPHEN 5-325 MG PO TABS: 2.0000 | ORAL_TABLET | ORAL | Status: DC | PRN

## 2015-08-22 MED ORDER — SODIUM CHLORIDE 0.9 % IR SOLN
Status: AC
  Filled 2015-08-22: qty 1

## 2015-08-22 MED ORDER — BUPIVACAINE HCL (PF) 0.5 % IJ SOLN
INTRAMUSCULAR | Status: DC | PRN
  Administered 2015-08-22: 3 mL via INTRATHECAL

## 2015-08-22 MED ORDER — BUPIVACAINE HCL (PF) 0.25 % IJ SOLN
INTRAMUSCULAR | Status: DC | PRN
  Administered 2015-08-22: 20 mL

## 2015-08-22 MED ORDER — FLEET ENEMA 7-19 GM/118ML RE ENEM: 1.0000 | ENEMA | Freq: Once | RECTAL | Status: DC | PRN

## 2015-08-22 MED ORDER — SODIUM CHLORIDE 0.9 % IR SOLN
Status: DC | PRN
  Administered 2015-08-22: 500 mL

## 2015-08-22 MED ORDER — CHLORHEXIDINE GLUCONATE 4 % EX LIQD: 60.0000 mL | Freq: Once | CUTANEOUS | Status: DC

## 2015-08-22 MED ORDER — BUPIVACAINE LIPOSOME 1.3 % IJ SUSP
INTRAMUSCULAR | Status: DC | PRN
  Administered 2015-08-22: 20 mL

## 2015-08-22 MED ORDER — HYDROCODONE-ACETAMINOPHEN 5-325 MG PO TABS
1.0000 | ORAL_TABLET | ORAL | Status: DC | PRN
  Administered 2015-08-22: 2 via ORAL
  Filled 2015-08-22: qty 2

## 2015-08-22 MED ORDER — ALUM & MAG HYDROXIDE-SIMETH 200-200-20 MG/5ML PO SUSP
30.0000 mL | ORAL | Status: DC | PRN
  Administered 2015-08-23: 30 mL via ORAL
  Filled 2015-08-22: qty 30

## 2015-08-22 MED ORDER — BUPIVACAINE HCL (PF) 0.5 % IJ SOLN
INTRAMUSCULAR | Status: AC
  Filled 2015-08-22: qty 30

## 2015-08-22 MED ORDER — LACTATED RINGERS IV SOLN
INTRAVENOUS | Status: DC
  Administered 2015-08-22: 14:00:00 via INTRAVENOUS
  Administered 2015-08-22: 1000 mL via INTRAVENOUS
  Administered 2015-08-22: 15:00:00 via INTRAVENOUS

## 2015-08-22 MED ORDER — THROMBIN 5000 UNITS EX SOLR
CUTANEOUS | Status: AC
  Filled 2015-08-22: qty 5000

## 2015-08-22 MED ORDER — PROPOFOL 500 MG/50ML IV EMUL
INTRAVENOUS | Status: DC | PRN
  Administered 2015-08-22: 75 ug/kg/min via INTRAVENOUS
  Administered 2015-08-22: 50 ug/kg/min via INTRAVENOUS

## 2015-08-22 MED ORDER — FERROUS SULFATE 325 (65 FE) MG PO TABS
325.0000 mg | ORAL_TABLET | Freq: Three times a day (TID) | ORAL | Status: DC
  Filled 2015-08-22 (×11): qty 1

## 2015-08-22 MED ORDER — PHENOL 1.4 % MT LIQD: 1.0000 | OROMUCOSAL | Status: DC | PRN

## 2015-08-22 MED ORDER — CEFAZOLIN SODIUM 1-5 GM-% IV SOLN
1.0000 g | Freq: Four times a day (QID) | INTRAVENOUS | Status: AC
  Administered 2015-08-22 – 2015-08-23 (×2): 1 g via INTRAVENOUS
  Filled 2015-08-22 (×2): qty 50

## 2015-08-22 MED ORDER — SODIUM CHLORIDE 0.9 % IJ SOLN
INTRAMUSCULAR | Status: DC | PRN
  Administered 2015-08-22: 20 mL

## 2015-08-22 MED ORDER — CARBIDOPA-LEVODOPA 25-100 MG PO TABS
1.0000 | ORAL_TABLET | Freq: Three times a day (TID) | ORAL | Status: DC
  Administered 2015-08-22: 1 via ORAL
  Filled 2015-08-22: qty 1

## 2015-08-22 MED ORDER — PROPOFOL 10 MG/ML IV BOLUS
INTRAVENOUS | Status: AC
  Filled 2015-08-22: qty 20

## 2015-08-22 MED ORDER — OXYCODONE HCL 5 MG PO TABS: 5.0000 mg | ORAL_TABLET | Freq: Once | ORAL | Status: DC | PRN

## 2015-08-22 MED ORDER — ONDANSETRON HCL 4 MG/2ML IJ SOLN
4.0000 mg | Freq: Four times a day (QID) | INTRAMUSCULAR | Status: DC | PRN
  Administered 2015-08-23 – 2015-08-25 (×3): 4 mg via INTRAVENOUS
  Filled 2015-08-22 (×3): qty 2

## 2015-08-22 MED ORDER — MIDAZOLAM HCL 5 MG/5ML IJ SOLN
INTRAMUSCULAR | Status: DC | PRN
  Administered 2015-08-22: 2 mg via INTRAVENOUS

## 2015-08-22 MED ORDER — LACTATED RINGERS IV SOLN
INTRAVENOUS | Status: DC
Start: 2015-08-22 — End: 2015-08-25
  Administered 2015-08-22 (×2): via INTRAVENOUS

## 2015-08-22 MED ORDER — FENTANYL CITRATE (PF) 100 MCG/2ML IJ SOLN
INTRAMUSCULAR | Status: DC | PRN
  Administered 2015-08-22: 100 ug via INTRAVENOUS

## 2015-08-22 MED ORDER — OXYCODONE HCL 5 MG/5ML PO SOLN: 5.0000 mg | Freq: Once | ORAL | Status: DC | PRN

## 2015-08-22 MED ORDER — BISACODYL 5 MG PO TBEC: 5.0000 mg | DELAYED_RELEASE_TABLET | Freq: Every day | ORAL | Status: DC | PRN

## 2015-08-22 MED ORDER — HYDROMORPHONE HCL 2 MG/ML IJ SOLN
2.0000 mg | INTRAMUSCULAR | Status: DC | PRN
  Administered 2015-08-22: 2 mg via INTRAVENOUS
  Filled 2015-08-22: qty 1

## 2015-08-22 MED ORDER — PROPOFOL 10 MG/ML IV BOLUS
INTRAVENOUS | Status: AC
Start: 2015-08-22 — End: 2015-08-22
  Filled 2015-08-22: qty 20

## 2015-08-22 MED ORDER — CARBIDOPA-LEVODOPA 25-100 MG PO TABS
1.0000 | ORAL_TABLET | Freq: Four times a day (QID) | ORAL | Status: DC
  Administered 2015-08-22 – 2015-08-25 (×12): 1 via ORAL
  Filled 2015-08-22 (×14): qty 1

## 2015-08-22 MED ORDER — CARBIDOPA-LEVODOPA ER 25-100 MG PO TBCR
1.0000 | EXTENDED_RELEASE_TABLET | Freq: Every day | ORAL | Status: DC
  Administered 2015-08-22 – 2015-08-24 (×3): 1 via ORAL
  Filled 2015-08-22 (×4): qty 1

## 2015-08-22 MED ORDER — CEFAZOLIN SODIUM-DEXTROSE 2-3 GM-% IV SOLR
INTRAVENOUS | Status: AC
  Filled 2015-08-22: qty 50

## 2015-08-22 MED ORDER — POLYETHYLENE GLYCOL 3350 17 G PO PACK: 17.0000 g | PACK | Freq: Every day | ORAL | Status: DC | PRN

## 2015-08-22 MED ORDER — TRANEXAMIC ACID 1000 MG/10ML IV SOLN
1000.0000 mg | INTRAVENOUS | Status: AC
  Administered 2015-08-22: 1000 mg via INTRAVENOUS
  Filled 2015-08-22: qty 10

## 2015-08-22 MED ORDER — SODIUM CHLORIDE 0.9 % IJ SOLN
INTRAMUSCULAR | Status: AC
  Filled 2015-08-22: qty 20

## 2015-08-22 MED ORDER — ONDANSETRON HCL 4 MG/2ML IJ SOLN
INTRAMUSCULAR | Status: AC
  Filled 2015-08-22: qty 2

## 2015-08-22 MED ORDER — METHOCARBAMOL 1000 MG/10ML IJ SOLN
500.0000 mg | Freq: Four times a day (QID) | INTRAMUSCULAR | Status: DC | PRN
  Administered 2015-08-22: 500 mg via INTRAVENOUS
  Filled 2015-08-22 (×2): qty 5

## 2015-08-22 MED ORDER — EPHEDRINE SULFATE 50 MG/ML IJ SOLN
INTRAMUSCULAR | Status: DC | PRN
  Administered 2015-08-22: 5 mg via INTRAVENOUS

## 2015-08-22 MED ORDER — FENTANYL CITRATE (PF) 100 MCG/2ML IJ SOLN
INTRAMUSCULAR | Status: AC
  Filled 2015-08-22: qty 4

## 2015-08-22 MED ORDER — DEXAMETHASONE SODIUM PHOSPHATE 10 MG/ML IJ SOLN
INTRAMUSCULAR | Status: AC
  Filled 2015-08-22: qty 1

## 2015-08-22 MED ORDER — ONDANSETRON HCL 4 MG PO TABS: 4.0000 mg | ORAL_TABLET | Freq: Four times a day (QID) | ORAL | Status: DC | PRN

## 2015-08-22 MED ORDER — ACETAMINOPHEN 650 MG RE SUPP: 650.0000 mg | Freq: Four times a day (QID) | RECTAL | Status: DC | PRN

## 2015-08-22 MED ORDER — PANTOPRAZOLE SODIUM 40 MG PO TBEC
40.0000 mg | DELAYED_RELEASE_TABLET | Freq: Every day | ORAL | Status: DC
  Administered 2015-08-22 – 2015-08-25 (×4): 40 mg via ORAL
  Filled 2015-08-22 (×5): qty 1

## 2015-08-22 MED ORDER — HYDROMORPHONE HCL 1 MG/ML IJ SOLN: 0.2500 mg | INTRAMUSCULAR | Status: DC | PRN

## 2015-08-22 MED ORDER — BUPIVACAINE HCL (PF) 0.25 % IJ SOLN
INTRAMUSCULAR | Status: AC
  Filled 2015-08-22: qty 30

## 2015-08-22 MED ORDER — BUPIVACAINE LIPOSOME 1.3 % IJ SUSP
20.0000 mL | Freq: Once | INTRAMUSCULAR | Status: DC
  Filled 2015-08-22: qty 20

## 2015-08-22 MED ORDER — POTASSIUM CHLORIDE CRYS ER 20 MEQ PO TBCR
20.0000 meq | EXTENDED_RELEASE_TABLET | Freq: Every day | ORAL | Status: DC
  Administered 2015-08-25: 20 meq via ORAL
  Filled 2015-08-22 (×3): qty 1

## 2015-08-22 MED ORDER — OXYCODONE HCL 5 MG PO TABS
5.0000 mg | ORAL_TABLET | ORAL | Status: DC | PRN
  Administered 2015-08-22: 10 mg via ORAL
  Administered 2015-08-23: 5 mg via ORAL
  Administered 2015-08-23 (×2): 15 mg via ORAL
  Administered 2015-08-23: 5 mg via ORAL
  Administered 2015-08-23: 15 mg via ORAL
  Administered 2015-08-23: 10 mg via ORAL
  Administered 2015-08-24 – 2015-08-25 (×8): 5 mg via ORAL
  Filled 2015-08-22 (×7): qty 1
  Filled 2015-08-22 (×2): qty 3
  Filled 2015-08-22: qty 1
  Filled 2015-08-22: qty 2
  Filled 2015-08-22: qty 3
  Filled 2015-08-22 (×2): qty 1
  Filled 2015-08-22: qty 3

## 2015-08-22 MED ORDER — MIDAZOLAM HCL 2 MG/2ML IJ SOLN
INTRAMUSCULAR | Status: AC
  Filled 2015-08-22: qty 4

## 2015-08-22 MED ORDER — CEFAZOLIN SODIUM-DEXTROSE 2-3 GM-% IV SOLR
2.0000 g | INTRAVENOUS | Status: AC
  Administered 2015-08-22: 2 g via INTRAVENOUS

## 2015-08-22 MED ORDER — ONDANSETRON HCL 4 MG/2ML IJ SOLN: 4.0000 mg | Freq: Once | INTRAMUSCULAR | Status: DC | PRN

## 2015-08-22 SURGICAL SUPPLY — 74 items
BAG DECANTER FOR FLEXI CONT (MISCELLANEOUS) IMPLANT
BAG SPEC THK2 15X12 ZIP CLS (MISCELLANEOUS)
BAG ZIPLOCK 12X15 (MISCELLANEOUS) IMPLANT
BANDAGE ELASTIC 4 VELCRO ST LF (GAUZE/BANDAGES/DRESSINGS) ×2 IMPLANT
BANDAGE ELASTIC 6 VELCRO ST LF (GAUZE/BANDAGES/DRESSINGS) ×2 IMPLANT
BANDAGE ESMARK 6X9 LF (GAUZE/BANDAGES/DRESSINGS) ×1 IMPLANT
BLADE SAG 18X100X1.27 (BLADE) ×2 IMPLANT
BLADE SAW SGTL 11.0X1.19X90.0M (BLADE) ×2 IMPLANT
BNDG CMPR 9X6 STRL LF SNTH (GAUZE/BANDAGES/DRESSINGS) ×1
BNDG ESMARK 6X9 LF (GAUZE/BANDAGES/DRESSINGS) ×2
BONE CEMENT GENTAMICIN (Cement) ×4 IMPLANT
CAP KNEE TOTAL 3 SIGMA ×1 IMPLANT
CEMENT BONE GENTAMICIN 40 (Cement) ×2 IMPLANT
CUFF TOURN SGL QUICK 34 (TOURNIQUET CUFF) ×2
CUFF TRNQT CYL 34X4X40X1 (TOURNIQUET CUFF) ×1 IMPLANT
DECANTER SPIKE VIAL GLASS SM (MISCELLANEOUS) ×1 IMPLANT
DRAPE EXTREMITY T 121X128X90 (DRAPE) ×2 IMPLANT
DRAPE INCISE IOBAN 66X45 STRL (DRAPES) IMPLANT
DRAPE POUCH INSTRU U-SHP 10X18 (DRAPES) ×2 IMPLANT
DRAPE SHEET LG 3/4 BI-LAMINATE (DRAPES) ×2 IMPLANT
DRAPE U-SHAPE 47X51 STRL (DRAPES) ×2 IMPLANT
DRSG AQUACEL AG ADV 3.5X10 (GAUZE/BANDAGES/DRESSINGS) ×2 IMPLANT
DRSG TEGADERM 4X4.75 (GAUZE/BANDAGES/DRESSINGS) ×2 IMPLANT
DURAPREP 26ML APPLICATOR (WOUND CARE) ×2 IMPLANT
ELECT REM PT RETURN 9FT ADLT (ELECTROSURGICAL) ×2
ELECTRODE REM PT RTRN 9FT ADLT (ELECTROSURGICAL) ×1 IMPLANT
EVACUATOR 1/8 PVC DRAIN (DRAIN) ×2 IMPLANT
FACESHIELD WRAPAROUND (MASK) ×10 IMPLANT
FACESHIELD WRAPAROUND OR TEAM (MASK) ×5 IMPLANT
GAUZE SPONGE 2X2 8PLY STRL LF (GAUZE/BANDAGES/DRESSINGS) ×1 IMPLANT
GLOVE BIOGEL PI IND STRL 6.5 (GLOVE) ×1 IMPLANT
GLOVE BIOGEL PI IND STRL 8 (GLOVE) ×1 IMPLANT
GLOVE BIOGEL PI INDICATOR 6.5 (GLOVE) ×1
GLOVE BIOGEL PI INDICATOR 8 (GLOVE) ×1
GLOVE ECLIPSE 8.0 STRL XLNG CF (GLOVE) ×4 IMPLANT
GLOVE SURG SS PI 6.5 STRL IVOR (GLOVE) ×2 IMPLANT
GOWN STRL REUS W/TWL LRG LVL3 (GOWN DISPOSABLE) ×2 IMPLANT
GOWN STRL REUS W/TWL XL LVL3 (GOWN DISPOSABLE) ×2 IMPLANT
HANDPIECE INTERPULSE COAX TIP (DISPOSABLE) ×2
IMMOBILIZER KNEE 20 (SOFTGOODS) ×3 IMPLANT
IMMOBILIZER KNEE 20 THIGH 36 (SOFTGOODS) ×1 IMPLANT
KIT BASIN OR (CUSTOM PROCEDURE TRAY) ×2 IMPLANT
LIQUID BAND (GAUZE/BANDAGES/DRESSINGS) ×2 IMPLANT
MANIFOLD NEPTUNE II (INSTRUMENTS) ×2 IMPLANT
NDL SAFETY ECLIPSE 18X1.5 (NEEDLE) ×2 IMPLANT
NEEDLE HYPO 18GX1.5 SHARP (NEEDLE) ×4
NEEDLE HYPO 22GX1.5 SAFETY (NEEDLE) ×2 IMPLANT
NS IRRIG 1000ML POUR BTL (IV SOLUTION) IMPLANT
PACK TOTAL JOINT (CUSTOM PROCEDURE TRAY) ×2 IMPLANT
PEN SKIN MARKING BROAD (MISCELLANEOUS) ×2 IMPLANT
POSITIONER SURGICAL ARM (MISCELLANEOUS) ×2 IMPLANT
SET HNDPC FAN SPRY TIP SCT (DISPOSABLE) ×1 IMPLANT
SET PAD KNEE POSITIONER (MISCELLANEOUS) ×2 IMPLANT
SPONGE GAUZE 2X2 STER 10/PKG (GAUZE/BANDAGES/DRESSINGS) ×1
SPONGE LAP 18X18 X RAY DECT (DISPOSABLE) IMPLANT
SPONGE SURGIFOAM ABS GEL 100 (HEMOSTASIS) ×2 IMPLANT
SUCTION FRAZIER 12FR DISP (SUCTIONS) ×2 IMPLANT
SUT BONE WAX W31G (SUTURE) ×2 IMPLANT
SUT MNCRL AB 4-0 PS2 18 (SUTURE) ×2 IMPLANT
SUT VIC AB 1 CT1 27 (SUTURE) ×4
SUT VIC AB 1 CT1 27XBRD ANTBC (SUTURE) ×2 IMPLANT
SUT VIC AB 2-0 CT1 27 (SUTURE) ×8
SUT VIC AB 2-0 CT1 TAPERPNT 27 (SUTURE) ×3 IMPLANT
SUT VLOC 180 0 24IN GS25 (SUTURE) ×2 IMPLANT
SYR 20CC LL (SYRINGE) ×4 IMPLANT
SYR 50ML LL SCALE MARK (SYRINGE) ×2 IMPLANT
TOWEL OR 17X26 10 PK STRL BLUE (TOWEL DISPOSABLE) ×2 IMPLANT
TOWEL OR NON WOVEN STRL DISP B (DISPOSABLE) IMPLANT
TOWER CARTRIDGE SMART MIX (DISPOSABLE) ×2 IMPLANT
TRAY FOLEY W/METER SILVER 14FR (SET/KITS/TRAYS/PACK) ×2 IMPLANT
TRAY FOLEY W/METER SILVER 16FR (SET/KITS/TRAYS/PACK) ×2 IMPLANT
WATER STERILE IRR 1500ML POUR (IV SOLUTION) ×2 IMPLANT
WRAP KNEE MAXI GEL POST OP (GAUZE/BANDAGES/DRESSINGS) ×2 IMPLANT
YANKAUER SUCT BULB TIP 10FT TU (MISCELLANEOUS) ×2 IMPLANT

## 2015-08-22 NOTE — Anesthesia Postprocedure Evaluation (Signed)
  Anesthesia Post-op Note  Patient: Jacqueline Atkinson  Procedure(s) Performed: Procedure(s) (LRB): LEFT TOTAL  KNEE  ARTHROPLASTY (Left)  Patient Location: PACU  Anesthesia Type: Spinal  Level of Consciousness: awake and alert   Airway and Oxygen Therapy: Patient Spontanous Breathing  Post-op Pain: mild  Post-op Assessment: Post-op Vital signs reviewed, Patient's Cardiovascular Status Stable, Respiratory Function Stable, Patent Airway and No signs of Nausea or vomiting  Last Vitals:  Filed Vitals:   08/22/15 1515  BP: 111/65  Pulse: 64  Temp: 36.5 C  Resp: 16    Post-op Vital Signs: stable   Complications: No apparent anesthesia complications

## 2015-08-22 NOTE — Anesthesia Procedure Notes (Signed)
Epidural Patient location during procedure: OR  Staffing Anesthesiologist: Perseus Westall Performed by: anesthesiologist   Preanesthetic Checklist Completed: patient identified, site marked, surgical consent, pre-op evaluation, timeout performed, IV checked, risks and benefits discussed and monitors and equipment checked  Epidural Patient position: sitting Prep: ChloraPrep Patient monitoring: heart rate, continuous pulse ox and blood pressure Location: L3-L4 Injection technique: LOR saline  Needle:  Needle type: Hustead  Needle gauge: 18 G Needle length: 9 cm and 9 Needle insertion depth: 5.5 cm Catheter type: closed end flexible Catheter size: 20 Guage Catheter at skin depth: 10 cm  Additional Notes CSE was performed with 39ml isobaric bupivicaine. LOR 5.5/C10cm. Procedure was smooth, single attempt. No parasthesia, CSF freely aspirated.   Patient tolerated the insertion well without complications.Reason for block:surgical anesthesia

## 2015-08-22 NOTE — Brief Op Note (Signed)
08/22/2015  2:40 PM  PATIENT:  Jacqueline Atkinson  59 y.o. female  PRE-OPERATIVE DIAGNOSIS:Primary  OSTEOARTHRITIS LEFT KNEE   POST-OPERATIVE DIAGNOSIS:Primary  OSTEOARTHRITIS LEFT KNEE   PROCEDURE:  Procedure(s): LEFT TOTAL  KNEE  ARTHROPLASTY (Left)  SURGEON:  Surgeon(s) and Role:    * Latanya Maudlin, MD - Primary  PHYSICIAN ASSISTANT:Amber New Carlisle PA   ASSISTANTS:Amber Cecilio Asper PA}   ANESTHESIA:   spinal  EBL:  Total I/O In: 1000 [I.V.:1000] Out: 100 [Blood:100]  BLOOD ADMINISTERED:none  DRAINS: (One) Hemovact drain(s) in the Left Knee with  Suction Open   LOCAL MEDICATIONS USED:  MARCAINE 20cc of 0.25% Plain and 20cc of Exparel mixed with 20cc of Normal Saline     SPECIMEN:  No Specimen  DISPOSITION OF SPECIMEN:  N/A  COUNTS:  YES  TOURNIQUET:  * Missing tourniquet times found for documented tourniquets in log:  [id number] *  DICTATION: .Other Dictation: Dictation Number [id number]  PLAN OF CARE: Admit to inpatient   PATIENT DISPOSITION:  Stable in OR   Delay start of Pharmacological VTE agent (>24hrs) due to surgical blood loss or risk of bleeding: yes

## 2015-08-22 NOTE — Interval H&P Note (Signed)
History and Physical Interval Note:  08/22/2015 12:38 PM  Jacqueline Atkinson  has presented today for surgery, with the diagnosis of OSTEOARTHRITIS LEFT KNEE   The various methods of treatment have been discussed with the patient and family. After consideration of risks, benefits and other options for treatment, the patient has consented to  Procedure(s): TOTAL LEFT KNEE ARTHROPLASTY (Left) as a surgical intervention .  The patient's history has been reviewed, patient examined, no change in status, stable for surgery.  I have reviewed the patient's chart and labs.  Questions were answered to the patient's satisfaction.     Anzley Dibbern A

## 2015-08-22 NOTE — Transfer of Care (Signed)
Immediate Anesthesia Transfer of Care Note  Patient: Jacqueline Atkinson  Procedure(s) Performed: Procedure(s): LEFT TOTAL  KNEE  ARTHROPLASTY (Left)  Patient Location: PACU  Anesthesia Type:Spinal  Level of Consciousness: awake, alert , oriented, patient cooperative and responds to stimulation  Airway & Oxygen Therapy: Patient Spontanous Breathing and Patient connected to face mask oxygen  Post-op Assessment: Report given to RN and Post -op Vital signs reviewed and stable  Post vital signs: Reviewed and stable  Last Vitals:  Filed Vitals:   08/22/15 0921  BP: 139/77  Pulse: 79  Temp: 36.4 C  Resp: 18    Complications: No apparent anesthesia complications

## 2015-08-23 ENCOUNTER — Encounter (HOSPITAL_COMMUNITY): Payer: Self-pay | Admitting: Orthopedic Surgery

## 2015-08-23 LAB — CBC
HCT: 32.9 % — ABNORMAL LOW (ref 36.0–46.0)
Hemoglobin: 10.4 g/dL — ABNORMAL LOW (ref 12.0–15.0)
MCH: 25.9 pg — AB (ref 26.0–34.0)
MCHC: 31.6 g/dL (ref 30.0–36.0)
MCV: 82 fL (ref 78.0–100.0)
PLATELETS: 312 10*3/uL (ref 150–400)
RBC: 4.01 MIL/uL (ref 3.87–5.11)
RDW: 15.2 % (ref 11.5–15.5)
WBC: 15.3 10*3/uL — ABNORMAL HIGH (ref 4.0–10.5)

## 2015-08-23 LAB — BASIC METABOLIC PANEL
Anion gap: 9 (ref 5–15)
BUN: 10 mg/dL (ref 6–20)
CHLORIDE: 102 mmol/L (ref 101–111)
CO2: 26 mmol/L (ref 22–32)
CREATININE: 0.73 mg/dL (ref 0.44–1.00)
Calcium: 9.2 mg/dL (ref 8.9–10.3)
GFR calc Af Amer: >60 mL/min (ref >60)
GFR calc non Af Amer: >60 mL/min (ref >60)
GLUCOSE: 185 mg/dL — AB (ref 65–99)
POTASSIUM: 4.2 mmol/L (ref 3.5–5.1)
Sodium: 137 mmol/L (ref 135–145)

## 2015-08-23 MED ORDER — METOCLOPRAMIDE HCL 5 MG/ML IJ SOLN
10.0000 mg | Freq: Four times a day (QID) | INTRAMUSCULAR | Status: DC | PRN
  Administered 2015-08-23: 10 mg via INTRAVENOUS
  Filled 2015-08-23: qty 2

## 2015-08-23 MED ORDER — MECLIZINE HCL 12.5 MG PO TABS
12.5000 mg | ORAL_TABLET | Freq: Two times a day (BID) | ORAL | Status: DC | PRN
  Administered 2015-08-23 – 2015-08-25 (×3): 25 mg via ORAL
  Filled 2015-08-23 (×6): qty 2

## 2015-08-23 NOTE — Care Management Note (Signed)
Case Management Note  Patient Details  Name: Jacqueline Atkinson MRN: [id number] Date of Birth: 07-08-1956  Subjective/Objective:                   LEFT TOTAL KNEE ARTHROPLASTY (Left) Action/Plan: Discharge planning  Expected Discharge Date:  08/23/15               Expected Discharge Plan:  [place]  In-House Referral:     Discharge planning Services  CM Consult  Post Acute Care Choice:  Home Health Choice offered to:  Patient  DME Arranged:  3-N-1, Walker rolling DME Agency:  [place]:  PT [place] Agency:  [place]  Status of Service:  Completed, signed off  Medicare Important Message Given:    Date Medicare IM Given:    Medicare IM give by:    Date Additional Medicare IM Given:    Additional Medicare Important Message give by:     If discussed at [address] of Stay Meetings, dates discussed:    Additional Comments: CM met with pt in room to offer choice of home health agency.  Pt chooses [facility] to render HHPT.  Address and contact information verified by pt.  Referral emailed to [employer], Tim.  CM called [facility] DME rep, Lecretia to please deliver the rolling walker and 3n1 to room prior to discharge.  No other CM needs were communicated. Dellie Catholic, RN 08/23/2015, 10:07 AM

## 2015-08-23 NOTE — Progress Notes (Signed)
Physical Therapy Treatment Patient Details Name: Jacqueline Atkinson MRN: [id number] DOB: Apr 21, 1956 Today's Date: 08/30/2015    History of Present Illness L TKR; pt with hs ov multi-system atrophy disease    PT Comments    Pt continues motivated but extremely ltd by nausea and dizziness.  BP this session 162/82  Follow Up Recommendations  Home health PT     Equipment Recommendations  Rolling walker with 5" wheels    Recommendations for Other Services OT consult     Precautions / Restrictions Precautions Precautions: Knee;Fall Precaution Comments: Hx of falls at home Restrictions Weight Bearing Restrictions: No LLE Weight Bearing: Weight bearing as tolerated    Mobility  Bed Mobility Overal bed mobility: Needs Assistance Bed Mobility: Sit to Supine       Sit to supine: Mod assist;+2 for physical assistance;+2 for safety/equipment   General bed mobility comments: cues for sequence and use of R LE to self assist  Transfers Overall transfer level: Needs assistance Equipment used: Rolling walker (2 wheeled) Transfers: Sit to/from Stand Sit to Stand: Mod assist;+2 physical assistance;+2 safety/equipment Stand pivot transfers: Mod assist;+2 safety/equipment       General transfer comment: cues for LE management and use of UEs to self assist  Ambulation/Gait             General Gait Details: Pt tolerated stand pvt chair to [facility] only 2* increasing nausea/dizziness.  Pt stood from [facility] and bed pulled in behind   Stairs            Wheelchair Mobility    Modified Rankin (Stroke Patients Only)       Balance                                    Cognition Arousal/Alertness: Awake/alert Behavior During Therapy: WFL for tasks assessed/performed Overall Cognitive Status: Within Functional Limits for tasks assessed                      Exercises      General Comments        Pertinent Vitals/Pain Pain Assessment: 0-10 Pain Score:  6  Pain Location: L knee Pain Descriptors / Indicators: Aching;Sore Pain Intervention(s): Limited activity within patient's tolerance;Monitored during session;Premedicated before session;Ice applied    Home Living                      Prior Function            PT Goals (current goals can now be found in the care plan section) Acute Rehab PT Goals Patient Stated Goal: Walk without pain PT Goal Formulation: With patient Time For Goal Achievement: 08/29/15 Potential to Achieve Goals: Good Progress towards PT goals: Progressing toward goals    Frequency  7X/week    PT Plan Current plan remains appropriate    Co-evaluation             End of Session Equipment Utilized During Treatment: Gait belt;Left knee immobilizer Activity Tolerance: Patient limited by fatigue;Patient limited by pain;Other (comment) (nausea and dizziness ) Patient left: in bed;with call bell/phone within reach     Time: 1427-1506 PT Time Calculation (min) (ACUTE ONLY): 39 min  Charges:  $Therapeutic Activity: 8-22 mins                    G Codes:      Devynn Scheff August 30, 2015,  3:16 PM

## 2015-08-23 NOTE — Evaluation (Addendum)
Occupational Therapy Evaluation Patient Details Name: Jacqueline Atkinson MRN: [id number] DOB: 1956/09/29 Today's Date: 08/23/2015    History of Present Illness L TKR; pt with h/omulti-system atrophy disease   Clinical Impression   This 59 year old female was admitted for the above. She will benefit from skilled OT to increase safety and independence with adls.  Goals in acute are for min guard assist overall.  She currently needs mod to max +2 assistance for transfers and LB adls and she was limited during evaluation by dizziness and nausea.      Follow Up Recommendations  Home health OT;Supervision/Assistance - 24 hour    Equipment Recommendations  3 in 1 bedside comode (delivered per pt)    Recommendations for Other Services       Precautions / Restrictions Precautions Precautions: Knee;Fall Precaution Comments: Hx of falls at home Restrictions Weight Bearing Restrictions: No LLE Weight Bearing: Weight bearing as tolerated      Mobility Bed Mobility Overal bed mobility: Needs Assistance Bed Mobility: Sit to Supine       Sit to supine: Mod assist;+2 for physical assistance;+2 for safety/equipment   General bed mobility comments: cues for sequence and use of R LE to self assist  Transfers Overall transfer level: Needs assistance Equipment used: Rolling walker (2 wheeled) Transfers: Sit to/from Stand Sit to Stand: Mod assist;+2 physical assistance;+2 safety/equipment Stand pivot transfers: Mod assist;+2 safety/equipment       General transfer comment: cues for sequencing SPT; cues for UE/LE placement for sit to stand    Balance                                            ADL Overall ADL's : Needs assistance/impaired     Grooming: Wash/dry hands;Wash/dry face;Set up;Sitting   Upper Body Bathing: Set up;Sitting   Lower Body Bathing: Maximal assistance;Sit to/from stand;+2 for physical assistance   Upper Body Dressing : Sitting   Lower Body  Dressing: Maximal assistance;+2 for physical assistance;Sit to/from stand   Toilet Transfer: Moderate assistance;+2 for physical assistance;+2 for safety/equipment;Stand-pivot;BSC;RW   Toileting- Clothing Manipulation and Hygiene: Supervision/safety;Sitting/lateral lean         General ADL Comments: During transfer to 3:1, commode had to be brought up to her due to increased dizziness and nausea.  BP 162/82.  Had third person assist to bring bed close when done using commode.  Pt will have several children assisting her at home.  Did not introduce AE     Vision     Perception     Praxis      Pertinent Vitals/Pain Pain Assessment: 0-10 Pain Score: 6  Pain Location: L knee Pain Descriptors / Indicators: Aching Pain Intervention(s): Limited activity within patient's tolerance;Monitored during session;Premedicated before session;Repositioned;Ice applied     Hand Dominance     Extremity/Trunk Assessment Upper Extremity Assessment Upper Extremity Assessment: Overall WFL for tasks assessed           Communication Communication Communication: No difficulties   Cognition Arousal/Alertness: Awake/alert Behavior During Therapy: WFL for tasks assessed/performed Overall Cognitive Status: Within Functional Limits for tasks assessed                     General Comments       Exercises       Shoulder Instructions      Home Living Family/patient expects to be  discharged to:: Private residence Living Arrangements: Children;Other relatives Available Help at Discharge: Family               Bathroom Shower/Tub: Walk-in shower   Bathroom Toilet: Handicapped height     Home Equipment: Shower seat;Bedside commode;Grab bars - toilet;Grab bars - tub/shower   Additional Comments: per pt 3;1 was delivered. small threshold for shower with fold down seat      Prior Functioning/Environment Level of Independence: Independent;Independent with assistive device(s)              OT Diagnosis: Generalized weakness;Acute pain   OT Problem List: Decreased strength;Decreased activity tolerance;Cardiopulmonary status limiting activity;Decreased knowledge of use of DME or AE;Pain   OT Treatment/Interventions: Self-care/ADL training;DME and/or AE instruction;Patient/family education    OT Goals(Current goals can be found in the care plan section) Acute Rehab OT Goals Patient Stated Goal: decreased dizziness/nausea; walk without pain OT Goal Formulation: With patient Time For Goal Achievement: 08/30/15 Potential to Achieve Goals: Good ADL Goals Pt Will Perform Grooming: with min guard assist;standing Pt Will Transfer to Toilet: with min assist;ambulating;bedside commode Pt Will Perform Toileting - Clothing Manipulation and hygiene: with min guard assist;sit to/from stand Pt Will Perform Tub/Shower Transfer: Shower transfer;with min guard assist;ambulating;shower seat (or verbalize sequence) Additional ADL Goal #1: pt will verbalize vs demonstrate use of AE for ADLs  OT Frequency: Min 2X/week   Barriers to D/C:            Co-evaluation PT/OT/SLP Co-Evaluation/Treatment: Yes Reason for Co-Treatment: For patient/therapist safety PT goals addressed during session: Mobility/safety with mobility OT goals addressed during session: ADL's and self-care      End of Session Nurse Communication:  (anti nausea medication)  Activity Tolerance: Treatment limited secondary to medical complications (Comment) (dizziness/nausea) Patient left: in bed;with call bell/phone within reach   Time: 1437-1500 OT Time Calculation (min): 23 min Charges:  OT General Charges $OT Visit: 1 Procedure OT Evaluation $Initial OT Evaluation Tier I: 1 Procedure G-Codes:    Dalena Plantz 28-Aug-2015, 3:36 PM Lesle Chris, OTR/L [phone] Aug 28, 2015

## 2015-08-23 NOTE — Discharge Instructions (Addendum)
INSTRUCTIONS AFTER JOINT REPLACEMENT  ° °o Remove items at home which could result in a fall. This includes throw rugs or furniture in walking pathways °o ICE to the affected joint every three hours while awake for 30 minutes at a time, for at least the first 3-5 days, and then as needed for pain and swelling.  Continue to use ice for pain and swelling. You may notice swelling that will progress down to the foot and ankle.  This is normal after surgery.  Elevate your leg when you are not up walking on it.   °o Continue to use the breathing machine you got in the hospital (incentive spirometer) which will help keep your temperature down.  It is common for your temperature to cycle up and down following surgery, especially at night when you are not up moving around and exerting yourself.  The breathing machine keeps your lungs expanded and your temperature down. ° ° °DIET:  As you were doing prior to hospitalization, we recommend a well-balanced diet. ° °DRESSING / WOUND CARE / SHOWERING ° °Keep the surgical dressing until follow up.  The dressing is water proof, so you can shower without any extra covering.  IF THE DRESSING FALLS OFF or the wound gets wet inside, change the dressing with sterile gauze.  Please use good hand washing techniques before changing the dressing.  Do not use any lotions or creams on the incision until instructed by your surgeon.   ° °ACTIVITY ° °o Increase activity slowly as tolerated, but follow the weight bearing instructions below.   °o No driving for 6 weeks or until further direction given by your physician.  You cannot drive while taking narcotics.  °o No lifting or carrying greater than 10 lbs. until further directed by your surgeon. °o Avoid periods of inactivity such as sitting longer than an hour when not asleep. This helps prevent blood clots.  °o You may return to work once you are authorized by your doctor.  ° ° ° °WEIGHT BEARING  ° °Weight bearing as tolerated with assist  device (walker, cane, etc) as directed, use it as long as suggested by your surgeon or therapist, typically at least 4-6 weeks. ° ° °EXERCISES ° °Results after joint replacement surgery are often greatly improved when you follow the exercise, range of motion and muscle strengthening exercises prescribed by your doctor. Safety measures are also important to protect the joint from further injury. Any time any of these exercises cause you to have increased pain or swelling, decrease what you are doing until you are comfortable again and then slowly increase them. If you have problems or questions, call your caregiver or physical therapist for advice.  ° °Rehabilitation is important following a joint replacement. After just a few days of immobilization, the muscles of the leg can become weakened and shrink (atrophy).  These exercises are designed to build up the tone and strength of the thigh and leg muscles and to improve motion. Often times heat used for twenty to thirty minutes before working out will loosen up your tissues and help with improving the range of motion but do not use heat for the first two weeks following surgery (sometimes heat can increase post-operative swelling).  ° °These exercises can be done on a training (exercise) mat, on the floor, on a table or on a bed. Use whatever works the best and is most comfortable for you.    Use music or television while you are exercising so that   the exercises are a pleasant break in your day. This will make your life better with the exercises acting as a break in your routine that you can look forward to.   Perform all exercises about fifteen times, three times per day or as directed.  You should exercise both the operative leg and the other leg as well. ° °Exercises include: °  °• Quad Sets - Tighten up the muscle on the front of the thigh (Quad) and hold for 5-10 seconds.   °• Straight Leg Raises - With your knee straight (if you were given a brace, keep it on),  lift the leg to 60 degrees, hold for 3 seconds, and slowly lower the leg.  Perform this exercise against resistance later as your leg gets stronger.  °• Leg Slides: Lying on your back, slowly slide your foot toward your buttocks, bending your knee up off the floor (only go as far as is comfortable). Then slowly slide your foot back down until your leg is flat on the floor again.  °• Angel Wings: Lying on your back spread your legs to the side as far apart as you can without causing discomfort.  °• Hamstring Strength:  Lying on your back, push your heel against the floor with your leg straight by tightening up the muscles of your buttocks.  Repeat, but this time bend your knee to a comfortable angle, and push your heel against the floor.  You may put a pillow under the heel to make it more comfortable if necessary.  ° °A rehabilitation program following joint replacement surgery can speed recovery and prevent re-injury in the future due to weakened muscles. Contact your doctor or a physical therapist for more information on knee rehabilitation.  ° ° °CONSTIPATION ° °Constipation is defined medically as fewer than three stools per week and severe constipation as less than one stool per week.  Even if you have a regular bowel pattern at home, your normal regimen is likely to be disrupted due to multiple reasons following surgery.  Combination of anesthesia, postoperative narcotics, change in appetite and fluid intake all can affect your bowels.  ° °YOU MUST use at least one of the following options; they are listed in order of increasing strength to get the job done.  They are all available over the counter, and you may need to use some, POSSIBLY even all of these options:   ° °Drink plenty of fluids (prune juice may be helpful) and high fiber foods °Colace 100 mg by mouth twice a day  °Senokot for constipation as directed and as needed Dulcolax (bisacodyl), take with full glass of water  °Miralax (polyethylene glycol)  once or twice a day as needed. ° °If you have tried all these things and are unable to have a bowel movement in the first 3-4 days after surgery call either your surgeon or your primary doctor.   ° °If you experience loose stools or diarrhea, hold the medications until you stool forms back up.  If your symptoms do not get better within 1 week or if they get worse, check with your doctor.  If you experience "the worst abdominal pain ever" or develop nausea or vomiting, please contact the office immediately for further recommendations for treatment. ° ° °ITCHING:  If you experience itching with your medications, try taking only a single pain pill, or even half a pain pill at a time.  You can also use Benadryl over the counter for itching or also to   help with sleep.   TED HOSE STOCKINGS:  Use stockings on both legs until for at least 2 weeks or as directed by physician office. They may be removed at night for sleeping.  MEDICATIONS:  See your medication summary on the After Visit Summary that nursing will review with you.  You may have some home medications which will be placed on hold until you complete the course of blood thinner medication.  It is important for you to complete the blood thinner medication as prescribed.  PRECAUTIONS:  If you experience chest pain or shortness of breath - call 911 immediately for transfer to the hospital emergency department.   If you develop a fever greater that 101 F, purulent drainage from wound, increased redness or drainage from wound, foul odor from the wound/dressing, or calf pain - CONTACT YOUR SURGEON.                                                   FOLLOW-UP APPOINTMENTS:  If you do not already have a post-op appointment, please call the office for an appointment to be seen by your surgeon.  Guidelines for how soon to be seen are listed in your After Visit Summary, but are typically between 1-4 weeks after surgery.  OTHER INSTRUCTIONS:   Do not take  vitamins or supplements while taking your blood thinner (xarelto)  MAKE SURE YOU:   Understand these instructions.   Get help right away if you are not doing well or get worse.    Thank you for letting us be a part of your medical care team.  It is a privilege we respect greatly.  We hope these instructions will help you stay on track for a fast and full recovery!    Information on my medicine - XARELTO (Rivaroxaban)  This medication education was reviewed with me or my healthcare representative as part of my discharge preparation.  The pharmacist that spoke with me during my hospital stay was:  Clovis Riley, [facility]  Why was Xarelto prescribed for you? Xarelto was prescribed for you to reduce the risk of blood clots forming after orthopedic surgery. The medical term for these abnormal blood clots is venous thromboembolism (VTE).  What do you need to know about xarelto ? Take your Xarelto ONCE DAILY at the same time every day. You may take it either with or without food.  If you have difficulty swallowing the tablet whole, you may crush it and mix in applesauce just prior to taking your dose.  Take Xarelto exactly as prescribed by your doctor and DO NOT stop taking Xarelto without talking to the doctor who prescribed the medication.  Stopping without other VTE prevention medication to take the place of Xarelto may increase your risk of developing a clot.  After discharge, you should have regular check-up appointments with your healthcare provider that is prescribing your Xarelto.    What do you do if you miss a dose? If you miss a dose, take it as soon as you remember on the same day then continue your regularly scheduled once daily regimen the next day. Do not take two doses of Xarelto on the same day.   Important Safety Information A possible side effect of Xarelto is bleeding. You should call your healthcare provider right away if you experience any of the  following: ?  Bleeding from an injury or your nose that does not stop. ? Unusual colored urine (red or dark brown) or unusual colored stools (red or black). ? Unusual bruising for unknown reasons. ? A serious fall or if you hit your head (even if there is no bleeding).  Some medicines may interact with Xarelto and might increase your risk of bleeding while on Xarelto. To help avoid this, consult your healthcare provider or pharmacist prior to using any new prescription or non-prescription medications, including herbals, vitamins, non-steroidal anti-inflammatory drugs (NSAIDs) and supplements.  This website has more information on Xarelto: [email or web address]

## 2015-08-23 NOTE — Op Note (Signed)
NAMEJULIAH, SCADDEN                ACCOUNT NO.:  [id number]  MEDICAL RECORD NO.:  [id number]  LOCATION:  [place]                         FACILITY:  [facility]  PHYSICIAN:  Kipp Brood. Jaylani Mcguinn, M.D.DATE OF BIRTH:  [birth year]  DATE OF PROCEDURE:  08/22/2015 DATE OF DISCHARGE:                              OPERATIVE REPORT   SURGEON:  Kipp Brood. Gladstone Lighter, M.D.  ASSISTANT:  Ardeen Jourdain, [place].  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  OPERATION:  Left total knee arthroplasty utilizing the DePuy system, all 3 components were cemented.  The sizes used were a size 3 left, posterior cruciate sacrificing femoral component.  Tibial tray was a size 3.  The insert was a size 3, 12.5 mm thickness polyethylene rotating platform.  The patella was a size 41 with 3 peg.  Gentamicin was used in the cement.  DESCRIPTION OF PROCEDURE:  Under spinal anesthesia, a routine orthopedic prep and draping in the left lower extremity was carried out.  At this time, the appropriate time-out was carried out.  I also marked the appropriate left knee in the holding area.  At this time, the patient had 2 g of IV Ancef.  The leg was exsanguinated with the Esmarch. Tourniquet was elevated to 325 mmHg.  The knee was flexed and placed in a DeMayo knee holder.  An anterior approach was carried out.  Two flaps were created.  At this particular time, a median parapatellar incision was made.  Patella was reflected laterally and I did medial and lateral meniscectomies and excised the anterior posterior cruciate ligaments. At this time, initial drill hole was made in the intercondylar notch. The guide canal finder was inserted up the femur after I removed that thoroughly water picked out the knee, and then the next jig was inserted.  We removed 11 mm thickness  off the distal femur.  The femur then was measured at that time to be a size 3, with an anterior posterior chamfering cuts with size 3 femur.  After the femur was prepared, we now prepared the tibia.  Initial drill hole was made in the tibial plateau.  We then inserted our canal finder, removed that and irrigated out the canal.  We then removed 4 mm thickness off the medial side of the knee as the guide and at this particular time, we then went down and after removed the appropriate amount of tibial plateau we inserted our canal, I mean our lamina spreaders removed the posterior spurs, following that we went ahead and then inserted our spacer block and felt that the 12.5 mm would be the best to use.  We had good stability.  Following that, we then cut our keel cut of the tibia.  Our notch cut out of the distal femur.  We inserted our trial components and went with a 12.5 mm thickness insert, which was very nice.  We had good flexion and extension, with medial and lateral stability.  We then did our resurfacing procedure on the patella.  Three drill holes were made on the patella  for a size 41 patella.  All trial components were removed.  We thoroughly water picked out the knee, dried the knee out, cemented all 3 components in simultaneously.  Gentamicin was used in the cement.  Once the cement was hardened, we removed the loose pieces of cement.  We then removed our trial insert, water picked out the knee, and then injected 20 mL of 0.5% Marcaine plain into the soft tissue structures in usual fashion.  We then inserted some thrombin-soaked Gelfoam posteriorly and then inserted our permanent polyethylene insert size 3, 12.5 mm thickness.  We reduced the knee and had excellent motion and excellent stability.  We then inserted a Hemovac drain closed the wound layers in usual fashion.          ______________________________ Kipp Brood Gladstone Lighter, M.D.     RAG/MEDQ  D:  08/22/2015  T:   08/23/2015  Job:  [id number]

## 2015-08-23 NOTE — Progress Notes (Signed)
Subjective: 1 Day Post-Op Procedure(s) (LRB): LEFT TOTAL  KNEE  ARTHROPLASTY (Left) Patient reports pain as moderate.   Patient seen in rounds for Dr. Gladstone Lighter. Patient is well, but has had some minor complaints of headaches and nausea. She reports that she got a little rest last night. No SOB or chest pain. No issues overnight other than pain control. Plan is to go Home after hospital stay.  Objective: Vital signs in last 24 hours: Temp:  [96.6 F (35.9 C)-98.5 F (36.9 C)] 98 F (36.7 C) (10/26 8413) Pulse Rate:  [58-89] 80 (10/26 0637) Resp:  [14-20] 18 (10/26 0637) BP: (111-175)/(57-93) 165/85 mmHg (10/26 0637) SpO2:  [95 %-100 %] 97 % (10/26 0637) Weight:  [97 kg (213 lb 13.5 oz)-97.523 kg (215 lb)] 97 kg (213 lb 13.5 oz) (10/25 1715)  Intake/Output from previous day:  Intake/Output Summary (Last 24 hours) at 08/23/15 0724 Last data filed at 08/23/15 2440  Gross per 24 hour  Intake 3875.01 ml  Output   2185 ml  Net 1690.01 ml     Labs:  Recent Labs  08/23/15 0421  HGB 10.4*    Recent Labs  08/23/15 0421  WBC 15.3*  RBC 4.01  HCT 32.9*  PLT 312    Recent Labs  08/23/15 0421  NA 137  K 4.2  CL 102  CO2 26  BUN 10  CREATININE 0.73  GLUCOSE 185*  CALCIUM 9.2    EXAM General - Patient is Alert and Oriented Extremity - Neurologically intact Intact pulses distally Dorsiflexion/Plantar flexion intact No cellulitis present Compartment soft Dressing - dressing C/D/I Motor Function - intact, moving foot and toes well on exam.  Hemovac pulled without difficulty.  Past Medical History  Diagnosis Date  . Allergy   . Hypertension   . Anxiety   . Hyperlipidemia   . Arthritis     Left knee  . 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 Heart murmur   . Hx of seasonal allergies     occ. use OTC Mucinex or Antihistamine.  Marland Kitchen GERD (gastroesophageal reflux disease)   . History of  hiatal hernia     small  . Anemia   . Neuromuscular disorder ([place])     "multi system atrophy disease"- "pain, bilateral foot numbness, left knee pain"- Dr. Carles Collet is following.  . Shoulder disorder     right shoulder "rigidity due to Multi system atrophy"- ROM not limited"just tires me"  . Edema extremities     lower extremity edema left greater than right.  . Mobility impaired     ambulates with walker, tendency to lose balance.    Assessment/Plan: 1 Day Post-Op Procedure(s) (LRB): LEFT TOTAL  KNEE  ARTHROPLASTY (Left) Active Problems:   History of total knee arthroplasty  Estimated body mass index is 32.52 kg/(m^2) as calculated from the following:   Height as of this encounter: 5\' 8"  (1.727 m).   Weight as of this encounter: 97 kg (213 lb 13.5 oz). Advance diet Up with therapy D/C IV fluids when tolerating POs well Plan for discharge tomorrow  DVT Prophylaxis - Xarelto Weight-Bearing as tolerated D/C O2 and Pulse OX and try on Room Air  Will continue oxycodone for pain control as changed by on call last night. Will monitor nausea. Zofran available. Will switch to Reglan if needed. Therapy today. Plan for DC home tomorrow with HHPT.   Ardeen Jourdain, PA-C Orthopaedic  Surgery 08/23/2015, 7:24 AM

## 2015-08-23 NOTE — Progress Notes (Signed)
OT Cancellation Note  Patient Details Name: Liana Camerer MRN: [id number] DOB: 1955-11-10   Cancelled Treatment:    Reason Eval/Treat Not Completed: Other (comment) Per PT, pt nauseous and requiring increased assist for mobility. Will check back later time.  Jules Schick  [phone] 08/23/2015, 9:42 AM

## 2015-08-23 NOTE — Progress Notes (Signed)
Utilization review completed.  

## 2015-08-23 NOTE — Evaluation (Signed)
Physical Therapy Evaluation Patient Details Name: Jacqueline Atkinson MRN: [id number] DOB: 08-11-1956 Today's Date: 08/23/2015   History of Present Illness  L TKR; pt with hs ov multi-system atrophy disease  Clinical Impression  Pt s/p L TKR presents with decreased L LE strength/ROM and post op pain limiting functional mobility.  Pt should progress to dc home with family assist and HHPT follow up.    Follow Up Recommendations Home health PT    Equipment Recommendations  Rolling walker with 5" wheels    Recommendations for Other Services OT consult     Precautions / Restrictions Precautions Precautions: Knee;Fall Precaution Comments: Hx of falls at home Restrictions Weight Bearing Restrictions: No LLE Weight Bearing: Weight bearing as tolerated      Mobility  Bed Mobility Overal bed mobility: Needs Assistance Bed Mobility: Supine to Sit     Supine to sit: Min assist;Mod assist     General bed mobility comments: cues for sequence and use of R LE to self assist  Transfers Overall transfer level: Needs assistance Equipment used: Rolling walker (2 wheeled) Transfers: Sit to/from Stand Sit to Stand: Mod assist;+2 physical assistance;+2 safety/equipment Stand pivot transfers: Mod assist;+2 safety/equipment       General transfer comment: cues for LE management and use of UEs to self assist  Ambulation/Gait Ambulation/Gait assistance: Mod assist;+2 physical assistance;+2 safety/equipment Ambulation Distance (Feet): 3 Feet Assistive device: Rolling walker (2 wheeled) Gait Pattern/deviations: Step-to pattern;Decreased step length - right;Decreased step length - left;Shuffle;Trunk flexed Gait velocity: decr   General Gait Details: cues for sequence, posture and position from RW.  Increased time; ltd by c/o nausea and dizziness - BP 172/85 - RN aware  Stairs            Wheelchair Mobility    Modified Rankin (Stroke Patients Only)       Balance                                             Pertinent Vitals/Pain Pain Assessment: 0-10 Pain Score: 5  Pain Location: L knee Pain Descriptors / Indicators: Aching;Sore Pain Intervention(s): Limited activity within patient's tolerance;Monitored during session;Premedicated before session;Ice applied    Home Living Family/patient expects to be discharged to:: Private residence Living Arrangements: Children;Other relatives Available Help at Discharge: Family Type of Home: House Home Access: Stairs to enter Entrance Stairs-Rails: Right Entrance Stairs-Number of Steps: 3 Home Layout: One level Home Equipment: [place] - 4 wheels;Cane - quad;Cane - single point      Prior Function Level of Independence: Independent;Independent with assistive device(s)               Hand Dominance   Dominant Hand: Right    Extremity/Trunk Assessment   Upper Extremity Assessment: Overall WFL for tasks assessed           Lower Extremity Assessment: LLE deficits/detail   LLE Deficits / Details: 2/5 quads with AAROM at knee -10 - 40     Communication   Communication: No difficulties  Cognition Arousal/Alertness: Awake/alert Behavior During Therapy: WFL for tasks assessed/performed Overall Cognitive Status: Within Functional Limits for tasks assessed                      General Comments      Exercises Total Joint Exercises Ankle Circles/Pumps: AROM;Both;10 reps;Supine Quad Sets: AROM;Both;5 reps;Supine Heel Slides: AAROM;5 reps;Supine;Left Straight  Leg Raises: AAROM;Left;5 reps;Supine      Assessment/Plan    PT Assessment Patient needs continued PT services  PT Diagnosis Difficulty walking   PT Problem List Decreased strength;Decreased range of motion;Decreased activity tolerance;Decreased mobility;Pain;Decreased knowledge of use of DME  PT Treatment Interventions DME instruction;Gait training;Stair training;Functional mobility training;Therapeutic  activities;Therapeutic exercise;Patient/family education   PT Goals (Current goals can be found in the Care Plan section) Acute Rehab PT Goals Patient Stated Goal: Walk without pain PT Goal Formulation: With patient Time For Goal Achievement: 08/29/15 Potential to Achieve Goals: Good    Frequency 7X/week   Barriers to discharge        Co-evaluation               End of Session Equipment Utilized During Treatment: Gait belt;Left knee immobilizer Activity Tolerance: Patient limited by fatigue;Patient limited by pain;Other (comment) (nausea) Patient left: in chair;with call bell/phone within reach Nurse Communication: Mobility status         Time: 0820-0905 PT Time Calculation (min) (ACUTE ONLY): 45 min   Charges:   PT Evaluation $Initial PT Evaluation Tier I: 1 Procedure PT Treatments $Gait Training: 8-22 mins $Therapeutic Exercise: 8-22 mins   PT G Codes:        Tearra Ouk 2015-08-30, 11:41 AM

## 2015-08-24 LAB — CBC
HCT: 29.1 % — ABNORMAL LOW (ref 36.0–46.0)
Hemoglobin: 9 g/dL — ABNORMAL LOW (ref 12.0–15.0)
MCH: 24.9 pg — AB (ref 26.0–34.0)
MCHC: 30.9 g/dL (ref 30.0–36.0)
MCV: 80.4 fL (ref 78.0–100.0)
PLATELETS: 288 10*3/uL (ref 150–400)
RBC: 3.62 MIL/uL — ABNORMAL LOW (ref 3.87–5.11)
RDW: 15.4 % (ref 11.5–15.5)
WBC: 14.2 10*3/uL — ABNORMAL HIGH (ref 4.0–10.5)

## 2015-08-24 LAB — BASIC METABOLIC PANEL
Anion gap: 9 (ref 5–15)
BUN: 12 mg/dL (ref 6–20)
CALCIUM: 8.9 mg/dL (ref 8.9–10.3)
CO2: 29 mmol/L (ref 22–32)
Chloride: 98 mmol/L — ABNORMAL LOW (ref 101–111)
Creatinine, Ser: 0.74 mg/dL (ref 0.44–1.00)
GFR calc Af Amer: >60 mL/min (ref >60)
GLUCOSE: 113 mg/dL — AB (ref 65–99)
Potassium: 4 mmol/L (ref 3.5–5.1)
Sodium: 136 mmol/L (ref 135–145)

## 2015-08-24 MED ORDER — ONDANSETRON HCL 4 MG PO TABS: 4.0000 mg | ORAL_TABLET | Freq: Four times a day (QID) | ORAL | Status: DC | PRN

## 2015-08-24 MED ORDER — METHOCARBAMOL 500 MG PO TABS: 500.0000 mg | ORAL_TABLET | Freq: Four times a day (QID) | ORAL | Status: DC | PRN

## 2015-08-24 MED ORDER — RIVAROXABAN 10 MG PO TABS: 10.0000 mg | ORAL_TABLET | Freq: Every day | ORAL | Status: DC

## 2015-08-24 MED ORDER — MECLIZINE HCL 12.5 MG PO TABS: 12.5000 mg | ORAL_TABLET | Freq: Two times a day (BID) | ORAL | Status: DC | PRN

## 2015-08-24 MED ORDER — OXYCODONE HCL 5 MG PO TABS: 5.0000 mg | ORAL_TABLET | ORAL | Status: DC | PRN

## 2015-08-24 NOTE — Progress Notes (Signed)
Physical Therapy Treatment Patient Details Name: Jacqueline Atkinson MRN: [id number] DOB: 1956-10-28 Today's Date: 08/24/2015    History of Present Illness L TKR; pt with hs ov multi-system atrophy disease    PT Comments    Pt motivated but progressing slowly 2* continues c/o dizziness.  Follow Up Recommendations  Home health PT     Equipment Recommendations  Rolling walker with 5" wheels    Recommendations for Other Services OT consult     Precautions / Restrictions Precautions Precautions: Knee;Fall Precaution Comments: Hx of falls at home.  Pt continues with dizziness but less severe than yesterday Restrictions Weight Bearing Restrictions: No LLE Weight Bearing: Weight bearing as tolerated    Mobility  Bed Mobility Overal bed mobility: Needs Assistance Bed Mobility: Sit to Supine       Sit to supine: Min assist;Mod assist   General bed mobility comments: cues for sequence and use of R LE to self assist  Transfers Overall transfer level: Needs assistance Equipment used: Rolling walker (2 wheeled) Transfers: Sit to/from Stand Sit to Stand: Min assist;Mod assist;+2 physical assistance;+2 safety/equipment Stand pivot transfers: Mod assist;+2 safety/equipment       General transfer comment: cues for sequencing, UE/LE placement.  Assist to weight shift forward several times  Ambulation/Gait Ambulation/Gait assistance: Min assist;Mod assist;+2 physical assistance Ambulation Distance (Feet): 4 Feet Assistive device: Rolling walker (2 wheeled) Gait Pattern/deviations: Step-to pattern;Decreased step length - right;Decreased step length - left;Shuffle;Decreased stance time - left Gait velocity: decr   General Gait Details: cues for posture, position from RW, sequence and increased UE WB   Stairs            Wheelchair Mobility    Modified Rankin (Stroke Patients Only)       Balance                                    Cognition  Arousal/Alertness: Awake/alert Behavior During Therapy: WFL for tasks assessed/performed Overall Cognitive Status: Within Functional Limits for tasks assessed                      Exercises Total Joint Exercises Ankle Circles/Pumps: AROM;Both;10 reps;Supine Quad Sets: AROM;10 reps;Supine;Both Heel Slides: AAROM;Supine;Left;10 reps Straight Leg Raises: AAROM;Left;Supine;10 reps Goniometric ROM: AAROM at L knee -10-  40    General Comments        Pertinent Vitals/Pain Pain Assessment: 0-10 Pain Score: 5  Pain Location: L knee Pain Descriptors / Indicators: Aching;Sore Pain Intervention(s): Limited activity within patient's tolerance;Monitored during session;Premedicated before session;Ice applied    Home Living                      Prior Function            PT Goals (current goals can now be found in the care plan section) Acute Rehab PT Goals Patient Stated Goal: decreased dizziness/nausea; walk without pain PT Goal Formulation: With patient Time For Goal Achievement: 08/29/15 Potential to Achieve Goals: Good Progress towards PT goals: Progressing toward goals    Frequency  7X/week    PT Plan Current plan remains appropriate    Co-evaluation             End of Session Equipment Utilized During Treatment: Gait belt;Left knee immobilizer Activity Tolerance: Other (comment);Patient limited by fatigue (dizziness) Patient left: in bed;with call bell/phone within reach     Time: [phone]  PT Time Calculation (min) (ACUTE ONLY): 35 min  Charges:  $Gait Training: 8-22 mins $Therapeutic Exercise: 8-22 mins                    G Codes:      Jacqueline Atkinson Sep 12, 2015, 1:08 PM

## 2015-08-24 NOTE — Progress Notes (Signed)
Physical Therapy Treatment Patient Details Name: Jacqueline Atkinson MRN: [id number] DOB: 08-02-1956 Today's Date: 08/24/2015    History of Present Illness L TKR; pt with hx of multi-system atrophy disease    PT Comments    Pt with increased activity tolerance with slowly resolving dizziness.  Pt continues to have significant difficulty with all mobility and is expressing concern regarding family's ability to provide assist.  Follow Up Recommendations  SNF     Equipment Recommendations  Rolling walker with 5" wheels    Recommendations for Other Services OT consult     Precautions / Restrictions Precautions Precautions: Knee;Fall Precaution Comments: Hx of falls at home.  Pt continues with dizziness but less severe than yesterday Restrictions Weight Bearing Restrictions: No LLE Weight Bearing: Weight bearing as tolerated    Mobility  Bed Mobility Overal bed mobility: Needs Assistance Bed Mobility: Supine to Sit;Sit to Supine     Supine to sit: Min assist;Mod assist Sit to supine: Min assist;Mod assist   General bed mobility comments: cues for sequence and use of R LE to self assist  Transfers Overall transfer level: Needs assistance Equipment used: Rolling walker (2 wheeled) Transfers: Sit to/from Stand Sit to Stand: Min assist;Mod assist;+2 safety/equipment Stand pivot transfers: Min assist;Mod assist;+2 safety/equipment       General transfer comment: cues for sequencing, UE/LE placement.  Assist to weight shift forward several times  Ambulation/Gait Ambulation/Gait assistance: Min assist;Mod assist;+2 safety/equipment Ambulation Distance (Feet): 38 Feet Assistive device: Rolling walker (2 wheeled) Gait Pattern/deviations: Step-to pattern;Decreased step length - right;Decreased step length - left;Shuffle;Trunk flexed Gait velocity: decr   General Gait Details: cues for posture, position from RW, sequence and increased UE WB   Stairs            Wheelchair  Mobility    Modified Rankin (Stroke Patients Only)       Balance                                    Cognition Arousal/Alertness: Awake/alert Behavior During Therapy: WFL for tasks assessed/performed Overall Cognitive Status: Within Functional Limits for tasks assessed                      Exercises      General Comments        Pertinent Vitals/Pain Pain Assessment: 0-10 Pain Score: 6  Pain Location: L knee Pain Descriptors / Indicators: Aching;Sore Pain Intervention(s): Limited activity within patient's tolerance;Monitored during session;Patient requesting pain meds-RN notified;Ice applied    Home Living                      Prior Function            PT Goals (current goals can now be found in the care plan section) Acute Rehab PT Goals Patient Stated Goal: decreased dizziness/nausea; walk without pain PT Goal Formulation: With patient Time For Goal Achievement: 08/29/15 Potential to Achieve Goals: Good Progress towards PT goals: Progressing toward goals    Frequency  7X/week    PT Plan Discharge plan needs to be updated    Co-evaluation             End of Session Equipment Utilized During Treatment: Gait belt;Left knee immobilizer Activity Tolerance: Patient limited by fatigue;Patient limited by pain;Other (comment) Patient left: in bed;with call bell/phone within reach     Time: 1538-1610 PT Time  Calculation (min) (ACUTE ONLY): 32 min  Charges:  $Gait Training: 8-22 mins $Therapeutic Activity: 8-22 mins                    G Codes:      Raneisha Bress 09/11/2015, 5:15 PM

## 2015-08-24 NOTE — Progress Notes (Signed)
Subjective: 2 Days Post-Op Procedure(s) (LRB): LEFT TOTAL  KNEE  ARTHROPLASTY (Left) Patient reports pain as moderate.   Patient seen in rounds for Dr. Gladstone Lighter. Patient is well, but has had some minor complaints of dizziness and nausea.She reports that she has been felling better since taking meclizine but was hesitant to get up without PT because she was worried she would fall. No SOB or chest pain.  Plan is to go Home after hospital stay.  Objective: Vital signs in last 24 hours: Temp:  [98.2 F (36.8 C)-99.4 F (37.4 C)] 99.4 F (37.4 C) (10/27 0522) Pulse Rate:  [83-106] 106 (10/27 0522) Resp:  [18] 18 (10/27 0522) BP: (133-167)/(68-87) 133/75 mmHg (10/27 0522) SpO2:  [91 %-95 %] 91 % (10/27 0522)  Intake/Output from previous day:  Intake/Output Summary (Last 24 hours) at 08/24/15 0723 Last data filed at 08/24/15 0150  Gross per 24 hour  Intake    300 ml  Output   1000 ml  Net   -700 ml     Labs:  Recent Labs  08/23/15 0421 08/24/15 0545  HGB 10.4* 9.0*    Recent Labs  08/23/15 0421 08/24/15 0545  WBC 15.3* 14.2*  RBC 4.01 3.62*  HCT 32.9* 29.1*  PLT 312 288    Recent Labs  08/23/15 0421 08/24/15 0545  NA 137 136  K 4.2 4.0  CL 102 98*  CO2 26 29  BUN 10 12  CREATININE 0.73 0.74  GLUCOSE 185* 113*  CALCIUM 9.2 8.9    EXAM General - Patient is Alert and Oriented Extremity - Neurologically intact Intact pulses distally Dorsiflexion/Plantar flexion intact No cellulitis present Compartment soft Dressing/Incision - clean, dry, no drainage Motor Function - intact, moving foot and toes well on exam.   Past Medical History  Diagnosis Date  . Allergy   . Hypertension   . Anxiety   . Hyperlipidemia   . Arthritis     Left knee  . 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 Heart murmur   . Hx of seasonal allergies     occ. use OTC Mucinex or Antihistamine.  Marland Kitchen GERD  (gastroesophageal reflux disease)   . History of hiatal hernia     small  . Anemia   . Neuromuscular disorder ([place])     "multi system atrophy disease"- "pain, bilateral foot numbness, left knee pain"- Dr. Carles Collet is following.  . Shoulder disorder     right shoulder "rigidity due to Multi system atrophy"- ROM not limited"just tires me"  . Edema extremities     lower extremity edema left greater than right.  . Mobility impaired     ambulates with walker, tendency to lose balance.    Assessment/Plan: 2 Days Post-Op Procedure(s) (LRB): LEFT TOTAL  KNEE  ARTHROPLASTY (Left) Active Problems:   History of total knee arthroplasty  Estimated body mass index is 32.52 kg/(m^2) as calculated from the following:   Height as of this encounter: 5\' 8"  (1.727 m).   Weight as of this encounter: 97 kg (213 lb 13.5 oz). Advance diet Up with therapy Discharge home with home health when ready  DVT Prophylaxis - Xarelto Weight-Bearing as tolerated  She seems to have improved since starting the meclizine. Will continue. Work with PT today. Possible DC this afternoon is she progresses with therapy but likely hold DC until tomorrow.   Ardeen Jourdain, PA-C Orthopaedic Surgery 08/24/2015, 7:23 AM

## 2015-08-24 NOTE — NC FL2 (Signed)
[place] LEVEL OF CARE SCREENING TOOL     IDENTIFICATION  Patient Name: Jacqueline Atkinson Birthdate: 20-Aug-1956 Sex: female Admission Date (Current Location): 08/22/2015  [facility] and [place] Number: Herbalist and Address:  [facility],  [place], [place]      Provider Number: [id number]  Attending Physician Name and Address:  Latanya Maudlin, MD  Relative Name and Phone Number:       Current Level of Care: Hospital Recommended Level of Care: [place] Prior Approval Number:    Date Approved/Denied:   PASRR Number: [id number]  Discharge Plan: SNF    Current Diagnoses: Patient Active Problem List   Diagnosis Date Noted  . 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 Gait difficulty 12/29/2013  . Back pain 12/29/2013  . Knee pain 12/19/2012  . Paresthesia 12/19/2012  . Hypertension 11/28/2012  . Polypharmacy 11/28/2012  . GERD (gastroesophageal reflux disease) 11/28/2012    Orientation ACTIVITIES/SOCIAL BLADDER RESPIRATION    Self, Time, Situation, Place  Active Continent Normal  BEHAVIORAL SYMPTOMS/MOOD NEUROLOGICAL BOWEL NUTRITION STATUS      Continent Diet (Regular Diet)  PHYSICIAN VISITS COMMUNICATION OF NEEDS Height & Weight Skin    Verbally 5\' 8"  (172.7 cm) 213 lbs. Surgical wounds          AMBULATORY STATUS RESPIRATION    Assist extensive Normal      Personal Care Assistance Level of Assistance  Bathing, Dressing, Feeding Bathing Assistance: Limited assistance (mod A to +2 assist) Feeding assistance: Independent Dressing Assistance: Limited assistance (mod A to +2 assist)      Functional Limitations Info  Sight, Hearing, Speech Sight Info: Impaired Hearing Info: Adequate Speech Info: Adequate       SPECIAL CARE FACTORS FREQUENCY  PT (By licensed PT), OT (By licensed OT)     PT Frequency: 5 x a week OT Frequency: 5 x a  week           Additional Factors Info  Code Status, Allergies Code Status Info: FULL code status Allergies Info: Sulfa Antibiotics           Current Medications (08/24/2015): Current Facility-Administered Medications  Medication Dose Route Frequency Provider Last Rate Last Dose  . acetaminophen (TYLENOL) tablet 650 mg  650 mg Oral Q6H PRN Latanya Maudlin, MD   650 mg at 08/24/15 1106   Or  . acetaminophen (TYLENOL) suppository 650 mg  650 mg Rectal Q6H PRN Latanya Maudlin, MD      . alum & mag hydroxide-simeth (MAALOX/MYLANTA) 200-200-20 MG/5ML suspension 30 mL  30 mL Oral Q4H PRN Latanya Maudlin, MD   30 mL at 08/23/15 1105  . bisacodyl (DULCOLAX) EC tablet 5 mg  5 mg Oral Daily PRN Latanya Maudlin, MD      . carbidopa-levodopa (SINEMET IR) 25-100 MG per tablet immediate release 1 tablet  1 tablet Oral QID Latanya Maudlin, MD   1 tablet at 08/24/15 1756  . Carbidopa-Levodopa ER (SINEMET CR) 25-100 MG tablet controlled release 1 tablet  1 tablet Oral QHS Latanya Maudlin, MD   1 tablet at 08/23/15 2015  . diltiazem (CARDIZEM CD) 24 hr capsule 240 mg  240 mg Oral Daily Latanya Maudlin, MD   240 mg at 08/24/15 1023  . ferrous sulfate tablet 325 mg  325 mg Oral TID PC Latanya Maudlin, MD   325 mg at 08/22/15 1844  . HYDROmorphone (DILAUDID) injection 2 mg  2 mg Intravenous Q2H PRN Merla Riches, PA-C   2 mg at 08/22/15 2131  . lactated ringers infusion   Intravenous Continuous Latanya Maudlin, MD 100 mL/hr at 08/22/15 2223    . losartan (COZAAR) tablet 100 mg  100 mg Oral Daily Latanya Maudlin, MD   100 mg at 08/24/15 1023  . meclizine (ANTIVERT) tablet 12.5-25 mg  12.5-25 mg Oral BID PRN Amber Constable, PA-C   25 mg at 08/24/15 0827  . menthol-cetylpyridinium (CEPACOL) lozenge 3 mg  1 lozenge Oral PRN Latanya Maudlin, MD       Or  . phenol (CHLORASEPTIC) mouth spray 1 spray  1 spray Mouth/Throat PRN Latanya Maudlin, MD      . methocarbamol (ROBAXIN) tablet 500 mg  500 mg Oral Q6H PRN Latanya Maudlin,  MD   500 mg at 08/24/15 1353   Or  . methocarbamol (ROBAXIN) 500 mg in dextrose 5 % 50 mL IVPB  500 mg Intravenous Q6H PRN Latanya Maudlin, MD   500 mg at 08/22/15 1851  . metoCLOPramide (REGLAN) injection 10 mg  10 mg Intravenous Q6H PRN Amber Constable, PA-C   10 mg at 08/23/15 1025  . ondansetron (ZOFRAN) tablet 4 mg  4 mg Oral Q6H PRN Latanya Maudlin, MD       Or  . ondansetron [facility]) injection 4 mg  4 mg Intravenous Q6H PRN Latanya Maudlin, MD   4 mg at 08/23/15 1512  . oxyCODONE (Oxy IR/ROXICODONE) immediate release tablet 5-15 mg  5-15 mg Oral Q3H PRN Merla Riches, PA-C   5 mg at 08/24/15 1606  . pantoprazole (PROTONIX) EC tablet 40 mg  40 mg Oral Daily Latanya Maudlin, MD   40 mg at 08/24/15 9326  . polyethylene glycol (MIRALAX / GLYCOLAX) packet 17 g  17 g Oral Daily PRN Latanya Maudlin, MD      . potassium chloride SA (K-DUR,KLOR-CON) CR tablet 20 mEq  20 mEq Oral Daily Latanya Maudlin, MD   20 mEq at 08/23/15 1105  . rivaroxaban (XARELTO) tablet 10 mg  10 mg Oral Q breakfast Latanya Maudlin, MD   10 mg at 08/24/15 7124  . sodium phosphate (FLEET) 7-19 GM/118ML enema 1 enema  1 enema Rectal Once PRN Latanya Maudlin, MD       Do not use this list as official medication orders. Please verify with discharge summary.  Discharge Medications:   Medication List    STOP taking these medications        b complex vitamins tablet     cholecalciferol 1000 UNITS tablet  Commonly known as:  VITAMIN D     clonazePAM 0.5 MG tablet  Commonly known as:  KLONOPIN     cyclobenzaprine 10 MG tablet  Commonly known as:  FLEXERIL     diclofenac sodium 1 % Gel  Commonly known as:  VOLTAREN     OMEGA 3 PO     potassium chloride SA 20 MEQ tablet  Commonly known as:  K-DUR,KLOR-CON     Red Yeast Rice 600 MG Tabs     triamcinolone 0.025 % ointment  Commonly known as:  KENALOG     vitamin B-12 1000 MCG tablet  Commonly known as:  CYANOCOBALAMIN     vitamin C 100 MG tablet      TAKE these  medications        acetaminophen 500 MG tablet  Commonly known as:  TYLENOL  Take 1,000 mg by mouth every 4 (four) hours as needed for moderate pain.  Carbidopa-Levodopa ER 25-100 MG tablet controlled release  Commonly known as:  SINEMET CR  Take 1 tablet by mouth at bedtime.     carbidopa-levodopa 25-100 MG tablet  Commonly known as:  SINEMET IR  Take 1 tablet 4 times a day     carbidopa-levodopa 50-200 MG tablet  Commonly known as:  SINEMET CR  Take 1 tablet by mouth at bedtime.     diltiazem 240 MG 24 hr capsule  Commonly known as:  CARTIA XT  Take 1 capsule (240 mg total) by mouth daily.     esomeprazole 40 MG capsule  Commonly known as:  NEXIUM  Take 40 mg by mouth daily at 12 noon.     ferrous sulfate 325 (65 FE) MG tablet  Take 325 mg by mouth daily with breakfast.     furosemide 40 MG tablet  Commonly known as:  LASIX  Take 1 tablet (40 mg total) by mouth daily.     losartan 100 MG tablet  Commonly known as:  COZAAR  Take 1 tablet (100 mg total) by mouth daily.     meclizine 12.5 MG tablet  Commonly known as:  ANTIVERT  Take 1-2 tablets (12.5-25 mg total) by mouth 2 (two) times daily as needed for dizziness or nausea.     methocarbamol 500 MG tablet  Commonly known as:  ROBAXIN  Take 1 tablet (500 mg total) by mouth every 6 (six) hours as needed for muscle spasms.     omeprazole 40 MG capsule  Commonly known as:  PRILOSEC  Take 40 mg by mouth daily.     ondansetron 4 MG tablet  Commonly known as:  ZOFRAN  Take 1 tablet (4 mg total) by mouth every 6 (six) hours as needed for nausea.     oxyCODONE 5 MG immediate release tablet  Commonly known as:  Oxy IR/ROXICODONE  Take 1-3 tablets (5-15 mg total) by mouth every 3 (three) hours as needed for moderate pain or severe pain.     rivaroxaban 10 MG Tabs tablet  Commonly known as:  XARELTO  Take 1 tablet (10 mg total) by mouth daily with breakfast.        Relevant Imaging Results:  Relevant Lab  Results:  Recent Labs    Additional Information SSN: [id number]  Dennison Mcdaid A, LCSW

## 2015-08-24 NOTE — Progress Notes (Signed)
Occupational Therapy Treatment Patient Details Name: Jacqueline Atkinson MRN: [id number] DOB: 07/30/1956 Today's Date: 08/24/2015    History of present illness L TKR; pt with h/o multi-system atrophy disease   OT comments  Pt tolerated session better but still needs mod A +2 for transfers (and chair brought closer) as well as LB adls.  Pt still had a sense of dysequilibrium, lightheadedness and tilting.  Horizontal nystagmus noted.  Follow Up Recommendations  SNF (vs HHOT with 24/7 depending upon progress)    Equipment Recommendations  3 in 1 bedside comode    Recommendations for Other Services      Precautions / Restrictions Precautions Precautions: Knee;Fall Precaution Comments: Hx of falls at home Restrictions LLE Weight Bearing: Weight bearing as tolerated       Mobility Bed Mobility           Sit to supine: Min assist;+2 for physical assistance;+2 for safety/equipment;HOB elevated   General bed mobility comments: assisted with RLE and pt used rail.  Utilized pad to scoot forward in bed (mod A +2 safety)  Transfers   Equipment used: Rolling walker (2 wheeled) Transfers: Sit to/from Stand Sit to Stand: Mod assist;+2 physical assistance;+2 safety/equipment Stand pivot transfers: Mod assist;+2 safety/equipment       General transfer comment: cues for sequencing, UE/LE placement.  Assist to weight shift forward several times    Balance                                   ADL                           Toilet Transfer: Moderate assistance;+2 for physical assistance;+2 for safety/equipment;Stand-pivot;RW (to recliner)             General ADL Comments: pt had just used bedpan.  Assisted with bathing from EOB and assisted into chair. Pt was able to use RUE, but movement was stiff and pt had tremor after activity. Pt continues to need mod +2 assistance for SPT, and I brought chair closer as pt had difficulty stepping backwards.  Pt describes a  sense of tilting and lightheadedness but tolerated transfer better than yesterday.  Horizontal nystagmus noted.  Tried compensatory strategy of focusing on stationery object.  Pt reports that this helped a little; she says closing her eyes helps more.  Pt noted to keep head stationary:  she is able to tolerate turning if she moves slowly.   Will continue to work on head movement; she could benefit from AE for RLE, but she is unable to lift L at this time; unable to tolerate crossing R under L at this time      Vision                     Perception     Praxis      Cognition   Behavior During Therapy: [facility] for tasks assessed/performed Overall Cognitive Status: Within Functional Limits for tasks assessed                       Extremity/Trunk Assessment  Upper Extremity Assessment Upper Extremity Assessment: RUE deficits/detail (stiff movement during adls; able to hold washcloth; tremor )            Exercises     Shoulder Instructions       General Comments  Pertinent Vitals/ Pain       Pain Score: 5  Pain Location: L knee Pain Descriptors / Indicators: Sore Pain Intervention(s): Limited activity within patient's tolerance;Monitored during session;Premedicated before session;Repositioned;Ice applied  Home Living                                          Prior Functioning/Environment              Frequency Min 2X/week     Progress Toward Goals  OT Goals(current goals can now be found in the care plan section)  Progress towards OT goals: Goals drowngraded-see care plan (progressing slowly)  ADL Goals Pt Will Perform Grooming: with min assist;standing Pt Will Transfer to Toilet: with min assist;with +2 assist;stand pivot transfer;bedside commode Pt Will Perform Toileting - Clothing Manipulation and hygiene:  (discontinue) Pt Will Perform Tub/Shower Transfer:  (discontinue) Additional ADL Goal #2: pt will stand statically with  min A for LB ADLs for 2 minutes  Plan      Co-evaluation                 End of Session     Activity Tolerance Patient limited by fatigue   Patient Left in chair;with call bell/phone within reach   Nurse Communication Mobility status        Time: 4010-2725 OT Time Calculation (min): 37 min  Charges: OT General Charges $OT Visit: 1 Procedure OT Treatments $Self Care/Home Management : 8-22 mins $Therapeutic Activity: 8-22 mins  Rolla Kedzierski 08/24/2015, 10:35 AM Lesle Chris, OTR/L [phone] 08/24/2015

## 2015-08-25 LAB — CBC
HEMATOCRIT: 27.7 % — AB (ref 36.0–46.0)
HEMOGLOBIN: 8.7 g/dL — AB (ref 12.0–15.0)
MCH: 25.9 pg — ABNORMAL LOW (ref 26.0–34.0)
MCHC: 31.4 g/dL (ref 30.0–36.0)
MCV: 82.4 fL (ref 78.0–100.0)
Platelets: 256 10*3/uL (ref 150–400)
RBC: 3.36 MIL/uL — AB (ref 3.87–5.11)
RDW: 15.7 % — ABNORMAL HIGH (ref 11.5–15.5)
WBC: 10.7 10*3/uL — AB (ref 4.0–10.5)

## 2015-08-25 NOTE — Progress Notes (Signed)
Physical Therapy Treatment Patient Details Name: Jacqueline Atkinson MRN: [id number] DOB: 1956-01-31 Today's Date: September 18, 2015    History of Present Illness L TKR; pt with hx of multi-system atrophy disease    PT Comments    Pt seen for therex.  OOB deferred, pt scheduled for transport to rehab at SNF level.  Follow Up Recommendations  SNF     Equipment Recommendations  Rolling walker with 5" wheels    Recommendations for Other Services OT consult     Precautions / Restrictions Precautions Precautions: Knee;Fall Precaution Comments: Hx of falls at home.  Pt continues with dizziness but less severe than yesterday Required Braces or Orthoses: Knee Immobilizer - Left Knee Immobilizer - Left: Discontinue once straight leg raise with < 10 degree lag Restrictions Weight Bearing Restrictions: No LLE Weight Bearing: Weight bearing as tolerated    Mobility  Bed Mobility                  Transfers                    Ambulation/Gait                 Stairs            Wheelchair Mobility    Modified Rankin (Stroke Patients Only)       Balance                                    Cognition Arousal/Alertness: Awake/alert Behavior During Therapy: WFL for tasks assessed/performed Overall Cognitive Status: Within Functional Limits for tasks assessed                      Exercises Total Joint Exercises Ankle Circles/Pumps: AROM;Both;Supine;20 reps Quad Sets: AROM;Supine;Both;20 reps Heel Slides: AAROM;Supine;Left;20 reps Straight Leg Raises: AAROM;Left;Supine;20 reps Goniometric ROM: AAROM L knee -10-  45    General Comments        Pertinent Vitals/Pain Pain Assessment: 0-10 Pain Score: 5  Pain Location: L knee and R quads Pain Descriptors / Indicators: Aching;Sore Pain Intervention(s): Limited activity within patient's tolerance;Monitored during session;Premedicated before session    Home Living                       Prior Function            PT Goals (current goals can now be found in the care plan section) Acute Rehab PT Goals Patient Stated Goal: decreased dizziness/nausea; walk without pain PT Goal Formulation: With patient Time For Goal Achievement: 08/29/15 Potential to Achieve Goals: Fair Progress towards PT goals: Progressing toward goals    Frequency  7X/week    PT Plan Current plan remains appropriate    Co-evaluation             End of Session   Activity Tolerance: Patient tolerated treatment well Patient left: in bed;with call bell/phone within reach     Time: 1352-1418 PT Time Calculation (min) (ACUTE ONLY): 26 min  Charges:  $Therapeutic Exercise: 23-37 mins                    G Codes:      Lavere Shinsky September 18, 2015, 2:35 PM

## 2015-08-25 NOTE — Clinical Social Work Note (Signed)
Clinical Social Work Assessment  Patient Details  Name: Jacqueline Atkinson MRN: [id number] Date of Birth: 1956-05-11  Date of referral:  08/25/15               Reason for consult:  Discharge Planning                Permission sought to share information with:  Family Supports Permission granted to share information::  Yes, Verbal Permission Granted  Name::        Agency::     Relationship::  pt son at bedside  Contact Information:     Housing/Transportation Living arrangements for the past 2 months:  Single Family Home Source of Information:  Patient Patient Interpreter Needed:  None Criminal Activity/Legal Involvement Pertinent to Current Situation/Hospitalization:  No - Comment as needed Significant Relationships:  Adult Children Lives with:  Self Do you feel safe going back to the place where you live?  No Need for family participation in patient care:  No (Coment)  Care giving concerns:  Pt admitted from home alone. Pt progressing slowly with therapies and recommendation has changed to SNF.    Social Worker assessment / plan:  CSW received referral for New SNF.   CSW met with pt at bedside. CSW introduced self and explained role. Pt reports that she was hopeful to return home, but pt not progressing as well as she had hoped and PT now recommending SNF. CSW discussed with pt process of SNF. CSW informed pt that there are times where [employer] can be difficult to obtain insurance authorization same day and explained that if [employer] auth cannot be obtained then pt would have to go to a facility that will accept a Letter of Guarantee (LOG) until insurance auth can be obtained. CSW explained that not all facilities will accept LOG. Pt expressed understanding and hopeful that [facility] will approve SNF today.   CSW initiated [facility] search and provided bed offers to pt. Pt chooses bed at [facility] and Rehab. CSW faxed clinicals to [facility] and [facility] as  well. [facility] stated that facility would accept a LOG if [employer] did not respond today.   CSW received notification from [facility] that facility received [employer] authorization for admission today and LOG will not be needed.  CSW updated pt at bedside and pt pleased that insurance approved short term rehab.  CSW facilitated pt discharge needs including contacting facility, sending pt discharge information, discussing with pt at bedside, providing RN phone number to call report, and arranging ambulance transport for pt to [facility] and Rehab.  No further social work needs identified at this time.  CSW signing off.   Employment status:  Unemployed Forensic scientist:  Managed Care PT Recommendations:  [place] / Referral to community resources:  [place]  Patient/Family's Response to care:  Pt alert and oriented x 4. Pt pleasant and actively involved in conversations. Pt eager for rehab in order to get stronger to return home.   Patient/Family's Understanding of and Emotional Response to Diagnosis, Current Treatment, and Prognosis:  Pt aware of discharge today to [facility] and Rehab.  Emotional Assessment Appearance:  Appears younger than stated age Attitude/Demeanor/Rapport:  Other (pt calm and cooperative) Affect (typically observed):  Appropriate, Accepting Orientation:  Oriented to Self, Oriented to Place, Oriented to  Time, Oriented to Situation Alcohol / Substance use:  Not Applicable  Psych involvement (Current and /or in the community):  No (Comment)  Discharge Needs  Concerns to be addressed:  Discharge Planning Concerns Readmission within the last 30 days:  No Current discharge risk:  Lives alone Barriers to Discharge:  [place], Wende Crease, [place] 08/25/2015, 2:21 PM  [phone]

## 2015-08-25 NOTE — Progress Notes (Signed)
Physical Therapy Treatment Patient Details Name: Jacqueline Atkinson MRN: [id number] DOB: Sep 18, 1956 Today's Date: 08/25/2015    History of Present Illness L TKR; pt with hx of multi-system atrophy disease    PT Comments    Pt continues motivated but frustrated with slow progress 2* dizziness/nausea with activity.  Follow Up Recommendations  SNF     Equipment Recommendations  Rolling walker with 5" wheels    Recommendations for Other Services OT consult     Precautions / Restrictions Precautions Precautions: Knee;Fall Precaution Comments: Hx of falls at home.  Pt continues with dizziness but less severe than yesterday Required Braces or Orthoses: Knee Immobilizer - Left Knee Immobilizer - Left: Discontinue once straight leg raise with < 10 degree lag Restrictions Weight Bearing Restrictions: No LLE Weight Bearing: Weight bearing as tolerated    Mobility  Bed Mobility Overal bed mobility: Needs Assistance Bed Mobility: Supine to Sit     Supine to sit: Min assist     General bed mobility comments: cues for sequence and use of R LE to self assist  Transfers Overall transfer level: Needs assistance Equipment used: Rolling walker (2 wheeled) Transfers: Sit to/from Stand Sit to Stand: Min assist;Mod assist;+2 safety/equipment         General transfer comment: cues for sequencing, UE/LE placement.  Assist to weight shift forward   Ambulation/Gait Ambulation/Gait assistance: Min assist;Mod assist;+2 safety/equipment Ambulation Distance (Feet): 18 Feet (and 3') Assistive device: Rolling walker (2 wheeled) Gait Pattern/deviations: Step-to pattern;Decreased step length - right;Decreased step length - left;Shuffle;Trunk flexed Gait velocity: decr   General Gait Details: cues for posture, position from RW, sequence and increased UE WB.  Pt ltd by increasing dizziness and onset of nausea   Stairs            Wheelchair Mobility    Modified Rankin (Stroke Patients  Only)       Balance                                    Cognition Arousal/Alertness: Awake/alert Behavior During Therapy: WFL for tasks assessed/performed Overall Cognitive Status: Within Functional Limits for tasks assessed                      Exercises      General Comments        Pertinent Vitals/Pain Pain Assessment: 0-10 Pain Score: 4  Pain Location: L knee Pain Descriptors / Indicators: Aching;Sore Pain Intervention(s): Limited activity within patient's tolerance;Monitored during session;Premedicated before session;Ice applied    Home Living                      Prior Function            PT Goals (current goals can now be found in the care plan section) Acute Rehab PT Goals Patient Stated Goal: decreased dizziness/nausea; walk without pain PT Goal Formulation: With patient Time For Goal Achievement: 08/29/15 Potential to Achieve Goals: Fair Progress towards PT goals: Progressing toward goals    Frequency  7X/week    PT Plan Discharge plan needs to be updated    Co-evaluation             End of Session Equipment Utilized During Treatment: Gait belt;Left knee immobilizer Activity Tolerance: Other (comment);Patient limited by fatigue (dizziness, nausea) Patient left: in chair;with call bell/phone within reach;with family/visitor present     Time:  7096-4383 PT Time Calculation (min) (ACUTE ONLY): 19 min  Charges:  $Gait Training: 8-22 mins                    G Codes:      Magalie Almon September 08, 2015, 10:15 AM

## 2015-08-25 NOTE — Progress Notes (Signed)
Subjective: 3 Days Post-Op Procedure(s) (LRB): LEFT TOTAL  KNEE  ARTHROPLASTY (Left) Patient reports pain as 3 on 0-10 scale.States she cannot go home and needs SNF. HBg 8.7 but stable.    Objective: Vital signs in last 24 hours: Temp:  [97.8 F (36.6 C)-99.7 F (37.6 C)] 99.7 F (37.6 C) (10/28 0549) Pulse Rate:  [94-99] 95 (10/28 0549) Resp:  [18-20] 18 (10/28 0549) BP: (126-135)/(68-74) 135/72 mmHg (10/28 0549) SpO2:  [95 %-98 %] 95 % (10/28 0549)  Intake/Output from previous day: 10/27 0701 - 10/28 0700 In: 1560 [P.O.:1560] Out: 1600 [Urine:1600] Intake/Output this shift:     Recent Labs  08/23/15 0421 08/24/15 0545 08/25/15 0618  HGB 10.4* 9.0* 8.7*    Recent Labs  08/24/15 0545 08/25/15 0618  WBC 14.2* 10.7*  RBC 3.62* 3.36*  HCT 29.1* 27.7*  PLT 288 256    Recent Labs  08/23/15 0421 08/24/15 0545  NA 137 136  K 4.2 4.0  CL 102 98*  CO2 26 29  BUN 10 12  CREATININE 0.73 0.74  GLUCOSE 185* 113*  CALCIUM 9.2 8.9   No results for input(s): LABPT, INR in the last 72 hours.  Dorsiflexion/Plantar flexion intact Compartment soft  Assessment/Plan: 3 Days Post-Op Procedure(s) (LRB): LEFT TOTAL  KNEE  ARTHROPLASTY (Left) Up with therapy Discharge to SNF  [place] Gustafson A 08/25/2015, 7:07 AM

## 2015-08-25 NOTE — Clinical Social Work Placement (Signed)
   CLINICAL SOCIAL WORK PLACEMENT  NOTE  Date:  08/25/2015  Patient Details  Name: Jacqueline Atkinson MRN: [id number] Date of Birth: 11/04/1955  Clinical Social Work is seeking post-discharge placement for this patient at the [place] level of care (*CSW will initial, date and re-position this form in  chart as items are completed):  Yes   Patient/family provided with [facility]'s list of facilities offering this level of care within the geographic area requested by the patient (or if unable, by the patient's family).  Yes   Patient/family informed of their freedom to choose among providers that offer the needed level of care, that participate in Medicare, Medicaid or managed care program needed by the patient, have an available bed and are willing to accept the patient.  Yes   Patient/family informed of [facility]'s ownership interest in [facility] and [facility], as well as of the fact that they are under no obligation to receive care at these facilities.  PASRR submitted to [employer]DS on 08/25/15     PASRR number received on 08/25/15     Existing PASRR number confirmed on       FL2 transmitted to all facilities in geographic area requested by pt/family on 08/25/15     FL2 transmitted to all facilities within larger geographic area on       Patient informed that his/her managed care company has contracts with or will negotiate with certain facilities, including the following:        Yes   Patient/family informed of bed offers received.  Patient chooses bed at [facility]     Physician recommends and patient chooses bed at      Patient to be transferred to [facility] on 08/25/15.  Patient to be transferred to facility by ambulance [facility])     Patient family notified on 08/25/15 of transfer.  Name of family member notified:  pt notified at bedside     PHYSICIAN Please sign FL2     Additional  Comment:    _______________________________________________ Ladell Pier, LCSW 08/25/2015, 1:02 PM

## 2015-08-25 NOTE — Discharge Summary (Signed)
Physician Discharge Summary   Patient ID: Jacqueline Atkinson MRN: [id number] DOB/AGE: 59/22/1957 59 y.o.  Admit date: 08/22/2015 Discharge date: 08/25/2015  Primary Diagnosis: Primary osteoarthritis left knee   Admission Diagnoses:  Past Medical History  Diagnosis Date  . Allergy   . Hypertension   . Anxiety   . Hyperlipidemia   . Arthritis     Left knee  . 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 Heart murmur   . Hx of seasonal allergies     occ. use OTC Mucinex or Antihistamine.  Marland Kitchen GERD (gastroesophageal reflux disease)   . History of hiatal hernia     small  . Anemia   . Neuromuscular disorder ([place])     "multi system atrophy disease"- "pain, bilateral foot numbness, left knee pain"- Dr. Carles Collet is following.  . Shoulder disorder     right shoulder "rigidity due to Multi system atrophy"- ROM not limited"just tires me"  . Edema extremities     lower extremity edema left greater than right.  . Mobility impaired     ambulates with walker, tendency to lose balance.   Discharge Diagnoses:   Active Problems:   History of total knee arthroplasty  Estimated body mass index is 32.52 kg/(m^2) as calculated from the following:   Height as of this encounter: '5\' 8"'  (1.727 m).   Weight as of this encounter: 97 kg (213 lb 13.5 oz).  Procedure:  Procedure(s) (LRB): LEFT TOTAL  KNEE  ARTHROPLASTY (Left)   Consults: None  HPI: Jacqueline Atkinson, 59 y.o. female, has a history of pain and functional disability in the left knee due to arthritis and has failed non-surgical conservative treatments for greater than 12 weeks to includeNSAID's and/or analgesics, corticosteriod injections and activity modification. Onset of symptoms was gradual, starting 4 years ago with gradually worsening course since that time. The patient noted prior procedures on the knee to include arthroscopy and menisectomy on the left knee(s). Patient  currently rates pain in the left knee(s) at 7 out of 10 with activity. Patient has night pain, worsening of pain with activity and weight bearing, pain that interferes with activities of daily living, pain with passive range of motion, crepitus and joint swelling. Patient has evidence of periarticular osteophytes and joint space narrowing by imaging studies. There is no active infection.  Laboratory Data: Admission on 08/22/2015  Component Date Value Ref Range Status  . ABO/RH(D) 08/22/2015 O POS   Final  . Antibody Screen 08/22/2015 NEG   Final  . Sample Expiration 08/22/2015 08/25/2015   Final  . ABO/RH(D) 08/22/2015 O POS   Final  . WBC 08/23/2015 15.3* 4.0 - 10.5 K/uL Final  . RBC 08/23/2015 4.01  3.87 - 5.11 MIL/uL Final  . Hemoglobin 08/23/2015 10.4* 12.0 - 15.0 g/dL Final  . HCT 08/23/2015 32.9* 36.0 - 46.0 % Final  . MCV 08/23/2015 82.0  78.0 - 100.0 fL Final  . MCH 08/23/2015 25.9* 26.0 - 34.0 pg Final  . MCHC 08/23/2015 31.6  30.0 - 36.0 g/dL Final  . RDW 08/23/2015 15.2  11.5 - 15.5 % Final  . Platelets 08/23/2015 312  150 - 400 K/uL Final  . Sodium 08/23/2015 137  135 - 145 mmol/L Final  . Potassium 08/23/2015 4.2  3.5 - 5.1 mmol/L Final  . Chloride 08/23/2015 102  101 - 111 mmol/L Final  . CO2 08/23/2015 26  22 - 32 mmol/L Final  .  Glucose, Bld 08/23/2015 185* 65 - 99 mg/dL Final  . BUN 08/23/2015 10  6 - 20 mg/dL Final  . Creatinine, Ser 08/23/2015 0.73  0.44 - 1.00 mg/dL Final  . Calcium 08/23/2015 9.2  8.9 - 10.3 mg/dL Final  . GFR calc non Af Amer 08/23/2015 >60  >60 mL/min Final  . GFR calc Af Amer 08/23/2015 >60  >60 mL/min Final   Comment: (NOTE) The eGFR has been calculated using the CKD EPI equation. This calculation has not been validated in all clinical situations. eGFR's persistently <60 mL/min signify possible Chronic Kidney Disease.   . Anion gap 08/23/2015 9  5 - 15 Final  . WBC 08/24/2015 14.2* 4.0 - 10.5 K/uL Final  . RBC 08/24/2015 3.62* 3.87 -  5.11 MIL/uL Final  . Hemoglobin 08/24/2015 9.0* 12.0 - 15.0 g/dL Final  . HCT 08/24/2015 29.1* 36.0 - 46.0 % Final  . MCV 08/24/2015 80.4  78.0 - 100.0 fL Final  . MCH 08/24/2015 24.9* 26.0 - 34.0 pg Final  . MCHC 08/24/2015 30.9  30.0 - 36.0 g/dL Final  . RDW 08/24/2015 15.4  11.5 - 15.5 % Final  . Platelets 08/24/2015 288  150 - 400 K/uL Final  . Sodium 08/24/2015 136  135 - 145 mmol/L Final  . Potassium 08/24/2015 4.0  3.5 - 5.1 mmol/L Final  . Chloride 08/24/2015 98* 101 - 111 mmol/L Final  . CO2 08/24/2015 29  22 - 32 mmol/L Final  . Glucose, Bld 08/24/2015 113* 65 - 99 mg/dL Final  . BUN 08/24/2015 12  6 - 20 mg/dL Final  . Creatinine, Ser 08/24/2015 0.74  0.44 - 1.00 mg/dL Final  . Calcium 08/24/2015 8.9  8.9 - 10.3 mg/dL Final  . GFR calc non Af Amer 08/24/2015 >60  >60 mL/min Final  . GFR calc Af Amer 08/24/2015 >60  >60 mL/min Final   Comment: (NOTE) The eGFR has been calculated using the CKD EPI equation. This calculation has not been validated in all clinical situations. eGFR's persistently <60 mL/min signify possible Chronic Kidney Disease.   . Anion gap 08/24/2015 9  5 - 15 Final  . WBC 08/25/2015 10.7* 4.0 - 10.5 K/uL Final  . RBC 08/25/2015 3.36* 3.87 - 5.11 MIL/uL Final  . Hemoglobin 08/25/2015 8.7* 12.0 - 15.0 g/dL Final  . HCT 08/25/2015 27.7* 36.0 - 46.0 % Final  . MCV 08/25/2015 82.4  78.0 - 100.0 fL Final  . MCH 08/25/2015 25.9* 26.0 - 34.0 pg Final  . MCHC 08/25/2015 31.4  30.0 - 36.0 g/dL Final  . RDW 08/25/2015 15.7* 11.5 - 15.5 % Final  . Platelets 08/25/2015 256  150 - 400 K/uL Final  Hospital Outpatient Visit on 08/09/2015  Component Date Value Ref Range Status  . WBC 08/09/2015 9.9  4.0 - 10.5 K/uL Final  . RBC 08/09/2015 4.38  3.87 - 5.11 MIL/uL Final  . Hemoglobin 08/09/2015 11.4* 12.0 - 15.0 g/dL Final  . HCT 08/09/2015 35.7* 36.0 - 46.0 % Final  . MCV 08/09/2015 81.5  78.0 - 100.0 fL Final  . MCH 08/09/2015 26.0  26.0 - 34.0 pg Final  . MCHC  08/09/2015 31.9  30.0 - 36.0 g/dL Final  . RDW 08/09/2015 15.9* 11.5 - 15.5 % Final  . Platelets 08/09/2015 286  150 - 400 K/uL Final  . Neutrophils Relative % 08/09/2015 74   Final  . Neutro Abs 08/09/2015 7.3  1.7 - 7.7 K/uL Final  . Lymphocytes Relative 08/09/2015 18   Final  .  Lymphs Abs 08/09/2015 1.8  0.7 - 4.0 K/uL Final  . Monocytes Relative 08/09/2015 6   Final  . Monocytes Absolute 08/09/2015 0.6  0.1 - 1.0 K/uL Final  . Eosinophils Relative 08/09/2015 2   Final  . Eosinophils Absolute 08/09/2015 0.2  0.0 - 0.7 K/uL Final  . Basophils Relative 08/09/2015 0   Final  . Basophils Absolute 08/09/2015 0.0  0.0 - 0.1 K/uL Final  . Sodium 08/09/2015 140  135 - 145 mmol/L Final  . Potassium 08/09/2015 4.2  3.5 - 5.1 mmol/L Final  . Chloride 08/09/2015 104  101 - 111 mmol/L Final  . CO2 08/09/2015 28  22 - 32 mmol/L Final  . Glucose, Bld 08/09/2015 103* 65 - 99 mg/dL Final  . BUN 08/09/2015 14  6 - 20 mg/dL Final  . Creatinine, Ser 08/09/2015 0.74  0.44 - 1.00 mg/dL Final  . Calcium 08/09/2015 9.2  8.9 - 10.3 mg/dL Final  . Total Protein 08/09/2015 7.8  6.5 - 8.1 g/dL Final  . Albumin 08/09/2015 4.2  3.5 - 5.0 g/dL Final  . AST 08/09/2015 15  15 - 41 U/L Final  . ALT 08/09/2015 <5* 14 - 54 U/L Final   RESULTS CONFIRMED BY MANUAL DILUTION  . Alkaline Phosphatase 08/09/2015 141* 38 - 126 U/L Final  . Total Bilirubin 08/09/2015 0.3  0.3 - 1.2 mg/dL Final  . GFR calc non Af Amer 08/09/2015 >60  >60 mL/min Final  . GFR calc Af Amer 08/09/2015 >60  >60 mL/min Final   Comment: (NOTE) The eGFR has been calculated using the CKD EPI equation. This calculation has not been validated in all clinical situations. eGFR's persistently <60 mL/min signify possible Chronic Kidney Disease.   . Anion gap 08/09/2015 8  5 - 15 Final  . Prothrombin Time 08/09/2015 13.6  11.6 - 15.2 seconds Final  . INR 08/09/2015 1.02  0.00 - 1.49 Final  . Color, Urine 08/09/2015 YELLOW  YELLOW Final  . APPearance  08/09/2015 CLEAR  CLEAR Final  . Specific Gravity, Urine 08/09/2015 1.009  1.005 - 1.030 Final  . pH 08/09/2015 7.5  5.0 - 8.0 Final  . Glucose, UA 08/09/2015 NEGATIVE  NEGATIVE mg/dL Final  . Hgb urine dipstick 08/09/2015 NEGATIVE  NEGATIVE Final  . Bilirubin Urine 08/09/2015 NEGATIVE  NEGATIVE Final  . Ketones, ur 08/09/2015 NEGATIVE  NEGATIVE mg/dL Final  . Protein, ur 08/09/2015 NEGATIVE  NEGATIVE mg/dL Final  . Urobilinogen, UA 08/09/2015 0.2  0.0 - 1.0 mg/dL Final  . Nitrite 08/09/2015 NEGATIVE  NEGATIVE Final  . Leukocytes, UA 08/09/2015 NEGATIVE  NEGATIVE Final   MICROSCOPIC NOT DONE ON URINES WITH NEGATIVE PROTEIN, BLOOD, LEUKOCYTES, NITRITE, OR GLUCOSE <1000 mg/dL.  Marland Kitchen MRSA, PCR 08/09/2015 NEGATIVE  NEGATIVE Final  . Staphylococcus aureus 08/09/2015 NEGATIVE  NEGATIVE Final   Comment:        The Xpert SA Assay (FDA approved for NASAL specimens in patients over 69 years of age), is one component of a comprehensive surveillance program.  Test performance has been validated by [facility] for patients greater than or equal to 75 year old. It is not intended to diagnose infection nor to guide or monitor treatment.      X-Rays:Dg Chest 2 View  08/09/2015  CLINICAL DATA:  Preop for knee arthroplasty EXAM: CHEST  2 VIEW COMPARISON:  07/28/2011 FINDINGS: The heart size and mediastinal contours are within normal limits. Both lungs are clear. Mild degenerative changes mid thoracic spine. IMPRESSION: No active cardiopulmonary disease.  Electronically Signed   By: Lahoma Crocker M.D.   On: 08/09/2015 15:58    Hospital Course: Jacqueline Atkinson is a 59 y.o. who was admitted to [facility]. They were brought to the operating room on 08/22/2015 and underwent Procedure(s): LEFT TOTAL  KNEE  ARTHROPLASTY.  Patient tolerated the procedure well and was later transferred to the recovery room and then to the orthopaedic floor for postoperative care.  They were given PO and IV analgesics for  pain control following their surgery.  They were given 24 hours of postoperative antibiotics of  Anti-infectives    Start     Dose/Rate Route Frequency Ordered Stop   08/22/15 1900  ceFAZolin (ANCEF) IVPB 1 g/50 mL premix     1 g 100 mL/hr over 30 Minutes Intravenous Every 6 hours 08/22/15 1732 08/23/15 0117   08/22/15 1340  polymyxin B 500,000 Units, bacitracin 50,000 Units in sodium chloride irrigation 0.9 % 500 mL irrigation  Status:  Discontinued       As needed 08/22/15 1340 08/22/15 1509   08/22/15 0927  ceFAZolin (ANCEF) IVPB 2 g/50 mL premix     2 g 100 mL/hr over 30 Minutes Intravenous On call to O.R. 08/22/15 9381 08/22/15 1335     and started on DVT prophylaxis in the form of Xarelto.   PT and OT were ordered for total joint protocol.  Discharge planning consulted to help with postop disposition and equipment needs.  Patient had a difficult night on the evening of surgery due to nausea, vomiting, and dizziness.  They started to get up OOB with therapy on day one. Hemovac drain was pulled without difficulty. Saw some improvement with introduction of meclizine. Continued to work with therapy into day two. By day three, the patient had progressed slowly with therapy and became evident that she would require SNF placement.    Diet: Cardiac diet Activity:WBAT Follow-up:in 2 weeks Disposition - Skilled nursing facility Discharged Condition: stable   Discharge Instructions    Call MD / Call 911    Complete by:  As directed   If you experience chest pain or shortness of breath, CALL 911 and be transported to the hospital emergency room.  If you develope a fever above 101 F, pus (white drainage) or increased drainage or redness at the wound, or calf pain, call your surgeon's office.     Constipation Prevention    Complete by:  As directed   Drink plenty of fluids.  Prune juice may be helpful.  You may use a stool softener, such as Colace (over the counter) 100 mg twice a day.  Use  MiraLax (over the counter) for constipation as needed.     Diet - low sodium heart healthy    Complete by:  As directed      Discharge instructions    Complete by:  As directed   INSTRUCTIONS AFTER JOINT REPLACEMENT   Remove items at home which could result in a fall. This includes throw rugs or furniture in walking pathways ICE to the affected joint every three hours while awake for 30 minutes at a time, for at least the first 3-5 days, and then as needed for pain and swelling.  Continue to use ice for pain and swelling. You may notice swelling that will progress down to the foot and ankle.  This is normal after surgery.  Elevate your leg when you are not up walking on it.   Continue to use the breathing machine you  got in the hospital (incentive spirometer) which will help keep your temperature down.  It is common for your temperature to cycle up and down following surgery, especially at night when you are not up moving around and exerting yourself.  The breathing machine keeps your lungs expanded and your temperature down.   DIET:  As you were doing prior to hospitalization, we recommend a well-balanced diet.  DRESSING / WOUND CARE / SHOWERING  Keep the surgical dressing until follow up.  The dressing is water proof, so you can shower without any extra covering.  IF THE DRESSING FALLS OFF or the wound gets wet inside, change the dressing with sterile gauze.  Please use good hand washing techniques before changing the dressing.  Do not use any lotions or creams on the incision until instructed by your surgeon.    ACTIVITY  Increase activity slowly as tolerated, but follow the weight bearing instructions below.   No driving for 6 weeks or until further direction given by your physician.  You cannot drive while taking narcotics.  No lifting or carrying greater than 10 lbs. until further directed by your surgeon. Avoid periods of inactivity such as sitting longer than an hour when not asleep.  This helps prevent blood clots.  You may return to work once you are authorized by your doctor.     WEIGHT BEARING   Weight bearing as tolerated with assist device (walker, cane, etc) as directed, use it as long as suggested by your surgeon or therapist, typically at least 4-6 weeks.   EXERCISES  Results after joint replacement surgery are often greatly improved when you follow the exercise, range of motion and muscle strengthening exercises prescribed by your doctor. Safety measures are also important to protect the joint from further injury. Any time any of these exercises cause you to have increased pain or swelling, decrease what you are doing until you are comfortable again and then slowly increase them. If you have problems or questions, call your caregiver or physical therapist for advice.   Rehabilitation is important following a joint replacement. After just a few days of immobilization, the muscles of the leg can become weakened and shrink (atrophy).  These exercises are designed to build up the tone and strength of the thigh and leg muscles and to improve motion. Often times heat used for twenty to thirty minutes before working out will loosen up your tissues and help with improving the range of motion but do not use heat for the first two weeks following surgery (sometimes heat can increase post-operative swelling).   These exercises can be done on a training (exercise) mat, on the floor, on a table or on a bed. Use whatever works the best and is most comfortable for you.    Use music or television while you are exercising so that the exercises are a pleasant break in your day. This will make your life better with the exercises acting as a break in your routine that you can look forward to.   Perform all exercises about fifteen times, three times per day or as directed.  You should exercise both the operative leg and the other leg as well.  Exercises include:   Quad Sets - Tighten up  the muscle on the front of the thigh (Quad) and hold for 5-10 seconds.   Straight Leg Raises - With your knee straight (if you were given a brace, keep it on), lift the leg to 60 degrees, hold for 3  seconds, and slowly lower the leg.  Perform this exercise against resistance later as your leg gets stronger.  Leg Slides: Lying on your back, slowly slide your foot toward your buttocks, bending your knee up off the floor (only go as far as is comfortable). Then slowly slide your foot back down until your leg is flat on the floor again.  Angel Wings: Lying on your back spread your legs to the side as far apart as you can without causing discomfort.  Hamstring Strength:  Lying on your back, push your heel against the floor with your leg straight by tightening up the muscles of your buttocks.  Repeat, but this time bend your knee to a comfortable angle, and push your heel against the floor.  You may put a pillow under the heel to make it more comfortable if necessary.   A rehabilitation program following joint replacement surgery can speed recovery and prevent re-injury in the future due to weakened muscles. Contact your doctor or a physical therapist for more information on knee rehabilitation.    CONSTIPATION  Constipation is defined medically as fewer than three stools per week and severe constipation as less than one stool per week.  Even if you have a regular bowel pattern at home, your normal regimen is likely to be disrupted due to multiple reasons following surgery.  Combination of anesthesia, postoperative narcotics, change in appetite and fluid intake all can affect your bowels.   YOU MUST use at least one of the following options; they are listed in order of increasing strength to get the job done.  They are all available over the counter, and you may need to use some, POSSIBLY even all of these options:    Drink plenty of fluids (prune juice may be helpful) and high fiber foods Colace 100 mg by  mouth twice a day  Senokot for constipation as directed and as needed Dulcolax (bisacodyl), take with full glass of water  Miralax (polyethylene glycol) once or twice a day as needed.  If you have tried all these things and are unable to have a bowel movement in the first 3-4 days after surgery call either your surgeon or your primary doctor.    If you experience loose stools or diarrhea, hold the medications until you stool forms back up.  If your symptoms do not get better within 1 week or if they get worse, check with your doctor.  If you experience "the worst abdominal pain ever" or develop nausea or vomiting, please contact the office immediately for further recommendations for treatment.   ITCHING:  If you experience itching with your medications, try taking only a single pain pill, or even half a pain pill at a time.  You can also use Benadryl over the counter for itching or also to help with sleep.   TED HOSE STOCKINGS:  Use stockings on both legs until for at least 2 weeks or as directed by physician office. They may be removed at night for sleeping.  MEDICATIONS:  See your medication summary on the "After Visit Summary" that nursing will review with you.  You may have some home medications which will be placed on hold until you complete the course of blood thinner medication.  It is important for you to complete the blood thinner medication as prescribed.  PRECAUTIONS:  If you experience chest pain or shortness of breath - call 911 immediately for transfer to the hospital emergency department.   If you develop a fever  greater that 101 F, purulent drainage from wound, increased redness or drainage from wound, foul odor from the wound/dressing, or calf pain - CONTACT YOUR SURGEON.                                                   FOLLOW-UP APPOINTMENTS:  If you do not already have a post-op appointment, please call the office for an appointment to be seen by your surgeon.  Guidelines for  how soon to be seen are listed in your "After Visit Summary", but are typically between 1-4 weeks after surgery.  OTHER INSTRUCTIONS:   Do not take vitamins or supplements while taking your blood thinner (xarelto)  MAKE SURE YOU:  Understand these instructions.  Get help right away if you are not doing well or get worse.    Thank you for letting us be a part of your medical care team.  It is a privilege we respect greatly.  We hope these instructions will help you stay on track for a fast and full recovery!     Increase activity slowly as tolerated    Complete by:  As directed             Medication List    STOP taking these medications        b complex vitamins tablet     cholecalciferol 1000 UNITS tablet  Commonly known as:  VITAMIN D     clonazePAM 0.5 MG tablet  Commonly known as:  KLONOPIN     cyclobenzaprine 10 MG tablet  Commonly known as:  FLEXERIL     diclofenac sodium 1 % Gel  Commonly known as:  VOLTAREN     OMEGA 3 PO     potassium chloride SA 20 MEQ tablet  Commonly known as:  K-DUR,KLOR-CON     Red Yeast Rice 600 MG Tabs     triamcinolone 0.025 % ointment  Commonly known as:  KENALOG     vitamin B-12 1000 MCG tablet  Commonly known as:  CYANOCOBALAMIN     vitamin C 100 MG tablet      TAKE these medications        acetaminophen 500 MG tablet  Commonly known as:  TYLENOL  Take 1,000 mg by mouth every 4 (four) hours as needed for moderate pain.     Carbidopa-Levodopa ER 25-100 MG tablet controlled release  Commonly known as:  SINEMET CR  Take 1 tablet by mouth at bedtime.     carbidopa-levodopa 25-100 MG tablet  Commonly known as:  SINEMET IR  Take 1 tablet 4 times a day     carbidopa-levodopa 50-200 MG tablet  Commonly known as:  SINEMET CR  Take 1 tablet by mouth at bedtime.     diltiazem 240 MG 24 hr capsule  Commonly known as:  CARTIA XT  Take 1 capsule (240 mg total) by mouth daily.     esomeprazole 40 MG capsule  Commonly  known as:  NEXIUM  Take 40 mg by mouth daily at 12 noon.     ferrous sulfate 325 (65 FE) MG tablet  Take 325 mg by mouth daily with breakfast.     furosemide 40 MG tablet  Commonly known as:  LASIX  Take 1 tablet (40 mg total) by mouth daily.     losartan 100 MG tablet  Commonly known as:  COZAAR  Take 1 tablet (100 mg total) by mouth daily.     meclizine 12.5 MG tablet  Commonly known as:  ANTIVERT  Take 1-2 tablets (12.5-25 mg total) by mouth 2 (two) times daily as needed for dizziness or nausea.     methocarbamol 500 MG tablet  Commonly known as:  ROBAXIN  Take 1 tablet (500 mg total) by mouth every 6 (six) hours as needed for muscle spasms.     omeprazole 40 MG capsule  Commonly known as:  PRILOSEC  Take 40 mg by mouth daily.     ondansetron 4 MG tablet  Commonly known as:  ZOFRAN  Take 1 tablet (4 mg total) by mouth every 6 (six) hours as needed for nausea.     oxyCODONE 5 MG immediate release tablet  Commonly known as:  Oxy IR/ROXICODONE  Take 1-3 tablets (5-15 mg total) by mouth every 3 (three) hours as needed for moderate pain or severe pain.     rivaroxaban 10 MG Tabs tablet  Commonly known as:  XARELTO  Take 1 tablet (10 mg total) by mouth daily with breakfast.           Follow-up Information    Follow up with [facility].   Why:  home health physical therapy   Contact information:   [address] [place] [address] [phone]       Follow up with [facility].   Why:  rolling walker and 3n1 (over the commode seat)   Contact information:   [place] [address] [phone]       Follow up with GIOFFRE,RONALD A, MD. Schedule an appointment as soon as possible for a visit in 2 weeks.   Specialty:  Orthopedic Surgery   Contact information:   [address] [place] [address] [phone]       Signed: Ardeen Jourdain, PA-C Orthopaedic Surgery 08/25/2015,  7:08 AM

## 2015-08-28 ENCOUNTER — Telehealth: Payer: Self-pay | Admitting: Neurology

## 2015-08-28 NOTE — Telephone Encounter (Signed)
Left message on machine for patient to call back.

## 2015-08-28 NOTE — Telephone Encounter (Signed)
PT called and wanted to have a call back because she is having some muscle contractions and wanted to know if she should increase her Carbadopa-Levodopa/Dawn CB# [phone]

## 2015-08-29 ENCOUNTER — Encounter: Payer: Self-pay | Admitting: Internal Medicine

## 2015-08-29 ENCOUNTER — Non-Acute Institutional Stay (SKILLED_NURSING_FACILITY): Payer: BLUE CROSS/BLUE SHIELD | Admitting: Internal Medicine

## 2015-08-29 DIAGNOSIS — K219 Gastro-esophageal reflux disease without esophagitis: Secondary | ICD-10-CM

## 2015-08-29 DIAGNOSIS — I10 Essential (primary) hypertension: Secondary | ICD-10-CM

## 2015-08-29 DIAGNOSIS — Z96652 Presence of left artificial knee joint: Secondary | ICD-10-CM

## 2015-08-29 DIAGNOSIS — D62 Acute posthemorrhagic anemia: Secondary | ICD-10-CM

## 2015-08-29 DIAGNOSIS — G709 Myoneural disorder, unspecified: Secondary | ICD-10-CM | POA: Diagnosis not present

## 2015-08-29 NOTE — Assessment & Plan Note (Signed)
SNF - admitted to SNF for OT/PT, xarelto as prophylaxis

## 2015-08-29 NOTE — Progress Notes (Signed)
MRN: [id number] Name: Jacqueline Atkinson  Sex: female Age: 59 y.o. DOB: Dec 11, 1955  [place] [facility]: [facility] Facility/Room:516 Level Of Care: SNF Provider: Inocencio Homes D Emergency Contacts: Extended Emergency Contact Information Primary Emergency Contact: Ovidio Hanger [place] of [place] Phone: [phone] Mobile Phone: [phone] Relation: Daughter Secondary Emergency Contact: Nigel Sloop Address: [address]          [place], [place] [address] Johnnette Litter of [place] Phone: [phone] Mobile Phone: [phone] Relation: Daughter  Code Status:   Allergies: Sulfa antibiotics  Chief Complaint  Patient presents with  . New Admit To SNF    HPI: Patient is 60 y.o. female with HTN, anxiety, HLD,GERD, a neuromuscular dx and L knee arthritis [facility]. Apparently no problems except post -op vertigo. Pt is admitted to SNF for OT/PT. While at SNF pt will be follwed for HTN, tx with diltiazem,lasix and losartan, anemia, tx with iron and GERD tx with nexium.  Past Medical History  Diagnosis Date  . Allergy   . Hypertension   . Anxiety   . Hyperlipidemia   . Arthritis     Left knee  . 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 Heart murmur   . Hx of seasonal allergies     occ. use OTC Mucinex or Antihistamine.  Marland Kitchen GERD (gastroesophageal reflux disease)   . History of hiatal hernia     small  . Anemia   . Neuromuscular disorder ([place])     "multi system atrophy disease"- "pain, bilateral foot numbness, left knee pain"- Dr. Carles Collet is following.  . Shoulder disorder     right shoulder "rigidity due to Multi system atrophy"- ROM not limited"just tires me"  . Edema extremities     lower extremity edema left greater than right.  . Mobility impaired     ambulates with walker, tendency to lose balance.    Past Surgical History  Procedure Laterality  Date  . Foot surgery  2011    3rd metatarsal LT foot  . Tonsillectomy and adenoidectomy    . Knee arthroscopy Left 02/08/2015    Procedure: LEFT KNEE ARTHROSCOPY ;  Surgeon: Latanya Maudlin, MD;  Location: [facility];  Service: Orthopedics;  Laterality: Left;  . Esophagogastroduodenoscopy endoscopy      with esophageal dilation  . Total knee arthroplasty Left 08/22/2015    Procedure: LEFT TOTAL  KNEE  ARTHROPLASTY;  Surgeon: Latanya Maudlin, MD;  Location: [facility];  Service: Orthopedics;  Laterality: Left;      Medication List       This list is accurate as of: 08/29/15  9:34 PM.  Always use your most recent med list.               acetaminophen 500 MG tablet  Commonly known as:  TYLENOL  Take 1,000 mg by mouth every 4 (four) hours as needed for moderate pain.     Carbidopa-Levodopa ER 25-100 MG tablet controlled release  Commonly known as:  SINEMET CR  Take 1 tablet by mouth at bedtime.     carbidopa-levodopa 25-100 MG tablet  Commonly known as:  SINEMET IR  Take 1 tablet 4 times a day     diltiazem 240 MG 24 hr capsule  Commonly known as:  CARTIA XT  Take 1 capsule (240 mg total) by mouth daily.     esomeprazole 40 MG capsule  Commonly known as:  NEXIUM  Take 40 mg by mouth daily at 12 noon.     ferrous sulfate 325 (65 FE) MG tablet  Take 325 mg by mouth daily with breakfast.     furosemide 40 MG tablet  Commonly known as:  LASIX  Take 1 tablet (40 mg total) by mouth daily.     losartan 100 MG tablet  Commonly known as:  COZAAR  Take 1 tablet (100 mg total) by mouth daily.     meclizine 12.5 MG tablet  Commonly known as:  ANTIVERT  Take 1-2 tablets (12.5-25 mg total) by mouth 2 (two) times daily as needed for dizziness or nausea.     methocarbamol 500 MG tablet  Commonly known as:  ROBAXIN  Take 1 tablet (500 mg total) by mouth every 6 (six) hours as needed for muscle spasms.     ondansetron 4 MG tablet  Commonly known as:  ZOFRAN  Take 1 tablet (4 mg total) by  mouth every 6 (six) hours as needed for nausea.     oxyCODONE 5 MG immediate release tablet  Commonly known as:  Oxy IR/ROXICODONE  Take 1-3 tablets (5-15 mg total) by mouth every 3 (three) hours as needed for moderate pain or severe pain.     rivaroxaban 10 MG Tabs tablet  Commonly known as:  XARELTO  Take 1 tablet (10 mg total) by mouth daily with breakfast.        No orders of the defined types were placed in this encounter.    Immunization History  Administered Date(s) Administered  . Influenza Split 08/07/2013  . Tdap 11/27/2012    Social History  Substance Use Topics  . Smoking status: Former Smoker -- 1.00 packs/day for 12 years    Quit date: 03/14/1988  . Smokeless tobacco: Never Used  . Alcohol Use: No    Family history+ CA, DM2  Review of Systems  DATA OBTAINED: from patient, nurse, sister GENERAL:  no fevers, fatigue, appetite changes SKIN: No itching, rash or wounds EYES: No eye pain, redness, discharge EARS: No earache, tinnitus, change in hearing NOSE: No congestion, drainage or bleeding  MOUTH/THROAT: No mouth or tooth pain, No sore throat RESPIRATORY: No cough, wheezing, SOB CARDIAC: No chest pain, palpitations, lower extremity edema  GI: No abdominal pain, No N/V/D or constipation, No heartburn or reflux  GU: No dysuria, frequency or urgency, or incontinence  MUSCULOSKELETAL: unrelieved bone/joint pain, would like scheduled meds NEUROLOGIC: No headache, dizziness or focal weakness PSYCHIATRIC: No c/o anxiety or sadness   Filed Vitals:   08/29/15 1707  BP: 124/79  Pulse: 86  Temp: 97.9 F (36.6 C)  Resp: 20    SpO2 Readings from Last 1 Encounters:  08/25/15 97%        Physical Exam  GENERAL APPEARANCE: Alert, conversant,  No acute distress.  SKIN: No diaphoresis rash HEAD: Normocephalic, atraumatic  EYES: Conjunctiva/lids clear. Pupils round, reactive. EOMs intact.  EARS: External exam WNL, canals clear. Hearing grossly normal.   NOSE: No deformity or discharge.  MOUTH/THROAT: Lips w/o lesions  RESPIRATORY: Breathing is even, unlabored. Lung sounds are clear   CARDIOVASCULAR: Heart RRR no murmurs, rubs or gallops. Mild LLE peripheral edema.   GASTROINTESTINAL: Abdomen is soft, non-tender, not distended w/ normal bowel sounds. GENITOURINARY: Bladder non tender, not distended  MUSCULOSKELETAL: L leg in knee imobilizer NEUROLOGIC:  Cranial nerves 2-12 grossly intact. Moves all extremities  PSYCHIATRIC: Mood and affect appropriate to situation, no behavioral issues  Patient Active Problem List   Diagnosis Date Noted  . Postoperative anemia due to acute blood loss 08/29/2015  . 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 Neuromuscular disease ([place]) 09/26/2014  . Gait difficulty 12/29/2013  . Back pain 12/29/2013  . Knee pain 12/19/2012  . Paresthesia 12/19/2012  . Hypertension 11/28/2012  . Polypharmacy 11/28/2012  . GERD (gastroesophageal reflux disease) 11/28/2012    CBC    Component Value Date/Time   WBC 10.7* 08/25/2015 0618   WBC 8.8 06/20/2015 1128   RBC 3.36* 08/25/2015 0618   RBC 4.61 06/20/2015 1128   HGB 8.7* 08/25/2015 0618   HGB 11.0* 06/20/2015 1128   HCT 27.7* 08/25/2015 0618   HCT 35.9* 06/20/2015 1128   PLT 256 08/25/2015 0618   MCV 82.4 08/25/2015 0618   MCV 77.8* 06/20/2015 1128   LYMPHSABS 1.8 08/09/2015 1515   MONOABS 0.6 08/09/2015 1515   EOSABS 0.2 08/09/2015 1515   BASOSABS 0.0 08/09/2015 1515    CMP     Component Value Date/Time   NA 136 08/24/2015 0545   NA 142 12/29/2013 0911   K 4.0 08/24/2015 0545   CL 98* 08/24/2015 0545   CO2 29 08/24/2015 0545   GLUCOSE 113* 08/24/2015 0545   GLUCOSE 104* 12/29/2013 0911   BUN 12 08/24/2015 0545   BUN 14 12/29/2013 0911   CREATININE 0.74 08/24/2015 0545   CREATININE 0.70 06/20/2015 1126   CALCIUM 8.9 08/24/2015 0545   PROT 7.8 08/09/2015 1515   PROT 7.0 12/29/2013 0911   ALBUMIN 4.2 08/09/2015 1515   ALBUMIN 4.5  12/29/2013 0911   AST 15 08/09/2015 1515   ALT <5* 08/09/2015 1515   ALKPHOS 141* 08/09/2015 1515   BILITOT 0.3 08/09/2015 1515   GFRNONAA >60 08/24/2015 0545   GFRAA >60 08/24/2015 0545    No results found for: HGBA1C   Dg Chest 2 View  08/09/2015  CLINICAL DATA:  Preop for knee arthroplasty EXAM: CHEST  2 VIEW COMPARISON:  07/28/2011 FINDINGS: The heart size and mediastinal contours are within normal limits. Both lungs are clear. Mild degenerative changes mid thoracic spine. IMPRESSION: No active cardiopulmonary disease. Electronically Signed   By: Lahoma Crocker M.D.   On: 08/09/2015 15:58    Not all labs, radiology exams or other studies done during hospitalization come through on my EPIC note; however they are reviewed by me.    Assessment and Plan  History of total knee arthroplasty SNF - admitted to SNF for OT/PT, xarelto as prophylaxis  Postoperative anemia due to acute blood loss SNF - ost -op Hb 8.7; wi;ll recheck CBC once at SNF; cont daily iron  Hypertension Stable on Dilt 240 mg, losartan 100 mg daily and lasix 40 mg daily  GERD (gastroesophageal reflux disease) SNF - stable; cont nexium  Neuromuscular disease ([place]) Multiple system atrophySNF - pt is tx with parkinsons drugs which will be continued   Time spent > 35 min;> 50% of time with patient was spent reviewing records, labs, tests and studies, counseling and developing plan of care  Hennie Duos, MD

## 2015-08-29 NOTE — Assessment & Plan Note (Signed)
Stable on Dilt 240 mg, losartan 100 mg daily and lasix 40 mg daily

## 2015-08-29 NOTE — Assessment & Plan Note (Signed)
SNF - stable; cont nexium

## 2015-08-29 NOTE — Assessment & Plan Note (Signed)
SNF - ost -op Hb 8.7; wi;ll recheck CBC once at SNF; cont daily iron

## 2015-08-29 NOTE — Assessment & Plan Note (Signed)
Multiple system atrophySNF - pt is tx with parkinsons drugs which will be continued

## 2015-08-30 ENCOUNTER — Other Ambulatory Visit: Payer: Self-pay | Admitting: *Deleted

## 2015-08-30 MED ORDER — OXYCODONE HCL 5 MG PO TABS: ORAL_TABLET | ORAL | Status: DC

## 2015-08-30 NOTE — Telephone Encounter (Signed)
Spoke with patient. She states she had knee replacement surgery on 08/22/2015 and after has developed muscle spasms/contractions in the opposite thigh. She is on a combination of muscle relaxers/pain medication from her surgery and it has gotten better since she has been up moving more. I let her know this sounds related to the surgery from maybe the position that she was in vs from her [place] but I would run this by Dr Tat and make sure she didn't want to make any changes since patient is doing better. She does have trouble with taken more Levodopa at a time, but didn't know if she should move dosages closer together. I advised to keep everything the same until she is over her surgery since this seems to be slowly getting better, but stated I would call her back if Dr Tat decided differently.

## 2015-08-30 NOTE — Telephone Encounter (Signed)
[facility] 

## 2015-08-30 NOTE — Telephone Encounter (Signed)
I agree.  Don't think that I would want to change my meds in the post op recovery time since just had surgery

## 2015-09-05 ENCOUNTER — Non-Acute Institutional Stay (SKILLED_NURSING_FACILITY): Payer: BLUE CROSS/BLUE SHIELD | Admitting: Internal Medicine

## 2015-09-05 ENCOUNTER — Encounter: Payer: Self-pay | Admitting: Internal Medicine

## 2015-09-05 DIAGNOSIS — I10 Essential (primary) hypertension: Secondary | ICD-10-CM

## 2015-09-05 DIAGNOSIS — G709 Myoneural disorder, unspecified: Secondary | ICD-10-CM

## 2015-09-05 DIAGNOSIS — K219 Gastro-esophageal reflux disease without esophagitis: Secondary | ICD-10-CM | POA: Diagnosis not present

## 2015-09-05 DIAGNOSIS — Z96652 Presence of left artificial knee joint: Secondary | ICD-10-CM

## 2015-09-05 DIAGNOSIS — D62 Acute posthemorrhagic anemia: Secondary | ICD-10-CM | POA: Diagnosis not present

## 2015-09-05 NOTE — Progress Notes (Signed)
MRN: [id number] Name: Jacqueline Atkinson  Sex: female Age: 59 y.o. DOB: November 12, 1955  [place] #: [facility] Facility/Room:515 Level Of Care: SNF Provider: Inocencio Homes D Emergency Contacts: Extended Emergency Contact Information Primary Emergency Contact: Ovidio Hanger States of [place] Phone: [phone] Mobile Phone: [phone] Relation: Daughter Secondary Emergency Contact: Nigel Sloop Address: [address]          [place], [place] [address] Johnnette Litter of [place] Phone: [phone] Mobile Phone: [phone] Relation: Daughter  Code Status:   Allergies: Sulfa antibiotics  Chief Complaint  Patient presents with  . Discharge Note    HPI: Patient is 59 y.o. female with HTN, anxiety, HLD,GERD, a neuromuscular dx and L knee arthritis [facility]. Apparently no problems except post -op vertigo. Pt is admitted to SNF for OT/PT. Pt is now ready to be d/c to home. Pt had a surgery f/u CBC here with Hb 9.2 which was improved from post surgery Hb of 8.7.  Past Medical History  Diagnosis Date  . Allergy   . Hypertension   . Anxiety   . Hyperlipidemia   . Arthritis     Left knee  . 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 Heart murmur   . Hx of seasonal allergies     occ. use OTC Mucinex or Antihistamine.  Marland Kitchen GERD (gastroesophageal reflux disease)   . History of hiatal hernia     small  . Anemia   . Neuromuscular disorder ([place])     "multi system atrophy disease"- "pain, bilateral foot numbness, left knee pain"- Dr. Carles Collet is following.  . Shoulder disorder     right shoulder "rigidity due to Multi system atrophy"- ROM not limited"just tires me"  . Edema extremities     lower extremity edema left greater than right.  . Mobility impaired     ambulates with walker, tendency to lose balance.    Past Surgical History  Procedure Laterality  Date  . Foot surgery  2011    3rd metatarsal LT foot  . Tonsillectomy and adenoidectomy    . Knee arthroscopy Left 02/08/2015    Procedure: LEFT KNEE ARTHROSCOPY ;  Surgeon: Latanya Maudlin, MD;  Location: [facility];  Service: Orthopedics;  Laterality: Left;  . Esophagogastroduodenoscopy endoscopy      with esophageal dilation  . Total knee arthroplasty Left 08/22/2015    Procedure: LEFT TOTAL  KNEE  ARTHROPLASTY;  Surgeon: Latanya Maudlin, MD;  [facility]: [facility];  Service: Orthopedics;  Laterality: Left;      Medication List       This list is accurate as of: 09/05/15 11:50 AM.  Always use your most recent med list.               acetaminophen 500 MG tablet  Commonly known as:  TYLENOL  Take 1,000 mg by mouth every 4 (four) hours as needed for moderate pain.     Carbidopa-Levodopa ER 25-100 MG tablet controlled release  Commonly known as:  SINEMET CR  Take 1 tablet by mouth at bedtime.     carbidopa-levodopa 25-100 MG tablet  Commonly known as:  SINEMET IR  Take 1 tablet 4 times a day     diltiazem 240 MG 24 hr capsule  Commonly known as:  CARTIA XT  Take 1 capsule (240 mg total) by mouth daily.     esomeprazole 40  MG capsule  Commonly known as:  NEXIUM  Take 40 mg by mouth daily at 12 noon.     ferrous sulfate 325 (65 FE) MG tablet  Take 325 mg by mouth daily with breakfast.     furosemide 40 MG tablet  Commonly known as:  LASIX  Take 1 tablet (40 mg total) by mouth daily.     losartan 100 MG tablet  Commonly known as:  COZAAR  Take 1 tablet (100 mg total) by mouth daily.     meclizine 12.5 MG tablet  Commonly known as:  ANTIVERT  Take 1-2 tablets (12.5-25 mg total) by mouth 2 (two) times daily as needed for dizziness or nausea.     methocarbamol 500 MG tablet  Commonly known as:  ROBAXIN  Take 1 tablet (500 mg total) by mouth every 6 (six) hours as needed for muscle spasms.     ondansetron 4 MG tablet  Commonly known as:  ZOFRAN  Take 1 tablet (4 mg total) by  mouth every 6 (six) hours as needed for nausea.     oxyCODONE 5 MG immediate release tablet  Commonly known as:  Oxy IR/ROXICODONE  Take one tablet by mouth every 6 hours as needed for pain     rivaroxaban 10 MG Tabs tablet  Commonly known as:  XARELTO  Take 1 tablet (10 mg total) by mouth daily with breakfast.        No orders of the defined types were placed in this encounter.    Immunization History  Administered Date(s) Administered  . Influenza Split 08/07/2013  . Tdap 11/27/2012    Social History  Substance Use Topics  . Smoking status: Former Smoker -- 1.00 packs/day for 12 years    Quit date: 03/14/1988  . Smokeless tobacco: Never Used  . Alcohol Use: No    Filed Vitals:   09/05/15 1147  BP: 113/76  Pulse: 78  Temp: 97.2 F (36.2 C)  Resp: 18    Physical Exam  GENERAL APPEARANCE: Alert, conversant. No acute distress.  HEENT: Unremarkable. RESPIRATORY: Breathing is even, unlabored. Lung sounds are clear   CARDIOVASCULAR: Heart RRR no murmurs, rubs or gallops. No peripheral edema.  GASTROINTESTINAL: Abdomen is soft, non-tender, not distended w/ normal bowel sounds.  NEUROLOGIC: Cranial nerves 2-12 grossly intact. Moves all extremities  Patient Active Problem List   Diagnosis Date Noted  . Postoperative anemia due to acute blood loss 08/29/2015  . 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 Neuromuscular disease ([place]) 09/26/2014  . Gait difficulty 12/29/2013  . Back pain 12/29/2013  . Knee pain 12/19/2012  . Paresthesia 12/19/2012  . Hypertension 11/28/2012  . Polypharmacy 11/28/2012  . GERD (gastroesophageal reflux disease) 11/28/2012    CBC    Component Value Date/Time   WBC 10.7* 08/25/2015 0618   WBC 8.8 06/20/2015 1128   RBC 3.36* 08/25/2015 0618   RBC 4.61 06/20/2015 1128   HGB 8.7* 08/25/2015 0618   HGB 11.0* 06/20/2015 1128   HCT 27.7* 08/25/2015 0618   HCT 35.9* 06/20/2015 1128   PLT 256 08/25/2015 0618   MCV 82.4 08/25/2015  0618   MCV 77.8* 06/20/2015 1128   LYMPHSABS 1.8 08/09/2015 1515   MONOABS 0.6 08/09/2015 1515   EOSABS 0.2 08/09/2015 1515   BASOSABS 0.0 08/09/2015 1515    CMP     Component Value Date/Time   NA 136 08/24/2015 0545   NA 142 12/29/2013 0911   K 4.0 08/24/2015 0545   CL 98*  08/24/2015 0545   CO2 29 08/24/2015 0545   GLUCOSE 113* 08/24/2015 0545   GLUCOSE 104* 12/29/2013 0911   BUN 12 08/24/2015 0545   BUN 14 12/29/2013 0911   CREATININE 0.74 08/24/2015 0545   CREATININE 0.70 06/20/2015 1126   CALCIUM 8.9 08/24/2015 0545   PROT 7.8 08/09/2015 1515   PROT 7.0 12/29/2013 0911   ALBUMIN 4.2 08/09/2015 1515   ALBUMIN 4.5 12/29/2013 0911   AST 15 08/09/2015 1515   ALT <5* 08/09/2015 1515   ALKPHOS 141* 08/09/2015 1515   BILITOT 0.3 08/09/2015 1515   GFRNONAA >60 08/24/2015 0545   GFRAA >60 08/24/2015 0545    Assessment and Plan  Pt is d/c to home with HH/OT/PT/nursing. Rx's written with 2 week supply Oxycodone ( # 50, 0R). Next ortho f/u is 2 weeks from 09/04/2015.   Time spent > 30 min;> 50% of time with patient was spent reviewing records, labs, tests and studies, counseling and developing plan of care  Hennie Duos, MD

## 2015-09-14 ENCOUNTER — Inpatient Hospital Stay: Payer: BLUE CROSS/BLUE SHIELD | Admitting: Family Medicine

## 2015-10-06 ENCOUNTER — Ambulatory Visit (INDEPENDENT_AMBULATORY_CARE_PROVIDER_SITE_OTHER): Payer: BLUE CROSS/BLUE SHIELD | Admitting: Family Medicine

## 2015-10-06 VITALS — BP 110/70 | HR 89 | Temp 97.5°F | Resp 16 | Wt 210.4 lb

## 2015-10-06 DIAGNOSIS — I10 Essential (primary) hypertension: Secondary | ICD-10-CM | POA: Diagnosis not present

## 2015-10-06 DIAGNOSIS — D62 Acute posthemorrhagic anemia: Secondary | ICD-10-CM

## 2015-10-06 DIAGNOSIS — G903 Multi-system degeneration of the autonomic nervous system: Secondary | ICD-10-CM | POA: Diagnosis not present

## 2015-10-06 DIAGNOSIS — K219 Gastro-esophageal reflux disease without esophagitis: Secondary | ICD-10-CM | POA: Diagnosis not present

## 2015-10-06 DIAGNOSIS — Z5181 Encounter for therapeutic drug level monitoring: Secondary | ICD-10-CM

## 2015-10-06 DIAGNOSIS — G238 Other specified degenerative diseases of basal ganglia: Secondary | ICD-10-CM

## 2015-10-06 LAB — CBC
HCT: 35.8 % — ABNORMAL LOW (ref 36.0–46.0)
HEMOGLOBIN: 11.3 g/dL — AB (ref 12.0–15.0)
MCH: 25.9 pg — AB (ref 26.0–34.0)
MCHC: 31.6 g/dL (ref 30.0–36.0)
MCV: 81.9 fL (ref 78.0–100.0)
MPV: 11 fL (ref 8.6–12.4)
Platelets: 277 10*3/uL (ref 150–400)
RBC: 4.37 MIL/uL (ref 3.87–5.11)
RDW: 16.9 % — ABNORMAL HIGH (ref 11.5–15.5)
WBC: 10.4 10*3/uL (ref 4.0–10.5)

## 2015-10-06 MED ORDER — LOSARTAN POTASSIUM 100 MG PO TABS: 100.0000 mg | ORAL_TABLET | Freq: Every day | ORAL | Status: DC

## 2015-10-06 MED ORDER — DILTIAZEM HCL ER COATED BEADS 240 MG PO CP24: 240.0000 mg | ORAL_CAPSULE | Freq: Every day | ORAL | Status: DC

## 2015-10-06 MED ORDER — FUROSEMIDE 40 MG PO TABS: 40.0000 mg | ORAL_TABLET | Freq: Every day | ORAL | Status: DC

## 2015-10-06 NOTE — Progress Notes (Signed)
Subjective:  This chart was scribed for [name], MD by Moises Blood, Medical Scribe. This patient was seen in Room 9 and the patient's care was started 2:41 PM.   Patient ID: Jacqueline Atkinson, female    DOB: 1956-05-01, 59 y.o.   MRN: [id number] Chief Complaint  Patient presents with  . Follow-up    blood work, meds refill cardizem, lasix, cozaar    HPI Jacqueline Atkinson is a 59 y.o. female who presents to [facility] follow up for blood work.  Pt recently had total left knee surgery. She has a h/o multiple system atrophy, and it's progressively worsening.   Left Knee She's doing physical therapy at [place] for her left knee and she's doing well. She has some pain but describes it as "achey muscles" versus arthritis. She feels generally well. She has some swelling, and would apply ice over the knee.   Medication She also needs medication refill on her dilitiazem, lasix and cozaar. She denies complications with her medications.  She started taking Vitamin B12, Vitamin D and iron supplements 2 weeks ago. She's eating chicken liver and frosted mini wheats to increase her iron.   Personal She is brought in by her sister today.   Past Medical History  Diagnosis Date  . Allergy   . Hypertension   . Anxiety   . Hyperlipidemia   . Arthritis     Left knee  . 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 Heart murmur   . Hx of seasonal allergies     occ. use OTC Mucinex or Antihistamine.  Marland Kitchen GERD (gastroesophageal reflux disease)   . History of hiatal hernia     small  . Anemia   . Neuromuscular disorder ([place])     "multi system atrophy disease"- "pain, bilateral foot numbness, left knee pain"- Dr. Carles Collet is following.  . Shoulder disorder     right shoulder "rigidity due to Multi system atrophy"- ROM not limited"just tires me"  . Edema extremities     lower extremity edema left greater than right.  . Mobility impaired      ambulates with walker, tendency to lose balance.   Prior to Admission medications   Medication Sig Start Date End Date Taking? Authorizing Provider  acetaminophen (TYLENOL) 500 MG tablet Take 1,000 mg by mouth every 4 (four) hours as needed for moderate pain.    Yes Historical Provider, MD  carbidopa-levodopa (SINEMET IR) 25-100 MG per tablet Take 1 tablet 4 times a day 04/10/15  Yes Rebecca S Tat, DO  Carbidopa-Levodopa ER (SINEMET CR) 25-100 MG tablet controlled release Take 1 tablet by mouth at bedtime.   Yes Historical Provider, MD  diltiazem (CARTIA XT) 240 MG 24 hr capsule Take 1 capsule (240 mg total) by mouth daily. 05/02/15  Yes Shawnee Knapp, MD  esomeprazole (NEXIUM) 40 MG capsule Take 40 mg by mouth daily at 12 noon.   Yes Historical Provider, MD  ferrous sulfate 325 (65 FE) MG tablet Take 325 mg by mouth daily with breakfast.   Yes Historical Provider, MD  furosemide (LASIX) 40 MG tablet Take 1 tablet (40 mg total) by mouth daily. 04/21/15  Yes Shawnee Knapp, MD  losartan (COZAAR) 100 MG tablet Take 1 tablet (100 mg total) by mouth daily. 01/20/15  Yes Shawnee Knapp, MD  methocarbamol (ROBAXIN) 500 MG tablet Take 1 tablet (500 mg total) by mouth every 6 (  six) hours as needed for muscle spasms. 08/24/15  Yes Amber Cecilio Asper, PA-C  meclizine (ANTIVERT) 12.5 MG tablet Take 1-2 tablets (12.5-25 mg total) by mouth 2 (two) times daily as needed for dizziness or nausea. Patient not taking: Reported on 10/06/2015 08/24/15   Ardeen Jourdain, PA-C  ondansetron (ZOFRAN) 4 MG tablet Take 1 tablet (4 mg total) by mouth every 6 (six) hours as needed for nausea. Patient not taking: Reported on 10/06/2015 08/24/15   Ardeen Jourdain, PA-C  oxyCODONE (OXY IR/ROXICODONE) 5 MG immediate release tablet Take one tablet by mouth every 6 hours as needed for pain Patient not taking: Reported on 10/06/2015 08/30/15   Estill Dooms, MD  rivaroxaban (XARELTO) 10 MG TABS tablet Take 1 tablet (10 mg total) by mouth daily with  breakfast. Patient not taking: Reported on 10/06/2015 08/24/15   Ardeen Jourdain, PA-C   Allergies  Allergen Reactions  . Sulfa Antibiotics     Long time ago and does not remember reaction   Review of Systems  Constitutional: Negative for fever and chills.  Cardiovascular: Positive for leg swelling.  Gastrointestinal: Negative for nausea, vomiting, diarrhea and constipation.  Musculoskeletal: Positive for myalgias and gait problem. Negative for arthralgias.  Neurological: Negative for dizziness, light-headedness, numbness and headaches.       Objective:   Physical Exam  Constitutional: She is oriented to person, place, and time. She appears well-developed and well-nourished. No distress.  HENT:  Head: Normocephalic and atraumatic.  Eyes: EOM are normal. Pupils are equal, round, and reactive to light.  Neck: Neck supple.  Cardiovascular: Normal rate and regular rhythm.   Murmur heard.  Systolic murmur is present with a grade of 3/6  Pulmonary/Chest: Effort normal. No respiratory distress.  Musculoskeletal: Normal range of motion. She exhibits edema (lower extremities bilaterally).  Neurological: She is alert and oriented to person, place, and time.  Skin: Skin is warm and dry.  Psychiatric: She has a normal mood and affect. Her behavior is normal.  Nursing note and vitals reviewed.   BP 110/70 mmHg  Pulse 89  Temp(Src) 97.5 F (36.4 C) (Oral)  Resp 16  Wt 210 lb 6.4 oz (95.437 kg)  SpO2 98%     Assessment & Plan:   1. Acute blood loss anemia - Doing well after knee replacement - recheck hgb.  2. Medication monitoring encounter   3. Essential hypertension - well controlled on current reg, incerase K in diet with lasix  4. Gastroesophageal reflux disease without esophagitis   5. 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 Ferritin  . CBC  . Comprehensive metabolic panel    Meds ordered this  encounter  Medications  . losartan (COZAAR) 100 MG tablet    Sig: Take 1 tablet (100 mg total) by mouth daily.    Dispense:  90 tablet    Refill:  3  . diltiazem (CARTIA XT) 240 MG 24 hr capsule    Sig: Take 1 capsule (240 mg total) by mouth daily.    Dispense:  90 capsule    Refill:  3  . furosemide (LASIX) 40 MG tablet    Sig: Take 1 tablet (40 mg total) by mouth daily.    Dispense:  90 tablet    Refill:  1    I personally performed the services described in this documentation, which was scribed in my presence. The recorded information has been reviewed  and considered, and addended by me as needed.  [name], MD MPH     By signing my name below, I, Moises Blood, attest that this documentation has been prepared under the direction and in the presence of [name], MD. Electronically Signed: Moises Blood, [place]. 10/06/2015 , 2:41 PM .

## 2015-10-06 NOTE — Patient Instructions (Signed)
Remember while you are on the lasix to get plenty of potassium in your diet. Keep up with the 2000-5000u of vitamin D daily. Keep up with the vitamin B12 - we will check your level at your next routine visit.  Potassium Content of Foods Potassium is a mineral found in many foods and drinks. It helps keep fluids and minerals balanced in your body and affects how steadily your heart beats. Potassium also helps control your blood pressure and keep your muscles and nervous system healthy. Certain health conditions and medicines may change the balance of potassium in your body. When this happens, you can help balance your level of potassium through the foods that you do or do not eat. Your health care provider or dietitian may recommend an amount of potassium that you should have each day. The following lists of foods provide the amount of potassium (in parentheses) per serving in each item. HIGH IN POTASSIUM  The following foods and beverages have 200 mg or more of potassium per serving:  Apricots, 2 raw or 5 dry (200 mg).  Artichoke, 1 medium (345 mg).  Avocado, raw,  each (245 mg).  Banana, 1 medium (425 mg).  Beans, lima, or baked beans, canned,  cup (280 mg).  Beans, white, canned,  cup (595 mg).  Beef roast, 3 oz (320 mg).  Beef, ground, 3 oz (270 mg).  Beets, raw or cooked,  cup (260 mg).  Bran muffin, 2 oz (300 mg).  Broccoli,  cup (230 mg).  Brussels sprouts,  cup (250 mg).  Cantaloupe,  cup (215 mg).  Cereal, 100% bran,  cup (200-400 mg).  Cheeseburger, single, fast food, 1 each (225-400 mg).  Chicken, 3 oz (220 mg).  Clams, canned, 3 oz (535 mg).  Crab, 3 oz (225 mg).  Dates, 5 each (270 mg).  Dried beans and peas,  cup (300-475 mg).  Figs, dried, 2 each (260 mg).  Fish: halibut, tuna, cod, snapper, 3 oz (480 mg).  Fish: salmon, haddock, swordfish, perch, 3 oz (300 mg).  Fish, tuna, canned 3 oz (200 mg).  [place] fries, fast food, 3 oz (470  mg).  Granola with fruit and nuts,  cup (200 mg).  Grapefruit juice,  cup (200 mg).  Greens, beet,  cup (655 mg).  Honeydew melon,  cup (200 mg).  Kale, raw, 1 cup (300 mg).  Kiwi, 1 medium (240 mg).  Kohlrabi, rutabaga, parsnips,  cup (280 mg).  Lentils,  cup (365 mg).  Mango, 1 each (325 mg).  Milk, chocolate, 1 cup (420 mg).  Milk: nonfat, low-fat, whole, buttermilk, 1 cup (350-380 mg).  Molasses, 1 Tbsp (295 mg).  Mushrooms,  cup (280) mg.  Nectarine, 1 each (275 mg).  Nuts: almonds, peanuts, hazelnuts, [place], cashew, mixed, 1 oz (200 mg).  Nuts, pistachios, 1 oz (295 mg).  Orange, 1 each (240 mg).  Orange juice,  cup (235 mg).  Papaya, medium,  fruit (390 mg).  Peanut butter, chunky, 2 Tbsp (240 mg).  Peanut butter, smooth, 2 Tbsp (210 mg).  Pear, 1 medium (200 mg).  Pomegranate, 1 whole (400 mg).  Pomegranate juice,  cup (215 mg).  Pork, 3 oz (350 mg).  Potato chips, salted, 1 oz (465 mg).  Potato, baked with skin, 1 medium (925 mg).  Potatoes, boiled,  cup (255 mg).  Potatoes, mashed,  cup (330 mg).  Prune juice,  cup (370 mg).  Prunes, 5 each (305 mg).  Pudding, chocolate,  cup (230 mg).  Pumpkin,  canned,  cup (250 mg).  Raisins, seedless,  cup (270 mg).  Seeds, sunflower or pumpkin, 1 oz (240 mg).  Soy milk, 1 cup (300 mg).  Spinach,  cup (420 mg).  Spinach, canned,  cup (370 mg).  Sweet potato, baked with skin, 1 medium (450 mg).  Swiss chard,  cup (480 mg).  Tomato or vegetable juice,  cup (275 mg).  Tomato sauce or puree,  cup (400-550 mg).  Tomato, raw, 1 medium (290 mg).  Tomatoes, canned,  cup (200-300 mg).  [place], 3 oz (250 mg).  Wheat germ, 1 oz (250 mg).  Winter squash,  cup (250 mg).  Yogurt, plain or fruited, 6 oz (260-435 mg).  Zucchini,  cup (220 mg). MODERATE IN POTASSIUM The following foods and beverages have 50-200 mg of potassium per serving:  Apple, 1 each (150  mg).  Apple juice,  cup (150 mg).  Applesauce,  cup (90 mg).  Apricot nectar,  cup (140 mg).  Asparagus, small spears,  cup or 6 spears (155 mg).  Bagel, cinnamon raisin, 1 each (130 mg).  Bagel, egg or plain, 4 in., 1 each (70 mg).  Beans, green,  cup (90 mg).  Beans, yellow,  cup (190 mg).  Beer, regular, 12 oz (100 mg).  Beets, canned,  cup (125 mg).  Blackberries,  cup (115 mg).  Blueberries,  cup (60 mg).  Bread, whole wheat, 1 slice (70 mg).  Broccoli, raw,  cup (145 mg).  Cabbage,  cup (150 mg).  Carrots, cooked or raw,  cup (180 mg).  Cauliflower, raw,  cup (150 mg).  Celery, raw,  cup (155 mg).  Cereal, bran flakes, cup (120-150 mg).  Cheese, cottage,  cup (110 mg).  Cherries, 10 each (150 mg).  Chocolate, 1 oz bar (165 mg).  Coffee, brewed 6 oz (90 mg).  Corn,  cup or 1 ear (195 mg).  Cucumbers,  cup (80 mg).  Egg, large, 1 each (60 mg).  Eggplant,  cup (60 mg).  Endive, raw, cup (80 mg).  English muffin, 1 each (65 mg).  Fish, orange roughy, 3 oz (150 mg).  Frankfurter, beef or pork, 1 each (75 mg).  Fruit cocktail,  cup (115 mg).  Grape juice,  cup (170 mg).  Grapefruit,  fruit (175 mg).  Grapes,  cup (155 mg).  Greens: kale, turnip, collard,  cup (110-150 mg).  Ice cream or frozen yogurt, chocolate,  cup (175 mg).  Ice cream or frozen yogurt, vanilla,  cup (120-150 mg).  Lemons, limes, 1 each (80 mg).  Lettuce, all types, 1 cup (100 mg).  Mixed vegetables,  cup (150 mg).  Mushrooms, raw,  cup (110 mg).  Nuts: walnuts, pecans, or macadamia, 1 oz (125 mg).  Oatmeal,  cup (80 mg).  Okra,  cup (110 mg).  Onions, raw,  cup (120 mg).  Peach, 1 each (185 mg).  Peaches, canned,  cup (120 mg).  Pears, canned,  cup (120 mg).  Peas, green, frozen,  cup (90 mg).  Peppers, green,  cup (130 mg).  Peppers, red,  cup (160 mg).  Pineapple juice,  cup (165 mg).  Pineapple,  fresh or canned,  cup (100 mg).  Plums, 1 each (105 mg).  Pudding, vanilla,  cup (150 mg).  Raspberries,  cup (90 mg).  Rhubarb,  cup (115 mg).  Rice, wild,  cup (80 mg).  Shrimp, 3 oz (155 mg).  Spinach, raw, 1 cup (170 mg).  Strawberries,  cup (125 mg).  Summer  squash  cup (175-200 mg).  Swiss chard, raw, 1 cup (135 mg).  Tangerines, 1 each (140 mg).  Tea, brewed, 6 oz (65 mg).  Turnips,  cup (140 mg).  Watermelon,  cup (85 mg).  Wine, red, table, 5 oz (180 mg).  Wine, white, table, 5 oz (100 mg). LOW IN POTASSIUM The following foods and beverages have less than 50 mg of potassium per serving.  Bread, white, 1 slice (30 mg).  Carbonated beverages, 12 oz (less than 5 mg).  Cheese, 1 oz (20-30 mg).  Cranberries,  cup (45 mg).  Cranberry juice cocktail,  cup (20 mg).  Fats and oils, 1 Tbsp (less than 5 mg).  Hummus, 1 Tbsp (32 mg).  Nectar: papaya, mango, or pear,  cup (35 mg).  Rice, white or brown,  cup (50 mg).  Spaghetti or macaroni,  cup cooked (30 mg).  Tortilla, flour or corn, 1 each (50 mg).  Waffle, 4 in., 1 each (50 mg).  Water chestnuts,  cup (40 mg).   This information is not intended to replace advice given to you by your health care provider. Make sure you discuss any questions you have with your health care provider.   Document Released: 05/28/2005 Document Revised: 10/19/2013 Document Reviewed: 09/10/2013 Elsevier Interactive Patient Education [employer].

## 2015-10-07 LAB — COMPREHENSIVE METABOLIC PANEL
ALBUMIN: 4.1 g/dL (ref 3.6–5.1)
ALT: 4 U/L — ABNORMAL LOW (ref 6–29)
AST: 11 U/L (ref 10–35)
Alkaline Phosphatase: 165 U/L — ABNORMAL HIGH (ref 33–130)
BUN: 8 mg/dL (ref 7–25)
CALCIUM: 9.1 mg/dL (ref 8.6–10.4)
CHLORIDE: 101 mmol/L (ref 98–110)
CO2: 28 mmol/L (ref 20–31)
Creat: 0.79 mg/dL (ref 0.50–1.05)
Glucose, Bld: 96 mg/dL (ref 65–99)
POTASSIUM: 3.5 mmol/L (ref 3.5–5.3)
SODIUM: 142 mmol/L (ref 135–146)
TOTAL PROTEIN: 6.9 g/dL (ref 6.1–8.1)
Total Bilirubin: 0.3 mg/dL (ref 0.2–1.2)

## 2015-10-07 LAB — FERRITIN: FERRITIN: 63 ng/mL (ref 10–291)

## 2015-10-12 ENCOUNTER — Ambulatory Visit: Payer: BLUE CROSS/BLUE SHIELD | Admitting: Family Medicine

## 2015-10-13 ENCOUNTER — Encounter: Payer: Self-pay | Admitting: Neurology

## 2015-10-13 ENCOUNTER — Ambulatory Visit: Payer: BLUE CROSS/BLUE SHIELD | Admitting: Family Medicine

## 2015-10-13 ENCOUNTER — Ambulatory Visit (INDEPENDENT_AMBULATORY_CARE_PROVIDER_SITE_OTHER): Payer: BLUE CROSS/BLUE SHIELD | Admitting: Neurology

## 2015-10-13 VITALS — BP 114/70 | HR 80 | Ht 67.0 in | Wt 209.0 lb

## 2015-10-13 DIAGNOSIS — L853 Xerosis cutis: Secondary | ICD-10-CM | POA: Diagnosis not present

## 2015-10-13 DIAGNOSIS — G903 Multi-system degeneration of the autonomic nervous system: Secondary | ICD-10-CM

## 2015-10-13 DIAGNOSIS — G238 Other specified degenerative diseases of basal ganglia: Secondary | ICD-10-CM

## 2015-10-13 MED ORDER — CARBIDOPA-LEVODOPA 25-100 MG PO TABS: ORAL_TABLET | ORAL | Status: DC

## 2015-10-13 NOTE — Progress Notes (Signed)
Jacqueline Atkinson was seen today in the movement disorders clinic for neurologic consultation at the request of [name].   The patient presents today to the movement disorder clinic at [facility] for consultation regarding possible Parkinson's disease.  I had the opportunity to review records from [name], her prior neurologist.  It appears that the patient has been seeing [name] since 12/29/2013.  Patient reports that she just has not been the same ever since she had a fall in 2011.  Her R foot got caught in a stool and she fell.  She was subsequently placed under the care of orthopedic surgeons and intermittently would go to have fluid drained from her knee.   She developed a R leg drag.  She, therefore, initially attributed difficulty in walking to this injury.  She states that later on, she developed balance issues (cannot say how long that took to develop).  Over the course of time, however, she noticed decreased ability to use the right hand in order to write and began to use the left hand for more tasks.  She stated that she would have to reach across her body to use the mouse (noted that as of Christmas, 2014).  In Feb, she was at work and had a bad HA and then had a CT and subsequent MRI of the brain as they saw a meningioma and so she was referred to [name].  She saw [name] on 12/29/2013.  About a week later on 01/06/2014 she followed up with [name] who had recommended an EMG and this was done and was normal.  On 01/17/2014 [name] started her on levodopa and referred her for another opinion to [facility].  She has not yet had that appointment, but it is scheduled for 06/15/2014.  Pt states that when she first got to the pharmacy, there was a RX there for both and IR and CR tablet (she now thinks that it was accident)  She only picked up the CR and took it bid and stated that it helped her feel "looser" and more steady.  When she f/u with [name], she realized that she was supposed to be on  the IR but when she tried that, she had a lot of nausea and even emesis (tried it with carbs even but initially started with 2 po tid; then went to 1 po 6 times per day and didn't have as much emesis but still had to have the carbs with it and she didn't like all the carb intake).  Therefore, she went back to the CR version on her own and she is only on one tablet at 8am/8pm. She presents today for another opinion.  06/24/14 update:  Pt presents for f/u.  DaT scan was done yesterday.  There was significant reduction in uptake of the radiotracer in the bilateral putamen with activity confined to the caudate.  Pt remains on carbidopa/levodopa 25/100 CR at 8am/8pm.  She has been having more knee pain and went to [facility].  She was told that she needs an MRI but she decided that she didn't want to do an MRI if she wasn't going to have surgery and she didn't want to consider that.  She was given klonopin for insomnia since last visit.  She rarely takes it and said that she only took it a few times.  One of the reasons for insomnia is stiffness associated with the back pain.  Klonopin does help her sleep  when she takes it.  She had one fall since last visit; she was going up the steps and her knee buckled and fell.  She is not sure if her knee caused her to fall.  She is exercising; she is doing yoga twice per week.  She is now able to get off of the floor which she didn't used to be able to do.  She is getting ready to start the [facility] exercise class.   09/26/14 update:  Pt returns today for f/u.  I was trying to transition her slowly to the IR formulation of levodopa, which she has had trouble tolerating in the past.  Last visit, I asked her to try to take carbidopa/levodopa 25/100 CR at 8am and 8pm and carbidopa/levodopa 25/100 IR at noon.  She actually moved up the dosage on her own and she is taking carbidopa/levodopa 25/100, 2 at 8am, 2 at noon, 2 at 4pm, and 2 at 8pm.  She may miss one of those dosages and may  only get in 6 pills per day.  She gets really nauseated after 10 minutes but then it gets better if she can get throught that 10 minutes.   MBE was performed on 07/01/14 and it was normal and a regular, thin liquid diet was recommended.  She did got to UC after a fall on knee on 09/13/14. She needs a knee surgery but had to wait until insurance changes.   She is exercising; she is doing yoga twice per week.  C/o a pain in the mid thoracic region that wakes her up in the middle of the night.  Lidoderm has helped but it was very expensive.  Reviewed MRI report from [facility] in 12/2013 and it was normal.  Having more urinary incontinence.  Mood is really good; feels like she is at best place that she has been in virtually all her life.  02/06/15 update:  Pt is accompanied by her sister who supplements the history.  The patient has a history of multiple system atrophy, cerebellar type.  She is on carbidopa/levodopa 25/100.  She is supposed to be on 2 tablets by mouth 4 times per day that she seems to have nausea and emesis when she takes it that way, so she is only taking 1 tablet by mouth 4 times per day.  She is also on Indocin twice a day and is hoping to get off of that after her knee surgery.  She is having knee surgery on Wednesday, which she thinks is the primary etiology for some of her gait issues.  She has been awaiting this knee surgery for a long time.  Her orthopedic surgeon did tell her that he was not sure that it was going to be able to help all of her knee issues, including her gait, however.  No falls.  She went to an ataxia conference in [place]. She asks multiple questions re: genetics.  She is planning on applying to enroll in a MSA-C MRI study involving a 7 Tesla magnet.  She will need to travel to [place] for this.  She has had some urinary incontinence, occasional.  No diplopia.  Walks with cane most of the time.  She also complains that food is getting "stuck" in the midepigastric region when  she is hungry or when she is tired.  This can consist of posterior, meat, or a salad.  She will have to walk around and stretch and sometimes will cough the entire bolus up whole.  She had a modified barium swallow in September of last year that was unremarkable.  06/13/15 update:  The patient is seen back today in follow-up in regard to her multiple system atrophy.  She is on carbidopa/levodopa 25/100, 1 tablet 4 times per day (8am/12pm/4pm/8pm).  She has had trouble tolerating increased dosages because of nausea, but we were previously unsure whether the nausea was from the levodopa or if it was from the Indocin.  Last visit, she stated that she was hopeful to discontinue the Indocin once she had her knee surgery.  She did have this on April 13 under a local block.  I reviewed those records.  She also saw GI since last visit.  I got a note from her gastroenterologist.  She had EGD which revealed evidence of gastric esophageal reflux disease, esophageal stenosis and a medium sized hiatal hernia.  She was started on Nexium, 40 mg twice a day. She states that she has another EGD on sept 10 to hopefully stretch the esophagus. .  She states that they couldn't stretch the esophagus because it was so ulcerated so placed on nexium first.  She is still having trouble with swallowing but thinks that is the esophagus issues.  She is on a soft diet.  Today, the patient states that she is having more cramping at night and more troubles with dexterity in her fingers.  Still having nausea despite off indocin; thinks from levodopa.  10/13/15 update:  The patient is following up today.  She is accompanied by her sister who supplements the history.  I have reviewed records since our last visit.  She has a history of multiple system atrophy.  She increased her carbidopa/levodopa 25/100, 1 tablet 6 times per day and she takes a CR 25/100 at night.  She has noted some rigidity on the right and some tremor on the right but only with  stress or when she is cold.  She thinks that the increase has helped.  She had an EGD in September and I got notes from that.  There were no lesions in the esophagus.  They did a dilation and she continued to have a medium sized hiatal hernia.  She had a left TKA done on 08/23/2015 and subsequently went to a subacute nursing facility for rehabilitation.  She has recovered nicely from that.  She denies any falls since our last visit.  No hallucinations.  No lightheadedness or near syncope.  She has noted a facial twitch on the right, intermittently.  She is having some dry skin.  Neuroimaging has previously been performed.  It is available for my review today.  PREVIOUS MEDICATIONS: Sinemet and Sinemet CR  ALLERGIES:   Allergies  Allergen Reactions  . Sulfa Antibiotics     Long time ago and does not remember reaction    CURRENT MEDICATIONS:  Current Outpatient Prescriptions on File Prior to Visit  Medication Sig Dispense Refill  . acetaminophen (TYLENOL) 500 MG tablet Take 1,000 mg by mouth every 4 (four) hours as needed for moderate pain.     . Carbidopa-Levodopa ER (SINEMET CR) 25-100 MG tablet controlled release Take 1 tablet by mouth at bedtime.    Marland Kitchen diltiazem (CARTIA XT) 240 MG 24 hr capsule Take 1 capsule (240 mg total) by mouth daily. 90 capsule 3  . esomeprazole (NEXIUM) 40 MG capsule Take 40 mg by mouth daily at 12 noon.    . ferrous sulfate 325 (65 FE) MG tablet Take 325 mg by mouth daily  with breakfast.    . furosemide (LASIX) 40 MG tablet Take 1 tablet (40 mg total) by mouth daily. 90 tablet 1  . losartan (COZAAR) 100 MG tablet Take 1 tablet (100 mg total) by mouth daily. 90 tablet 3   No current facility-administered medications on file prior to visit.    PAST MEDICAL HISTORY:   Past Medical History  Diagnosis Date  . Allergy   . Hypertension   . Anxiety   . Hyperlipidemia   . Arthritis     Left knee  . Multiple system atrophy C ([place])     a" progressive worsening  disorder, Type C- affecting more motor issues"  . Heart murmur   . Hx of seasonal allergies     occ. use OTC Mucinex or Antihistamine.  Marland Kitchen GERD (gastroesophageal reflux disease)   . History of hiatal hernia     small  . Anemia   . Neuromuscular disorder ([place])     "multi system atrophy disease"- "pain, bilateral foot numbness, left knee pain"- Dr. Carles Collet is following.  . Shoulder disorder     right shoulder "rigidity due to Multi system atrophy"- ROM not limited"just tires me"  . Edema extremities     lower extremity edema left greater than right.  . Mobility impaired     ambulates with walker, tendency to lose balance.    PAST SURGICAL HISTORY:   Past Surgical History  Procedure Laterality Date  . Foot surgery  2011    3rd metatarsal LT foot  . Tonsillectomy and adenoidectomy    . Knee arthroscopy Left 02/08/2015    Procedure: LEFT KNEE ARTHROSCOPY ;  Surgeon: Latanya Maudlin, MD;  Location: [facility];  Service: Orthopedics;  Laterality: Left;  . Esophagogastroduodenoscopy endoscopy      with esophageal dilation  . Total knee arthroplasty Left 08/22/2015    Procedure: LEFT TOTAL  KNEE  ARTHROPLASTY;  Surgeon: Latanya Maudlin, MD;  Location: [facility];  Service: Orthopedics;  Laterality: Left;    SOCIAL HISTORY:   Social History   Social History  . Marital Status: Divorced    Spouse Name: N/A  . Number of Children: 7  . Years of Education: 12   Occupational History  . ACTIVITY COORDINATOR    Social History Main Topics  . Smoking status: Former Smoker -- 1.00 packs/day for 12 years    Quit date: 03/14/1988  . Smokeless tobacco: Never Used  . Alcohol Use: No  . Drug Use: No  . Sexual Activity: Not on file   Other Topics Concern  . Not on file   Social History Narrative    FAMILY HISTORY:   Family Status  Relation Status Death Age  . Mother Deceased 87    diabetes, kidney failure, melanoma  . Father Deceased 64    COPD  . Brother Deceased     brain cancer  . Sister  Alive     healthy  . Daughter Alive     healthy  . Daughter Alive     healthy  . Daughter Alive     healthy  . Daughter Alive     healthy  . Daughter Alive     healthy  . Son Alive     healthy  . Son Alive     healthy    ROS:  A complete 10 system review of systems was obtained and was unremarkable apart from what is mentioned above.  PHYSICAL EXAMINATION:    VITALS:   Filed Vitals:  10/13/15 1309  BP: 114/70  Pulse: 80  Height: 5\' 7"  (1.702 m)  Weight: 209 lb (94.802 kg)   Wt Readings from Last 3 Encounters:  10/13/15 209 lb (94.802 kg)  10/06/15 210 lb 6.4 oz (95.437 kg)  08/22/15 213 lb 13.5 oz (97 kg)     GEN:  The patient appears stated age and is in NAD. HEENT:  Normocephalic, atraumatic.  The mucous membranes are moist. The superficial temporal arteries are without ropiness or tenderness. CV:  RRR w 3/6 sem Lungs:  CTAB Neck/HEME:  There are no carotid bruits bilaterally.  Neurological examination:  Orientation: The patient is alert and oriented x3.  Cranial nerves: There is good facial symmetry.   The visual fields are full to confrontational testing. The speech is fluent and clear. Soft palate rises symmetrically and there is no tongue deviation. Hearing is intact to conversational tone. Sensation: Sensation is intact to light touch throughout. Motor: Strength is 5/5 in the bilateral upper and lower extremities.   Shoulder shrug is equal and symmetric.  There is no pronator drift.   Movement examination: Tone: There is increased tone in the right upper extremity,  mild-moderate.  Tone in the RLE is normal.  The tone in the LUE/LLE is normal Abnormal movements: No tremor noted today and no dyskinesia today. Coordination:  She had mild decremation with rapid alternating movements on the right. Gait and Station: The patient gets out of the chair by pushing off with a walker.  She is more slurred than in the past, but she is still recovering from knee  surgery and her endurance is poor.  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      -She is now on carbidopa/levodopa 25/100, and has increased her dosage to one tablet 6 times per day in addition to carbidopa/levodopa 25/100 CR at bedtime overall, she thinks that the increase has helped.  -She is still in physical therapy since she just had her knee replaced.  We talked about goals of physical therapy.  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  I did not see this today, but I have seen similar things in patients who have had multiple system atrophy. 2.  Dry skin  -can be associated with the disease.  Ceravie and aquaphor recommended.   3.  Dysphagia.  -MBE was performed on 07/01/14 and it was normal and a regular, thin liquid diet was recommended.  -She is no longer on a soft diet (was because of ulcers from Indocin) and is now able to swallow and feels markedly better.  She has had a medium sized hiatal hernia.  They did a dilation of the esophagus last scope in September and she is feeling well. 3.  Knee pain.    -She had left TKA on 08/23/15 and is still recovering.  Initially, she thought she would need the right one done as well, but now she is thinking she will not.  She will continue in therapy. 4.   followup in the next few months, sooner should new neurologic issues arise.  Greater than 50% of the 40 minute visit spent in counseling.  She asked me again about prognosis and prognostic factors and we discussed these again in detail today to the best of my ability.

## 2015-10-14 ENCOUNTER — Encounter: Payer: Self-pay | Admitting: Family Medicine

## 2015-11-17 ENCOUNTER — Other Ambulatory Visit: Payer: Self-pay

## 2015-11-17 MED ORDER — FUROSEMIDE 40 MG PO TABS: 40.0000 mg | ORAL_TABLET | Freq: Every day | ORAL | Status: DC

## 2015-11-17 MED ORDER — DILTIAZEM HCL ER COATED BEADS 240 MG PO CP24: 240.0000 mg | ORAL_CAPSULE | Freq: Every day | ORAL | Status: DC

## 2016-02-12 ENCOUNTER — Ambulatory Visit: Payer: BLUE CROSS/BLUE SHIELD | Admitting: Neurology

## 2016-02-12 DIAGNOSIS — Z029 Encounter for administrative examinations, unspecified: Secondary | ICD-10-CM

## 2016-02-13 ENCOUNTER — Ambulatory Visit (INDEPENDENT_AMBULATORY_CARE_PROVIDER_SITE_OTHER): Payer: BLUE CROSS/BLUE SHIELD | Admitting: Neurology

## 2016-02-13 ENCOUNTER — Encounter: Payer: Self-pay | Admitting: Neurology

## 2016-02-13 VITALS — BP 100/62 | HR 82 | Ht 67.0 in | Wt 224.0 lb

## 2016-02-13 DIAGNOSIS — G518 Other disorders of facial nerve: Secondary | ICD-10-CM | POA: Diagnosis not present

## 2016-02-13 DIAGNOSIS — G239 Degenerative disease of basal ganglia, unspecified: Secondary | ICD-10-CM

## 2016-02-13 DIAGNOSIS — G5139 Clonic hemifacial spasm, unspecified: Secondary | ICD-10-CM

## 2016-02-13 DIAGNOSIS — R350 Frequency of micturition: Secondary | ICD-10-CM | POA: Diagnosis not present

## 2016-02-13 DIAGNOSIS — G903 Multi-system degeneration of the autonomic nervous system: Secondary | ICD-10-CM

## 2016-02-13 NOTE — Progress Notes (Signed)
Jacqueline Atkinson was seen today in the movement disorders clinic for neurologic consultation at the request of [name].   The patient presents today to the movement disorder clinic at [facility] neurolog[facility] for consultation regarding possible Parkinson's disease.  I had the opportunity to review records from [name], her prior neurologist.  It appears that the patient has been seeing [name] since 12/29/2013.  Patient reports that she just has not been the same ever since she had a fall in 2011.  Her R foot got caught in a stool and she fell.  She was subsequently placed under the care of orthopedic surgeons and intermittently would go to have fluid drained from her knee.   She developed a R leg drag.  She, therefore, initially attributed difficulty in walking to this injury.  She states that later on, she developed balance issues (cannot say how long that took to develop).  Over the course of time, however, she noticed decreased ability to use the right hand in order to write and began to use the left hand for more tasks.  She stated that she would have to reach across her body to use the mouse (noted that as of Christmas, 2014).  In Feb, she was at work and had a bad HA and then had a CT and subsequent MRI of the brain as they saw a meningioma and so she was referred to [name].  She saw [name] on 12/29/2013.  About a week later on 01/06/2014 she followed up with [name] who had recommended an EMG and this was done and was normal.  On 01/17/2014 [name] started her on levodopa and referred her for another opinion to [facility].  She has not yet had that appointment, but it is scheduled for 06/15/2014.  Pt states that when she first got to the pharmacy, there was a RX there for both and IR and CR tablet (she now thinks that it was accident)  She only picked up the CR and took it bid and stated that it helped her feel "looser" and more steady.  When she f/u with [name], she realized that she was supposed to be on  the IR but when she tried that, she had a lot of nausea and even emesis (tried it with carbs even but initially started with 2 po tid; then went to 1 po 6 times per day and didn't have as much emesis but still had to have the carbs with it and she didn't like all the carb intake).  Therefore, she went back to the CR version on her own and she is only on one tablet at 8am/8pm. She presents today for another opinion.  06/24/14 update:  Pt presents for f/u.  DaT scan was done yesterday.  There was significant reduction in uptake of the radiotracer in the bilateral putamen with activity confined to the caudate.  Pt remains on carbidopa/levodopa 25/100 CR at 8am/8pm.  She has been having more knee pain and went to [place] [facility].  She was told that she needs an MRI but she decided that she didn't want to do an MRI if she wasn't going to have surgery and she didn't want to consider that.  She was given klonopin for insomnia since last visit.  She rarely takes it and said that she only took it a few times.  One of the reasons for insomnia is stiffness associated with the back pain.  Klonopin does help her sleep  when she takes it.  She had one fall since last visit; she was going up the steps and her knee buckled and fell.  She is not sure if her knee caused her to fall.  She is exercising; she is doing yoga twice per week.  She is now able to get off of the floor which she didn't used to be able to do.  She is getting ready to start the [facility] exercise class.   09/26/14 update:  Pt returns today for f/u.  I was trying to transition her slowly to the IR formulation of levodopa, which she has had trouble tolerating in the past.  Last visit, I asked her to try to take carbidopa/levodopa 25/100 CR at 8am and 8pm and carbidopa/levodopa 25/100 IR at noon.  She actually moved up the dosage on her own and she is taking carbidopa/levodopa 25/100, 2 at 8am, 2 at noon, 2 at 4pm, and 2 at 8pm.  She may miss one of those dosages and may  only get in 6 pills per day.  She gets really nauseated after 10 minutes but then it gets better if she can get throught that 10 minutes.   MBE was performed on 07/01/14 and it was normal and a regular, thin liquid diet was recommended.  She did got to UC after a fall on knee on 09/13/14. She needs a knee surgery but had to wait until insurance changes.   She is exercising; she is doing yoga twice per week.  C/o a pain in the mid thoracic region that wakes her up in the middle of the night.  Lidoderm has helped but it was very expensive.  Reviewed MRI report from [facility] in 12/2013 and it was normal.  Having more urinary incontinence.  Mood is really good; feels like she is at best place that she has been in virtually all her life.  02/06/15 update:  Pt is accompanied by her sister who supplements the history.  The patient has a history of multiple system atrophy, cerebellar type.  She is on carbidopa/levodopa 25/100.  She is supposed to be on 2 tablets by mouth 4 times per day that she seems to have nausea and emesis when she takes it that way, so she is only taking 1 tablet by mouth 4 times per day.  She is also on Indocin twice a day and is hoping to get off of that after her knee surgery.  She is having knee surgery on Wednesday, which she thinks is the primary etiology for some of her gait issues.  She has been awaiting this knee surgery for a long time.  Her orthopedic surgeon did tell her that he was not sure that it was going to be able to help all of her knee issues, including her gait, however.  No falls.  She went to an ataxia conference in [place]. She asks multiple questions re: genetics.  She is planning on applying to enroll in a MSA-C MRI study involving a 7 Tesla magnet.  She will need to travel to [place] for this.  She has had some urinary incontinence, occasional.  No diplopia.  Walks with cane most of the time.  She also complains that food is getting "stuck" in the midepigastric region when  she is hungry or when she is tired.  This can consist of posterior, meat, or a salad.  She will have to walk around and stretch and sometimes will cough the entire bolus up whole.  She had a modified barium swallow in September of last year that was unremarkable.  06/13/15 update:  The patient is seen back today in follow-up in regard to her multiple system atrophy.  She is on carbidopa/levodopa 25/100, 1 tablet 4 times per day (8am/12pm/4pm/8pm).  She has had trouble tolerating increased dosages because of nausea, but we were previously unsure whether the nausea was from the levodopa or if it was from the Indocin.  Last visit, she stated that she was hopeful to discontinue the Indocin once she had her knee surgery.  She did have this on April 13 under a local block.  I reviewed those records.  She also saw GI since last visit.  I got a note from her gastroenterologist.  She had EGD which revealed evidence of gastric esophageal reflux disease, esophageal stenosis and a medium sized hiatal hernia.  She was started on Nexium, 40 mg twice a day. She states that she has another EGD on sept 10 to hopefully stretch the esophagus. .  She states that they couldn't stretch the esophagus because it was so ulcerated so placed on nexium first.  She is still having trouble with swallowing but thinks that is the esophagus issues.  She is on a soft diet.  Today, the patient states that she is having more cramping at night and more troubles with dexterity in her fingers.  Still having nausea despite off indocin; thinks from levodopa.  10/13/15 update:  The patient is following up today.  She is accompanied by her sister who supplements the history.  I have reviewed records since our last visit.  She has a history of multiple system atrophy.  She increased her carbidopa/levodopa 25/100, 1 tablet 6 times per day and she takes a CR 25/100 at night.  She has noted some rigidity on the right and some tremor on the right but only with  stress or when she is cold.  She thinks that the increase has helped.  She had an EGD in September and I got notes from that.  There were no lesions in the esophagus.  They did a dilation and she continued to have a medium sized hiatal hernia.  She had a left TKA done on 08/23/2015 and subsequently went to a subacute nursing facility for rehabilitation.  She has recovered nicely from that.  She denies any falls since our last visit.  No hallucinations.  No lightheadedness or near syncope.  She has noted a facial twitch on the right, intermittently.  She is having some dry skin.  02/13/16 update:  The patient is following up today.  She is on carbidopa/levodopa 25/100, one tablet 6 times per day (8am/10/2/4/6/8).  She takes carbidopa/levodopa 50/200 CR at bedtime (9-10 pm).  Overall, she feels that she has been stable and doing fairly well.  She denies falls.  She has not had any lightheadedness or near syncope.  No hallucinations.  Little more twitching of th right face.  Last MRI was 10/2013 and that was without gad and meningioma in the L middle cranial fossa.  Noticed that carpets that are "busy" she will lose depth perception.  Having urinary frequency.  Is on diuretic but takes it in the AM.  Asks me to put in DNR orders. States that she has discussed with her daughter and both agree.  Daughter is Marine scientist.  Is working on Systems analyst.  Neuroimaging has previously been performed.  It is available for my review today.  PREVIOUS MEDICATIONS: Sinemet  and Sinemet CR  ALLERGIES:   Allergies  Allergen Reactions  . Sulfa Antibiotics     Long time ago and does not remember reaction    CURRENT MEDICATIONS:  Current Outpatient Prescriptions on File Prior to Visit  Medication Sig Dispense Refill  . acetaminophen (TYLENOL) 500 MG tablet Take 1,000 mg by mouth every 4 (four) hours as needed for moderate pain.     Marland Kitchen b complex vitamins tablet Take 1 tablet by mouth daily.    . carbidopa-levodopa  (SINEMET IR) 25-100 MG tablet Take 1 tablet 6 times daily 540 tablet 1  . Cholecalciferol (VITAMIN D-3 PO) Take 4,000 Units by mouth daily.    Marland Kitchen diltiazem (CARTIA XT) 240 MG 24 hr capsule Take 1 capsule (240 mg total) by mouth daily. 90 capsule 3  . EQ ASPIRIN 325 MG EC tablet Take 325 mg by mouth daily.   0  . esomeprazole (NEXIUM) 40 MG capsule Take 40 mg by mouth daily at 12 noon.    . ferrous sulfate 325 (65 FE) MG tablet Take 325 mg by mouth daily with breakfast.    . furosemide (LASIX) 40 MG tablet Take 1 tablet (40 mg total) by mouth daily. 90 tablet 1  . losartan (COZAAR) 100 MG tablet Take 1 tablet (100 mg total) by mouth daily. 90 tablet 3  . Omega-3 Fatty Acids (FISH OIL) 1200 MG CAPS Take by mouth daily.    . Red Yeast Rice Extract (RED YEAST RICE PO) Take 1,200 mg by mouth daily.    . vitamin B-12 (CYANOCOBALAMIN) 1000 MCG tablet Take 1,000 mcg by mouth daily.    . vitamin C (ASCORBIC ACID) 500 MG tablet Take 500 mg by mouth daily.     No current facility-administered medications on file prior to visit.    PAST MEDICAL HISTORY:   Past Medical History  Diagnosis Date  . Allergy   . Hypertension   . Anxiety   . Hyperlipidemia   . Arthritis     Left knee  . Multiple system atrophy C ([place])     a" progressive worsening disorder, Type C- affecting more motor issues"  . Heart murmur   . Hx of seasonal allergies     occ. use OTC Mucinex or Antihistamine.  Marland Kitchen GERD (gastroesophageal reflux disease)   . History of hiatal hernia     small  . Anemia   . Neuromuscular disorder ([place])     "multi system atrophy disease"- "pain, bilateral foot numbness, left knee pain"- Dr. Carles Collet is following.  . Shoulder disorder     right shoulder "rigidity due to Multi system atrophy"- ROM not limited"just tires me"  . Edema extremities     lower extremity edema left greater than right.  . Mobility impaired     ambulates with walker, tendency to lose balance.    PAST SURGICAL HISTORY:   Past  Surgical History  Procedure Laterality Date  . Foot surgery  2011    3rd metatarsal LT foot  . Tonsillectomy and adenoidectomy    . Knee arthroscopy Left 02/08/2015    Procedure: LEFT KNEE ARTHROSCOPY ;  Surgeon: Latanya Maudlin, MD;  Location: [facility];  Service: Orthopedics;  Laterality: Left;  . Esophagogastroduodenoscopy endoscopy      with esophageal dilation  . Total knee arthroplasty Left 08/22/2015    Procedure: LEFT TOTAL  KNEE  ARTHROPLASTY;  Surgeon: Latanya Maudlin, MD;  Location: [facility];  Service: Orthopedics;  Laterality: Left;    SOCIAL HISTORY:  Social History   Social History  . Marital Status: Divorced    Spouse Name: N/A  . Number of Children: 7  . Years of Education: 12   Occupational History  . ACTIVITY COORDINATOR    Social History Main Topics  . Smoking status: Former Smoker -- 1.00 packs/day for 12 years    Quit date: 03/14/1988  . Smokeless tobacco: Never Used  . Alcohol Use: No  . Drug Use: No  . Sexual Activity: Not on file   Other Topics Concern  . Not on file   Social History Narrative    FAMILY HISTORY:   Family Status  Relation Status Death Age  . Mother Deceased 22    diabetes, kidney failure, melanoma  . Father Deceased 14    COPD  . Brother Deceased     brain cancer  . Sister Alive     healthy  . Daughter Alive     healthy  . Daughter Alive     healthy  . Daughter Alive     healthy  . Daughter Alive     healthy  . Daughter Alive     healthy  . Son Alive     healthy  . Son Alive     healthy    ROS:  A complete 10 system review of systems was obtained and was unremarkable apart from what is mentioned above.  PHYSICAL EXAMINATION:    VITALS:   Filed Vitals:   02/13/16 1341  BP: 100/62  Pulse: 82  Height: 5\' 7"  (1.702 m)  Weight: 224 lb (101.606 kg)   Wt Readings from Last 3 Encounters:  02/13/16 224 lb (101.606 kg)  10/13/15 209 lb (94.802 kg)  10/06/15 210 lb 6.4 oz (95.437 kg)     GEN:  The patient  appears stated age and is in NAD. HEENT:  Normocephalic, atraumatic.  The mucous membranes are moist. The superficial temporal arteries are without ropiness or tenderness. CV:  RRR w 3/6 sem Lungs:  CTAB Neck/HEME:  There are no carotid bruits bilaterally.  Neurological examination:  Orientation: The patient is alert and oriented x3.  Cranial nerves: There is good facial symmetry.   The visual fields are full to confrontational testing. The speech is fluent and clear. Soft palate rises symmetrically and there is no tongue deviation. Hearing is intact to conversational tone. Sensation: Sensation is intact to light touch throughout. Motor: Strength is 5/5 in the bilateral upper and lower extremities.   Shoulder shrug is equal and symmetric.  There is no pronator drift.   Movement examination: Tone: There is increased tone in the right upper extremity,  mild-moderate.  Tone in the RLE is normal.  The tone in the LUE/LLE is normal Abnormal movements: No tremor noted today and no dyskinesia today.  There is occasional spasm around the right eye. Coordination:  She had mild decremation with rapid alternating movements on the right. Gait and Station: The patient gets out of the chair by pushing off the chair.  She walks with 2 canes.  Her endurance remains poor.  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      -She is now on carbidopa/levodopa 25/100,  one tablet 6 times per day in addition to carbidopa/levodopa 50/200 CR at bedtime overall.  She is having trouble turning over in bed at night.  We talked about potentially switching to Rytary, but ultimately decided that added little benefit.  -We talked about ways that  she could get in safe, cardiovascular exercise without worsening her knee.  -told her to take BP and watch it closely as wonder if still needs all this medication.  If not, perhaps we could target lasix first as having urinary frequency and decreased energy.   F/u with PCP about this.    -She wanted to be made a DO NOT RESUSCITATE.  She understands fully what she is doing and I talked to her extensively about this decision.  She understands that this does not mean she will not be treated.  She has talked about this decision with her daughter. 2.  Hemifacial spasm, right  -I have seen this in patients with multiple system atrophy, but we'll do an MRI of the brain with and without gadolinium to make sure we are not missing anything.  She does have a history of meningioma, but that was within the left middle cranial fossa.  -Talked about Botox, along with its risks and benefits, including the black box warning.  She wants to hold on that for right now. 3.  Dysphagia.  -MBE was performed on 07/01/14 and it was normal and a regular, thin liquid diet was recommended.  -She is no longer on a soft diet (was because of ulcers from Indocin) and is now able to swallow and feels markedly better.  She has had a medium sized hiatal hernia.  They did a dilation of the esophagus last scope in September and she is feeling well. 3.  Knee pain.    -She had left TKA on 08/23/15 and is still recovering.  Initially, she thought she would need the right one done as well, but now she is thinking she will not.   4.   followup in the next few months, sooner should new neurologic issues arise.  Greater than 50% of the 55 minute visit spent in counseling.

## 2016-02-13 NOTE — Patient Instructions (Signed)
1. Keep an eye on your blood pressure. 2. We have scheduled you at [facility] for your MRI on 02/20/16 at 5:00 pm. Please arrive 15 minutes prior and go to 1st floor radiology. If you need to reschedule for any reason please call [phone].

## 2016-02-20 ENCOUNTER — Ambulatory Visit (HOSPITAL_COMMUNITY): Payer: BLUE CROSS/BLUE SHIELD

## 2016-02-24 ENCOUNTER — Other Ambulatory Visit: Payer: Self-pay | Admitting: Family Medicine

## 2016-02-28 ENCOUNTER — Ambulatory Visit (HOSPITAL_COMMUNITY)
Admission: RE | Admit: 2016-02-28 | Discharge: 2016-02-28 | Disposition: A | Discharge status: Home / Self Care | Payer: BLUE CROSS/BLUE SHIELD | Source: Ambulatory Visit | Attending: Neurology | Admitting: Neurology

## 2016-02-28 DIAGNOSIS — D320 Benign neoplasm of cerebral meninges: Secondary | ICD-10-CM | POA: Diagnosis not present

## 2016-02-28 DIAGNOSIS — G239 Degenerative disease of basal ganglia, unspecified: Secondary | ICD-10-CM | POA: Insufficient documentation

## 2016-02-28 DIAGNOSIS — G903 Multi-system degeneration of the autonomic nervous system: Secondary | ICD-10-CM

## 2016-02-28 LAB — CREATININE, SERUM
CREATININE: 0.82 mg/dL (ref 0.44–1.00)
GFR calc Af Amer: >60 mL/min (ref >60)

## 2016-02-28 MED ORDER — GADOBENATE DIMEGLUMINE 529 MG/ML IV SOLN
20.0000 mL | Freq: Once | INTRAVENOUS | Status: AC | PRN
  Administered 2016-02-28: 20 mL via INTRAVENOUS

## 2016-02-29 ENCOUNTER — Telehealth: Payer: Self-pay | Admitting: Neurology

## 2016-02-29 NOTE — Telephone Encounter (Signed)
Patient made aware of MR results.  

## 2016-02-29 NOTE — Telephone Encounter (Signed)
-----   Message from Pieter Partridge, DO sent at 02/29/2016  7:15 AM EDT ----- MRI of brain shows known meningioma, which is stable.  But there are no abnormalities to explain the hemifacial spasm.

## 2016-03-07 ENCOUNTER — Telehealth: Payer: Self-pay

## 2016-03-07 NOTE — Telephone Encounter (Signed)
[facility] [facility]   [facility] three blood pressure medications.  She is out

## 2016-03-08 ENCOUNTER — Other Ambulatory Visit: Payer: Self-pay | Admitting: Family Medicine

## 2016-03-08 ENCOUNTER — Telehealth: Payer: Self-pay | Admitting: Emergency Medicine

## 2016-03-08 MED ORDER — DILTIAZEM HCL ER COATED BEADS 240 MG PO CP24: 240.0000 mg | ORAL_CAPSULE | Freq: Every day | ORAL | Status: DC

## 2016-03-08 MED ORDER — FUROSEMIDE 40 MG PO TABS: 40.0000 mg | ORAL_TABLET | Freq: Every day | ORAL | Status: DC

## 2016-03-08 MED ORDER — LOSARTAN POTASSIUM 100 MG PO TABS
100.0000 mg | ORAL_TABLET | Freq: Every day | ORAL | Status: DC
Start: 2016-03-08 — End: 2016-04-23

## 2016-03-08 MED ORDER — LOSARTAN POTASSIUM 100 MG PO TABS: 100.0000 mg | ORAL_TABLET | Freq: Every day | ORAL | Status: DC

## 2016-03-08 NOTE — Telephone Encounter (Addendum)
Pt was prescribed a yrs refill on her losartan and diltiazem in Dec so she should not be out - maybe the prior are still on file but under a different rx number? Or did she change pharmacies? I cancelled the yr long refills that were sent in and changed to just 6 mos of refill on losartan and dilt. I have not seen pt in 6 mos and she has not had lab work so I do not feel comfortable refilling her lasix without a potassium check - I changed lasix to 90d with no refill to give pt time to get in for a bp check and bmp. If she wants to have her neurologist check her basic metabolic panel at her next appt their and then call for additional lasix refills, that'd be fine as well.  Also, it looks like pt was hypotensive at her last neurology visit - she is already at enough fall risk from the multiple systems atrophy so we do need to be cautious about over-treating her BP. Is she checking her BP at home?  What is it? Any lightheadedness with sudden position change?

## 2016-03-08 NOTE — Telephone Encounter (Signed)
Pt called in requesting medication refills on Losartan, Furosemide and Diltiazem. States, she and the pharmacy has made several calls to get medication and she needs them now Medications reordered and e-scribed to [facility].

## 2016-03-11 NOTE — Telephone Encounter (Signed)
Spoke with patient and informed her that refills were sent to her pharmacy, however she must be seen for an further refills. Patient agreed but states that she will have to call back to schedule appointment. I also called and check [facility] to make sure they saw the refill corrections and spoke with Almyra Free the Pharmacist and refills were changed. Jp/cma

## 2016-04-23 ENCOUNTER — Ambulatory Visit (INDEPENDENT_AMBULATORY_CARE_PROVIDER_SITE_OTHER): Payer: BLUE CROSS/BLUE SHIELD | Admitting: Family Medicine

## 2016-04-23 VITALS — BP 122/78 | HR 78 | Temp 98.1°F | Resp 18 | Ht 67.0 in | Wt 231.4 lb

## 2016-04-23 DIAGNOSIS — I10 Essential (primary) hypertension: Secondary | ICD-10-CM | POA: Diagnosis not present

## 2016-04-23 DIAGNOSIS — M1990 Unspecified osteoarthritis, unspecified site: Secondary | ICD-10-CM | POA: Insufficient documentation

## 2016-04-23 DIAGNOSIS — E785 Hyperlipidemia, unspecified: Secondary | ICD-10-CM | POA: Diagnosis not present

## 2016-04-23 DIAGNOSIS — M609 Myositis, unspecified: Secondary | ICD-10-CM | POA: Diagnosis not present

## 2016-04-23 DIAGNOSIS — K219 Gastro-esophageal reflux disease without esophagitis: Secondary | ICD-10-CM | POA: Diagnosis not present

## 2016-04-23 DIAGNOSIS — Z79899 Other long term (current) drug therapy: Secondary | ICD-10-CM

## 2016-04-23 DIAGNOSIS — G903 Multi-system degeneration of the autonomic nervous system: Secondary | ICD-10-CM

## 2016-04-23 DIAGNOSIS — M791 Myalgia: Secondary | ICD-10-CM | POA: Diagnosis not present

## 2016-04-23 DIAGNOSIS — IMO0001 Reserved for inherently not codable concepts without codable children: Secondary | ICD-10-CM

## 2016-04-23 DIAGNOSIS — E569 Vitamin deficiency, unspecified: Secondary | ICD-10-CM

## 2016-04-23 DIAGNOSIS — G238 Other specified degenerative diseases of basal ganglia: Secondary | ICD-10-CM

## 2016-04-23 DIAGNOSIS — D62 Acute posthemorrhagic anemia: Secondary | ICD-10-CM

## 2016-04-23 LAB — COMPREHENSIVE METABOLIC PANEL
ALBUMIN: 4.4 g/dL (ref 3.6–5.1)
ALT: 9 U/L (ref 6–29)
AST: 16 U/L (ref 10–35)
Alkaline Phosphatase: 126 U/L (ref 33–130)
BUN: 13 mg/dL (ref 7–25)
CALCIUM: 9.4 mg/dL (ref 8.6–10.4)
CO2: 25 mmol/L (ref 20–31)
CREATININE: 0.91 mg/dL (ref 0.50–0.99)
Chloride: 102 mmol/L (ref 98–110)
GLUCOSE: 89 mg/dL (ref 65–99)
Potassium: 4.3 mmol/L (ref 3.5–5.3)
SODIUM: 140 mmol/L (ref 135–146)
TOTAL PROTEIN: 7 g/dL (ref 6.1–8.1)
Total Bilirubin: 0.4 mg/dL (ref 0.2–1.2)

## 2016-04-23 LAB — CBC
HEMATOCRIT: 40.6 % (ref 35.0–45.0)
Hemoglobin: 14.1 g/dL (ref 11.7–15.5)
MCH: 29.8 pg (ref 27.0–33.0)
MCHC: 34.7 g/dL (ref 32.0–36.0)
MCV: 85.8 fL (ref 80.0–100.0)
MPV: 11 fL (ref 7.5–12.5)
Platelets: 227 10*3/uL (ref 140–400)
RBC: 4.73 MIL/uL (ref 3.80–5.10)
RDW: 14.7 % (ref 11.0–15.0)
WBC: 8.1 10*3/uL (ref 3.8–10.8)

## 2016-04-23 LAB — TSH: TSH: 0.86 m[IU]/L

## 2016-04-23 LAB — VITAMIN B12: VITAMIN B 12: 875 pg/mL (ref 200–1100)

## 2016-04-23 LAB — FERRITIN: Ferritin: 50 ng/mL (ref 20–288)

## 2016-04-23 LAB — LIPID PANEL
Cholesterol: 271 mg/dL — ABNORMAL HIGH (ref 125–200)
HDL: 56 mg/dL
LDL Cholesterol: 172 mg/dL — ABNORMAL HIGH
Total CHOL/HDL Ratio: 4.8 ratio
Triglycerides: 213 mg/dL — ABNORMAL HIGH
VLDL: 43 mg/dL — ABNORMAL HIGH

## 2016-04-23 MED ORDER — OMEPRAZOLE 40 MG PO CPDR: 40.0000 mg | DELAYED_RELEASE_CAPSULE | Freq: Every day | ORAL | Status: DC

## 2016-04-23 MED ORDER — POTASSIUM CHLORIDE CRYS ER 20 MEQ PO TBCR: 20.0000 meq | EXTENDED_RELEASE_TABLET | Freq: Every day | ORAL | Status: DC

## 2016-04-23 MED ORDER — DILTIAZEM HCL ER COATED BEADS 240 MG PO CP24: 240.0000 mg | ORAL_CAPSULE | Freq: Every day | ORAL | Status: DC

## 2016-04-23 MED ORDER — LOSARTAN POTASSIUM 100 MG PO TABS: 100.0000 mg | ORAL_TABLET | Freq: Every day | ORAL | Status: DC

## 2016-04-23 MED ORDER — FUROSEMIDE 40 MG PO TABS: 40.0000 mg | ORAL_TABLET | Freq: Every day | ORAL | Status: DC

## 2016-04-23 MED ORDER — CYCLOBENZAPRINE HCL 10 MG PO TABS: 10.0000 mg | ORAL_TABLET | Freq: Every day | ORAL | Status: DC

## 2016-04-23 NOTE — Progress Notes (Signed)
By signing my name below, I, Mesha Guinyard, attest that this documentation has been prepared under the direction and in the presence of [name], MD.  Electronically Signed: Verlee Monte, Medical Scribe. 04/23/2016. 8:34 AM.  Subjective:    Patient ID: Jacqueline Atkinson, female    DOB: [birth year], 60 y.o.   MRN: [id number]  HPI Chief Complaint  Patient presents with  . Medication Refill    for blood pressure medications and potassium level check    HPI Comments: Jacqueline Atkinson is a 60 y.o. female with a PMHx of multiple system atrophy-type C and HTN who presents to the [facility] for a medication refill. Pt states her bp has been running well at home. Pt denies experiencing side affects from her medications. Pt is still taking 325 mg of iron QD. Pt is still taking fish oil and the red yeast rice pills. Pt is taking 7 pills of Sinemet a day. Pt is still taking her B12, 4000 units Vit D. Pt's heartburn is controlled with taking Nexium before bed. Pt has a little problem with her heartburn but it's not unbearable. Pt stopped taking her potassium because it taste horrible, then went back on it for 3 weeks now. Pt is taking half a potassium pill now since eating a banana wasn't preventing her from getting leg cramps. Pt has been complaining that Lasix increases her urination after 2 hours of taking it. She notices she still has lower extremity edema even upon waking- top of feet gets puffy and painful to walk. Pt has new compression socks, but it's too difficult for her to put it on. She'll wear loose socks and shoes instead.  Pt states her muscles have been on a decline. Pt saw Dr. Carles Collet in May and was told about a new medication that insurance won't cover. Pt now has a twitch in her face and was recommended Botox by Dr. Carles Collet, but she doesn't want to experience the side affects of Botox, so she declined- she was told it can affect her swallowing. She wouldn't get Botox around her mouth, but  around her eyes. Pt has a muscle rigidity in her right shoulder and her right foot is beginning to drag. Pt is losing some of her fine motor dexterity. Pt can no longer hold a paint brush for too long because her hands cramp, and can't hold the position right. Pt has a fear that she'll loose her ability to paint. When she paints she uses both hands and she'll sit up straight. Pt uses her left hand more than her right hand, but tries to get her right hand more involved. Pt's left knee has a hard time getting straight. Pt reports painful crepitus in her left knee.  Pt looses her balance in the dark and she has to hold on to something to feel grounded. Pt states the geometric carpets at the theaters makes her loose her depth perception and she has to look at someone's back to keep focus. Pt states she see's things but it's not registering in her brain. Pt's house feels safe to her, her bathroom has pull bars and everything is on one level. Pt states there are 2 raised threshholds in her house and there are a couple of rugs. She thinks she has to loose her rugs because her tires get stuck on it and she occasionally trips over it. Pt has done occupational therapy a couple of times. Pt doesn't realize something is happening to her body unless  she is paying attention to it. Pt states her right arm is like her [employer] arm because it ends of moving up around her sholder  Pt has a bike, step machine, and a resistant band for exercise but she doesn't have the discipline to exercise. Pt states when she went to her daughters house for 10 days, she kept putting off exercise for the next day until she eventually stopped. Pt also has a gym membership but doesn't go as often because getting dressed takes a lot of energy. Pt knows if she exercises she will start getting more energy in the day. Pt plans on getting in the habit of exercising. Pt states she doesn't have enough energy in the day- she's not sleepy tired, but she feels  physically drained. Pt states she doesn't nap well. Pt states she sleeps fine. Pt reports waking up feeling "tight". She has to wake up to change positions because she can't roll over- she has to sit up to change positions. Pt thinks its due to her staying in the same position for a long time. If she watches one movie she'll be fine, but if she's sits to watch 2 movies she'll feel the tightness. Pt hasn't been taking her muscle relaxer because she doesn't think it's not doing anything for her.   Patient Active Problem List   Diagnosis Date Noted  . Multiple system atrophy C ([place]) 10/06/2015  . Postoperative anemia due to acute blood loss 08/29/2015  . 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 Neuromuscular disease ([place]) 09/26/2014  . Gait difficulty 12/29/2013  . Back pain 12/29/2013  . Paresthesia 12/19/2012  . Hypertension 11/28/2012  . Polypharmacy 11/28/2012  . GERD (gastroesophageal reflux disease) 11/28/2012   Past Medical History  Diagnosis Date  . Allergy   . Hypertension   . Anxiety   . Hyperlipidemia   . Arthritis     Left knee  . Multiple system atrophy C ([place])     a" progressive worsening disorder, Type C- affecting more motor issues"  . Heart murmur   . Hx of seasonal allergies     occ. use OTC Mucinex or Antihistamine.  Marland Kitchen GERD (gastroesophageal reflux disease)   . History of hiatal hernia     small  . Anemia   . Neuromuscular disorder ([place])     "multi system atrophy disease"- "pain, bilateral foot numbness, left knee pain"- Dr. Carles Collet is following.  . Shoulder disorder     right shoulder "rigidity due to Multi system atrophy"- ROM not limited"just tires me"  . Edema extremities     lower extremity edema left greater than right.  . Mobility impaired     ambulates with walker, tendency to lose balance.   Past Surgical History  Procedure Laterality Date  . Foot surgery  2011    3rd metatarsal LT foot  . Tonsillectomy and adenoidectomy    . Knee arthroscopy  Left 02/08/2015    Procedure: LEFT KNEE ARTHROSCOPY ;  Surgeon: Latanya Maudlin, MD;  Location: [facility];  Service: Orthopedics;  Laterality: Left;  . Esophagogastroduodenoscopy endoscopy      with esophageal dilation  . Total knee arthroplasty Left 08/22/2015    Procedure: LEFT TOTAL  KNEE  ARTHROPLASTY;  Surgeon: Latanya Maudlin, MD;  Location: [facility];  Service: Orthopedics;  Laterality: Left;   Allergies  Allergen Reactions  . Sulfa Antibiotics     Long time ago and does not remember reaction   Prior to Admission medications  Medication Sig Start Date End Date Taking? Authorizing Provider  acetaminophen (TYLENOL) 500 MG tablet Take 1,000 mg by mouth every 4 (four) hours as needed for moderate pain.     Historical Provider, MD  b complex vitamins tablet Take 1 tablet by mouth daily.    Historical Provider, MD  carbidopa-levodopa (SINEMET CR) 50-200 MG tablet Take 1 tablet by mouth at bedtime.    Historical Provider, MD  carbidopa-levodopa (SINEMET IR) 25-100 MG tablet Take 1 tablet 6 times daily 10/13/15   Eustace Quail Tat, DO  Cholecalciferol (VITAMIN D-3 PO) Take 4,000 Units by mouth daily.    Historical Provider, MD  diltiazem (CARTIA XT) 240 MG 24 hr capsule Take 1 capsule (240 mg total) by mouth daily. 03/08/16   Shawnee Knapp, MD  EQ ASPIRIN 325 MG EC tablet Take 325 mg by mouth daily.  09/05/15   Historical Provider, MD  esomeprazole (NEXIUM) 40 MG capsule Take 40 mg by mouth daily at 12 noon.    Historical Provider, MD  ferrous sulfate 325 (65 FE) MG tablet Take 325 mg by mouth daily with breakfast.    Historical Provider, MD  furosemide (LASIX) 40 MG tablet Take 1 tablet (40 mg total) by mouth daily. Needs K checked before additional refills 03/08/16   Shawnee Knapp, MD  losartan (COZAAR) 100 MG tablet Take 1 tablet (100 mg total) by mouth daily. 03/08/16   Shawnee Knapp, MD  methocarbamol (ROBAXIN) 500 MG tablet Take 500 mg by mouth daily.    Historical Provider, MD  Omega-3 Fatty Acids (FISH OIL)  1200 MG CAPS Take by mouth daily.    Historical Provider, MD  omeprazole (PRILOSEC) 40 MG capsule Take 40 mg by mouth daily.    Historical Provider, MD  Red Yeast Rice Extract (RED YEAST RICE PO) Take 1,200 mg by mouth daily.    Historical Provider, MD  vitamin B-12 (CYANOCOBALAMIN) 1000 MCG tablet Take 1,000 mcg by mouth daily.    Historical Provider, MD  vitamin C (ASCORBIC ACID) 500 MG tablet Take 500 mg by mouth daily.    Historical Provider, MD   Social History   Social History  . Marital Status: Divorced    Spouse Name: N/A  . Number of Children: 7  . Years of Education: 12   Occupational History  . ACTIVITY COORDINATOR    Social History Main Topics  . Smoking status: Former Smoker -- 1.00 packs/day for 12 years    Quit date: 03/14/1988  . Smokeless tobacco: Never Used  . Alcohol Use: No  . Drug Use: No  . Sexual Activity: Not on file   Other Topics Concern  . Not on file   Social History Narrative   Depression screen [facility] 2/9 04/23/2016 10/06/2015 07/24/2015 04/21/2015 01/20/2015  Decreased Interest 0 0 0 0 0  Down, Depressed, Hopeless 0 0 0 0 0  PHQ - 2 Score 0 0 0 0 0   Review of Systems  Constitutional: Positive for activity change.  Musculoskeletal: Positive for myalgias, joint swelling, arthralgias and gait problem.  Neurological: Positive for weakness and numbness.  Psychiatric/Behavioral: Positive for sleep disturbance.   Objective:  BP 122/78 mmHg  Pulse 78  Temp(Src) 98.1 F (36.7 C) (Oral)  Resp 18  Ht 5\' 7"  (1.702 m)  Wt 231 lb 6.4 oz (104.962 kg)  BMI 36.23 kg/m2  SpO2 98%  Physical Exam  Constitutional: She appears well-developed and well-nourished. No distress.  HENT:  Head: Normocephalic and atraumatic.  Eyes: Conjunctivae are normal.  Neck: Neck supple.  Cardiovascular: Normal rate and regular rhythm.  Exam reveals no gallop and no friction rub.   Murmur heard.  Systolic murmur is present with a grade of 2/6  Pulmonary/Chest: Effort normal  and breath sounds normal. No respiratory distress. She has no wheezes. She has no rales.  Clear to auscultation bilaterally  Musculoskeletal:  1+ nonpitting edema in bilateral lower edema to mid calf  Neurological: She is alert.  Skin: Skin is warm and dry.  Psychiatric: She has a normal mood and affect. Her behavior is normal.  Nursing note and vitals reviewed.  Assessment & Plan:   1. Multiple system atrophy C ([place])   2. Essential hypertension   3. Gastroesophageal reflux disease without esophagitis   4. Hyperlipidemia   5. Arthritis   6. Postoperative anemia due to acute blood loss   7. Polypharmacy   8. Vitamin deficiency   9. Myalgia and myositis     Orders Placed This Encounter  Procedures  . Comprehensive metabolic panel    Order Specific Question:  Has the patient fasted?    Answer:  Yes  . Lipid panel    Order Specific Question:  Has the patient fasted?    Answer:  Yes  . VITAMIN D 25 Hydroxy (Vit-D Deficiency, Fractures)  . Vitamin B12  . CBC  . Ferritin  . TSH  . Care order/instruction    AVS and GO after labs are drawn    Scheduling Instructions:     AVS and GO    Meds ordered this encounter  Medications  . cyclobenzaprine (FLEXERIL) 10 MG tablet    Sig: Take 1 tablet (10 mg total) by mouth at bedtime.    Dispense:  30 tablet    Refill:  3  . potassium chloride SA (K-DUR,KLOR-CON) 20 MEQ tablet    Sig: Take 1 tablet (20 mEq total) by mouth daily.    Dispense:  90 tablet    Refill:  1  . losartan (COZAAR) 100 MG tablet    Sig: Take 1 tablet (100 mg total) by mouth daily.    Dispense:  90 tablet    Refill:  3  . furosemide (LASIX) 40 MG tablet    Sig: Take 1 tablet (40 mg total) by mouth daily. Needs K checked before additional refills    Dispense:  90 tablet    Refill:  1  . omeprazole (PRILOSEC) 40 MG capsule    Sig: Take 1 capsule (40 mg total) by mouth daily.    Dispense:  90 capsule    Refill:  3  . diltiazem (CARTIA XT) 240 MG 24 hr  capsule    Sig: Take 1 capsule (240 mg total) by mouth daily.    Dispense:  90 capsule    Refill:  3   Over 40 min spent in face-to-face evaluation of and consultation with patient and coordination of care.  Over 50% of this time was spent counseling this patient.   I personally performed the services described in this documentation, which was scribed in my presence. The recorded information has been reviewed and considered, and addended by me as needed.   [name], M.D.  [facility] [address] [place], [place] [address] [phone] phone [phone] fax  04/29/2016 7:06 AM

## 2016-04-23 NOTE — Patient Instructions (Signed)
     IF you received an x-ray today, you will receive an invoice from [facility]. Please contact  Radiology at [phone] with questions or concerns regarding your invoice.   IF you received labwork today, you will receive an invoice from [facility]. Please contact [facility] at [phone] with questions or concerns regarding your invoice.   Our billing staff will not be able to assist you with questions regarding bills from these companies.  You will be contacted with the lab results as soon as they are available. The fastest way to get your results is to activate your My Chart account. Instructions are located on the last page of this paperwork. If you have not heard from us regarding the results in 2 weeks, please contact this office.      

## 2016-04-24 LAB — VITAMIN D 25 HYDROXY (VIT D DEFICIENCY, FRACTURES): VIT D 25 HYDROXY: 36 ng/mL (ref 30–100)

## 2016-05-23 DIAGNOSIS — Z029 Encounter for administrative examinations, unspecified: Secondary | ICD-10-CM

## 2016-06-13 NOTE — Progress Notes (Signed)
Jacqueline Atkinson was seen today in the movement disorders clinic for neurologic consultation at the request of [name].   The patient presents today to the movement disorder clinic at [facility] for consultation regarding possible Parkinson's disease.  I had the opportunity to review records from [name], her prior neurologist.  It appears that the patient has been seeing [name] since 12/29/2013.  Patient reports that she just has not been the same ever since she had a fall in 2011.  Her R foot got caught in a stool and she fell.  She was subsequently placed under the care of orthopedic surgeons and intermittently would go to have fluid drained from her knee.   She developed a R leg drag.  She, therefore, initially attributed difficulty in walking to this injury.  She states that later on, she developed balance issues (cannot say how long that took to develop).  Over the course of time, however, she noticed decreased ability to use the right hand in order to write and began to use the left hand for more tasks.  She stated that she would have to reach across her body to use the mouse (noted that as of Christmas, 2014).  In Feb, she was at work and had a bad HA and then had a CT and subsequent MRI of the brain as they saw a meningioma and so she was referred to [name].  She saw [name] on 12/29/2013.  About a week later on 01/06/2014 she followed up with [name] who had recommended an EMG and this was done and was normal.  On 01/17/2014 [name] started her on levodopa and referred her for another opinion to [facility].  She has not yet had that appointment, but it is scheduled for 06/15/2014.  Pt states that when she first got to the pharmacy, there was a RX there for both and IR and CR tablet (she now thinks that it was accident)  She only picked up the CR and took it bid and stated that it helped her feel "looser" and more steady.  When she f/u with [name], she realized that she was supposed to be on  the IR but when she tried that, she had a lot of nausea and even emesis (tried it with carbs even but initially started with 2 po tid; then went to 1 po 6 times per day and didn't have as much emesis but still had to have the carbs with it and she didn't like all the carb intake).  Therefore, she went back to the CR version on her own and she is only on one tablet at 8am/8pm. She presents today for another opinion.  06/24/14 update:  Pt presents for f/u.  DaT scan was done yesterday.  There was significant reduction in uptake of the radiotracer in the bilateral putamen with activity confined to the caudate.  Pt remains on carbidopa/levodopa 25/100 CR at 8am/8pm.  She has been having more knee pain and went to [facility].  She was told that she needs an MRI but she decided that she didn't want to do an MRI if she wasn't going to have surgery and she didn't want to consider that.  She was given klonopin for insomnia since last visit.  She rarely takes it and said that she only took it a few times.  One of the reasons for insomnia is stiffness associated with the back pain.  Klonopin does help her sleep  when she takes it.  She had one fall since last visit; she was going up the steps and her knee buckled and fell.  She is not sure if her knee caused her to fall.  She is exercising; she is doing yoga twice per week.  She is now able to get off of the floor which she didn't used to be able to do.  She is getting ready to start the [facility] exercise class.   09/26/14 update:  Pt returns today for f/u.  I was trying to transition her slowly to the IR formulation of levodopa, which she has had trouble tolerating in the past.  Last visit, I asked her to try to take carbidopa/levodopa 25/100 CR at 8am and 8pm and carbidopa/levodopa 25/100 IR at noon.  She actually moved up the dosage on her own and she is taking carbidopa/levodopa 25/100, 2 at 8am, 2 at noon, 2 at 4pm, and 2 at 8pm.  She may miss one of those dosages and may  only get in 6 pills per day.  She gets really nauseated after 10 minutes but then it gets better if she can get throught that 10 minutes.   MBE was performed on 07/01/14 and it was normal and a regular, thin liquid diet was recommended.  She did got to UC after a fall on knee on 09/13/14. She needs a knee surgery but had to wait until insurance changes.   She is exercising; she is doing yoga twice per week.  C/o a pain in the mid thoracic region that wakes her up in the middle of the night.  Lidoderm has helped but it was very expensive.  Reviewed MRI report from [facility] in 12/2013 and it was normal.  Having more urinary incontinence.  Mood is really good; feels like she is at best place that she has been in virtually all her life.  02/06/15 update:  Pt is accompanied by her sister who supplements the history.  The patient has a history of multiple system atrophy, cerebellar type.  She is on carbidopa/levodopa 25/100.  She is supposed to be on 2 tablets by mouth 4 times per day that she seems to have nausea and emesis when she takes it that way, so she is only taking 1 tablet by mouth 4 times per day.  She is also on Indocin twice a day and is hoping to get off of that after her knee surgery.  She is having knee surgery on Wednesday, which she thinks is the primary etiology for some of her gait issues.  She has been awaiting this knee surgery for a long time.  Her orthopedic surgeon did tell her that he was not sure that it was going to be able to help all of her knee issues, including her gait, however.  No falls.  She went to an ataxia conference in [place]. She asks multiple questions re: genetics.  She is planning on applying to enroll in a MSA-C MRI study involving a 7 Tesla magnet.  She will need to travel to [place] for this.  She has had some urinary incontinence, occasional.  No diplopia.  Walks with cane most of the time.  She also complains that food is getting "stuck" in the midepigastric region when  she is hungry or when she is tired.  This can consist of posterior, meat, or a salad.  She will have to walk around and stretch and sometimes will cough the entire bolus up whole.  She had a modified barium swallow in September of last year that was unremarkable.  06/13/15 update:  The patient is seen back today in follow-up in regard to her multiple system atrophy.  She is on carbidopa/levodopa 25/100, 1 tablet 4 times per day (8am/12pm/4pm/8pm).  She has had trouble tolerating increased dosages because of nausea, but we were previously unsure whether the nausea was from the levodopa or if it was from the Indocin.  Last visit, she stated that she was hopeful to discontinue the Indocin once she had her knee surgery.  She did have this on April 13 under a local block.  I reviewed those records.  She also saw GI since last visit.  I got a note from her gastroenterologist.  She had EGD which revealed evidence of gastric esophageal reflux disease, esophageal stenosis and a medium sized hiatal hernia.  She was started on Nexium, 40 mg twice a day. She states that she has another EGD on sept 10 to hopefully stretch the esophagus. .  She states that they couldn't stretch the esophagus because it was so ulcerated so placed on nexium first.  She is still having trouble with swallowing but thinks that is the esophagus issues.  She is on a soft diet.  Today, the patient states that she is having more cramping at night and more troubles with dexterity in her fingers.  Still having nausea despite off indocin; thinks from levodopa.  10/13/15 update:  The patient is following up today.  She is accompanied by her sister who supplements the history.  I have reviewed records since our last visit.  She has a history of multiple system atrophy.  She increased her carbidopa/levodopa 25/100, 1 tablet 6 times per day and she takes a CR 25/100 at night.  She has noted some rigidity on the right and some tremor on the right but only with  stress or when she is cold.  She thinks that the increase has helped.  She had an EGD in September and I got notes from that.  There were no lesions in the esophagus.  They did a dilation and she continued to have a medium sized hiatal hernia.  She had a left TKA done on 08/23/2015 and subsequently went to a subacute nursing facility for rehabilitation.  She has recovered nicely from that.  She denies any falls since our last visit.  No hallucinations.  No lightheadedness or near syncope.  She has noted a facial twitch on the right, intermittently.  She is having some dry skin.  02/13/16 update:  The patient is following up today.  She is on carbidopa/levodopa 25/100, one tablet 6 times per day (8am/10/2/4/6/8).  She takes carbidopa/levodopa 50/200 CR at bedtime (9-10 pm).  Overall, she feels that she has been stable and doing fairly well.  She denies falls.  She has not had any lightheadedness or near syncope.  No hallucinations.  Little more twitching of th right face.  Last MRI was 10/2013 and that was without gad and meningioma in the L middle cranial fossa.  Noticed that carpets that are "busy" she will lose depth perception.  Having urinary frequency.  Is on diuretic but takes it in the AM.  Asks me to put in DNR orders. States that she has discussed with her daughter and both agree.  Daughter is Marine scientist.  Is working on Systems analyst.  06/14/16 update:  The patient is following up today.  I have reviewed prior records made available  to me.  She did follow up with her primary care physician.  She remains on carbidopa/levodopa 25/100, one tablet 6 times per day in addition to carbidopa/levodopa 50/200 at bedtime.  When she is tired, her R foot will drag.  She has not had any falls since last visit but she has had some near falls when attempting to go backwards.  She knows also that she cannot navigate in the dark.  She walks better with ski poles than with the walker but she has low endurance.  She  denies any hallucinations.  She does have occasional visual distortions.  No lightheadedness or near syncope.  She still have some twitching of the right side of her face.  Mood has been good.   Urinary incontinence has gotten bad enough that she is wearing a pad.  Face has not been twitching as much  Neuroimaging has previously been performed.  It is available for my review today.  PREVIOUS MEDICATIONS: Sinemet and Sinemet CR  ALLERGIES:   Allergies  Allergen Reactions  . Sulfa Antibiotics     Long time ago and does not remember reaction    CURRENT MEDICATIONS:  Current Outpatient Prescriptions on File Prior to Visit  Medication Sig Dispense Refill  . acetaminophen (TYLENOL) 500 MG tablet Take 1,000 mg by mouth every 4 (four) hours as needed for moderate pain.     Marland Kitchen b complex vitamins tablet Take 1 tablet by mouth daily.    . carbidopa-levodopa (SINEMET CR) 50-200 MG tablet Take 1 tablet by mouth at bedtime.    . carbidopa-levodopa (SINEMET IR) 25-100 MG tablet Take 1 tablet 6 times daily 540 tablet 1  . Cholecalciferol (VITAMIN D-3 PO) Take 4,000 Units by mouth daily.    . cyclobenzaprine (FLEXERIL) 10 MG tablet Take 1 tablet (10 mg total) by mouth at bedtime. 30 tablet 3  . diltiazem (CARTIA XT) 240 MG 24 hr capsule Take 1 capsule (240 mg total) by mouth daily. 90 capsule 3  . ferrous sulfate 325 (65 FE) MG tablet Take 325 mg by mouth daily with breakfast.    . furosemide (LASIX) 40 MG tablet Take 1 tablet (40 mg total) by mouth daily. Needs K checked before additional refills (Patient taking differently: Take 40 mg by mouth as needed. Needs K checked before additional refills) 90 tablet 1  . losartan (COZAAR) 100 MG tablet Take 1 tablet (100 mg total) by mouth daily. 90 tablet 3  . Omega-3 Fatty Acids (FISH OIL) 1200 MG CAPS Take by mouth daily.    Marland Kitchen omeprazole (PRILOSEC) 40 MG capsule Take 1 capsule (40 mg total) by mouth daily. 90 capsule 3  . potassium chloride SA (K-DUR,KLOR-CON)  20 MEQ tablet Take 1 tablet (20 mEq total) by mouth daily. 90 tablet 1  . Red Yeast Rice Extract (RED YEAST RICE PO) Take 1,200 mg by mouth daily.    . vitamin B-12 (CYANOCOBALAMIN) 1000 MCG tablet Take 1,000 mcg by mouth daily.    . vitamin C (ASCORBIC ACID) 500 MG tablet Take 500 mg by mouth daily.     No current facility-administered medications on file prior to visit.     PAST MEDICAL HISTORY:   Past Medical History:  Diagnosis Date  . Allergy   . Anemia   . Anxiety   . Arthritis    Left knee  . Edema extremities    lower extremity edema left greater than right.  Marland Kitchen GERD (gastroesophageal reflux disease)   . Heart murmur   .  History of hiatal hernia    small  . Hx of seasonal allergies    occ. use OTC Mucinex or Antihistamine.  . Hyperlipidemia   . Hypertension   . Mobility impaired    ambulates with walker, tendency to lose balance.  . 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 Neuromuscular disorder ([place])    "multi system atrophy disease"- "pain, bilateral foot numbness, left knee pain"- Dr. Carles Collet is following.  . Shoulder disorder    right shoulder "rigidity due to Multi system atrophy"- ROM not limited"just tires me"    PAST SURGICAL HISTORY:   Past Surgical History:  Procedure Laterality Date  . ESOPHAGOGASTRODUODENOSCOPY ENDOSCOPY     with esophageal dilation  . FOOT SURGERY  2011   3rd metatarsal LT foot  . KNEE ARTHROSCOPY Left 02/08/2015   Procedure: LEFT KNEE ARTHROSCOPY ;  Surgeon: Latanya Maudlin, MD;  Location: [facility];  Service: Orthopedics;  Laterality: Left;  . TONSILLECTOMY AND ADENOIDECTOMY    . TOTAL KNEE ARTHROPLASTY Left 08/22/2015   Procedure: LEFT TOTAL  KNEE  ARTHROPLASTY;  Surgeon: Latanya Maudlin, MD;  Location: [facility];  Service: Orthopedics;  Laterality: Left;    SOCIAL HISTORY:   Social History   Social History  . Marital status: Divorced    Spouse name: N/A  . Number of  children: 7  . Years of education: 30   Occupational History  . ACTIVITY COORDINATOR [employer]   Social History Main Topics  . Smoking status: Former Smoker    Packs/day: 1.00    Years: 12.00    Quit date: 03/14/1988  . Smokeless tobacco: Never Used  . Alcohol use No  . Drug use: No  . Sexual activity: Not on file   Other Topics Concern  . Not on file   Social History Narrative  . No narrative on file    FAMILY HISTORY:   Family Status  Relation Status  . Mother Deceased at age 8   diabetes, kidney failure, melanoma  . Father Deceased at age 44   COPD  . Brother Deceased   brain cancer  . Sister Alive   healthy  . Daughter Alive   healthy  . Daughter Alive   healthy  . Daughter Alive   healthy  . Daughter Alive   healthy  . Daughter Alive   healthy  . Son Alive   healthy  . Son Alive   healthy    ROS:  A complete 10 system review of systems was obtained and was unremarkable apart from what is mentioned above.  PHYSICAL EXAMINATION:    VITALS:   Vitals:   06/14/16 1332  BP: 140/90  BP Location: Right Arm  Patient Position: Sitting  Cuff Size: Normal  Pulse: 84  Weight: 239 lb (108.4 kg)  Height: 5\' 8"  (1.727 m)   Wt Readings from Last 3 Encounters:  06/14/16 239 lb (108.4 kg)  04/23/16 231 lb 6.4 oz (105 kg)  02/13/16 224 lb (101.6 kg)     GEN:  The patient appears stated age and is in NAD. HEENT:  Normocephalic, atraumatic.  The mucous membranes are moist. The superficial temporal arteries are without ropiness or tenderness. CV:  RRR w 3/6 sem Lungs:  CTAB Neck/HEME:  There are no carotid bruits bilaterally.  Neurological examination:  Orientation: The patient is alert and oriented x3.  Cranial nerves: There is good facial symmetry.  The visual fields are full to confrontational testing. The speech is fluent and clear. Soft palate rises symmetrically and there is no tongue deviation. Hearing is intact to  conversational tone. Sensation: Sensation is intact to light touch throughout. Motor: Strength is 5/5 in the bilateral upper and lower extremities.   Shoulder shrug is equal and symmetric.  There is no pronator drift.   Movement examination: Tone: There is normal tone in the RUE (greatly improved).  Tone in the RLE is normal.  The tone in the LUE/LLE is normal Abnormal movements: No tremor noted today and no dyskinesia today.  There is occasional spasm around the right eye. Coordination:  She had good RAMs today, with any form of RAMS, including alternating supination and pronation of the forearm, hand opening and closing, finger taps, heel taps and toe taps. Gait and Station: The patient gets out of the chair by pushing off the chair.  She walks with 2 canes.  She flexes at the hip.    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      -She is now on carbidopa/levodopa 25/100,  one tablet 6 times per day in addition to carbidopa/levodopa 50/200 CR at bedtime overall.  She is having trouble turning over in bed at night.  We talked about potentially switching to Rytary, but ultimately decided that added little benefit.  -We talked about ways that she could get in safe, cardiovascular exercise without worsening her knee.  -talked to her about [employer].    -will have social worker call her  2.  Hemifacial spasm, right  -MRI done within without gadolinium on 02/28/2016 demonstrated a known left sphenoid wing meningioma that was stable.  It was otherwise unremarkable.  Reports that this has been better as of late 3.  Dysphagia.  -MBE was performed on 07/01/14 and it was normal and a regular, thin liquid diet was recommended.  She feels that this has not deteriorated. 3.  Knee pain.    -She had left TKA on 08/23/15 and she is doing well. 4.  Urinary incontinence  -talked about referral to urogynecology.  She wants to wait on this for now.  She will let me know if she  changes her mind.   4.   followup in the next few months, sooner should new neurologic issues arise.  Greater than 50% of the 35 minute visit spent in counseling.

## 2016-06-14 ENCOUNTER — Ambulatory Visit (INDEPENDENT_AMBULATORY_CARE_PROVIDER_SITE_OTHER): Payer: BLUE CROSS/BLUE SHIELD | Admitting: Neurology

## 2016-06-14 ENCOUNTER — Encounter: Payer: Self-pay | Admitting: Neurology

## 2016-06-14 VITALS — BP 140/90 | HR 84 | Ht 68.0 in | Wt 239.0 lb

## 2016-06-14 DIAGNOSIS — G239 Degenerative disease of basal ganglia, unspecified: Secondary | ICD-10-CM

## 2016-06-14 DIAGNOSIS — G903 Multi-system degeneration of the autonomic nervous system: Secondary | ICD-10-CM

## 2016-06-14 MED ORDER — CARBIDOPA-LEVODOPA 25-100 MG PO TABS: ORAL_TABLET | ORAL | 2 refills | Status: DC

## 2016-06-14 MED ORDER — CARBIDOPA-LEVODOPA ER 50-200 MG PO TBCR: 1.0000 | EXTENDED_RELEASE_TABLET | Freq: Every day | ORAL | 2 refills | Status: DC

## 2016-06-17 ENCOUNTER — Telehealth: Payer: Self-pay | Admitting: Clinical

## 2016-06-17 NOTE — Telephone Encounter (Signed)
Clinical Social Work Telephone Note  CSW received request from Dr. Carles Collet to call pt regarding Advanced Directives. CSW contacted pt via telephone and no answer at this time. CSW LMOM. Will await call back. CSW to continue to follow.  Alison Murray, MSW, LCSW Clinical Social Worker [facility] [phone]

## 2016-07-22 DIAGNOSIS — Z471 Aftercare following joint replacement surgery: Secondary | ICD-10-CM | POA: Diagnosis not present

## 2016-07-22 DIAGNOSIS — Z96652 Presence of left artificial knee joint: Secondary | ICD-10-CM | POA: Diagnosis not present

## 2016-08-09 ENCOUNTER — Ambulatory Visit: Payer: 59 | Admitting: Family Medicine

## 2016-08-20 ENCOUNTER — Telehealth: Payer: Self-pay

## 2016-08-20 ENCOUNTER — Telehealth: Payer: Self-pay | Admitting: Family Medicine

## 2016-08-20 NOTE — Telephone Encounter (Signed)
Jacqueline Atkinson from [facility] states that the pt cyclobenzaprine has been approved

## 2016-08-20 NOTE — Telephone Encounter (Signed)
PA completed on covermymeds for cyclobenzaprine (high risk for the elderly). Pt is only 32, not over 65. She does have muscle spasms d/t other Dxs of MO0001, G90.3 and G70.9. Pending.

## 2016-08-21 NOTE — Telephone Encounter (Signed)
PA approved through 08/20/17. Notified pharm.

## 2016-08-21 NOTE — Telephone Encounter (Signed)
See notes under other 10/24 message.

## 2016-09-22 NOTE — Progress Notes (Deleted)
Jacqueline Atkinson was seen today in the movement disorders clinic for neurologic consultation at the request of [name].   The patient presents today to the movement disorder clinic at [facility] for consultation regarding possible Parkinson's disease.  I had the opportunity to review records from [name], her prior neurologist.  It appears that the patient has been seeing [name] since 12/29/2013.  Patient reports that she just has not been the same ever since she had a fall in 2011.  Her R foot got caught in a stool and she fell.  She was subsequently placed under the care of orthopedic surgeons and intermittently would go to have fluid drained from her knee.   She developed a R leg drag.  She, therefore, initially attributed difficulty in walking to this injury.  She states that later on, she developed balance issues (cannot say how long that took to develop).  Over the course of time, however, she noticed decreased ability to use the right hand in order to write and began to use the left hand for more tasks.  She stated that she would have to reach across her body to use the mouse (noted that as of Christmas, 2014).  In Feb, she was at work and had a bad HA and then had a CT and subsequent MRI of the brain as they saw a meningioma and so she was referred to [name].  She saw [name] on 12/29/2013.  About a week later on 01/06/2014 she followed up with [name] who had recommended an EMG and this was done and was normal.  On 01/17/2014 [name] started her on levodopa and referred her for another opinion to [facility].  She has not yet had that appointment, but it is scheduled for 06/15/2014.  Pt states that when she first got to the pharmacy, there was a RX there for both and IR and CR tablet (she now thinks that it was accident)  She only picked up the CR and took it bid and stated that it helped her feel "looser" and more steady.  When she f/u with [name], she realized that she was supposed to be on  the IR but when she tried that, she had a lot of nausea and even emesis (tried it with carbs even but initially started with 2 po tid; then went to 1 po 6 times per day and didn't have as much emesis but still had to have the carbs with it and she didn't like all the carb intake).  Therefore, she went back to the CR version on her own and she is only on one tablet at 8am/8pm. She presents today for another opinion.  06/24/14 update:  Pt presents for f/u.  DaT scan was done yesterday.  There was significant reduction in uptake of the radiotracer in the bilateral putamen with activity confined to the caudate.  Pt remains on carbidopa/levodopa 25/100 CR at 8am/8pm.  She has been having more knee pain and went to [place].  She was told that she needs an MRI but she decided that she didn't want to do an MRI if she wasn't going to have surgery and she didn't want to consider that.  She was given klonopin for insomnia since last visit.  She rarely takes it and said that she only took it a few times.  One of the reasons for insomnia is stiffness associated with the back pain.  Klonopin does help her sleep  when she takes it.  She had one fall since last visit; she was going up the steps and her knee buckled and fell.  She is not sure if her knee caused her to fall.  She is exercising; she is doing yoga twice per week.  She is now able to get off of the floor which she didn't used to be able to do.  She is getting ready to start the [facility] exercise class.   09/26/14 update:  Pt returns today for f/u.  I was trying to transition her slowly to the IR formulation of levodopa, which she has had trouble tolerating in the past.  Last visit, I asked her to try to take carbidopa/levodopa 25/100 CR at 8am and 8pm and carbidopa/levodopa 25/100 IR at noon.  She actually moved up the dosage on her own and she is taking carbidopa/levodopa 25/100, 2 at 8am, 2 at noon, 2 at 4pm, and 2 at 8pm.  She may miss one of those dosages and may  only get in 6 pills per day.  She gets really nauseated after 10 minutes but then it gets better if she can get throught that 10 minutes.   MBE was performed on 07/01/14 and it was normal and a regular, thin liquid diet was recommended.  She did got to UC after a fall on knee on 09/13/14. She needs a knee surgery but had to wait until insurance changes.   She is exercising; she is doing yoga twice per week.  C/o a pain in the mid thoracic region that wakes her up in the middle of the night.  Lidoderm has helped but it was very expensive.  Reviewed MRI report from [facility] in 12/2013 and it was normal.  Having more urinary incontinence.  Mood is really good; feels like she is at best place that she has been in virtually all her life.  02/06/15 update:  Pt is accompanied by her sister who supplements the history.  The patient has a history of multiple system atrophy, cerebellar type.  She is on carbidopa/levodopa 25/100.  She is supposed to be on 2 tablets by mouth 4 times per day that she seems to have nausea and emesis when she takes it that way, so she is only taking 1 tablet by mouth 4 times per day.  She is also on Indocin twice a day and is hoping to get off of that after her knee surgery.  She is having knee surgery on Wednesday, which she thinks is the primary etiology for some of her gait issues.  She has been awaiting this knee surgery for a long time.  Her orthopedic surgeon did tell her that he was not sure that it was going to be able to help all of her knee issues, including her gait, however.  No falls.  She went to an ataxia conference in [place]. She asks multiple questions re: genetics.  She is planning on applying to enroll in a MSA-C MRI study involving a 7 Tesla magnet.  She will need to travel to [place] for this.  She has had some urinary incontinence, occasional.  No diplopia.  Walks with cane most of the time.  She also complains that food is getting "stuck" in the midepigastric region when  she is hungry or when she is tired.  This can consist of posterior, meat, or a salad.  She will have to walk around and stretch and sometimes will cough the entire bolus up whole.  She had a modified barium swallow in September of last year that was unremarkable.  06/13/15 update:  The patient is seen back today in follow-up in regard to her multiple system atrophy.  She is on carbidopa/levodopa 25/100, 1 tablet 4 times per day (8am/12pm/4pm/8pm).  She has had trouble tolerating increased dosages because of nausea, but we were previously unsure whether the nausea was from the levodopa or if it was from the Indocin.  Last visit, she stated that she was hopeful to discontinue the Indocin once she had her knee surgery.  She did have this on April 13 under a local block.  I reviewed those records.  She also saw GI since last visit.  I got a note from her gastroenterologist.  She had EGD which revealed evidence of gastric esophageal reflux disease, esophageal stenosis and a medium sized hiatal hernia.  She was started on Nexium, 40 mg twice a day. She states that she has another EGD on sept 10 to hopefully stretch the esophagus. .  She states that they couldn't stretch the esophagus because it was so ulcerated so placed on nexium first.  She is still having trouble with swallowing but thinks that is the esophagus issues.  She is on a soft diet.  Today, the patient states that she is having more cramping at night and more troubles with dexterity in her fingers.  Still having nausea despite off indocin; thinks from levodopa.  10/13/15 update:  The patient is following up today.  She is accompanied by her sister who supplements the history.  I have reviewed records since our last visit.  She has a history of multiple system atrophy.  She increased her carbidopa/levodopa 25/100, 1 tablet 6 times per day and she takes a CR 25/100 at night.  She has noted some rigidity on the right and some tremor on the right but only with  stress or when she is cold.  She thinks that the increase has helped.  She had an EGD in September and I got notes from that.  There were no lesions in the esophagus.  They did a dilation and she continued to have a medium sized hiatal hernia.  She had a left TKA done on 08/23/2015 and subsequently went to a subacute nursing facility for rehabilitation.  She has recovered nicely from that.  She denies any falls since our last visit.  No hallucinations.  No lightheadedness or near syncope.  She has noted a facial twitch on the right, intermittently.  She is having some dry skin.  02/13/16 update:  The patient is following up today.  She is on carbidopa/levodopa 25/100, one tablet 6 times per day (8am/10/2/4/6/8).  She takes carbidopa/levodopa 50/200 CR at bedtime (9-10 pm).  Overall, she feels that she has been stable and doing fairly well.  She denies falls.  She has not had any lightheadedness or near syncope.  No hallucinations.  Little more twitching of th right face.  Last MRI was 10/2013 and that was without gad and meningioma in the L middle cranial fossa.  Noticed that carpets that are "busy" she will lose depth perception.  Having urinary frequency.  Is on diuretic but takes it in the AM.  Asks me to put in DNR orders. States that she has discussed with her daughter and both agree.  Daughter is Marine scientist.  Is working on Systems analyst.  06/14/16 update:  The patient is following up today.  I have reviewed prior records made available  to me.  She did follow up with her primary care physician.  She remains on carbidopa/levodopa 25/100, one tablet 6 times per day in addition to carbidopa/levodopa 50/200 at bedtime.  When she is tired, her R foot will drag.  She has not had any falls since last visit but she has had some near falls when attempting to go backwards.  She knows also that she cannot navigate in the dark.  She walks better with ski poles than with the walker but she has low endurance.  She  denies any hallucinations.  She does have occasional visual distortions.  No lightheadedness or near syncope.  She still have some twitching of the right side of her face.  Mood has been good.   Urinary incontinence has gotten bad enough that she is wearing a pad.  Face has not been twitching as much  09/24/16 update:  Pt f/u today.  She remains on carbidopa/levodopa 25/100, one tablet 6 times per day in addition to carbidopa/levodopa 50/200 at bedtime.  Pt denies falls but has had near falls.  Pt denies lightheadedness, near syncope.  No hallucinations.  Mood has been good.  In regards to bladder control she states that ***.    PREVIOUS MEDICATIONS: Sinemet and Sinemet CR  ALLERGIES:   Allergies  Allergen Reactions  . Sulfa Antibiotics     Long time ago and does not remember reaction    CURRENT MEDICATIONS:  Current Outpatient Prescriptions on File Prior to Visit  Medication Sig Dispense Refill  . acetaminophen (TYLENOL) 500 MG tablet Take 1,000 mg by mouth every 4 (four) hours as needed for moderate pain.     Marland Kitchen b complex vitamins tablet Take 1 tablet by mouth daily.    . carbidopa-levodopa (SINEMET CR) 50-200 MG tablet Take 1 tablet by mouth at bedtime. 90 tablet 2  . carbidopa-levodopa (SINEMET IR) 25-100 MG tablet Take 1 tablet 6 times daily 540 tablet 2  . Cholecalciferol (VITAMIN D-3 PO) Take 4,000 Units by mouth daily.    . cyclobenzaprine (FLEXERIL) 10 MG tablet Take 1 tablet (10 mg total) by mouth at bedtime. 30 tablet 3  . diltiazem (CARTIA XT) 240 MG 24 hr capsule Take 1 capsule (240 mg total) by mouth daily. 90 capsule 3  . ferrous sulfate 325 (65 FE) MG tablet Take 325 mg by mouth daily with breakfast.    . furosemide (LASIX) 40 MG tablet Take 1 tablet (40 mg total) by mouth daily. Needs K checked before additional refills (Patient taking differently: Take 40 mg by mouth as needed. Needs K checked before additional refills) 90 tablet 1  . losartan (COZAAR) 100 MG tablet Take 1  tablet (100 mg total) by mouth daily. 90 tablet 3  . Omega-3 Fatty Acids (FISH OIL) 1200 MG CAPS Take by mouth daily.    Marland Kitchen omeprazole (PRILOSEC) 40 MG capsule Take 1 capsule (40 mg total) by mouth daily. 90 capsule 3  . potassium chloride SA (K-DUR,KLOR-CON) 20 MEQ tablet Take 1 tablet (20 mEq total) by mouth daily. 90 tablet 1  . Red Yeast Rice Extract (RED YEAST RICE PO) Take 1,200 mg by mouth daily.    . vitamin B-12 (CYANOCOBALAMIN) 1000 MCG tablet Take 1,000 mcg by mouth daily.    . vitamin C (ASCORBIC ACID) 500 MG tablet Take 500 mg by mouth daily.     No current facility-administered medications on file prior to visit.     PAST MEDICAL HISTORY:   Past Medical History:  Diagnosis Date  . Allergy   . Anemia   . Anxiety   . Arthritis    Left knee  . Edema extremities    lower extremity edema left greater than right.  Marland Kitchen GERD (gastroesophageal reflux disease)   . Heart murmur   . History of hiatal hernia    small  . Hx of seasonal allergies    occ. use OTC Mucinex or Antihistamine.  . Hyperlipidemia   . Hypertension   . Mobility impaired    ambulates with walker, tendency to lose balance.  . 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 Neuromuscular disorder ([place])    "multi system atrophy disease"- "pain, bilateral foot numbness, left knee pain"- Dr. Carles Collet is following.  . Shoulder disorder    right shoulder "rigidity due to Multi system atrophy"- ROM not limited"just tires me"    PAST SURGICAL HISTORY:   Past Surgical History:  Procedure Laterality Date  . ESOPHAGOGASTRODUODENOSCOPY ENDOSCOPY     with esophageal dilation  . FOOT SURGERY  2011   3rd metatarsal LT foot  . KNEE ARTHROSCOPY Left 02/08/2015   Procedure: LEFT KNEE ARTHROSCOPY ;  Surgeon: Latanya Maudlin, MD;  Location: [facility];  Service: Orthopedics;  Laterality: Left;  . TONSILLECTOMY AND ADENOIDECTOMY    . TOTAL KNEE ARTHROPLASTY Left  08/22/2015   Procedure: LEFT TOTAL  KNEE  ARTHROPLASTY;  Surgeon: Latanya Maudlin, MD;  Location: [facility];  Service: Orthopedics;  Laterality: Left;    SOCIAL HISTORY:   Social History   Social History  . Marital status: Divorced    Spouse name: N/A  . Number of children: 7  . Years of education: 67   Occupational History  . ACTIVITY COORDINATOR [employer]   Social History Main Topics  . Smoking status: Former Smoker    Packs/day: 1.00    Years: 12.00    Quit date: 03/14/1988  . Smokeless tobacco: Never Used  . Alcohol use No  . Drug use: No  . Sexual activity: Not on file   Other Topics Concern  . Not on file   Social History Narrative  . No narrative on file    FAMILY HISTORY:   Family Status  Relation Status  . Mother Deceased at age 72   diabetes, kidney failure, melanoma  . Father Deceased at age 43   COPD  . Brother Deceased   brain cancer  . Sister Alive   healthy  . Daughter Alive   healthy  . Daughter Alive   healthy  . Daughter Alive   healthy  . Daughter Alive   healthy  . Daughter Alive   healthy  . Son Alive   healthy  . Son Alive   healthy    ROS:  A complete 10 system review of systems was obtained and was unremarkable apart from what is mentioned above.  PHYSICAL EXAMINATION:    VITALS:   There were no vitals filed for this visit. Wt Readings from Last 3 Encounters:  06/14/16 239 lb (108.4 kg)  04/23/16 231 lb 6.4 oz (105 kg)  02/13/16 224 lb (101.6 kg)     GEN:  The patient appears stated age and is in NAD. HEENT:  Normocephalic, atraumatic.  The mucous membranes are moist. The superficial temporal arteries are without ropiness or tenderness. CV:  RRR w 3/6 sem Lungs:  CTAB Neck/HEME:  There are no carotid bruits bilaterally.  Neurological examination:  Orientation: The patient is alert and oriented x3.  Cranial nerves: There is good facial symmetry.   The visual fields are full to confrontational  testing. The speech is fluent and clear. Soft palate rises symmetrically and there is no tongue deviation. Hearing is intact to conversational tone. Sensation: Sensation is intact to light touch throughout. Motor: Strength is 5/5 in the bilateral upper and lower extremities.   Shoulder shrug is equal and symmetric.  There is no pronator drift.   Movement examination: Tone: There is normal tone in the RUE (greatly improved).  Tone in the RLE is normal.  The tone in the LUE/LLE is normal Abnormal movements: No tremor noted today and no dyskinesia today.  There is occasional spasm around the right eye. Coordination:  She had good RAMs today, with any form of RAMS, including alternating supination and pronation of the forearm, hand opening and closing, finger taps, heel taps and toe taps. Gait and Station: The patient gets out of the chair by pushing off the chair.  She walks with 2 canes.  She flexes at the hip.    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      -She is now on carbidopa/levodopa 25/100,  one tablet 6 times per day in addition to carbidopa/levodopa 50/200 CR at bedtime overall.  She is having trouble turning over in bed at night.  We talked about potentially switching to Rytary, but ultimately decided that added little benefit. 2.  Hemifacial spasm, right  -MRI done within without gadolinium on 02/28/2016 demonstrated a known left sphenoid wing meningioma that was stable.  It was otherwise unremarkable.  Reports that this has been better as of late 3.  Dysphagia.  -MBE was performed on 07/01/14 and it was normal and a regular, thin liquid diet was recommended.  She feels that this has not deteriorated. 4.  Knee pain.    -She had left TKA on 08/23/15 and she is doing well. 5.  Urinary incontinence  -talked about referral to urogynecology.  She wants to wait on this for now.  She will let me know if she changes her mind.   4.   followup in the next few months,  sooner should new neurologic issues arise.  Greater than 50% of the 35 minute visit spent in counseling.

## 2016-09-24 ENCOUNTER — Ambulatory Visit: Payer: BLUE CROSS/BLUE SHIELD | Admitting: Neurology

## 2016-11-07 NOTE — Progress Notes (Signed)
Jacqueline Atkinson was seen today in the movement disorders clinic for neurologic consultation at the request of [name].   The patient presents today to the movement disorder clinic at [facility] for consultation regarding possible Parkinson's disease.  I had the opportunity to review records from [name], her prior neurologist.  It appears that the patient has been seeing [name] since 12/29/2013.  Patient reports that she just has not been the same ever since she had a fall in 2011.  Her R foot got caught in a stool and she fell.  She was subsequently placed under the care of orthopedic surgeons and intermittently would go to have fluid drained from her knee.   She developed a R leg drag.  She, therefore, initially attributed difficulty in walking to this injury.  She states that later on, she developed balance issues (cannot say how long that took to develop).  Over the course of time, however, she noticed decreased ability to use the right hand in order to write and began to use the left hand for more tasks.  She stated that she would have to reach across her body to use the mouse (noted that as of Christmas, 2014).  In Feb, she was at work and had a bad HA and then had a CT and subsequent MRI of the brain as they saw a meningioma and so she was referred to [name].  She saw [name] on 12/29/2013.  About a week later on 01/06/2014 she followed up with [name] who had recommended an EMG and this was done and was normal.  On 01/17/2014 [name] started her on levodopa and referred her for another opinion to [facility].  She has not yet had that appointment, but it is scheduled for 06/15/2014.  Pt states that when she first got to the pharmacy, there was a RX there for both and IR and CR tablet (she now thinks that it was accident)  She only picked up the CR and took it bid and stated that it helped her feel "looser" and more steady.  When she f/u with [name], she realized that she was supposed to be on  the IR but when she tried that, she had a lot of nausea and even emesis (tried it with carbs even but initially started with 2 po tid; then went to 1 po 6 times per day and didn't have as much emesis but still had to have the carbs with it and she didn't like all the carb intake).  Therefore, she went back to the CR version on her own and she is only on one tablet at 8am/8pm. She presents today for another opinion.  06/24/14 update:  Pt presents for f/u.  DaT scan was done yesterday.  There was significant reduction in uptake of the radiotracer in the bilateral putamen with activity confined to the caudate.  Pt remains on carbidopa/levodopa 25/100 CR at 8am/8pm.  She has been having more knee pain and went to [facility].  She was told that she needs an MRI but she decided that she didn't want to do an MRI if she wasn't going to have surgery and she didn't want to consider that.  She was given klonopin for insomnia since last visit.  She rarely takes it and said that she only took it a few times.  One of the reasons for insomnia is stiffness associated with the back pain.  Klonopin does help her sleep  when she takes it.  She had one fall since last visit; she was going up the steps and her knee buckled and fell.  She is not sure if her knee caused her to fall.  She is exercising; she is doing yoga twice per week.  She is now able to get off of the floor which she didn't used to be able to do.  She is getting ready to start the [employer] exercise class.   09/26/14 update:  Pt returns today for f/u.  I was trying to transition her slowly to the IR formulation of levodopa, which she has had trouble tolerating in the past.  Last visit, I asked her to try to take carbidopa/levodopa 25/100 CR at 8am and 8pm and carbidopa/levodopa 25/100 IR at noon.  She actually moved up the dosage on her own and she is taking carbidopa/levodopa 25/100, 2 at 8am, 2 at noon, 2 at 4pm, and 2 at 8pm.  She may miss one of those dosages and may  only get in 6 pills per day.  She gets really nauseated after 10 minutes but then it gets better if she can get throught that 10 minutes.   MBE was performed on 07/01/14 and it was normal and a regular, thin liquid diet was recommended.  She did got to UC after a fall on knee on 09/13/14. She needs a knee surgery but had to wait until insurance changes.   She is exercising; she is doing yoga twice per week.  C/o a pain in the mid thoracic region that wakes her up in the middle of the night.  Lidoderm has helped but it was very expensive.  Reviewed MRI report from [facility] in 12/2013 and it was normal.  Having more urinary incontinence.  Mood is really good; feels like she is at best place that she has been in virtually all her life.  02/06/15 update:  Pt is accompanied by her sister who supplements the history.  The patient has a history of multiple system atrophy, cerebellar type.  She is on carbidopa/levodopa 25/100.  She is supposed to be on 2 tablets by mouth 4 times per day that she seems to have nausea and emesis when she takes it that way, so she is only taking 1 tablet by mouth 4 times per day.  She is also on Indocin twice a day and is hoping to get off of that after her knee surgery.  She is having knee surgery on Wednesday, which she thinks is the primary etiology for some of her gait issues.  She has been awaiting this knee surgery for a long time.  Her orthopedic surgeon did tell her that he was not sure that it was going to be able to help all of her knee issues, including her gait, however.  No falls.  She went to an ataxia conference in [place]. She asks multiple questions re: genetics.  She is planning on applying to enroll in a MSA-C MRI study involving a 7 Tesla magnet.  She will need to travel to [place] for this.  She has had some urinary incontinence, occasional.  No diplopia.  Walks with cane most of the time.  She also complains that food is getting "stuck" in the midepigastric region when  she is hungry or when she is tired.  This can consist of posterior, meat, or a salad.  She will have to walk around and stretch and sometimes will cough the entire bolus up whole.  She had a modified barium swallow in September of last year that was unremarkable.  06/13/15 update:  The patient is seen back today in follow-up in regard to her multiple system atrophy.  She is on carbidopa/levodopa 25/100, 1 tablet 4 times per day (8am/12pm/4pm/8pm).  She has had trouble tolerating increased dosages because of nausea, but we were previously unsure whether the nausea was from the levodopa or if it was from the Indocin.  Last visit, she stated that she was hopeful to discontinue the Indocin once she had her knee surgery.  She did have this on April 13 under a local block.  I reviewed those records.  She also saw GI since last visit.  I got a note from her gastroenterologist.  She had EGD which revealed evidence of gastric esophageal reflux disease, esophageal stenosis and a medium sized hiatal hernia.  She was started on Nexium, 40 mg twice a day. She states that she has another EGD on sept 10 to hopefully stretch the esophagus. .  She states that they couldn't stretch the esophagus because it was so ulcerated so placed on nexium first.  She is still having trouble with swallowing but thinks that is the esophagus issues.  She is on a soft diet.  Today, the patient states that she is having more cramping at night and more troubles with dexterity in her fingers.  Still having nausea despite off indocin; thinks from levodopa.  10/13/15 update:  The patient is following up today.  She is accompanied by her sister who supplements the history.  I have reviewed records since our last visit.  She has a history of multiple system atrophy.  She increased her carbidopa/levodopa 25/100, 1 tablet 6 times per day and she takes a CR 25/100 at night.  She has noted some rigidity on the right and some tremor on the right but only with  stress or when she is cold.  She thinks that the increase has helped.  She had an EGD in September and I got notes from that.  There were no lesions in the esophagus.  They did a dilation and she continued to have a medium sized hiatal hernia.  She had a left TKA done on 08/23/2015 and subsequently went to a subacute nursing facility for rehabilitation.  She has recovered nicely from that.  She denies any falls since our last visit.  No hallucinations.  No lightheadedness or near syncope.  She has noted a facial twitch on the right, intermittently.  She is having some dry skin.  02/13/16 update:  The patient is following up today.  She is on carbidopa/levodopa 25/100, one tablet 6 times per day (8am/10/2/4/6/8).  She takes carbidopa/levodopa 50/200 CR at bedtime (9-10 pm).  Overall, she feels that she has been stable and doing fairly well.  She denies falls.  She has not had any lightheadedness or near syncope.  No hallucinations.  Little more twitching of th right face.  Last MRI was 10/2013 and that was without gad and meningioma in the L middle cranial fossa.  Noticed that carpets that are "busy" she will lose depth perception.  Having urinary frequency.  Is on diuretic but takes it in the AM.  Asks me to put in DNR orders. States that she has discussed with her daughter and both agree.  Daughter is Marine scientist.  Is working on Systems analyst.  06/14/16 update:  The patient is following up today.  I have reviewed prior records made available  to me.  She did follow up with her primary care physician.  She remains on carbidopa/levodopa 25/100, one tablet 6 times per day in addition to carbidopa/levodopa 50/200 at bedtime.  When she is tired, her R foot will drag.  She has not had any falls since last visit but she has had some near falls when attempting to go backwards.  She knows also that she cannot navigate in the dark.  She walks better with ski poles than with the walker but she has low endurance.  She  denies any hallucinations.  She does have occasional visual distortions.  No lightheadedness or near syncope.  She still have some twitching of the right side of her face.  Mood has been good.   Urinary incontinence has gotten bad enough that she is wearing a pad.  Face has not been twitching as much  11/12/16 update:  Pt f/u today.  This patient is accompanied in the office by her daughter who supplements the history.  She was on carbidopa/levodopa 25/100, one tablet 6 times per day in addition to carbidopa/levodopa 50/200 at bedtime but she states that she got tired of taking pills so she is taking carbidopa/levodopa 50/200 in the AM and at night and carbidopa/levodopa 25/100 tid.  States that she is having "issues" but had these way before meds were changed.   Pt denies falls but near falls especially when leaning over to pick up something.  R foot wants to "stick" to the floor, independent of timing of meds and even if walker is present.   Trouble getting up from chair and bought a lift chair.  Pt denies lightheadedness, near syncope.  No hallucinations.  Mood has been good.  Having some trouble preparing meals - it is too much effort in preparing the meal.  Same is true with housekeeping.  Noting more clumsy and dropping objects but hands not numb.  Going to [employer] boxing and using gait belt there.  Wearing pad all of the time due to incontinence.    Trouble getting in and out of bed.  Trouble turning over in bed - "Im like a beached whale."    Neuroimaging has previously been performed.  It is available for my review today.  PREVIOUS MEDICATIONS: Sinemet and Sinemet CR  ALLERGIES:   Allergies  Allergen Reactions  . Sulfa Antibiotics     Long time ago and does not remember reaction    CURRENT MEDICATIONS:  Current Outpatient Prescriptions on File Prior to Visit  Medication Sig Dispense Refill  . acetaminophen (TYLENOL) 500 MG tablet Take 1,000 mg by mouth every 4 (four) hours as needed for  moderate pain.     . carbidopa-levodopa (SINEMET CR) 50-200 MG tablet Take 1 tablet by mouth at bedtime. (Patient taking differently: Take 1 tablet by mouth 2 (two) times daily. ) 90 tablet 2  . carbidopa-levodopa (SINEMET IR) 25-100 MG tablet Take 1 tablet 6 times daily (Patient taking differently: Take 1 tablet by mouth 3 (three) times daily. ) 540 tablet 2  . diltiazem (CARTIA XT) 240 MG 24 hr capsule Take 1 capsule (240 mg total) by mouth daily. 90 capsule 3  . losartan (COZAAR) 100 MG tablet Take 1 tablet (100 mg total) by mouth daily. 90 tablet 3  . omeprazole (PRILOSEC) 40 MG capsule Take 1 capsule (40 mg total) by mouth daily. (Patient taking differently: Take 80 mg by mouth daily. ) 90 capsule 3   No current facility-administered medications on file prior  to visit.     PAST MEDICAL HISTORY:   Past Medical History:  Diagnosis Date  . Allergy   . Anemia   . Anxiety   . Arthritis    Left knee  . Edema extremities    lower extremity edema left greater than right.  Marland Kitchen GERD (gastroesophageal reflux disease)   . Heart murmur   . History of hiatal hernia    small  . Hx of seasonal allergies    occ. use OTC Mucinex or Antihistamine.  . Hyperlipidemia   . Hypertension   . Mobility impaired    ambulates with walker, tendency to lose balance.  . 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 Neuromuscular disorder ([place])    "multi system atrophy disease"- "pain, bilateral foot numbness, left knee pain"- Dr. Carles Collet is following.  . Shoulder disorder    right shoulder "rigidity due to Multi system atrophy"- ROM not limited"just tires me"    PAST SURGICAL HISTORY:   Past Surgical History:  Procedure Laterality Date  . ESOPHAGOGASTRODUODENOSCOPY ENDOSCOPY     with esophageal dilation  . FOOT SURGERY  2011   3rd metatarsal LT foot  . KNEE ARTHROSCOPY Left 02/08/2015   Procedure: LEFT KNEE ARTHROSCOPY ;  Surgeon: Latanya Maudlin, MD;  Location: [facility];  Service: Orthopedics;  Laterality: Left;  . TONSILLECTOMY AND ADENOIDECTOMY    . TOTAL KNEE ARTHROPLASTY Left 08/22/2015   Procedure: LEFT TOTAL  KNEE  ARTHROPLASTY;  Surgeon: Latanya Maudlin, MD;  Location: [facility];  Service: Orthopedics;  Laterality: Left;    SOCIAL HISTORY:   Social History   Social History  . Marital status: Divorced    Spouse name: N/A  . Number of children: 7  . Years of education: 57   Occupational History  . ACTIVITY COORDINATOR [employer]   Social History Main Topics  . Smoking status: Former Smoker    Packs/day: 1.00    Years: 12.00    Quit date: 03/14/1988  . Smokeless tobacco: Never Used  . Alcohol use No  . Drug use: No  . Sexual activity: Not on file   Other Topics Concern  . Not on file   Social History Narrative  . No narrative on file    FAMILY HISTORY:   Family Status  Relation Status  . Mother Deceased at age 34   diabetes, kidney failure, melanoma  . Father Deceased at age 21   COPD  . Brother Deceased   brain cancer  . Sister Alive   healthy  . Daughter Alive   healthy  . Daughter Alive   healthy  . Daughter Alive   healthy  . Daughter Alive   healthy  . Daughter Alive   healthy  . Son Alive   healthy  . Son Alive   healthy    ROS:  A complete 10 system review of systems was obtained and was unremarkable apart from what is mentioned above.  PHYSICAL EXAMINATION:    VITALS:   Vitals:   11/12/16 0818  BP: 120/74  Pulse: 68  SpO2: 97%  Weight: 232 lb (105.2 kg)  Height: 5\' 8"  (1.727 m)   Wt Readings from Last 3 Encounters:  11/12/16 232 lb (105.2 kg)  06/14/16 239 lb (108.4 kg)  04/23/16 231 lb 6.4 oz (105 kg)     GEN:  The patient appears stated age and is in NAD. HEENT:  Normocephalic, atraumatic.  The mucous membranes are moist. The superficial temporal arteries are without ropiness or tenderness.  There is mild R hemifacial spasm CV:  RRR w 3/6  sem Lungs:  CTAB Neck/HEME:  There are no carotid bruits bilaterally.  Neurological examination:  Orientation: The patient is alert and oriented x3.  Cranial nerves: There is good facial symmetry.   The visual fields are full to confrontational testing. The speech is fluent and clear. Soft palate rises symmetrically and there is no tongue deviation. Hearing is intact to conversational tone. Sensation: Sensation is intact to light touch throughout. Motor: Strength is 5/5 in the bilateral upper and lower extremities.   Shoulder shrug is equal and symmetric.  There is no pronator drift.   Movement examination: Tone: There is normal tone in the RUE (greatly improved).  Tone in the RLE is normal.  The tone in the LUE/LLE is normal Abnormal movements: No tremor noted today.  There is mild dyskinesia of the L leg Coordination:  She had slowing of RAMs with finger taps and hand opening and closing on the right.  They were normal on the L Gait and Station: The patient gets out of the chair by pushing off the chair.  She walks with her walker  She flexes at the hip.    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      -She is now on carbidopa/levodopa 25/100 tid and carbidopa/levodopa 50/200 bid.  Will refill today.  -We talked about ways that she could get in safe, cardiovascular exercise without worsening her knee.  -enrolled in [employer].   -talked about housekeeping services/meal preparation services.  She does have LTC insurance but states that she isn't ready for that.  Encouraged her to just look at assisted living.  -filled out handicap placard today   -will have social worker find out if they can find a volunteer to help her at [employer]  -PT/home safety eval.  Need home therapy as having trouble getting out with cold weather  -hospital bed with trapeze RX 2.  Hemifacial spasm, right  -MRI done within without gadolinium on 02/28/2016 demonstrated a known  left sphenoid wing meningioma that was stable.  It was otherwise unremarkable.  Reports that this has been better as of late 3.  Dysphagia.  -MBE was performed on 07/01/14 and it was normal and a regular, thin liquid diet was recommended.  She feels that this has not deteriorated. 3.  Knee pain.    -She had left TKA on 08/23/15 and she is doing well. 4.  Urinary incontinence  -talked about referral to urogynecology.  She wants referral for that now and will refer.   5.   followup in the next 3 months, sooner should new neurologic issues arise.  Greater than 50% of the 35 minute visit spent in counseling.

## 2016-11-12 ENCOUNTER — Telehealth: Payer: Self-pay | Admitting: Clinical

## 2016-11-12 ENCOUNTER — Ambulatory Visit (INDEPENDENT_AMBULATORY_CARE_PROVIDER_SITE_OTHER): Payer: Medicare Other | Admitting: Neurology

## 2016-11-12 ENCOUNTER — Encounter: Payer: Self-pay | Admitting: Neurology

## 2016-11-12 VITALS — BP 120/74 | HR 68 | Ht 68.0 in | Wt 232.0 lb

## 2016-11-12 DIAGNOSIS — G239 Degenerative disease of basal ganglia, unspecified: Secondary | ICD-10-CM

## 2016-11-12 DIAGNOSIS — G513 Clonic hemifacial spasm: Secondary | ICD-10-CM

## 2016-11-12 DIAGNOSIS — G5139 Clonic hemifacial spasm, unspecified: Secondary | ICD-10-CM

## 2016-11-12 DIAGNOSIS — R32 Unspecified urinary incontinence: Secondary | ICD-10-CM | POA: Diagnosis not present

## 2016-11-12 DIAGNOSIS — G903 Multi-system degeneration of the autonomic nervous system: Secondary | ICD-10-CM

## 2016-11-12 MED ORDER — CARBIDOPA-LEVODOPA ER 50-200 MG PO TBCR: 1.0000 | EXTENDED_RELEASE_TABLET | Freq: Two times a day (BID) | ORAL | 1 refills | Status: DC

## 2016-11-12 NOTE — Patient Instructions (Signed)
1. Referral sent to Dr. Quincy Simmonds at [facility]. She is a uro-gynecologist. Their office will call you directly to schedule an appt. They can be reached at [phone].   2. We will refer you to [facility] for physical and occupational therapy for a home safety evaluation. They will contact you directly to schedule and contact you about the hospital bed.

## 2016-11-12 NOTE — Telephone Encounter (Signed)
Melville trainer, Sales executive per patient request and notified of pt concern about having "corner man" for assistance during [employer] class as individuals that was coming to assist pt is no longer available to assist. Arbie Cookey states that she will look into options to assist pt during class and contact pt directly to notify. CSW confirmed Arbie Cookey had correct contact phone number.  CSW contacted pt via telephone to update and notify that Arbie Cookey from [employer] will be in touch with her directly to further discuss.   Alison Murray, MSW, LCSW Clinical Social Worker [facility] [phone]

## 2016-11-16 DIAGNOSIS — I10 Essential (primary) hypertension: Secondary | ICD-10-CM | POA: Diagnosis not present

## 2016-11-16 DIAGNOSIS — G513 Clonic hemifacial spasm: Secondary | ICD-10-CM | POA: Diagnosis not present

## 2016-11-16 DIAGNOSIS — G527 Disorders of multiple cranial nerves: Secondary | ICD-10-CM | POA: Diagnosis not present

## 2016-11-16 DIAGNOSIS — D649 Anemia, unspecified: Secondary | ICD-10-CM | POA: Diagnosis not present

## 2016-11-16 DIAGNOSIS — Z96652 Presence of left artificial knee joint: Secondary | ICD-10-CM | POA: Diagnosis not present

## 2016-11-16 DIAGNOSIS — Z87891 Personal history of nicotine dependence: Secondary | ICD-10-CM | POA: Diagnosis not present

## 2016-11-16 DIAGNOSIS — F419 Anxiety disorder, unspecified: Secondary | ICD-10-CM | POA: Diagnosis not present

## 2016-11-16 DIAGNOSIS — Z9181 History of falling: Secondary | ICD-10-CM | POA: Diagnosis not present

## 2016-11-16 DIAGNOSIS — K219 Gastro-esophageal reflux disease without esophagitis: Secondary | ICD-10-CM | POA: Diagnosis not present

## 2016-11-16 DIAGNOSIS — M1991 Primary osteoarthritis, unspecified site: Secondary | ICD-10-CM | POA: Diagnosis not present

## 2016-11-18 ENCOUNTER — Telehealth: Payer: Self-pay | Admitting: Neurology

## 2016-11-18 NOTE — Telephone Encounter (Signed)
Spoke with Genelle Gather, PT, and gave verbal order for them to see patient 1xwk for 1 wk, then 2x wk for 5 weeks.

## 2016-11-18 NOTE — Telephone Encounter (Signed)
Ms Ronnald Ramp with [facility] left a message regarding PT and physical therapy and would like a call back/Dawm CB# [phone]

## 2016-11-19 ENCOUNTER — Telehealth: Payer: Self-pay | Admitting: Neurology

## 2016-11-19 DIAGNOSIS — R32 Unspecified urinary incontinence: Secondary | ICD-10-CM

## 2016-11-19 NOTE — Telephone Encounter (Addendum)
Received note from Dr. Quincy Simmonds that patient would be better fit for a referral to [facility]. Ref[facility] to [facility] at [phone] with confirmation received. They will contact the patient to schedule.

## 2016-12-02 ENCOUNTER — Telehealth: Payer: Self-pay | Admitting: Neurology

## 2016-12-02 NOTE — Telephone Encounter (Signed)
Patient has appt with [facility] - Dr. Wendy Poet 01/03/17 at 9 am.

## 2016-12-06 ENCOUNTER — Telehealth: Payer: Self-pay | Admitting: Neurology

## 2016-12-06 NOTE — Telephone Encounter (Signed)
Left message on machine for patient to call back.  Orders were sent to [facility] 11/12/16. Need to discuss with patient. Awaiting call back.

## 2016-12-06 NOTE — Telephone Encounter (Signed)
Jacqueline Atkinson 09/21/1956. She would like you to please call her at [phone]. Thank you

## 2016-12-06 NOTE — Telephone Encounter (Signed)
Spoke with patient and she states [facility] does not have order. Sent a message to Corene Cornea to inquire about hospital bed.

## 2016-12-06 NOTE — Telephone Encounter (Signed)
Patient made aware that this was sent to [facility]. She did not hear from them. She was given their number per her request to follow up and will let me know if I need to send these orders to a different company.

## 2016-12-06 NOTE — Telephone Encounter (Signed)
Jacqueline Atkinson 02/08/1956. Her # [id number] She was calling regarding needing to get an order from Dr. Carles Collet for  [facility] bed and an order for OT. She needs the order to be faxed to Melene Muller (office manager) at [facility] Fax # is [phone]. Thank you

## 2016-12-17 ENCOUNTER — Telehealth: Payer: Self-pay | Admitting: Neurology

## 2016-12-17 NOTE — Telephone Encounter (Signed)
Referral for hospital bed faxed to [facility] at [phone] with confirmation received. Patient had not heard from [facility] yet re: this. Message had been sent through [facility]. Called and made patient aware I did fax this.

## 2016-12-20 ENCOUNTER — Telehealth: Payer: Self-pay | Admitting: Neurology

## 2016-12-20 NOTE — Telephone Encounter (Signed)
Jacqueline Atkinson 05/22/1956. Her # [phone] [id number]. She would like you to call her regarding hospital equipment (DME). Thank you

## 2016-12-20 NOTE — Telephone Encounter (Signed)
Spoke with patient and made her aware we did receive form to sign from [facility] and will send on Monday.

## 2016-12-20 NOTE — Telephone Encounter (Signed)
Left message on machine for patient to call back.

## 2016-12-20 NOTE — Telephone Encounter (Signed)
Jacqueline Atkinson 11/09/1955. She was returning your call. Thank you

## 2016-12-30 DIAGNOSIS — R269 Unspecified abnormalities of gait and mobility: Secondary | ICD-10-CM | POA: Diagnosis not present

## 2016-12-30 DIAGNOSIS — R32 Unspecified urinary incontinence: Secondary | ICD-10-CM | POA: Diagnosis not present

## 2016-12-30 DIAGNOSIS — G239 Degenerative disease of basal ganglia, unspecified: Secondary | ICD-10-CM | POA: Diagnosis not present

## 2016-12-30 DIAGNOSIS — G513 Clonic hemifacial spasm: Secondary | ICD-10-CM | POA: Diagnosis not present

## 2017-01-03 DIAGNOSIS — N390 Urinary tract infection, site not specified: Secondary | ICD-10-CM | POA: Diagnosis not present

## 2017-01-03 DIAGNOSIS — N3946 Mixed incontinence: Secondary | ICD-10-CM | POA: Diagnosis not present

## 2017-01-03 DIAGNOSIS — R351 Nocturia: Secondary | ICD-10-CM | POA: Diagnosis not present

## 2017-01-03 DIAGNOSIS — R350 Frequency of micturition: Secondary | ICD-10-CM | POA: Diagnosis not present

## 2017-01-09 ENCOUNTER — Telehealth: Payer: Self-pay | Admitting: Neurology

## 2017-01-09 NOTE — Telephone Encounter (Signed)
Note from urology dated 01/03/17 that she was seen by Dr. Matilde Sprang and he started her on vesicare

## 2017-01-30 DIAGNOSIS — R269 Unspecified abnormalities of gait and mobility: Secondary | ICD-10-CM | POA: Diagnosis not present

## 2017-01-30 DIAGNOSIS — G513 Clonic hemifacial spasm: Secondary | ICD-10-CM | POA: Diagnosis not present

## 2017-01-30 DIAGNOSIS — R32 Unspecified urinary incontinence: Secondary | ICD-10-CM | POA: Diagnosis not present

## 2017-01-30 DIAGNOSIS — G239 Degenerative disease of basal ganglia, unspecified: Secondary | ICD-10-CM | POA: Diagnosis not present

## 2017-02-10 ENCOUNTER — Ambulatory Visit: Payer: BLUE CROSS/BLUE SHIELD | Admitting: Neurology

## 2017-02-12 DIAGNOSIS — N3946 Mixed incontinence: Secondary | ICD-10-CM | POA: Diagnosis not present

## 2017-02-12 DIAGNOSIS — R350 Frequency of micturition: Secondary | ICD-10-CM | POA: Diagnosis not present

## 2017-03-01 DIAGNOSIS — R32 Unspecified urinary incontinence: Secondary | ICD-10-CM | POA: Diagnosis not present

## 2017-03-01 DIAGNOSIS — G513 Clonic hemifacial spasm: Secondary | ICD-10-CM | POA: Diagnosis not present

## 2017-03-01 DIAGNOSIS — G239 Degenerative disease of basal ganglia, unspecified: Secondary | ICD-10-CM | POA: Diagnosis not present

## 2017-03-01 DIAGNOSIS — R269 Unspecified abnormalities of gait and mobility: Secondary | ICD-10-CM | POA: Diagnosis not present

## 2017-03-06 NOTE — Progress Notes (Signed)
Jacqueline Atkinson was seen today in the movement disorders clinic for neurologic consultation at the request of [name].   The patient presents today to the movement disorder clinic at [facility] for consultation regarding possible Parkinson's disease.  I had the opportunity to review records from [name], her prior neurologist.  It appears that the patient has been seeing [name] since 12/29/2013.  Patient reports that she just has not been the same ever since she had a fall in 2011.  Her R foot got caught in a stool and she fell.  She was subsequently placed under the care of orthopedic surgeons and intermittently would go to have fluid drained from her knee.   She developed a R leg drag.  She, therefore, initially attributed difficulty in walking to this injury.  She states that later on, she developed balance issues (cannot say how long that took to develop).  Over the course of time, however, she noticed decreased ability to use the right hand in order to write and began to use the left hand for more tasks.  She stated that she would have to reach across her body to use the mouse (noted that as of Christmas, 2014).  In Feb, she was at work and had a bad HA and then had a CT and subsequent MRI of the brain as they saw a meningioma and so she was referred to [name].  She saw [name] on 12/29/2013.  About a week later on 01/06/2014 she followed up with [name] who had recommended an EMG and this was done and was normal.  On 01/17/2014 [name] started her on levodopa and referred her for another opinion to [facility].  She has not yet had that appointment, but it is scheduled for 06/15/2014.  Pt states that when she first got to the pharmacy, there was a RX there for both and IR and CR tablet (she now thinks that it was accident)  She only picked up the CR and took it bid and stated that it helped her feel "looser" and more steady.  When she f/u with [name], she realized that she was supposed to be on  the IR but when she tried that, she had a lot of nausea and even emesis (tried it with carbs even but initially started with 2 po tid; then went to 1 po 6 times per day and didn't have as much emesis but still had to have the carbs with it and she didn't like all the carb intake).  Therefore, she went back to the CR version on her own and she is only on one tablet at 8am/8pm. She presents today for another opinion.  06/24/14 update:  Pt presents for f/u.  DaT scan was done yesterday.  There was significant reduction in uptake of the radiotracer in the bilateral putamen with activity confined to the caudate.  Pt remains on carbidopa/levodopa 25/100 CR at 8am/8pm.  She has been having more knee pain and went to [facility].  She was told that she needs an MRI but she decided that she didn't want to do an MRI if she wasn't going to have surgery and she didn't want to consider that.  She was given klonopin for insomnia since last visit.  She rarely takes it and said that she only took it a few times.  One of the reasons for insomnia is stiffness associated with the back pain.  Klonopin does help her sleep  when she takes it.  She had one fall since last visit; she was going up the steps and her knee buckled and fell.  She is not sure if her knee caused her to fall.  She is exercising; she is doing yoga twice per week.  She is now able to get off of the floor which she didn't used to be able to do.  She is getting ready to start the [facility] exercise class.   09/26/14 update:  Pt returns today for f/u.  I was trying to transition her slowly to the IR formulation of levodopa, which she has had trouble tolerating in the past.  Last visit, I asked her to try to take carbidopa/levodopa 25/100 CR at 8am and 8pm and carbidopa/levodopa 25/100 IR at noon.  She actually moved up the dosage on her own and she is taking carbidopa/levodopa 25/100, 2 at 8am, 2 at noon, 2 at 4pm, and 2 at 8pm.  She may miss one of those dosages and may  only get in 6 pills per day.  She gets really nauseated after 10 minutes but then it gets better if she can get throught that 10 minutes.   MBE was performed on 07/01/14 and it was normal and a regular, thin liquid diet was recommended.  She did got to UC after a fall on knee on 09/13/14. She needs a knee surgery but had to wait until insurance changes.   She is exercising; she is doing yoga twice per week.  C/o a pain in the mid thoracic region that wakes her up in the middle of the night.  Lidoderm has helped but it was very expensive.  Reviewed MRI report from [facility] in 12/2013 and it was normal.  Having more urinary incontinence.  Mood is really good; feels like she is at best place that she has been in virtually all her life.  02/06/15 update:  Pt is accompanied by her sister who supplements the history.  The patient has a history of multiple system atrophy, cerebellar type.  She is on carbidopa/levodopa 25/100.  She is supposed to be on 2 tablets by mouth 4 times per day that she seems to have nausea and emesis when she takes it that way, so she is only taking 1 tablet by mouth 4 times per day.  She is also on Indocin twice a day and is hoping to get off of that after her knee surgery.  She is having knee surgery on Wednesday, which she thinks is the primary etiology for some of her gait issues.  She has been awaiting this knee surgery for a long time.  Her orthopedic surgeon did tell her that he was not sure that it was going to be able to help all of her knee issues, including her gait, however.  No falls.  She went to an ataxia conference in [place]. She asks multiple questions re: genetics.  She is planning on applying to enroll in a MSA-C MRI study involving a 7 Tesla magnet.  She will need to travel to [place] for this.  She has had some urinary incontinence, occasional.  No diplopia.  Walks with cane most of the time.  She also complains that food is getting "stuck" in the midepigastric region when  she is hungry or when she is tired.  This can consist of posterior, meat, or a salad.  She will have to walk around and stretch and sometimes will cough the entire bolus up whole.  She had a modified barium swallow in September of last year that was unremarkable.  06/13/15 update:  The patient is seen back today in follow-up in regard to her multiple system atrophy.  She is on carbidopa/levodopa 25/100, 1 tablet 4 times per day (8am/12pm/4pm/8pm).  She has had trouble tolerating increased dosages because of nausea, but we were previously unsure whether the nausea was from the levodopa or if it was from the Indocin.  Last visit, she stated that she was hopeful to discontinue the Indocin once she had her knee surgery.  She did have this on April 13 under a local block.  I reviewed those records.  She also saw GI since last visit.  I got a note from her gastroenterologist.  She had EGD which revealed evidence of gastric esophageal reflux disease, esophageal stenosis and a medium sized hiatal hernia.  She was started on Nexium, 40 mg twice a day. She states that she has another EGD on sept 10 to hopefully stretch the esophagus. .  She states that they couldn't stretch the esophagus because it was so ulcerated so placed on nexium first.  She is still having trouble with swallowing but thinks that is the esophagus issues.  She is on a soft diet.  Today, the patient states that she is having more cramping at night and more troubles with dexterity in her fingers.  Still having nausea despite off indocin; thinks from levodopa.  10/13/15 update:  The patient is following up today.  She is accompanied by her sister who supplements the history.  I have reviewed records since our last visit.  She has a history of multiple system atrophy.  She increased her carbidopa/levodopa 25/100, 1 tablet 6 times per day and she takes a CR 25/100 at night.  She has noted some rigidity on the right and some tremor on the right but only with  stress or when she is cold.  She thinks that the increase has helped.  She had an EGD in September and I got notes from that.  There were no lesions in the esophagus.  They did a dilation and she continued to have a medium sized hiatal hernia.  She had a left TKA done on 08/23/2015 and subsequently went to a subacute nursing facility for rehabilitation.  She has recovered nicely from that.  She denies any falls since our last visit.  No hallucinations.  No lightheadedness or near syncope.  She has noted a facial twitch on the right, intermittently.  She is having some dry skin.  02/13/16 update:  The patient is following up today.  She is on carbidopa/levodopa 25/100, one tablet 6 times per day (8am/10/2/4/6/8).  She takes carbidopa/levodopa 50/200 CR at bedtime (9-10 pm).  Overall, she feels that she has been stable and doing fairly well.  She denies falls.  She has not had any lightheadedness or near syncope.  No hallucinations.  Little more twitching of th right face.  Last MRI was 10/2013 and that was without gad and meningioma in the L middle cranial fossa.  Noticed that carpets that are "busy" she will lose depth perception.  Having urinary frequency.  Is on diuretic but takes it in the AM.  Asks me to put in DNR orders. States that she has discussed with her daughter and both agree.  Daughter is Marine scientist.  Is working on Systems analyst.  06/14/16 update:  The patient is following up today.  I have reviewed prior records made available  to me.  She did follow up with her primary care physician.  She remains on carbidopa/levodopa 25/100, one tablet 6 times per day in addition to carbidopa/levodopa 50/200 at bedtime.  When she is tired, her R foot will drag.  She has not had any falls since last visit but she has had some near falls when attempting to go backwards.  She knows also that she cannot navigate in the dark.  She walks better with ski poles than with the walker but she has low endurance.  She  denies any hallucinations.  She does have occasional visual distortions.  No lightheadedness or near syncope.  She still have some twitching of the right side of her face.  Mood has been good.   Urinary incontinence has gotten bad enough that she is wearing a pad.  Face has not been twitching as much  11/12/16 update:  Pt f/u today.  This patient is accompanied in the office by her daughter who supplements the history.  She was on carbidopa/levodopa 25/100, one tablet 6 times per day in addition to carbidopa/levodopa 50/200 at bedtime but she states that she got tired of taking pills so she is taking carbidopa/levodopa 50/200 in the AM and at night and carbidopa/levodopa 25/100 tid.  States that she is having "issues" but had these way before meds were changed.   Pt denies falls but near falls especially when leaning over to pick up something.  R foot wants to "stick" to the floor, independent of timing of meds and even if walker is present.   Trouble getting up from chair and bought a lift chair.  Pt denies lightheadedness, near syncope.  No hallucinations.  Mood has been good.  Having some trouble preparing meals - it is too much effort in preparing the meal.  Same is true with housekeeping.  Noting more clumsy and dropping objects but hands not numb.  Going to rock steady boxing and using gait belt there.  Wearing pad all of the time due to incontinence.    Trouble getting in and out of bed.  Trouble turning over in bed - "Im like a beached whale."    03/07/17 update:  Pt seen today in f/u.  Pt on carbidopa/levodopa 25/100 tid and also takes carbidopa/levodopa 50/200 bid. She is having lots of cramping even in her arms.   This is not necessarily how I wanted her to take medication but she thinks that it works as well this way as when she was taking more of the IR tablets.  No falls but some near falls getting up from the lift chair.  Pt denies lightheadedness, near syncope.  No hallucinations.  Mood has been  good.  The patient did go to [facility] and I reviewed notes.  She was started on Vesicare.  She states that it was great but insurance wouldn't pay for it so she was started on detrol.  It helps but not as much as vesicare.  Last visit we also ordered a hospital bed and she reports that she loves it and it has helped.  She has gained weight and plans to start new diet.  She stopped rock steady boxing because of cost  Neuroimaging has previously been performed.  It is available for my review today.  PREVIOUS MEDICATIONS: Sinemet and Sinemet CR  ALLERGIES:   Allergies  Allergen Reactions  . Sulfa Antibiotics     Long time ago and does not remember reaction    CURRENT MEDICATIONS:  Current Outpatient Prescriptions on File Prior to Visit  Medication Sig Dispense Refill  . acetaminophen (TYLENOL) 500 MG tablet Take 1,000 mg by mouth every 4 (four) hours as needed for moderate pain.     . carbidopa-levodopa (SINEMET CR) 50-200 MG tablet Take 1 tablet by mouth 2 (two) times daily. 180 tablet 1  . carbidopa-levodopa (SINEMET IR) 25-100 MG tablet Take 1 tablet 6 times daily (Patient taking differently: Take 1 tablet by mouth 3 (three) times daily. ) 540 tablet 2  . diltiazem (CARTIA XT) 240 MG 24 hr capsule Take 1 capsule (240 mg total) by mouth daily. 90 capsule 3  . losartan (COZAAR) 100 MG tablet Take 1 tablet (100 mg total) by mouth daily. 90 tablet 3  . omeprazole (PRILOSEC) 40 MG capsule Take 1 capsule (40 mg total) by mouth daily. (Patient taking differently: Take 80 mg by mouth daily. ) 90 capsule 3   No current facility-administered medications on file prior to visit.     PAST MEDICAL HISTORY:   Past Medical History:  Diagnosis Date  . Allergy   . Anemia   . Anxiety   . Arthritis    Left knee  . Edema extremities    lower extremity edema left greater than right.  Marland Kitchen GERD (gastroesophageal reflux disease)   . Heart murmur   . History of hiatal hernia    small  . Hx of  seasonal allergies    occ. use OTC Mucinex or Antihistamine.  . Hyperlipidemia   . Hypertension   . Mobility impaired    ambulates with walker, tendency to lose balance.  . 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 Neuromuscular disorder ([place])    "multi system atrophy disease"- "pain, bilateral foot numbness, left knee pain"- Dr. Carles Collet is following.  . Shoulder disorder    right shoulder "rigidity due to Multi system atrophy"- ROM not limited"just tires me"    PAST SURGICAL HISTORY:   Past Surgical History:  Procedure Laterality Date  . ESOPHAGOGASTRODUODENOSCOPY ENDOSCOPY     with esophageal dilation  . FOOT SURGERY  2011   3rd metatarsal LT foot  . KNEE ARTHROSCOPY Left 02/08/2015   Procedure: LEFT KNEE ARTHROSCOPY ;  Surgeon: Latanya Maudlin, MD;  Location: [facility];  Service: Orthopedics;  Laterality: Left;  . TONSILLECTOMY AND ADENOIDECTOMY    . TOTAL KNEE ARTHROPLASTY Left 08/22/2015   Procedure: LEFT TOTAL  KNEE  ARTHROPLASTY;  Surgeon: Latanya Maudlin, MD;  Location: [facility];  Service: Orthopedics;  Laterality: Left;    SOCIAL HISTORY:   Social History   Social History  . Marital status: Divorced    Spouse name: N/A  . Number of children: 7  . Years of education: 14   Occupational History  . ACTIVITY COORDINATOR [employer]   Social History Main Topics  . Smoking status: Former Smoker    Packs/day: 1.00    Years: 12.00    Quit date: 03/14/1988  . Smokeless tobacco: Never Used  . Alcohol use No  . Drug use: No  . Sexual activity: Not on file   Other Topics Concern  . Not on file   Social History Narrative  . No narrative on file    FAMILY HISTORY:   Family Status  Relation Status  . Mother Deceased at age 73       diabetes, kidney failure, melanoma  . Father Deceased at age 55  COPD  . Brother Deceased       brain cancer  . Sister Alive       healthy  . Daughter  Alive       healthy  . Daughter Alive       healthy  . Daughter Alive       healthy  . Daughter Alive       healthy  . Daughter Alive       healthy  . Son Alive       healthy  . Son Alive       healthy    ROS:  A complete 10 system review of systems was obtained and was unremarkable apart from what is mentioned above.  PHYSICAL EXAMINATION:    VITALS:   Vitals:   03/07/17 1506  BP: 130/84  Pulse: 96  SpO2: 96%  Weight: 247 lb (112 kg)  Height: 5\' 8"  (1.727 m)   Wt Readings from Last 3 Encounters:  03/07/17 247 lb (112 kg)  11/12/16 232 lb (105.2 kg)  06/14/16 239 lb (108.4 kg)     GEN:  The patient appears stated age and is in NAD. HEENT:  Normocephalic, atraumatic.  The mucous membranes are moist. The superficial temporal arteries are without ropiness or tenderness.  There is mild R hemifacial spasm CV:  RRR w 3/6 sem Lungs:  CTAB Neck/HEME:  There are no carotid bruits bilaterally.  Neurological examination:  Orientation: The patient is alert and oriented x3.  Cranial nerves: There is good facial symmetry.   The visual fields are full to confrontational testing. The speech is fluent and clear. Soft palate rises symmetrically and there is no tongue deviation. Hearing is intact to conversational tone. Sensation: Sensation is intact to light touch throughout. Motor: Strength is 5/5 in the bilateral upper and lower extremities.   Shoulder shrug is equal and symmetric.  There is no pronator drift.   Movement examination: Tone: There is mild-mod increased tone in the RUE.  Tone in the RLE is normal.  The tone in the LUE/LLE is normal Abnormal movements: No tremor noted today.  There is mild dyskinesia of the L leg Coordination:  She had slowing of RAMs with finger taps and hand opening and closing on the right.  They were normal on the L Gait and Station: The patient gets out of the chair by pushing off the chair.  She walks with her walker  She flexes at the hip.      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      -She is now on carbidopa/levodopa 25/100 tid and carbidopa/levodopa 50/200 bid.  Think she is getting cramping because of changing from IR to CR during the day.  Will go back to the IR and either take 2 po tid (prior had nausea when tried that) or 1 po 6 times per day).  Stay on carbidopa/levodopa 50/200 q hs.    -talked about housekeeping services/meal preparation services.  She does have LTC insurance but states that she isn't ready for that.  Encouraged her to just look at assisted living. 2.  Hemifacial spasm, right  -MRI done within without gadolinium on 02/28/2016 demonstrated a known left sphenoid wing meningioma that was stable.  It was otherwise unremarkable.  Reports that this has been better as of late 3.  Dysphagia.  -MBE was performed on 07/01/14 and it was normal and a regular, thin liquid diet was recommended.  She  feels that this has not deteriorated. 3.  Knee pain.    -She had left TKA on 08/23/15 and she is doing well. 4.  Urinary incontinence  -saw urology and started on vesicare and did well but insurance didn't pay.  Now on detrol. 5.   followup in the next 3 months, sooner should new neurologic issues arise.  Greater than 50% of the 35 minute visit spent in counseling.

## 2017-03-07 ENCOUNTER — Ambulatory Visit (INDEPENDENT_AMBULATORY_CARE_PROVIDER_SITE_OTHER): Payer: Medicare Other | Admitting: Neurology

## 2017-03-07 ENCOUNTER — Encounter: Payer: Self-pay | Admitting: Neurology

## 2017-03-07 VITALS — BP 130/84 | HR 96 | Ht 68.0 in | Wt 247.0 lb

## 2017-03-07 DIAGNOSIS — G239 Degenerative disease of basal ganglia, unspecified: Secondary | ICD-10-CM | POA: Diagnosis not present

## 2017-03-07 DIAGNOSIS — G513 Clonic hemifacial spasm: Secondary | ICD-10-CM

## 2017-03-07 DIAGNOSIS — R32 Unspecified urinary incontinence: Secondary | ICD-10-CM

## 2017-03-07 DIAGNOSIS — G903 Multi-system degeneration of the autonomic nervous system: Secondary | ICD-10-CM

## 2017-03-07 DIAGNOSIS — G5139 Clonic hemifacial spasm, unspecified: Secondary | ICD-10-CM

## 2017-04-01 DIAGNOSIS — G513 Clonic hemifacial spasm: Secondary | ICD-10-CM | POA: Diagnosis not present

## 2017-04-01 DIAGNOSIS — R269 Unspecified abnormalities of gait and mobility: Secondary | ICD-10-CM | POA: Diagnosis not present

## 2017-04-01 DIAGNOSIS — R32 Unspecified urinary incontinence: Secondary | ICD-10-CM | POA: Diagnosis not present

## 2017-04-01 DIAGNOSIS — G239 Degenerative disease of basal ganglia, unspecified: Secondary | ICD-10-CM | POA: Diagnosis not present

## 2017-04-13 ENCOUNTER — Other Ambulatory Visit: Payer: Self-pay | Admitting: Family Medicine

## 2017-05-01 DIAGNOSIS — R32 Unspecified urinary incontinence: Secondary | ICD-10-CM | POA: Diagnosis not present

## 2017-05-01 DIAGNOSIS — G513 Clonic hemifacial spasm: Secondary | ICD-10-CM | POA: Diagnosis not present

## 2017-05-01 DIAGNOSIS — G239 Degenerative disease of basal ganglia, unspecified: Secondary | ICD-10-CM | POA: Diagnosis not present

## 2017-05-01 DIAGNOSIS — R269 Unspecified abnormalities of gait and mobility: Secondary | ICD-10-CM | POA: Diagnosis not present

## 2017-06-01 DIAGNOSIS — R32 Unspecified urinary incontinence: Secondary | ICD-10-CM | POA: Diagnosis not present

## 2017-06-01 DIAGNOSIS — G513 Clonic hemifacial spasm: Secondary | ICD-10-CM | POA: Diagnosis not present

## 2017-06-01 DIAGNOSIS — G239 Degenerative disease of basal ganglia, unspecified: Secondary | ICD-10-CM | POA: Diagnosis not present

## 2017-06-01 DIAGNOSIS — R269 Unspecified abnormalities of gait and mobility: Secondary | ICD-10-CM | POA: Diagnosis not present

## 2017-06-26 NOTE — Progress Notes (Signed)
Jacqueline Atkinson was seen today in the movement disorders clinic for neurologic consultation at the request of [name].   The patient presents today to the movement disorder clinic at [facility] for consultation regarding possible Parkinson's disease.  I had the opportunity to review records from [name], her prior neurologist.  It appears that the patient has been seeing [name] since 12/29/2013.  Patient reports that she just has not been the same ever since she had a fall in 2011.  Her R foot got caught in a stool and she fell.  She was subsequently placed under the care of orthopedic surgeons and intermittently would go to have fluid drained from her knee.   She developed a R leg drag.  She, therefore, initially attributed difficulty in walking to this injury.  She states that later on, she developed balance issues (cannot say how long that took to develop).  Over the course of time, however, she noticed decreased ability to use the right hand in order to write and began to use the left hand for more tasks.  She stated that she would have to reach across her body to use the mouse (noted that as of Christmas, 2014).  In Feb, she was at work and had a bad HA and then had a CT and subsequent MRI of the brain as they saw a meningioma and so she was referred to [name].  She saw [name] on 12/29/2013.  About a week later on 01/06/2014 she followed up with [name] who had recommended an EMG and this was done and was normal.  On 01/17/2014 [name] started her on levodopa and referred her for another opinion to [facility].  She has not yet had that appointment, but it is scheduled for 06/15/2014.  Pt states that when she first got to the pharmacy, there was a RX there for both and IR and CR tablet (she now thinks that it was accident)  She only picked up the CR and took it bid and stated that it helped her feel "looser" and more steady.  When she f/u with [name], she realized that she was supposed to be on  the IR but when she tried that, she had a lot of nausea and even emesis (tried it with carbs even but initially started with 2 po tid; then went to 1 po 6 times per day and didn't have as much emesis but still had to have the carbs with it and she didn't like all the carb intake).  Therefore, she went back to the CR version on her own and she is only on one tablet at 8am/8pm. She presents today for another opinion.  06/24/14 update:  Pt presents for f/u.  DaT scan was done yesterday.  There was significant reduction in uptake of the radiotracer in the bilateral putamen with activity confined to the caudate.  Pt remains on carbidopa/levodopa 25/100 CR at 8am/8pm.  She has been having more knee pain and went to [facility].  She was told that she needs an MRI but she decided that she didn't want to do an MRI if she wasn't going to have surgery and she didn't want to consider that.  She was given klonopin for insomnia since last visit.  She rarely takes it and said that she only took it a few times.  One of the reasons for insomnia is stiffness associated with the back pain.  Klonopin does help her sleep  when she takes it.  She had one fall since last visit; she was going up the steps and her knee buckled and fell.  She is not sure if her knee caused her to fall.  She is exercising; she is doing yoga twice per week.  She is now able to get off of the floor which she didn't used to be able to do.  She is getting ready to start the [facility] exercise class.   09/26/14 update:  Pt returns today for f/u.  I was trying to transition her slowly to the IR formulation of levodopa, which she has had trouble tolerating in the past.  Last visit, I asked her to try to take carbidopa/levodopa 25/100 CR at 8am and 8pm and carbidopa/levodopa 25/100 IR at noon.  She actually moved up the dosage on her own and she is taking carbidopa/levodopa 25/100, 2 at 8am, 2 at noon, 2 at 4pm, and 2 at 8pm.  She may miss one of those dosages and may  only get in 6 pills per day.  She gets really nauseated after 10 minutes but then it gets better if she can get throught that 10 minutes.   MBE was performed on 07/01/14 and it was normal and a regular, thin liquid diet was recommended.  She did got to UC after a fall on knee on 09/13/14. She needs a knee surgery but had to wait until insurance changes.   She is exercising; she is doing yoga twice per week.  C/o a pain in the mid thoracic region that wakes her up in the middle of the night.  Lidoderm has helped but it was very expensive.  Reviewed MRI report from [facility] in 12/2013 and it was normal.  Having more urinary incontinence.  Mood is really good; feels like she is at best place that she has been in virtually all her life.  02/06/15 update:  Pt is accompanied by her sister who supplements the history.  The patient has a history of multiple system atrophy, cerebellar type.  She is on carbidopa/levodopa 25/100.  She is supposed to be on 2 tablets by mouth 4 times per day that she seems to have nausea and emesis when she takes it that way, so she is only taking 1 tablet by mouth 4 times per day.  She is also on Indocin twice a day and is hoping to get off of that after her knee surgery.  She is having knee surgery on Wednesday, which she thinks is the primary etiology for some of her gait issues.  She has been awaiting this knee surgery for a long time.  Her orthopedic surgeon did tell her that he was not sure that it was going to be able to help all of her knee issues, including her gait, however.  No falls.  She went to an ataxia conference in [place]. She asks multiple questions re: genetics.  She is planning on applying to enroll in a MSA-C MRI study involving a 7 Tesla magnet.  She will need to travel to [place] for this.  She has had some urinary incontinence, occasional.  No diplopia.  Walks with cane most of the time.  She also complains that food is getting "stuck" in the midepigastric region when  she is hungry or when she is tired.  This can consist of posterior, meat, or a salad.  She will have to walk around and stretch and sometimes will cough the entire bolus up whole.  She had a modified barium swallow in September of last year that was unremarkable.  06/13/15 update:  The patient is seen back today in follow-up in regard to her multiple system atrophy.  She is on carbidopa/levodopa 25/100, 1 tablet 4 times per day (8am/12pm/4pm/8pm).  She has had trouble tolerating increased dosages because of nausea, but we were previously unsure whether the nausea was from the levodopa or if it was from the Indocin.  Last visit, she stated that she was hopeful to discontinue the Indocin once she had her knee surgery.  She did have this on April 13 under a local block.  I reviewed those records.  She also saw GI since last visit.  I got a note from her gastroenterologist.  She had EGD which revealed evidence of gastric esophageal reflux disease, esophageal stenosis and a medium sized hiatal hernia.  She was started on Nexium, 40 mg twice a day. She states that she has another EGD on sept 10 to hopefully stretch the esophagus. .  She states that they couldn't stretch the esophagus because it was so ulcerated so placed on nexium first.  She is still having trouble with swallowing but thinks that is the esophagus issues.  She is on a soft diet.  Today, the patient states that she is having more cramping at night and more troubles with dexterity in her fingers.  Still having nausea despite off indocin; thinks from levodopa.  10/13/15 update:  The patient is following up today.  She is accompanied by her sister who supplements the history.  I have reviewed records since our last visit.  She has a history of multiple system atrophy.  She increased her carbidopa/levodopa 25/100, 1 tablet 6 times per day and she takes a CR 25/100 at night.  She has noted some rigidity on the right and some tremor on the right but only with  stress or when she is cold.  She thinks that the increase has helped.  She had an EGD in September and I got notes from that.  There were no lesions in the esophagus.  They did a dilation and she continued to have a medium sized hiatal hernia.  She had a left TKA done on 08/23/2015 and subsequently went to a subacute nursing facility for rehabilitation.  She has recovered nicely from that.  She denies any falls since our last visit.  No hallucinations.  No lightheadedness or near syncope.  She has noted a facial twitch on the right, intermittently.  She is having some dry skin.  02/13/16 update:  The patient is following up today.  She is on carbidopa/levodopa 25/100, one tablet 6 times per day (8am/10/2/4/6/8).  She takes carbidopa/levodopa 50/200 CR at bedtime (9-10 pm).  Overall, she feels that she has been stable and doing fairly well.  She denies falls.  She has not had any lightheadedness or near syncope.  No hallucinations.  Little more twitching of th right face.  Last MRI was 10/2013 and that was without gad and meningioma in the L middle cranial fossa.  Noticed that carpets that are "busy" she will lose depth perception.  Having urinary frequency.  Is on diuretic but takes it in the AM.  Asks me to put in DNR orders. States that she has discussed with her daughter and both agree.  Daughter is Marine scientist.  Is working on Systems analyst.  06/14/16 update:  The patient is following up today.  I have reviewed prior records made available  to me.  She did follow up with her primary care physician.  She remains on carbidopa/levodopa 25/100, one tablet 6 times per day in addition to carbidopa/levodopa 50/200 at bedtime.  When she is tired, her R foot will drag.  She has not had any falls since last visit but she has had some near falls when attempting to go backwards.  She knows also that she cannot navigate in the dark.  She walks better with ski poles than with the walker but she has low endurance.  She  denies any hallucinations.  She does have occasional visual distortions.  No lightheadedness or near syncope.  She still have some twitching of the right side of her face.  Mood has been good.   Urinary incontinence has gotten bad enough that she is wearing a pad.  Face has not been twitching as much  11/12/16 update:  Pt f/u today.  This patient is accompanied in the office by her daughter who supplements the history.  She was on carbidopa/levodopa 25/100, one tablet 6 times per day in addition to carbidopa/levodopa 50/200 at bedtime but she states that she got tired of taking pills so she is taking carbidopa/levodopa 50/200 in the AM and at night and carbidopa/levodopa 25/100 tid.  States that she is having "issues" but had these way before meds were changed.   Pt denies falls but near falls especially when leaning over to pick up something.  R foot wants to "stick" to the floor, independent of timing of meds and even if walker is present.   Trouble getting up from chair and bought a lift chair.  Pt denies lightheadedness, near syncope.  No hallucinations.  Mood has been good.  Having some trouble preparing meals - it is too much effort in preparing the meal.  Same is true with housekeeping.  Noting more clumsy and dropping objects but hands not numb.  Going to rock steady boxing and using gait belt there.  Wearing pad all of the time due to incontinence.    Trouble getting in and out of bed.  Trouble turning over in bed - "Im like a beached whale."    03/07/17 update:  Pt seen today in f/u.  Pt on carbidopa/levodopa 25/100 tid and also takes carbidopa/levodopa 50/200 bid. She is having lots of cramping even in her arms.   This is not necessarily how I wanted her to take medication but she thinks that it works as well this way as when she was taking more of the IR tablets.  No falls but some near falls getting up from the lift chair.  Pt denies lightheadedness, near syncope.  No hallucinations.  Mood has been  good.  The patient did go to [facility] and I reviewed notes.  She was started on Vesicare.  She states that it was great but insurance wouldn't pay for it so she was started on detrol.  It helps but not as much as vesicare.  Last visit we also ordered a hospital bed and she reports that she loves it and it has helped.  She has gained weight and plans to start new diet.  She stopped rock steady boxing because of cost  06/27/17 update:  Patient seen today in follow-up for her multiple system atrophy.  This patient is accompanied in the office by her daughter who supplements the history.  I tried to rework her medication last visit as she was having lots of cramping in her arms.  I thought  that was because she was taking the extended release during the day.  I told her to take either carbidopa/levodopa 25/100, 2 tablets 3 times per day or carbidopa/levodopa 25/100 one tablet 6 times per day in addition to her nighttime carbidopa/levodopa 50/200.  She states that she ended up with carbidopa/levodopa 25/100, 1 po 6 times per day but she is still having the cramping in the arms, and then feet at night.  Having more rigidity in the right shoulder and more dexterity issues with the right hand.  Pt denies falls.  Pt denies lightheadedness, near syncope.  No hallucinations.  Mood has been good.  She is having very vivid dreams and screams out in the middle of the night.  Feels that breathing is "laborious" and "hard to do."  But also denies SOB.  She does describe DOE - "I can talk my air out."  Is c/o swelling of feet and ankles.  Tried lasix in the past but had too much urination.  She denies any dysphagia but then tells me she occasionally will throw up food and thinks that is from her hiatal hernia.  Neuroimaging has previously been performed.  It is available for my review today.  PREVIOUS MEDICATIONS: Sinemet and Sinemet CR  ALLERGIES:   Allergies  Allergen Reactions  . Sulfa Antibiotics     Long time  ago and does not remember reaction    CURRENT MEDICATIONS:  Current Outpatient Prescriptions on File Prior to Visit  Medication Sig Dispense Refill  . acetaminophen (TYLENOL) 500 MG tablet Take 1,000 mg by mouth every 4 (four) hours as needed for moderate pain.     . carbidopa-levodopa (SINEMET CR) 50-200 MG tablet Take 1 tablet by mouth 2 (two) times daily. (Patient taking differently: Take 1 tablet by mouth at bedtime. ) 180 tablet 1  . carbidopa-levodopa (SINEMET IR) 25-100 MG tablet Take 1 tablet 6 times daily (Patient taking differently: Take 1 tablet by mouth 3 (three) times daily. ) 540 tablet 2  . cyclobenzaprine (FLEXERIL) 10 MG tablet Take 10 mg by mouth daily.  1  . diltiazem (CARTIA XT) 240 MG 24 hr capsule Take 1 capsule (240 mg total) by mouth daily. Office visit needed for refills 30 capsule 0  . losartan (COZAAR) 100 MG tablet Take 1 tablet (100 mg total) by mouth daily. 90 tablet 3  . omeprazole (PRILOSEC) 40 MG capsule Take 1 capsule (40 mg total) by mouth daily. (Patient taking differently: Take 80 mg by mouth daily. ) 90 capsule 3  . tolterodine (DETROL LA) 4 MG 24 hr capsule Take 4 mg by mouth daily.  0   No current facility-administered medications on file prior to visit.     PAST MEDICAL HISTORY:   Past Medical History:  Diagnosis Date  . Allergy   . Anemia   . Anxiety   . Arthritis    Left knee  . Edema extremities    lower extremity edema left greater than right.  Marland Kitchen GERD (gastroesophageal reflux disease)   . Heart murmur   . History of hiatal hernia    small  . Hx of seasonal allergies    occ. use OTC Mucinex or Antihistamine.  . Hyperlipidemia   . Hypertension   . Mobility impaired    ambulates with walker, tendency to lose balance.  . 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 Neuromuscular disorder ([place])    "multi system  atrophy disease"- "pain, bilateral foot numbness, left knee  pain"- Dr. Carles Collet is following.  . Shoulder disorder    right shoulder "rigidity due to Multi system atrophy"- ROM not limited"just tires me"    PAST SURGICAL HISTORY:   Past Surgical History:  Procedure Laterality Date  . ESOPHAGOGASTRODUODENOSCOPY ENDOSCOPY     with esophageal dilation  . FOOT SURGERY  2011   3rd metatarsal LT foot  . KNEE ARTHROSCOPY Left 02/08/2015   Procedure: LEFT KNEE ARTHROSCOPY ;  Surgeon: Latanya Maudlin, MD;  Location: [facility];  Service: Orthopedics;  Laterality: Left;  . TONSILLECTOMY AND ADENOIDECTOMY    . TOTAL KNEE ARTHROPLASTY Left 08/22/2015   Procedure: LEFT TOTAL  KNEE  ARTHROPLASTY;  Surgeon: Latanya Maudlin, MD;  Location: [facility];  Service: Orthopedics;  Laterality: Left;    SOCIAL HISTORY:   Social History   Social History  . Marital status: Divorced    Spouse name: N/A  . Number of children: 7  . Years of education: 90   Occupational History  . ACTIVITY COORDINATOR [employer]   Social History Main Topics  . Smoking status: Former Smoker    Packs/day: 1.00    Years: 12.00    Quit date: 03/14/1988  . Smokeless tobacco: Never Used  . Alcohol use No  . Drug use: No  . Sexual activity: Not on file   Other Topics Concern  . Not on file   Social History Narrative  . No narrative on file    FAMILY HISTORY:   Family Status  Relation Status  . Mother Deceased at age 42       diabetes, kidney failure, melanoma  . Father Deceased at age 67       COPD  . Brother Deceased       brain cancer  . Sister Alive       healthy  . Daughter Alive       healthy  . Daughter Alive       healthy  . Daughter Alive       healthy  . Daughter Alive       healthy  . Daughter Alive       healthy  . Son Alive       healthy  . Son Alive       healthy    ROS:  A complete 10 system review of systems was obtained and was unremarkable apart from what is mentioned above.  PHYSICAL EXAMINATION:    VITALS:   Vitals:   06/27/17  1419  BP: 136/84  Pulse: 98  SpO2: 97%  Weight: 246 lb (111.6 kg)  Height: 5\' 8"  (1.727 m)   Wt Readings from Last 3 Encounters:  06/27/17 246 lb (111.6 kg)  03/07/17 247 lb (112 kg)  11/12/16 232 lb (105.2 kg)     GEN:  The patient appears stated age and is in NAD. HEENT:  Normocephalic, atraumatic.  The mucous membranes are moist. The superficial temporal arteries are without ropiness or tenderness.  There is mild R hemifacial spasm CV:  RRR w 3/6 sem Lungs:  CTAB Neck/HEME:  There are no carotid bruits bilaterally.  Neurological examination:  Orientation: The patient is alert and oriented x3.  Cranial nerves: There is good facial symmetry.   The visual fields are full to confrontational testing. The speech is fluent and clear. Soft palate rises symmetrically and there is no tongue deviation. Hearing is intact to conversational tone. Sensation: Sensation is intact  to light touch throughout. Motor: Strength is 5/5 in the bilateral upper and lower extremities.   Shoulder shrug is equal and symmetric.  There is no pronator drift.   Movement examination: Tone: There is good tone today Abnormal movements: No tremor noted today.  There is mild dyskinesia of the L leg Coordination:  She had slowing of RAMs with finger taps and hand opening and closing on the right.  They were normal on the L Gait and Station: The patient gets out of the chair by pushing off the chair.  She walks with her walker  She flexes at the hip and pushes the rollator way ahead of her.  She was given a traditional walker and walks much better with more upright posture.    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      -continue carbidopa/levodopa 25/100, 1 po 6 times per day    -She will continue carbidopa/levodopa 50/200 at night  -she will be referred for PT/OT 2.  Hemifacial spasm, right  -MRI done within without gadolinium on 02/28/2016 demonstrated a known left sphenoid wing  meningioma that was stable.  It was otherwise unremarkable.  Reports that this has been better as of late  -it is a bit worse but she doesn't want botox 3.  Dysphagia.  -MBE was performed on 07/01/14 and it was normal and a regular, thin liquid diet was recommended.  She denies trouble with that but is throwing up food after eating.  Will repeat. 3.  Knee pain.    -She had left TKA on 08/23/15 and she is doing well. 4.  Urinary incontinence  -saw urology and started on vesicare and did well but insurance didn't pay.  Now on detrol. 5.  RBD  -will start klonopin - 0.5  Mg - 1/2 po q hs 6.  DOE  -could be related to [place] but will refer to pulmonology 7.  Peripheral edema  -could be due to calcium channel blocker and told her to ask PCP 8.   followup in the next 3 months, sooner should new neurologic issues arise.  Much greater than 50% of this visit was spent in counseling and coordinating care.  Total face to face time:  40 min (daughter never previously here and lots of issues addressed)

## 2017-06-27 ENCOUNTER — Ambulatory Visit (INDEPENDENT_AMBULATORY_CARE_PROVIDER_SITE_OTHER): Payer: Medicare Other | Admitting: Neurology

## 2017-06-27 ENCOUNTER — Encounter: Payer: Self-pay | Admitting: Neurology

## 2017-06-27 VITALS — BP 136/84 | HR 98 | Ht 68.0 in | Wt 246.0 lb

## 2017-06-27 DIAGNOSIS — G903 Multi-system degeneration of the autonomic nervous system: Secondary | ICD-10-CM

## 2017-06-27 DIAGNOSIS — G5139 Clonic hemifacial spasm, unspecified: Secondary | ICD-10-CM

## 2017-06-27 DIAGNOSIS — R0600 Dyspnea, unspecified: Secondary | ICD-10-CM

## 2017-06-27 DIAGNOSIS — G239 Degenerative disease of basal ganglia, unspecified: Secondary | ICD-10-CM

## 2017-06-27 DIAGNOSIS — R0609 Other forms of dyspnea: Secondary | ICD-10-CM | POA: Diagnosis not present

## 2017-06-27 DIAGNOSIS — G513 Clonic hemifacial spasm: Secondary | ICD-10-CM | POA: Diagnosis not present

## 2017-06-27 MED ORDER — CLONAZEPAM 0.5 MG PO TABS: 0.2500 mg | ORAL_TABLET | Freq: Every day | ORAL | 1 refills | Status: DC

## 2017-06-27 NOTE — Patient Instructions (Addendum)
1. You have been referred to [facility] for physical and occupational therapy. They will call you directly to schedule an appointment.  Please call [phone] if you do not hear from them.   2. Referral sent to [facility] to new patient evaluation. They should call you to schedule appt. They can be reached at [phone] if you do not hear from them.   3. Start Clonazepam 0.5 mg - 1/2 tablet at bedtime. Prescription sent to your pharmacy.

## 2017-07-02 ENCOUNTER — Encounter: Payer: Self-pay | Admitting: Family Medicine

## 2017-07-02 ENCOUNTER — Ambulatory Visit (INDEPENDENT_AMBULATORY_CARE_PROVIDER_SITE_OTHER): Payer: Medicare Other | Admitting: Family Medicine

## 2017-07-02 VITALS — BP 160/100 | HR 95 | Temp 98.1°F | Resp 18 | Ht 68.0 in | Wt 247.0 lb

## 2017-07-02 DIAGNOSIS — G239 Degenerative disease of basal ganglia, unspecified: Secondary | ICD-10-CM | POA: Diagnosis not present

## 2017-07-02 DIAGNOSIS — R269 Unspecified abnormalities of gait and mobility: Secondary | ICD-10-CM

## 2017-07-02 DIAGNOSIS — G513 Clonic hemifacial spasm: Secondary | ICD-10-CM | POA: Diagnosis not present

## 2017-07-02 DIAGNOSIS — R32 Unspecified urinary incontinence: Secondary | ICD-10-CM | POA: Diagnosis not present

## 2017-07-02 DIAGNOSIS — I10 Essential (primary) hypertension: Secondary | ICD-10-CM

## 2017-07-02 DIAGNOSIS — N3946 Mixed incontinence: Secondary | ICD-10-CM | POA: Diagnosis not present

## 2017-07-02 LAB — POCT URINALYSIS DIP (MANUAL ENTRY)
BILIRUBIN UA: NEGATIVE
BILIRUBIN UA: NEGATIVE mg/dL
Blood, UA: NEGATIVE
Glucose, UA: NEGATIVE mg/dL
LEUKOCYTES UA: NEGATIVE
Nitrite, UA: NEGATIVE
PH UA: 5.5 (ref 5.0–8.0)
Protein Ur, POC: NEGATIVE mg/dL
Spec Grav, UA: 1.02 (ref 1.010–1.025)
Urobilinogen, UA: 0.2 E.U./dL

## 2017-07-02 LAB — POC MICROSCOPIC URINALYSIS (UMFC): MUCUS RE: ABSENT

## 2017-07-02 MED ORDER — ATENOLOL 50 MG PO TABS: 50.0000 mg | ORAL_TABLET | Freq: Every day | ORAL | 0 refills | Status: DC

## 2017-07-02 MED ORDER — IRBESARTAN 300 MG PO TABS: 300.0000 mg | ORAL_TABLET | Freq: Every day | ORAL | 0 refills | Status: DC

## 2017-07-02 NOTE — Progress Notes (Signed)
Subjective:  By signing my name below, I, Moises Blood, attest that this documentation has been prepared under the direction and in the presence of [name], MD. Electronically Signed: Moises Blood, [place]. 07/02/2017 , 1:56 PM .  Patient was seen in Room 1 .   Patient ID: Jacqueline Atkinson, female    DOB: [birth year], 61 y.o.   MRN: [id number] Chief Complaint  Patient presents with  . Follow-up    Hypertension   HPI Jacqueline Atkinson is a 61 y.o. female who presents to Primary Care at [facility] for hypertension check up. She was seen by neurologist several times, last seen a week ago, but her BP well controlled. She checks her BP at home. She's been struggling with pedal edema for she was on Lasix 40mg  with potassium and compression socks; although, she's had difficulty putting them on. She noted to her neurologist there has been more swelling in feet and ankles and not using Lasix as caused too much urinary frequency. She is being sent for repeat swallow study due to occasional emesis after eating. She's on Detrol for urinary incontinence having worsening dyspnea on exertion.   HTN: her home BP readings, different times of the day measured.  Dating back to March, 160-170s/90-110s July, 200-210s/100-110s 2 weeks ago, 145/87  Pedal edema: She notes her feet getting very swollen as day goes on, but better in the mornings. She's been off of her Cartia for about 2 weeks now, but swelling improved since. She's been wearing her house slippers throughout the day. She still has some furosemide at home, but sees no improvement, and causes her to have urinary frequency. She prefers not to be on it due to inconvenience of getting to the bathroom. If she continues to have increased frequency, she might require help at home. She's also stopped taking her supplements.   Past Medical History:  Diagnosis Date  . Allergy   . Anemia   . Anxiety   . Arthritis    Left knee  . Edema extremities    lower extremity  edema left greater than right.  Marland Kitchen GERD (gastroesophageal reflux disease)   . Heart murmur   . History of hiatal hernia    small  . Hx of seasonal allergies    occ. use OTC Mucinex or Antihistamine.  . Hyperlipidemia   . Hypertension   . Mobility impaired    ambulates with walker, tendency to lose balance.  . 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 Neuromuscular disorder ([place])    "multi system atrophy disease"- "pain, bilateral foot numbness, left knee pain"- Dr. Carles Collet is following.  . Shoulder disorder    right shoulder "rigidity due to Multi system atrophy"- ROM not limited"just tires me"   Prior to Admission medications   Medication Sig Start Date End Date Taking? Authorizing Provider  acetaminophen (TYLENOL) 500 MG tablet Take 1,000 mg by mouth every 4 (four) hours as needed for moderate pain.    Yes [provider]  carbidopa-levodopa (SINEMET CR) 50-200 MG tablet Take 1 tablet by mouth 2 (two) times daily. Patient taking differently: Take 1 tablet by mouth at bedtime.  11/12/16  Yes Tat, Eustace Quail, DO  carbidopa-levodopa (SINEMET IR) 25-100 MG tablet Take 1 tablet 6 times daily Patient taking differently: Take 1 tablet by mouth 3 (three) times daily.  06/14/16  Yes Tat, Eustace Quail, DO  clonazePAM (KLONOPIN) 0.5 MG tablet Take 0.5 tablets (  0.25 mg total) by mouth at bedtime. 06/27/17  Yes Tat, Eustace Quail, DO  cyclobenzaprine (FLEXERIL) 10 MG tablet Take 10 mg by mouth daily. 03/02/17  Yes [provider]  diltiazem (CARTIA XT) 240 MG 24 hr capsule Take 1 capsule (240 mg total) by mouth daily. Office visit needed for refills 04/15/17  Yes Shawnee Knapp, MD  losartan (COZAAR) 100 MG tablet Take 1 tablet (100 mg total) by mouth daily. 04/23/16  Yes Shawnee Knapp, MD  omeprazole (PRILOSEC) 40 MG capsule Take 1 capsule (40 mg total) by mouth daily. Patient taking differently: Take 80 mg by mouth daily.  04/23/16  Yes  Shawnee Knapp, MD  tolterodine (DETROL LA) 4 MG 24 hr capsule Take 4 mg by mouth daily. 02/12/17  Yes [provider]   Allergies  Allergen Reactions  . Sulfa Antibiotics     Long time ago and does not remember reaction   Past Surgical History:  Procedure Laterality Date  . ESOPHAGOGASTRODUODENOSCOPY ENDOSCOPY     with esophageal dilation  . FOOT SURGERY  2011   3rd metatarsal LT foot  . KNEE ARTHROSCOPY Left 02/08/2015   Procedure: LEFT KNEE ARTHROSCOPY ;  Surgeon: Latanya Maudlin, MD;  Location: [facility];  Service: Orthopedics;  Laterality: Left;  . TONSILLECTOMY AND ADENOIDECTOMY    . TOTAL KNEE ARTHROPLASTY Left 08/22/2015   Procedure: LEFT TOTAL  KNEE  ARTHROPLASTY;  Surgeon: Latanya Maudlin, MD;  Location: [facility];  Service: Orthopedics;  Laterality: Left;   Family History  Problem Relation Age of Onset  . Cancer Mother   . Diabetes Mother   . Kidney disease Mother   . COPD Father   . Brain cancer Brother    Social History   Social History  . Marital status: Divorced    Spouse name: N/A  . Number of children: 7  . Years of education: 12   Occupational History  . ACTIVITY COORDINATOR [employer]   Social History Main Topics  . Smoking status: Former Smoker    Packs/day: 1.00    Years: 12.00    Quit date: 03/14/1988  . Smokeless tobacco: Never Used  . Alcohol use No  . Drug use: No  . Sexual activity: Not Asked   Other Topics Concern  . None   Social History Narrative  . None   Depression screen [facility] 2/9 07/02/2017 04/23/2016 10/06/2015 07/24/2015 04/21/2015  Decreased Interest 0 0 0 0 0  Down, Depressed, Hopeless 0 0 0 0 0  PHQ - 2 Score 0 0 0 0 0    Review of Systems  Constitutional: Negative for chills, fatigue, fever and unexpected weight change.  Respiratory: Negative for cough, chest tightness and shortness of breath.   Cardiovascular: Positive for leg swelling. Negative for chest pain and palpitations.  Gastrointestinal: Negative for  abdominal pain, blood in stool, constipation, diarrhea, nausea and vomiting.  Musculoskeletal: Positive for gait problem.  Skin: Negative for rash and wound.  Neurological: Negative for dizziness, syncope, weakness, light-headedness and headaches.       Objective:   Physical Exam  Constitutional: She is oriented to person, place, and time. She appears well-developed and well-nourished. No distress.  HENT:  Head: Normocephalic and atraumatic.  Eyes: Pupils are equal, round, and reactive to light. EOM are normal.  Neck: Neck supple.  Cardiovascular: Normal rate.   Murmur (left sternal border) heard.  Decrescendo systolic (pansystolic) murmur is present  Occasional skip beats  Pulmonary/Chest: Effort normal. No  respiratory distress.  Musculoskeletal: Normal range of motion.  2+ non pitting edema to the knees  Neurological: She is alert and oriented to person, place, and time.  Skin: Skin is warm and dry.  Psychiatric: She has a normal mood and affect. Her behavior is normal.  Nursing note and vitals reviewed.   BP (!) 160/100 (BP Location: Right Arm, Patient Position: Sitting, Cuff Size: Large)   Pulse 95   Temp 98.1 F (36.7 C) (Oral)   Resp 18   Ht 5\' 8"  (1.727 m)   Wt 247 lb (112 kg)   SpO2 97%   BMI 37.56 kg/m      Results for orders placed or performed in visit on 07/02/17  Urine Culture  Result Value Ref Range   Urine Culture, Routine Final report    Organism ID, Bacteria Comment   POCT urinalysis dipstick  Result Value Ref Range   Color, UA yellow yellow   Clarity, UA clear clear   Glucose, UA negative negative mg/dL   Bilirubin, UA negative negative   Ketones, POC UA negative negative mg/dL   Spec Grav, UA 1.020 1.010 - 1.025   Blood, UA negative negative   pH, UA 5.5 5.0 - 8.0   Protein Ur, POC negative negative mg/dL   Urobilinogen, UA 0.2 0.2 or 1.0 E.U./dL   Nitrite, UA Negative Negative   Leukocytes, UA Negative Negative  POCT Microscopic Urinalysis  ([facility])  Result Value Ref Range   WBC,UR,HPF,POC None None WBC/hpf   RBC,UR,HPF,POC None None RBC/hpf   Bacteria Few (A) None, Too numerous to count   Mucus Absent Absent   Epithelial Cells, UR Per Microscopy None None, Too numerous to count cells/hpf    Assessment & Plan:   1. Essential hypertension   2. Gait difficulty   3. Mixed stress and urge urinary incontinence     Orders Placed This Encounter  Procedures  . Urine Culture  . POCT urinalysis dipstick  . POCT Microscopic Urinalysis ([facility])    Meds ordered this encounter  Medications  . irbesartan (AVAPRO) 300 MG tablet    Sig: Take 1 tablet (300 mg total) by mouth daily.    Dispense:  90 tablet    Refill:  0  . atenolol (TENORMIN) 50 MG tablet    Sig: Take 1 tablet (50 mg total) by mouth daily.    Dispense:  90 tablet    Refill:  0    I personally performed the services described in this documentation, which was scribed in my presence. The recorded information has been reviewed and considered, and addended by me as needed.   [name], M.D.  Primary Care at [facility] [address] [place],  [address] [phone] phone [phone] fax  07/05/17 12:39 AM

## 2017-07-02 NOTE — Patient Instructions (Addendum)
IF you received an x-ray today, you will receive an invoice from [facility]. Please contact [facility] at [phone] with questions or concerns regarding your invoice.   IF you received labwork today, you will receive an invoice from [facility]. Please contact [facility] at [phone] with questions or concerns regarding your invoice.   Our billing staff will not be able to assist you with questions regarding bills from these companies.  You will be contacted with the lab results as soon as they are available. The fastest way to get your results is to activate your My Chart account. Instructions are located on the last page of this paperwork. If you have not heard from [place] regarding the results in 2 weeks, please contact this office.     Peripheral Edema Peripheral edema is swelling that is caused by a buildup of fluid. Peripheral edema most often affects the lower legs, ankles, and feet. It can also develop in the arms, hands, and face. The area of the body that has peripheral edema will look swollen. It may also feel heavy or warm. Your clothes may start to feel tight. Pressing on the area may make a temporary dent in your skin. You may not be able to move your arm or leg as much as usual. There are many causes of peripheral edema. It can be a complication of other diseases, such as congestive heart failure, kidney disease, or a problem with your blood circulation. It also can be a side effect of certain medicines. It often happens to women during pregnancy. Sometimes, the cause is not known. Treating the underlying condition is often the only treatment for peripheral edema. Follow these instructions at home: Pay attention to any changes in your symptoms. Take these actions to help with your discomfort:  Raise (elevate) your legs while you are sitting or lying down.  Move around often to prevent stiffness and to lessen swelling. Do not sit or stand for long periods of  time.  Wear support stockings as told by your health care provider.  Follow instructions from your health care provider about limiting salt (sodium) in your diet. Sometimes eating less salt can reduce swelling.  Take over-the-counter and prescription medicines only as told by your health care provider. Your health care provider may prescribe medicine to help your body get rid of excess water (diuretic).  Keep all follow-up visits as told by your health care provider. This is important.  Contact a health care provider if:  You have a fever.  Your edema starts suddenly or is getting worse, especially if you are pregnant or have a medical condition.  You have swelling in only one leg.  You have increased swelling and pain in your legs. Get help right away if:  You develop shortness of breath, especially when you are lying down.  You have pain in your chest or abdomen.  You feel weak.  You faint. This information is not intended to replace advice given to you by your health care provider. Make sure you discuss any questions you have with your health care provider. Document Released: 11/21/2004 Document Revised: 03/18/2016 Document Reviewed: 04/26/2015 Elsevier Interactive Patient Education  2018 [name].  Managing Your Hypertension Hypertension is commonly called high blood pressure. This is when the force of your blood pressing against the walls of your arteries is too strong. Arteries are blood vessels that carry blood from your heart throughout your body. Hypertension forces the heart to work harder to pump  blood, and may cause the arteries to become narrow or stiff. Having untreated or uncontrolled hypertension can cause heart attack, stroke, kidney disease, and other problems. What are blood pressure readings? A blood pressure reading consists of a higher number over a lower number. Ideally, your blood pressure should be below 120/80. The first ("top") number is called the  systolic pressure. It is a measure of the pressure in your arteries as your heart beats. The second ("bottom") number is called the diastolic pressure. It is a measure of the pressure in your arteries as the heart relaxes. What does my blood pressure reading mean? Blood pressure is classified into four stages. Based on your blood pressure reading, your health care provider may use the following stages to determine what type of treatment you need, if any. Systolic pressure and diastolic pressure are measured in a unit called mm Hg. Normal  Systolic pressure: below 147.  Diastolic pressure: below 80. Elevated  Systolic pressure: 829-562.  Diastolic pressure: below 80. Hypertension stage 1  Systolic pressure: 130-865.  Diastolic pressure: 78-46. Hypertension stage 2  Systolic pressure: 962 or above.  Diastolic pressure: 90 or above. What health risks are associated with hypertension? Managing your hypertension is an important responsibility. Uncontrolled hypertension can lead to:  A heart attack.  A stroke.  A weakened blood vessel (aneurysm).  Heart failure.  Kidney damage.  Eye damage.  Metabolic syndrome.  Memory and concentration problems.  What changes can I make to manage my hypertension? Hypertension can be managed by making lifestyle changes and possibly by taking medicines. Your health care provider will help you make a plan to bring your blood pressure within a normal range. Eating and drinking  Eat a diet that is high in fiber and potassium, and low in salt (sodium), added sugar, and fat. An example eating plan is called the DASH (Dietary Approaches to Stop Hypertension) diet. To eat this way: ? Eat plenty of fresh fruits and vegetables. Try to fill half of your plate at each meal with fruits and vegetables. ? Eat whole grains, such as whole wheat pasta, brown rice, or whole grain bread. Fill about one quarter of your plate with whole grains. ? Eat low-fat  diary products. ? Avoid fatty cuts of meat, processed or cured meats, and poultry with skin. Fill about one quarter of your plate with lean proteins such as fish, chicken without skin, beans, eggs, and tofu. ? Avoid premade and processed foods. These tend to be higher in sodium, added sugar, and fat.  Reduce your daily sodium intake. Most people with hypertension should eat less than 1,500 mg of sodium a day.  Limit alcohol intake to no more than 1 drink a day for nonpregnant women and 2 drinks a day for men. One drink equals 12 oz of beer, 5 oz of wine, or 1 oz of hard liquor. Lifestyle  Work with your health care provider to maintain a healthy body weight, or to lose weight. Ask what an ideal weight is for you.  Get at least 30 minutes of exercise that causes your heart to beat faster (aerobic exercise) most days of the week. Activities may include walking, swimming, or biking.  Include exercise to strengthen your muscles (resistance exercise), such as weight lifting, as part of your weekly exercise routine. Try to do these types of exercises for 30 minutes at least 3 days a week.  Do not use any products that contain nicotine or tobacco, such as cigarettes and e-cigarettes.  If you need help quitting, ask your health care provider.  Control any long-term (chronic) conditions you have, such as high cholesterol or diabetes. Monitoring  Monitor your blood pressure at home as told by your health care provider. Your personal target blood pressure may vary depending on your medical conditions, your age, and other factors.  Have your blood pressure checked regularly, as often as told by your health care provider. Working with your health care provider  Review all the medicines you take with your health care provider because there may be side effects or interactions.  Talk with your health care provider about your diet, exercise habits, and other lifestyle factors that may be contributing to  hypertension.  Visit your health care provider regularly. Your health care provider can help you create and adjust your plan for managing hypertension. Will I need medicine to control my blood pressure? Your health care provider may prescribe medicine if lifestyle changes are not enough to get your blood pressure under control, and if:  Your systolic blood pressure is 130 or higher.  Your diastolic blood pressure is 80 or higher.  Take medicines only as told by your health care provider. Follow the directions carefully. Blood pressure medicines must be taken as prescribed. The medicine does not work as well when you skip doses. Skipping doses also puts you at risk for problems. Contact a health care provider if:  You think you are having a reaction to medicines you have taken.  You have repeated (recurrent) headaches.  You feel dizzy.  You have swelling in your ankles.  You have trouble with your vision. Get help right away if:  You develop a severe headache or confusion.  You have unusual weakness or numbness, or you feel faint.  You have severe pain in your chest or abdomen.  You vomit repeatedly.  You have trouble breathing. Summary  Hypertension is when the force of blood pumping through your arteries is too strong. If this condition is not controlled, it may put you at risk for serious complications.  Your personal target blood pressure may vary depending on your medical conditions, your age, and other factors. For most people, a normal blood pressure is less than 120/80.  Hypertension is managed by lifestyle changes, medicines, or both. Lifestyle changes include weight loss, eating a healthy, low-sodium diet, exercising more, and limiting alcohol. This information is not intended to replace advice given to you by your health care provider. Make sure you discuss any questions you have with your health care provider. Document Released: 07/08/2012 Document Revised:  09/11/2016 Document Reviewed: 09/11/2016 Elsevier Interactive Patient Education  [employer].

## 2017-07-04 LAB — URINE CULTURE

## 2017-07-30 ENCOUNTER — Ambulatory Visit: Payer: Medicare Other | Admitting: Occupational Therapy

## 2017-07-30 ENCOUNTER — Ambulatory Visit: Payer: Medicare Other | Attending: Neurology | Admitting: Rehabilitative and Restorative Service Providers"

## 2017-07-30 VITALS — BP 178/111 | HR 68

## 2017-07-30 DIAGNOSIS — R278 Other lack of coordination: Secondary | ICD-10-CM | POA: Insufficient documentation

## 2017-07-30 DIAGNOSIS — R29818 Other symptoms and signs involving the nervous system: Secondary | ICD-10-CM | POA: Insufficient documentation

## 2017-07-30 DIAGNOSIS — R293 Abnormal posture: Secondary | ICD-10-CM | POA: Insufficient documentation

## 2017-07-30 DIAGNOSIS — R260 Ataxic gait: Secondary | ICD-10-CM | POA: Insufficient documentation

## 2017-07-30 DIAGNOSIS — R29898 Other symptoms and signs involving the musculoskeletal system: Secondary | ICD-10-CM | POA: Insufficient documentation

## 2017-07-30 DIAGNOSIS — R2681 Unsteadiness on feet: Secondary | ICD-10-CM | POA: Insufficient documentation

## 2017-07-30 DIAGNOSIS — R2689 Other abnormalities of gait and mobility: Secondary | ICD-10-CM | POA: Insufficient documentation

## 2017-07-30 NOTE — Therapy (Signed)
[facility] [address] [place], [name], [address] Phone: [phone]   Fax:  [phone]  Physical Therapy Encounter Note -- No Charge/ Treatment Ended due to medical issue. Patient Details  Name: Jacqueline Atkinson MRN: [id number] Date of Birth: 1956-10-25 Referring Provider: Alonza Bogus, DO  Encounter Date: 07/30/2017    Past Medical History:  Diagnosis Date  . Allergy   . Anemia   . Anxiety   . Arthritis    Left knee  . Edema extremities    lower extremity edema left greater than right.  Marland Kitchen GERD (gastroesophageal reflux disease)   . Heart murmur   . History of hiatal hernia    small  . Hx of seasonal allergies    occ. use OTC Mucinex or Antihistamine.  . Hyperlipidemia   . Hypertension   . Mobility impaired    ambulates with walker, tendency to lose balance.  . 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 Neuromuscular disorder ([place])    "multi system atrophy disease"- "pain, bilateral foot numbness, left knee pain"- Dr. Carles Collet is following.  . 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 ESOPHAGOGASTRODUODENOSCOPY ENDOSCOPY     with esophageal dilation  . FOOT SURGERY  2011   3rd metatarsal LT foot  . KNEE ARTHROSCOPY Left 02/08/2015   Procedure: LEFT KNEE ARTHROSCOPY ;  Surgeon: Latanya Maudlin, MD;  Location: [facility];  Service: Orthopedics;  Laterality: Left;  . TONSILLECTOMY AND ADENOIDECTOMY    . TOTAL KNEE ARTHROPLASTY Left 08/22/2015   Procedure: LEFT TOTAL  KNEE  ARTHROPLASTY;  Surgeon: Latanya Maudlin, MD;  Location: [facility];  Service: Orthopedics;  Laterality: Left;    Vitals:   07/30/17 0906  BP: (!) 178/111  Pulse: 68        Subjective Assessment - 07/30/17 0847    Subjective The patient reports dx of MSA  approximately 3 years ago.  She was walking with a rollater and noted that she was leaning anteriorly.  Dr. Carles Collet requested she switch to regular RW.  She is still using rollater intermittently because of needing a seat to prop plates on or sit on in community.  The patient notes that she has been writing a list for specific items to work on in therapy.     Pertinent History chronic back pain, L knee replacement 08/23/15, reflux.   Patient Stated Goals Maintain independence, be able to put on leggings (can't reach to pull them up), put on socks, shuffle and deal cards ("I've been practicing"); writing in kids' journals and being able to write noted to kids -- "writing is such an effort"; be able to put on makeup again, "filling my pill organizers", take 3 steps backwards to work in [employer], work on turning around, improve balance.   Pain in hips with standing.   Getting in and out of my Lucianne Lei.  Notes driving is getting harder due to right side rigidity.   Currently in Pain? Yes  headache all of the time due to high blood pressure   Effect of Pain on Daily Activities *PT to monitor vitals and headache.            [facility] PT Assessment - 07/30/17 0903      Assessment   Medical Diagnosis multiple systems atrophy  Referring Provider Wells Guiles Tat, DO   Onset Date/Surgical Date --  declining mobility worsening over past 3 years   Hand Dominance Right   Prior Therapy known to our clinic from prior participation in [facility] exercise classes     Precautions   Precautions Fall  check blood pressure     [place] residence     Prior Function   Vocation On disability                                         Plan - 07/30/17 0914    Clinical Impression Statement PT did not proceed with objective portion of evaluation due to hypertension and headache.  Patient to f/u with primary care and rescheduled visits for PT/OT with PT providing  recommendation to return once BP consistently under 180/100.      Patient will benefit from skilled therapeutic intervention in order to improve the following deficits and impairments:     Visit Diagnosis: Other abnormalities of gait and mobility     Carnie Bruemmer, PT 07/30/2017, 9:15 AM  [facility] [address] [place], [name], [address] Phone: [phone]   Fax:  [phone]  Name: Jacqueline Atkinson MRN: [id number] Date of Birth: 08-06-1956

## 2017-08-01 DIAGNOSIS — R269 Unspecified abnormalities of gait and mobility: Secondary | ICD-10-CM | POA: Diagnosis not present

## 2017-08-01 DIAGNOSIS — R32 Unspecified urinary incontinence: Secondary | ICD-10-CM | POA: Diagnosis not present

## 2017-08-01 DIAGNOSIS — G239 Degenerative disease of basal ganglia, unspecified: Secondary | ICD-10-CM | POA: Diagnosis not present

## 2017-08-01 DIAGNOSIS — G5139 Clonic hemifacial spasm, unspecified: Secondary | ICD-10-CM | POA: Diagnosis not present

## 2017-08-04 ENCOUNTER — Ambulatory Visit (INDEPENDENT_AMBULATORY_CARE_PROVIDER_SITE_OTHER): Payer: Medicare Other | Admitting: Family Medicine

## 2017-08-04 ENCOUNTER — Encounter: Payer: Self-pay | Admitting: Family Medicine

## 2017-08-04 VITALS — BP 160/90 | HR 75 | Temp 99.3°F | Resp 16 | Ht 68.0 in | Wt 255.8 lb

## 2017-08-04 DIAGNOSIS — R0989 Other specified symptoms and signs involving the circulatory and respiratory systems: Secondary | ICD-10-CM | POA: Diagnosis not present

## 2017-08-04 DIAGNOSIS — R600 Localized edema: Secondary | ICD-10-CM

## 2017-08-04 DIAGNOSIS — G238 Other specified degenerative diseases of basal ganglia: Secondary | ICD-10-CM | POA: Diagnosis not present

## 2017-08-04 DIAGNOSIS — I10 Essential (primary) hypertension: Secondary | ICD-10-CM | POA: Diagnosis not present

## 2017-08-04 DIAGNOSIS — G709 Myoneural disorder, unspecified: Secondary | ICD-10-CM | POA: Diagnosis not present

## 2017-08-04 MED ORDER — ATENOLOL 100 MG PO TABS: 100.0000 mg | ORAL_TABLET | Freq: Every day | ORAL | 0 refills | Status: DC

## 2017-08-04 MED ORDER — HYDRALAZINE HCL 25 MG PO TABS
25.0000 mg | ORAL_TABLET | Freq: Three times a day (TID) | ORAL | 3 refills | Status: DC
Start: 2017-08-04 — End: 2017-09-01

## 2017-08-04 NOTE — Progress Notes (Signed)
Subjective:     Patient ID: Jacqueline Atkinson, female    DOB: [birth year], 61 y.o.   MRN: [id number]   Chief Complaint  Patient presents with  . Follow-up    blood pressure   HPI Jacqueline Atkinson is a 61 y.o. female who presents to Primary Care at [facility] for hypertension check up. She was seen by neurologist several times, last seen a week ago, but her BP well controlled. She checks her BP at home. She's been struggling with pedal edema for she was on Lasix 40mg  with potassium and compression socks; although, she's had difficulty putting them on. She noted to her neurologist there has been more swelling in feet and ankles and not using Lasix as caused too much urinary frequency. She is being sent for repeat swallow study due to occasional emesis after eating. She's on Detrol for urinary incontinence having worsening dyspnea on exertion.   HTN: her home BP readings, different times of the day measured. Has NOT been checking her pulse. BP has been very high since stopping dilt and starting atenolol at last visit which is causing HAs but pedal edema MUCH better. Initially BP were 150s-160s but over the past 2 weeks have been 409W-119J systolic. occ up to 200s. Did get BP 110s or 130s originally but rare - feels fine when BP is lower.  Has to be <180/100 to do PT so she was not able to do PT this wk due to her BP upon arrival.  Pedal edema: Still puffy but much better. Has been able to wear shoes again which is great since being off the cartia.  Past Medical History:  Diagnosis Date  . Allergy   . Anemia   . Anxiety   . Arthritis    Left knee  . Edema extremities    lower extremity edema left greater than right.  Marland Kitchen GERD (gastroesophageal reflux disease)   . Heart murmur   . History of hiatal hernia    small  . Hx of seasonal allergies    occ. use OTC Mucinex or Antihistamine.  . Hyperlipidemia   . Hypertension   . Mobility impaired    ambulates with walker, tendency to lose balance.  .  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 Neuromuscular disorder ([place])    "multi system atrophy disease"- "pain, bilateral foot numbness, left knee pain"- Dr. Carles Collet is following.  . Shoulder disorder    right shoulder "rigidity due to Multi system atrophy"- ROM not limited"just tires me"   Prior to Admission medications   Medication Sig Start Date End Date Taking? Authorizing Provider  acetaminophen (TYLENOL) 500 MG tablet Take 1,000 mg by mouth every 4 (four) hours as needed for moderate pain.    Yes [provider]  carbidopa-levodopa (SINEMET CR) 50-200 MG tablet Take 1 tablet by mouth 2 (two) times daily. Patient taking differently: Take 1 tablet by mouth at bedtime.  11/12/16  Yes Tat, Eustace Quail, DO  carbidopa-levodopa (SINEMET IR) 25-100 MG tablet Take 1 tablet 6 times daily Patient taking differently: Take 1 tablet by mouth 3 (three) times daily.  06/14/16  Yes Tat, Eustace Quail, DO  clonazePAM (KLONOPIN) 0.5 MG tablet Take 0.5 tablets (0.25 mg total) by mouth at bedtime. 06/27/17  Yes Tat, Eustace Quail, DO  cyclobenzaprine (FLEXERIL) 10 MG tablet Take 10 mg by mouth daily. 03/02/17  Yes [provider]  diltiazem (CARTIA XT) 240 MG  24 hr capsule Take 1 capsule (240 mg total) by mouth daily. Office visit needed for refills 04/15/17  Yes Shawnee Knapp, MD  losartan (COZAAR) 100 MG tablet Take 1 tablet (100 mg total) by mouth daily. 04/23/16  Yes Shawnee Knapp, MD  omeprazole (PRILOSEC) 40 MG capsule Take 1 capsule (40 mg total) by mouth daily. Patient taking differently: Take 80 mg by mouth daily.  04/23/16  Yes Shawnee Knapp, MD  tolterodine (DETROL LA) 4 MG 24 hr capsule Take 4 mg by mouth daily. 02/12/17  Yes [provider]   Allergies  Allergen Reactions  . Sulfa Antibiotics     Long time ago and does not remember reaction   Past Surgical History:  Procedure Laterality Date  . ESOPHAGOGASTRODUODENOSCOPY  ENDOSCOPY     with esophageal dilation  . FOOT SURGERY  2011   3rd metatarsal LT foot  . KNEE ARTHROSCOPY Left 02/08/2015   Procedure: LEFT KNEE ARTHROSCOPY ;  Surgeon: Latanya Maudlin, MD;  Location: [facility];  Service: Orthopedics;  Laterality: Left;  . TONSILLECTOMY AND ADENOIDECTOMY    . TOTAL KNEE ARTHROPLASTY Left 08/22/2015   Procedure: LEFT TOTAL  KNEE  ARTHROPLASTY;  Surgeon: Latanya Maudlin, MD;  Location: [facility];  Service: Orthopedics;  Laterality: Left;   Family History  Problem Relation Age of Onset  . Cancer Mother   . Diabetes Mother   . Kidney disease Mother   . COPD Father   . Brain cancer Brother    Social History   Social History  . Marital status: Divorced    Spouse name: N/A  . Number of children: 7  . Years of education: 59   Occupational History  . ACTIVITY COORDINATOR [employer]   Social History Main Topics  . Smoking status: Former Smoker    Packs/day: 1.00    Years: 12.00    Quit date: 03/14/1988  . Smokeless tobacco: Never Used  . Alcohol use No  . Drug use: No  . Sexual activity: Not on file   Other Topics Concern  . Not on file   Social History Narrative  . No narrative on file   Depression screen [facility] 2/9 08/04/2017 07/02/2017 04/23/2016 10/06/2015 07/24/2015  Decreased Interest 0 0 0 0 0  Down, Depressed, Hopeless 0 0 0 0 0  PHQ - 2 Score 0 0 0 0 0    Review of Systems  Constitutional: Negative for chills, fatigue, fever and unexpected weight change.  Respiratory: Negative for cough, chest tightness and shortness of breath.   Cardiovascular: Positive for leg swelling. Negative for chest pain and palpitations.  Gastrointestinal: Negative for abdominal pain, blood in stool, constipation, diarrhea, nausea and vomiting.  Musculoskeletal: Positive for gait problem.  Skin: Negative for rash and wound.  Neurological: Positive for headaches. Negative for dizziness, syncope, weakness and light-headedness.       Objective:    Physical Exam  Constitutional: She is oriented to person, place, and time. She appears well-developed and well-nourished. No distress.  HENT:  Head: Normocephalic and atraumatic.  Eyes: Pupils are equal, round, and reactive to light. EOM are normal.  Neck: Neck supple.  Cardiovascular: Normal rate.   Murmur (left sternal border) heard.  Decrescendo systolic (pansystolic) murmur is present  Occasional skip beats  Pulmonary/Chest: Effort normal. No respiratory distress.  Musculoskeletal: Normal range of motion.  2+ non pitting edema to the knees  Neurological: She is alert and oriented to person, place, and time.  Skin:  Skin is warm and dry.  Psychiatric: She has a normal mood and affect. Her behavior is normal.  Nursing note and vitals reviewed.   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 Consider taking an extra hydralazine 60 min prior to PT to ensure she will be able to participate.  Wonder if such labile, high BP could be a part of the mult system atrophy C disease.  2. Labile blood pressure   3. Pedal edema   4. 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 Neuromuscular disease ([place])     Orders Placed This Encounter  Procedures  . Comprehensive metabolic panel  . TSH    Meds ordered this encounter  Medications  . hydrALAZINE (APRESOLINE) 25 MG tablet    Sig: Take 1 tablet (25 mg total) by mouth 3 (three) times daily. Increase to qid if needed for blood pressure >160/90 (either).    Dispense:  120 tablet    Refill:  3  . atenolol (TENORMIN) 100 MG tablet    Sig: Take 1 tablet (100 mg total) by mouth daily.    Dispense:  90 tablet    Refill:  0    [name],  M.D.  Primary Care at [facility] [address] [place],  [address] [phone] phone [phone] fax  08/06/17 12:14 AM

## 2017-08-04 NOTE — Patient Instructions (Addendum)
   IF you received an x-ray today, you will receive an invoice from [facility]. Please contact [facility] at [phone] with questions or concerns regarding your invoice.   IF you received labwork today, you will receive an invoice from [facility]. Please contact [facility] at [phone] with questions or concerns regarding your invoice.   Our billing staff will not be able to assist you with questions regarding bills from these companies.  You will be contacted with the lab results as soon as they are available. The fastest way to get your results is to activate your My Chart account. Instructions are located on the last page of this paperwork. If you have not heard from us regarding the results in 2 weeks, please contact this office.      Managing Your Hypertension Hypertension is commonly called high blood pressure. This is when the force of your blood pressing against the walls of your arteries is too strong. Arteries are blood vessels that carry blood from your heart throughout your body. Hypertension forces the heart to work harder to pump blood, and may cause the arteries to become narrow or stiff. Having untreated or uncontrolled hypertension can cause heart attack, stroke, kidney disease, and other problems. What are blood pressure readings? A blood pressure reading consists of a higher number over a lower number. Ideally, your blood pressure should be below 120/80. The first ("top") number is called the systolic pressure. It is a measure of the pressure in your arteries as your heart beats. The second ("bottom") number is called the diastolic pressure. It is a measure of the pressure in your arteries as the heart relaxes. What does my blood pressure reading mean? Blood pressure is classified into four stages. Based on your blood pressure reading, your health care provider may use the following stages to determine what type of treatment you need, if any. Systolic  pressure and diastolic pressure are measured in a unit called mm Hg. Normal  Systolic pressure: below 120.  Diastolic pressure: below 80. Elevated  Systolic pressure: 120-129.  Diastolic pressure: below 80. Hypertension stage 1  Systolic pressure: 130-139.  Diastolic pressure: 80-89. Hypertension stage 2  Systolic pressure: 140 or above.  Diastolic pressure: 90 or above. What health risks are associated with hypertension? Managing your hypertension is an important responsibility. Uncontrolled hypertension can lead to:  A heart attack.  A stroke.  A weakened blood vessel (aneurysm).  Heart failure.  Kidney damage.  Eye damage.  Metabolic syndrome.  Memory and concentration problems.  What changes can I make to manage my hypertension? Hypertension can be managed by making lifestyle changes and possibly by taking medicines. Your health care provider will help you make a plan to bring your blood pressure within a normal range. Eating and drinking  Eat a diet that is high in fiber and potassium, and low in salt (sodium), added sugar, and fat. An example eating plan is called the DASH (Dietary Approaches to Stop Hypertension) diet. To eat this way: ? Eat plenty of fresh fruits and vegetables. Try to fill half of your plate at each meal with fruits and vegetables. ? Eat whole grains, such as whole wheat pasta, brown rice, or whole grain bread. Fill about one quarter of your plate with whole grains. ? Eat low-fat diary products. ? Avoid fatty cuts of meat, processed or cured meats, and poultry with skin. Fill about one quarter of your plate with lean proteins such as fish, chicken without skin, beans, eggs,   and tofu. ? Avoid premade and processed foods. These tend to be higher in sodium, added sugar, and fat.  Reduce your daily sodium intake. Most people with hypertension should eat less than 1,500 mg of sodium a day.  Limit alcohol intake to no more than 1 drink a day  for nonpregnant women and 2 drinks a day for men. One drink equals 12 oz of beer, 5 oz of wine, or 1 oz of hard liquor. Lifestyle  Work with your health care provider to maintain a healthy body weight, or to lose weight. Ask what an ideal weight is for you.  Get at least 30 minutes of exercise that causes your heart to beat faster (aerobic exercise) most days of the week. Activities may include walking, swimming, or biking.  Include exercise to strengthen your muscles (resistance exercise), such as weight lifting, as part of your weekly exercise routine. Try to do these types of exercises for 30 minutes at least 3 days a week.  Do not use any products that contain nicotine or tobacco, such as cigarettes and e-cigarettes. If you need help quitting, ask your health care provider.  Control any long-term (chronic) conditions you have, such as high cholesterol or diabetes. Monitoring  Monitor your blood pressure at home as told by your health care provider. Your personal target blood pressure may vary depending on your medical conditions, your age, and other factors.  Have your blood pressure checked regularly, as often as told by your health care provider. Working with your health care provider  Review all the medicines you take with your health care provider because there may be side effects or interactions.  Talk with your health care provider about your diet, exercise habits, and other lifestyle factors that may be contributing to hypertension.  Visit your health care provider regularly. Your health care provider can help you create and adjust your plan for managing hypertension. Will I need medicine to control my blood pressure? Your health care provider may prescribe medicine if lifestyle changes are not enough to get your blood pressure under control, and if:  Your systolic blood pressure is 130 or higher.  Your diastolic blood pressure is 80 or higher.  Take medicines only as told  by your health care provider. Follow the directions carefully. Blood pressure medicines must be taken as prescribed. The medicine does not work as well when you skip doses. Skipping doses also puts you at risk for problems. Contact a health care provider if:  You think you are having a reaction to medicines you have taken.  You have repeated (recurrent) headaches.  You feel dizzy.  You have swelling in your ankles.  You have trouble with your vision. Get help right away if:  You develop a severe headache or confusion.  You have unusual weakness or numbness, or you feel faint.  You have severe pain in your chest or abdomen.  You vomit repeatedly.  You have trouble breathing. Summary  Hypertension is when the force of blood pumping through your arteries is too strong. If this condition is not controlled, it may put you at risk for serious complications.  Your personal target blood pressure may vary depending on your medical conditions, your age, and other factors. For most people, a normal blood pressure is less than 120/80.  Hypertension is managed by lifestyle changes, medicines, or both. Lifestyle changes include weight loss, eating a healthy, low-sodium diet, exercising more, and limiting alcohol. This information is not intended to replace advice   given to you by your health care provider. Make sure you discuss any questions you have with your health care provider. Document Released: 07/08/2012 Document Revised: 09/11/2016 Document Reviewed: 09/11/2016 Elsevier Interactive Patient Education  2018 Elsevier Inc.  

## 2017-08-05 ENCOUNTER — Encounter: Payer: Self-pay | Admitting: Internal Medicine

## 2017-08-05 ENCOUNTER — Ambulatory Visit (INDEPENDENT_AMBULATORY_CARE_PROVIDER_SITE_OTHER)
Admission: RE | Admit: 2017-08-05 | Discharge: 2017-08-05 | Disposition: A | Discharge status: Home / Self Care | Payer: Medicare Other | Source: Ambulatory Visit | Attending: Internal Medicine | Admitting: Internal Medicine

## 2017-08-05 ENCOUNTER — Ambulatory Visit (INDEPENDENT_AMBULATORY_CARE_PROVIDER_SITE_OTHER): Payer: Medicare Other | Admitting: Internal Medicine

## 2017-08-05 ENCOUNTER — Other Ambulatory Visit (INDEPENDENT_AMBULATORY_CARE_PROVIDER_SITE_OTHER): Payer: Medicare Other

## 2017-08-05 VITALS — BP 152/94 | HR 66 | Ht 68.0 in | Wt 255.4 lb

## 2017-08-05 DIAGNOSIS — R0609 Other forms of dyspnea: Secondary | ICD-10-CM

## 2017-08-05 DIAGNOSIS — R0600 Dyspnea, unspecified: Secondary | ICD-10-CM

## 2017-08-05 DIAGNOSIS — R0602 Shortness of breath: Secondary | ICD-10-CM | POA: Diagnosis not present

## 2017-08-05 DIAGNOSIS — I10 Essential (primary) hypertension: Secondary | ICD-10-CM | POA: Diagnosis not present

## 2017-08-05 LAB — COMPREHENSIVE METABOLIC PANEL
ALBUMIN: 4.6 g/dL (ref 3.6–4.8)
ALK PHOS: 128 IU/L — AB (ref 39–117)
ALT: 6 IU/L (ref 0–32)
AST: 13 IU/L (ref 0–40)
Albumin/Globulin Ratio: 2 (ref 1.2–2.2)
BUN / CREAT RATIO: 15 (ref 12–28)
BUN: 12 mg/dL (ref 8–27)
Bilirubin Total: <0.2 mg/dL (ref 0.0–1.2)
CO2: 24 mmol/L (ref 20–29)
CREATININE: 0.78 mg/dL (ref 0.57–1.00)
Calcium: 9.1 mg/dL (ref 8.7–10.3)
Chloride: 105 mmol/L (ref 96–106)
GFR calc non Af Amer: 82 mL/min/{1.73_m2} (ref >59)
GFR, EST AFRICAN AMERICAN: 95 mL/min/{1.73_m2} (ref >59)
GLUCOSE: 91 mg/dL (ref 65–99)
Globulin, Total: 2.3 g/dL (ref 1.5–4.5)
Potassium: 4 mmol/L (ref 3.5–5.2)
Sodium: 143 mmol/L (ref 134–144)
TOTAL PROTEIN: 6.9 g/dL (ref 6.0–8.5)

## 2017-08-05 LAB — HEPATIC FUNCTION PANEL
ALT: 5 U/L (ref 0–35)
AST: 12 U/L (ref 0–37)
Albumin: 4.7 g/dL (ref 3.5–5.2)
Alkaline Phosphatase: 114 U/L (ref 39–117)
BILIRUBIN DIRECT: 0.1 mg/dL (ref 0.0–0.3)
BILIRUBIN TOTAL: 0.4 mg/dL (ref 0.2–1.2)
Total Protein: 7.5 g/dL (ref 6.0–8.3)

## 2017-08-05 LAB — BRAIN NATRIURETIC PEPTIDE: PRO B NATRI PEPTIDE: 123 pg/mL — AB (ref 0.0–100.0)

## 2017-08-05 LAB — TSH: TSH: 0.803 u[IU]/mL (ref 0.450–4.500)

## 2017-08-05 NOTE — Patient Instructions (Addendum)
Try changing prilosec to where you take it 40 mg x  30- 60 min before your first and last meals of the day   GERD (REFLUX)  is an extremely common cause of respiratory symptoms just like yours , many times with no obvious heartburn at all.    It can be treated with medication, but also with lifestyle changes including elevation of the head of your bed (ideally with 6 inch  bed blocks),  Smoking cessation, avoidance of late meals, excessive alcohol, and avoid fatty foods, chocolate, peppermint, colas, red wine, and acidic juices such as orange juice.  NO MINT OR MENTHOL PRODUCTS SO NO COUGH DROPS   USE SUGARLESS CANDY INSTEAD (Jolley ranchers or Stover's or Life Savers) or even ice chips will also do - the key is to swallow to prevent all throat clearing. NO OIL BASED VITAMINS - use powdered substitutes.    Please remember to go to the lab and x-ray department downstairs in the basement  for your tests - we will call you with the results when they are available    Please schedule a follow up office visit in 6 weeks, call sooner if needed with full pfts and MIP / MEP at [facility] first  - add :  Radiology concerned pt needs additonal GI w/u - will review with pt on f/u ov after tries new way to rx with ppi as per instructions

## 2017-08-05 NOTE — Progress Notes (Addendum)
Subjective:    Patient ID: Jacqueline Atkinson, female    DOB: 1956-05-11,     MRN: [id number]  HPI  83 yowf  Quit smoking in 1989 with wt issues/ hbp >  very active hiking/ kayacking @ wt  200lb around 2012  then balance/ortho  issues  Around 2013 then in summer 2018  Pt started having periods of tachypnea at rest x 10 min and using scooter to do most shopping and doe x across parking lot and no longer able to do HT (last time s scooter x early 2018 due more to issues with  balance/hips) so referred to pulmonary clinic 08/05/2017 by Dr   Tat who is following her for multiple system atrophy.    08/05/2017 1st [facility] office visit/ Nakhia Levitan   Chief Complaint  Patient presents with  . Pulmonary Consult    SOB 2-3 months, exertion make sit worse, dr Tat referred her   onset sob at rest  In paroxyms starting 05/2017 and lasting x 10 min once a week ov avg  and resolves spont assoc with tingling in fingers, no loss of voice or cough or cp  assoc with dx of GERD taking ppi 2 at  [facility] o/w reports very severe gerd but sleeps fine on side if takes pm 80 mg prilosec at hs and has never tried taking it any other way     No obvious day to day or daytime variability or assoc excess/ purulent sputum or mucus plugs or hemoptysis or cp or chest tightness, subjective wheeze or overt sinus or hb symptoms. No unusual exp hx or h/o childhood pna/ asthma or knowledge of premature birth.  Sleeping ok flat without nocturnal  or early am exacerbation  of respiratory  c/o's or need for noct saba. Also denies any obvious fluctuation of symptoms with weather or environmental changes or other aggravating or alleviating factors except as outlined above   Current Allergies, Complete Past Medical History, Past Surgical History, Family History, and Social History were reviewed in [employer] record.           Current Meds  Medication Sig  . acetaminophen (TYLENOL) 500 MG tablet Take 1,000 mg by  mouth every 4 (four) hours as needed for moderate pain.   Marland Kitchen atenolol (TENORMIN) 100 MG tablet Take 1 tablet (100 mg total) by mouth daily.  . carbidopa-levodopa (SINEMET CR) 50-200 MG tablet Take 1 tablet by mouth 2 (two) times daily. (Patient taking differently: Take 1 tablet by mouth at bedtime. )  . carbidopa-levodopa (SINEMET IR) 25-100 MG tablet Take 1 tablet 6 times daily (Patient taking differently: Take 1 tablet by mouth 3 (three) times daily. )  . hydrALAZINE (APRESOLINE) 25 MG tablet Take 1 tablet (25 mg total) by mouth 3 (three) times daily. Increase to qid if needed for blood pressure >160/90 (either).  . irbesartan (AVAPRO) 300 MG tablet Take 1 tablet (300 mg total) by mouth daily.  Marland Kitchen omeprazole (PRILOSEC) 40 MG capsule Take 1 capsule (40 mg total) by mouth daily. (Patient taking differently: Take 80 mg by mouth daily. )  . tolterodine (DETROL LA) 4 MG 24 hr capsule Take 4 mg by mouth daily.      Review of Systems  Constitutional: Negative for fever and unexpected weight change.  HENT: Negative for congestion, dental problem, ear pain, nosebleeds, postnasal drip, rhinorrhea, sinus pressure, sneezing, sore throat and trouble swallowing.   Eyes: Negative for redness and itching.  Respiratory: Positive for shortness  of breath. Negative for cough, chest tightness and wheezing.   Cardiovascular: Positive for leg swelling. Negative for palpitations.  Gastrointestinal: Negative for nausea and vomiting.  Genitourinary: Negative for dysuria.  Musculoskeletal: Negative for joint swelling.  Skin: Negative for rash.  Neurological: Positive for headaches.  Hematological: Does not bruise/bleed easily.  Psychiatric/Behavioral: Negative for dysphoric mood. The patient is not nervous/anxious.        Objective:   Physical Exam  amb obese wf nad   Wt Readings from Last 3 Encounters:  08/05/17 255 lb 6.4 oz (115.8 kg)  08/04/17 255 lb 12.8 oz (116 kg)  07/02/17 247 lb (112 kg)    Vital  signs reviewed   - Note on arrival 02 sats  96% on  RA and bp 152/94 p am meds    HEENT: nl dentition, turbinates bilaterally, and oropharynx. Nl external ear canals without cough reflex   NECK :  without JVD/Nodes/TM/ nl carotid upstrokes bilaterally   LUNGS: no acc muscle use,  Nl contour chest which is clear to A and P bilaterally without cough on insp or exp maneuvers   CV:  RRR  no s3 or murmur or increase in P2, and trace bilateral ankle pitting  edema   ABD:  soft and nontender with nl inspiratory excursion in the supine position. No bruits or organomegaly appreciated, bowel sounds nl  MS:  Nl gait/ ext warm without deformities, calf tenderness, cyanosis or clubbing No obvious joint restrictions   SKIN: warm and dry without lesions    NEURO:  alert, approp, nl sensorium with  no motor or cerebellar deficits apparent.     CXR PA and Lateral:   08/05/2017 :    I personally reviewed images and  impression as follows:    cm with ? kerly B's and large amt of intestinal air including esophogeal    Labs ordered/ reviewed:      Chemistry      Component Value Date/Time   NA 143 08/04/2017 1505   K 4.0 08/04/2017 1505   CL 105 08/04/2017 1505   CO2 24 08/04/2017 1505   BUN 12 08/04/2017 1505   CREATININE 0.78 08/04/2017 1505   CREATININE 0.91 04/23/2016 0913      Component Value Date/Time   CALCIUM 9.1 08/04/2017 1505   ALKPHOS 114 08/05/2017 1705   AST 12 08/05/2017 1705   ALT 5 08/05/2017 1705   BILITOT 0.4 08/05/2017 1705   BILITOT <0.2 08/04/2017 1505        Lab Results  Component Value Date   WBC 8.1 04/23/2016   HGB 14.1 04/23/2016   HCT 40.6 04/23/2016   MCV 85.8 04/23/2016   PLT 227 04/23/2016      Lab Results  Component Value Date   TSH 0.803 08/04/2017     Lab Results  Component Value Date   PROBNP 123.0 (H) 08/05/2017                 Assessment & Plan:

## 2017-08-06 ENCOUNTER — Encounter: Payer: Self-pay | Admitting: Internal Medicine

## 2017-08-06 LAB — RESPIRATORY ALLERGY PROFILE REGION II ~~LOC~~
Allergen, A. alternata, m6: <0.1 kU/L
Allergen, Comm Silver Birch, t9: <0.1 kU/L
Allergen, Cottonwood, t14: <0.1 kU/L
Allergen, D pternoyssinus,d7: <0.1 kU/L
Allergen, Mouse Urine Protein, e78: <0.1 kU/L
Allergen, P. notatum, m1: <0.1 kU/L
Aspergillus fumigatus, m3: <0.1 kU/L
Bermuda Grass: <0.1 kU/L
Box Elder IgE: <0.1 kU/L
CLASS: 0
CLASS: 0
CLASS: 0
CLASS: 0
CLASS: 0
CLASS: 0
CLASS: 0
CLASS: 0
CLASS: 0
CLASS: 0
CLASS: 0
CLASS: 0
COMMON RAGWEED (SHORT) (W1) IGE: <0.1 kU/L
Class: 0
Class: 0
Class: 0
Class: 0
Class: 0
Class: 0
Class: 0
Class: 0
Class: 0
Class: 0
Class: 0
Class: 0
D. farinae: <0.1 kU/L
Elm IgE: <0.1 kU/L
IGE (IMMUNOGLOBULIN E), SERUM: 48 kU/L (ref <=114)
Johnson Grass: <0.1 kU/L
Pecan/Hickory Tree IgE: <0.1 kU/L
Sheep Sorrel IgE: <0.1 kU/L

## 2017-08-06 LAB — INTERPRETATION:

## 2017-08-06 NOTE — Assessment & Plan Note (Signed)
Not Adequate control on present rx and could risk diastolic dysfunction here , reviewed in detail with pt > no change in rx needed  For now but needs to avoid salt, take bp meds consistently and Follow up per Primary Care planned

## 2017-08-06 NOTE — Assessment & Plan Note (Signed)
Spirometry 08/05/2017  FEV1 2.71 (93%)  Ratio 86  s prior rx/slt blunted effort dep portion - 08/05/2017   Walked RA  2 laps @ 185 ft each stopped due to  Hips/ fatigue not sob or desat  - PFT's with MIP/ MEP rec 08/05/2017    Symptoms are markedly disproportionate to objective findings and not clear this is actually much of a  lung problem but pt does appear to have difficult to sort out respiratory symptoms of unknown origin for which  DDX  = almost all start with A and  include Adherence, Ace Inhibitors, Acid Reflux, Active Sinus Disease, Alpha 1 Antitripsin deficiency, Anxiety masquerading as Airways dz,  ABPA,  Allergy(esp in young), Aspiration (esp in elderly), Adverse effects of meds,  Active smokers, A bunch of PE's (a small clot burden can't cause this syndrome unless there is already severe underlying pulm or vascular dz with poor reserve) plus two Bs  = Bronchiectasis and Beta blocker use..and one C= CHF     Adherence is always the initial "prime suspect" and is a multilayered concern that requires a "trust but verify" approach in every patient - starting with knowing how to use medications, especially inhalers, correctly, keeping up with refills and understanding the fundamental difference between maintenance and prns vs those medications only taken for a very short course and then stopped and not refilled.   ? Acid (or non-acid) GERD > always difficult to exclude as up to 75% of pts in some series report no assoc GI/ Heartburn symptoms> rec max (24h)  acid suppression and diet restrictions/ reviewed and instructions given in writing.   ? Anxiety > usually at the bottom of this list of usual suspects but should be much higher on this pt's based on H and P esp with finger tingling assoc with resting sob > rx per neuro  ? Adverse drug reaction ? BB effects > concern with high dose tenormin but in the absence of airflow obst this seems less likely   ? CHF >  Consider echo next of in view of cxr  and intermediate but still on the very low side BNP levels   Will return p trial of gerd rx and pfts with MIP/MEP to complete the w/u    Total time devoted to counseling  > 50 % of initial 60 min office visit:  review case with pt/ discussion of options/alternatives/ personally creating written customized instructions  in presence of pt  then going over those specific  Instructions directly with the pt including how to use all of the meds but in particular covering each new medication in detail and the difference between the maintenance= "automatic" meds and the prns using an action plan format for the latter (If this problem/symptom => do that organization reading Left to right).  Please see AVS from this visit for a full list of these instructions which I personally wrote for this pt and  are unique to this visit.

## 2017-08-11 ENCOUNTER — Other Ambulatory Visit: Payer: Self-pay | Admitting: Neurology

## 2017-08-12 ENCOUNTER — Encounter (HOSPITAL_COMMUNITY): Payer: Medicare Other

## 2017-08-16 ENCOUNTER — Encounter: Payer: Self-pay | Admitting: Emergency Medicine

## 2017-08-16 ENCOUNTER — Ambulatory Visit (INDEPENDENT_AMBULATORY_CARE_PROVIDER_SITE_OTHER): Payer: Medicare Other | Admitting: Emergency Medicine

## 2017-08-16 VITALS — BP 160/88 | HR 75 | Temp 99.3°F | Resp 16 | Ht 68.0 in | Wt 249.8 lb

## 2017-08-16 DIAGNOSIS — R51 Headache: Secondary | ICD-10-CM | POA: Diagnosis not present

## 2017-08-16 DIAGNOSIS — R0989 Other specified symptoms and signs involving the circulatory and respiratory systems: Secondary | ICD-10-CM | POA: Diagnosis not present

## 2017-08-16 DIAGNOSIS — Z23 Encounter for immunization: Secondary | ICD-10-CM | POA: Diagnosis not present

## 2017-08-16 DIAGNOSIS — R519 Headache, unspecified: Secondary | ICD-10-CM

## 2017-08-16 DIAGNOSIS — I10 Essential (primary) hypertension: Secondary | ICD-10-CM

## 2017-08-16 MED ORDER — TRAMADOL HCL 50 MG PO TABS: 50.0000 mg | ORAL_TABLET | Freq: Three times a day (TID) | ORAL | 0 refills | Status: DC | PRN

## 2017-08-16 NOTE — Progress Notes (Signed)
Jacqueline Atkinson 61 y.o.   Chief Complaint  Patient presents with  . Follow-up    BLOOD PRESSURE    HISTORY OF PRESENT ILLNESS: This is a 61 y.o. female complaining of blood pressure issues and intermittent headaches no different than previous ones. BP'Atkinson at home have been in the range of 160-170'Atkinson/80-90'Atkinson a couple of times higher.  HPI   Prior to Admission medications   Medication Sig Start Date End Date Taking? Authorizing Provider  acetaminophen (TYLENOL) 500 MG tablet Take 1,000 mg by mouth every 4 (four) hours as needed for moderate pain.    Yes [provider]  atenolol (TENORMIN) 100 MG tablet Take 1 tablet (100 mg total) by mouth daily. 08/04/17  Yes Jacqueline Knapp, MD  carbidopa-levodopa (SINEMET CR) 50-200 MG tablet TAKE ONE TABLET BY MOUTH AT BEDTIME 08/12/17  Yes Atkinson, Jacqueline S, DO  carbidopa-levodopa (SINEMET IR) 25-100 MG tablet Take 1 tablet 6 times daily Patient taking differently: Take 1 tablet by mouth 3 (three) times daily.  06/14/16  Yes Atkinson, Jacqueline Quail, DO  hydrALAZINE (APRESOLINE) 25 MG tablet Take 1 tablet (25 mg total) by mouth 3 (three) times daily. Increase to qid if needed for blood pressure >160/90 (either). 08/04/17  Yes Jacqueline Knapp, MD  irbesartan (AVAPRO) 300 MG tablet Take 1 tablet (300 mg total) by mouth daily. 07/02/17  Yes Jacqueline Knapp, MD  omeprazole (PRILOSEC) 40 MG capsule Take 1 capsule (40 mg total) by mouth daily. Patient taking differently: Take 80 mg by mouth daily.  04/23/16  Yes Jacqueline Knapp, MD  tolterodine (DETROL LA) 4 MG 24 hr capsule Take 4 mg by mouth daily. 02/12/17  Yes [provider]    Allergies  Allergen Reactions  . Sulfa Antibiotics     Long time ago and does not remember reaction    Patient Active Problem List   Diagnosis Date Noted  . Dyspnea on exertion 08/05/2017  . Hyperlipidemia 04/23/2016  . Arthritis 04/23/2016  . 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 Postoperative anemia due to acute blood loss  08/29/2015  . 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 Neuromuscular disease ([place]) 09/26/2014  . Gait difficulty 12/29/2013  . Back pain 12/29/2013  . Paresthesia 12/19/2012  . Essential hypertension 11/28/2012  . Polypharmacy 11/28/2012  . GERD (gastroesophageal reflux disease) 11/28/2012    Past Medical History:  Diagnosis Date  . Allergy   . Anemia   . Anxiety   . Arthritis    Left knee  . Edema extremities    lower extremity edema left greater than right.  Marland Kitchen GERD (gastroesophageal reflux disease)   . Heart murmur   . History of hiatal hernia    small  . Hx of seasonal allergies    occ. use OTC Mucinex or Antihistamine.  . Hyperlipidemia   . Hypertension   . Mobility impaired    ambulates with walker, tendency to lose balance.  . 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 Neuromuscular disorder ([place])    "multi system atrophy disease"- "pain, bilateral foot numbness, left knee pain"- Dr. Carles Collet is following.  . 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 ESOPHAGOGASTRODUODENOSCOPY ENDOSCOPY     with esophageal dilation  . FOOT SURGERY  2011   3rd  metatarsal LT foot  . KNEE ARTHROSCOPY Left 02/08/2015   Procedure: LEFT KNEE ARTHROSCOPY ;  Surgeon: Latanya Maudlin, MD;  Location: [facility];  Service: Orthopedics;  Laterality: Left;  . TONSILLECTOMY AND ADENOIDECTOMY    . TOTAL KNEE ARTHROPLASTY Left 08/22/2015   Procedure: LEFT TOTAL  KNEE  ARTHROPLASTY;  Surgeon: Latanya Maudlin, MD;  Location: [facility];  Service: Orthopedics;  Laterality: Left;    Social History   Social History  . Marital status: Divorced    Spouse name: N/A  . Number of children: 7  . Years of education: 69   Occupational History  . ACTIVITY COORDINATOR [employer]   Social History Main  Topics  . Smoking status: Former Smoker    Packs/day: 1.00    Years: 12.00    Quit date: 03/14/1988  . Smokeless tobacco: Never Used  . Alcohol use No  . Drug use: No  . Sexual activity: Not on file   Other Topics Concern  . Not on file   Social History Narrative  . No narrative on file    Family History  Problem Relation Age of Onset  . Cancer Mother   . Diabetes Mother   . Kidney disease Mother   . COPD Father   . Brain cancer Brother      Review of Systems  Constitutional: Negative.  Negative for chills and fever.  HENT: Negative.  Negative for congestion, ear pain, nosebleeds and sore throat.   Eyes: Negative for blurred vision, double vision and pain.  Respiratory: Negative for cough and shortness of breath.   Cardiovascular: Positive for leg swelling. Negative for chest pain and palpitations.  Gastrointestinal: Negative for abdominal pain, blood in stool, diarrhea, nausea and vomiting.  Genitourinary: Negative for dysuria and hematuria.  Skin: Negative for rash.  Neurological: Positive for headaches. Negative for dizziness, sensory change, focal weakness, seizures and loss of consciousness.  Endo/Heme/Allergies: Negative.   All other systems reviewed and are negative.    Vitals:   08/16/17 0856  BP: (!) 160/88  Pulse: 75  Resp: 16  Temp: 99.3 F (37.4 C)  SpO2: 96%     Physical Exam  Constitutional: She is oriented to person, place, and time. She appears well-developed.  Obese.  HENT:  Head: Normocephalic and atraumatic.  Eyes: Pupils are equal, round, and reactive to light.  Neck: Normal range of motion. Neck supple.  Cardiovascular: Normal rate, regular rhythm and normal heart sounds.   Pulmonary/Chest: Effort normal and breath sounds normal.  Musculoskeletal: She exhibits edema.  Neurological: She is alert and oriented to person, place, and time. No cranial nerve deficit or sensory deficit. She exhibits normal muscle tone.  Skin: Skin is warm and  dry. Capillary refill takes less than 2 seconds.  Psychiatric: She has a normal mood and affect. Her behavior is normal.     ASSESSMENT & PLAN: Jacqueline Atkinson was seen today for follow-up.  Diagnoses and all orders for this visit:  Labile blood pressure  Malignant hypertension  Intractable episodic headache, unspecified headache type Comments: maybe related to hydralazine  Need for prophylactic vaccination and inoculation against influenza -     Flu Vaccine QUAD 36+ mos IM  Other orders -     traMADol (ULTRAM) 50 MG tablet; Take 1 tablet (50 mg total) by mouth every 8 (eight) hours as needed.    Patient Instructions       IF you received an x-ray today, you will receive an invoice from [facility]. Please contact [facility] at [phone] with questions or concerns regarding your invoice.   IF you received labwork today, you will receive an invoice from [facility]. Please contact [facility] at [phone] with questions or concerns regarding your invoice.   Our billing staff will not be able to assist you with questions regarding bills from these companies.  You will be contacted with the lab results as soon as they are available. The fastest way to get your results is to activate your My Chart account. Instructions are located on the last page of this paperwork. If you have not heard from [place] regarding the results in 2 weeks, please contact this office.     Hypertension Hypertension is another name for high blood pressure. High blood pressure forces your heart to work harder to pump blood. This can cause problems over time. There are two numbers in a blood pressure reading. There is a top number (systolic) over a bottom number (diastolic). It is best to have a blood pressure below 120/80. Healthy choices can help lower your blood pressure. You may need medicine to help lower your blood pressure if:  Your blood pressure cannot be lowered with healthy  choices.  Your blood pressure is higher than 130/80.  Follow these instructions at home: Eating and drinking  If directed, follow the DASH eating plan. This diet includes: ? Filling half of your plate at each meal with fruits and vegetables. ? Filling one quarter of your plate at each meal with whole grains. Whole grains include whole wheat pasta, brown rice, and whole grain bread. ? Eating or drinking low-fat dairy products, such as skim milk or low-fat yogurt. ? Filling one quarter of your plate at each meal with low-fat (lean) proteins. Low-fat proteins include fish, skinless chicken, eggs, beans, and tofu. ? Avoiding fatty meat, cured and processed meat, or chicken with skin. ? Avoiding premade or processed food.  Eat less than 1,500 mg of salt (sodium) a day.  Limit alcohol use to no more than 1 drink a day for nonpregnant women and 2 drinks a day for men. One drink equals 12 oz of beer, 5 oz of wine, or 1 oz of hard liquor. Lifestyle  Work with your doctor to stay at a healthy weight or to lose weight. Ask your doctor what the best weight is for you.  Get at least 30 minutes of exercise that causes your heart to beat faster (aerobic exercise) most days of the week. This may include walking, swimming, or biking.  Get at least 30 minutes of exercise that strengthens your muscles (resistance exercise) at least 3 days a week. This may include lifting weights or pilates.  Do not use any products that contain nicotine or tobacco. This includes cigarettes and e-cigarettes. If you need help quitting, ask your doctor.  Check your blood pressure at home as told by your doctor.  Keep all follow-up visits as told by your doctor. This is important. Medicines  Take over-the-counter and prescription medicines only as told by your doctor. Follow directions carefully.  Do not skip doses of blood pressure medicine. The medicine does not work as well if you skip doses. Skipping doses also puts  you at risk for problems.  Ask your doctor about side effects or reactions to medicines that you should watch for. Contact a doctor if:  You think you are having a reaction to the medicine you are taking.  You have headaches that keep coming back (recurring).  You  feel dizzy.  You have swelling in your ankles.  You have trouble with your vision. Get help right away if:  You get a very bad headache.  You start to feel confused.  You feel weak or numb.  You feel faint.  You get very bad pain in your: ? Chest. ? Belly (abdomen).  You throw up (vomit) more than once.  You have trouble breathing. Summary  Hypertension is another name for high blood pressure.  Making healthy choices can help lower blood pressure. If your blood pressure cannot be controlled with healthy choices, you may need to take medicine. This information is not intended to replace advice given to you by your health care provider. Make sure you discuss any questions you have with your health care provider. Document Released: 04/01/2008 Document Revised: 09/11/2016 Document Reviewed: 09/11/2016 Elsevier Interactive Patient Education  2018 Elsevier Inc.      Agustina Caroli, MD [facility]

## 2017-08-16 NOTE — Patient Instructions (Addendum)
   IF you received an x-ray today, you will receive an invoice from [facility]. Please contact [facility] at [phone] with questions or concerns regarding your invoice.   IF you received labwork today, you will receive an invoice from [facility]. Please contact [facility] at [phone] with questions or concerns regarding your invoice.   Our billing staff will not be able to assist you with questions regarding bills from these companies.  You will be contacted with the lab results as soon as they are available. The fastest way to get your results is to activate your My Chart account. Instructions are located on the last page of this paperwork. If you have not heard from us regarding the results in 2 weeks, please contact this office.     Hypertension Hypertension is another name for high blood pressure. High blood pressure forces your heart to work harder to pump blood. This can cause problems over time. There are two numbers in a blood pressure reading. There is a top number (systolic) over a bottom number (diastolic). It is best to have a blood pressure below 120/80. Healthy choices can help lower your blood pressure. You may need medicine to help lower your blood pressure if:  Your blood pressure cannot be lowered with healthy choices.  Your blood pressure is higher than 130/80.  Follow these instructions at home: Eating and drinking  If directed, follow the DASH eating plan. This diet includes: ? Filling half of your plate at each meal with fruits and vegetables. ? Filling one quarter of your plate at each meal with whole grains. Whole grains include whole wheat pasta, brown rice, and whole grain bread. ? Eating or drinking low-fat dairy products, such as skim milk or low-fat yogurt. ? Filling one quarter of your plate at each meal with low-fat (lean) proteins. Low-fat proteins include fish, skinless chicken, eggs, beans, and tofu. ? Avoiding fatty meat, cured  and processed meat, or chicken with skin. ? Avoiding premade or processed food.  Eat less than 1,500 mg of salt (sodium) a day.  Limit alcohol use to no more than 1 drink a day for nonpregnant women and 2 drinks a day for men. One drink equals 12 oz of beer, 5 oz of wine, or 1 oz of hard liquor. Lifestyle  Work with your doctor to stay at a healthy weight or to lose weight. Ask your doctor what the best weight is for you.  Get at least 30 minutes of exercise that causes your heart to beat faster (aerobic exercise) most days of the week. This may include walking, swimming, or biking.  Get at least 30 minutes of exercise that strengthens your muscles (resistance exercise) at least 3 days a week. This may include lifting weights or pilates.  Do not use any products that contain nicotine or tobacco. This includes cigarettes and e-cigarettes. If you need help quitting, ask your doctor.  Check your blood pressure at home as told by your doctor.  Keep all follow-up visits as told by your doctor. This is important. Medicines  Take over-the-counter and prescription medicines only as told by your doctor. Follow directions carefully.  Do not skip doses of blood pressure medicine. The medicine does not work as well if you skip doses. Skipping doses also puts you at risk for problems.  Ask your doctor about side effects or reactions to medicines that you should watch for. Contact a doctor if:  You think you are having a reaction to the   medicine you are taking.  You have headaches that keep coming back (recurring).  You feel dizzy.  You have swelling in your ankles.  You have trouble with your vision. Get help right away if:  You get a very bad headache.  You start to feel confused.  You feel weak or numb.  You feel faint.  You get very bad pain in your: ? Chest. ? Belly (abdomen).  You throw up (vomit) more than once.  You have trouble breathing. Summary  Hypertension is  another name for high blood pressure.  Making healthy choices can help lower blood pressure. If your blood pressure cannot be controlled with healthy choices, you may need to take medicine. This information is not intended to replace advice given to you by your health care provider. Make sure you discuss any questions you have with your health care provider. Document Released: 04/01/2008 Document Revised: 09/11/2016 Document Reviewed: 09/11/2016 Elsevier Interactive Patient Education  2018 [employer] Inc.  

## 2017-08-18 ENCOUNTER — Ambulatory Visit: Payer: Medicare Other | Admitting: Occupational Therapy

## 2017-08-18 ENCOUNTER — Ambulatory Visit: Payer: Medicare Other | Admitting: Physical Therapy

## 2017-08-18 VITALS — BP 175/95

## 2017-08-18 DIAGNOSIS — R2689 Other abnormalities of gait and mobility: Secondary | ICD-10-CM | POA: Diagnosis not present

## 2017-08-18 DIAGNOSIS — R278 Other lack of coordination: Secondary | ICD-10-CM | POA: Diagnosis not present

## 2017-08-18 DIAGNOSIS — R2681 Unsteadiness on feet: Secondary | ICD-10-CM

## 2017-08-18 DIAGNOSIS — R29898 Other symptoms and signs involving the musculoskeletal system: Secondary | ICD-10-CM | POA: Diagnosis not present

## 2017-08-18 DIAGNOSIS — R29818 Other symptoms and signs involving the nervous system: Secondary | ICD-10-CM

## 2017-08-18 DIAGNOSIS — R260 Ataxic gait: Secondary | ICD-10-CM

## 2017-08-18 DIAGNOSIS — R293 Abnormal posture: Secondary | ICD-10-CM | POA: Diagnosis not present

## 2017-08-18 NOTE — Therapy (Signed)
[facility] [address] [place], [name], [address] Phone: [phone]   Fax:  [phone]  Occupational Therapy Evaluation  Patient Details  Name: Jacqueline Atkinson MRN: [id number] Date of Birth: 1956/03/12 Referring Provider: Dr. Wells Guiles Tat   Encounter Date: 08/18/2017      OT End of Session - 08/18/17 1001    Visit Number 1   Number of Visits 17   Date for OT Re-Evaluation 10/17/17   Authorization Type BC[employer] Medicare, no visit limit/no auth; G-code needed   Authorization - Visit Number 1   Authorization - Number of Visits 10   OT Start Time 0850   OT Stop Time 0934   OT Time Calculation (min) 44 min   Activity Tolerance Patient tolerated treatment well   Behavior During Therapy [facility] for tasks assessed/performed      Past Medical History:  Diagnosis Date  . Allergy   . Anemia   . Anxiety   . Arthritis    Left knee  . Edema extremities    lower extremity edema left greater than right.  Marland Kitchen GERD (gastroesophageal reflux disease)   . Heart murmur   . History of hiatal hernia    small  . Hx of seasonal allergies    occ. use OTC Mucinex or Antihistamine.  . Hyperlipidemia   . Hypertension   . Mobility impaired    ambulates with walker, tendency to lose balance.  . 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 Neuromuscular disorder ([place])    "multi system atrophy disease"- "pain, bilateral foot numbness, left knee pain"- Dr. Carles Collet is following.  . 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 ESOPHAGOGASTRODUODENOSCOPY ENDOSCOPY     with esophageal dilation  . FOOT SURGERY  2011   3rd metatarsal LT foot  . KNEE ARTHROSCOPY Left 02/08/2015   Procedure: LEFT KNEE ARTHROSCOPY ;  Surgeon: Latanya Maudlin, MD;  Location: [facility];   Service: Orthopedics;  Laterality: Left;  . TONSILLECTOMY AND ADENOIDECTOMY    . TOTAL KNEE ARTHROPLASTY Left 08/22/2015   Procedure: LEFT TOTAL  KNEE  ARTHROPLASTY;  Surgeon: Latanya Maudlin, MD;  Location: [facility];  Service: Orthopedics;  Laterality: Left;    Vitals:   08/18/17 0858  BP: (!) 175/95        Subjective Assessment - 08/18/17 0858    Subjective  Pt reports that she went to MD Sat. and MD thinks that BP may be up due to migraines   Pertinent History Multiple Systems Atrophy (MSA); L knee replacement 2016; HTN; migraines; dyspnea on exertion; hyperlipidemia   Limitations **check BP; fall risk   Patient Stated Goals improve use of R hand for painting, writing   Currently in Pain? Yes   Pain Score 3    Pain Location Head   Pain Descriptors / Indicators Headache   Pain Type Acute pain   Pain Onset More than a month ago   Pain Frequency Intermittent   Aggravating Factors  too much noise, stress   Pain Relieving Factors Excedrine migraine, Caffine   Effect of Pain on Daily Activities OT will monitor headache as relates to treatment, but will not directly address due to nature/location           [facility] OT Assessment - 08/18/17 0001  Assessment   Diagnosis Multiple Systems Atrophy (MSA)    Referring Provider Dr. Wells Guiles Tat    Onset Date --  decline over last year   Prior Therapy last screen in 2016, last OT prior to 2016  PT earlier this year at home for L knee     Precautions   Precautions Fall   Precaution Comments check BP     Balance Screen   Has the patient fallen in the past 6 months No     Home  Environment   Family/patient expects to be discharged to: Private residence   Lives With Family  2 dtrs and son-in-law     Prior Function   Vocation On disability   Leisure enjoys art/writing, can't use brushes to paint (finger paints with gloves); cannot use stationary bike now due to L knee popping/pain; enjoys exercise in water with difficulty  donning/doffing bathing suit     ADL   Eating/Feeding Needs assist with cutting food  difficulty cutting/needs assist, difficulty turning utensil;   Eating/Feeding Patient Percentage --  switches to LUE without even realizing it   Grooming --  can't do make-up (blending), eye make-up   Grooming Patient Percentage --  electric toothbrush   Upper Body Bathing --  incr time   Lower Body Bathing Increased time   Upper Body Dressing Increased time  pull-over, front closure bra   Lower Body Dressing --  difficulty, particularly with RLE   Toilet Transfer Modified independent  BSC on top   Toileting - Clothing Manipulation Increased time   Tub/Shower Transfer Modified independent   Data processing manager tub bench;Grab bars  hand held shower head     IADL   Prior Level of Function Shopping uses scooter   Shopping --  dependent on how she feels   Prior Level of Function Light Housekeeping loads dishwasher, wipes counters, inside toilet   Light Housekeeping --  performs tasks at waist>chest level   Prior Level of Function Meal Prep does light tasks, assist for chopping/slicing and carrying heavier pots/pans   Programmer, applications own vehicle  with difficulty due to rigidity R side   Medication Management --  difficulty picking up pills     Mobility   Mobility Status History of falls   Mobility Status Comments uses RW in community, but uses 4-wheeled walker at home to assist in carrying objects  has lift chair     Written Expression   Dominant Hand Right   Handwriting Increased time  "laborious"     Vision Assessment   Vision Assessment Vision not tested     Activity Tolerance   Activity Tolerance Comments pt reports that she fatigues quickly when using R hand and often switches to LUE without realizing it     Cognition   Overall Cognitive Status Within Functional Limits for tasks assessed  for eval; not formally assessed     Observation/Other  Assessments   Observations forward lean when standing/walking   Other Surveys  Select   Physical Performance Test   Yes   Donning Doffing Jacket Time (seconds) 13.09    Donning Doffing Jacket Comments more difficulty doffing     Coordination   9 Hole Peg Test Right;Left   Right 9 Hole Peg Test 37.71   Left 9 Hole Peg Test 31.34   Box and Blocks R-42blocks, L-55blocks     Tone   Assessment Location Right Upper Extremity;Left Upper Extremity     ROM / Strength  AROM / PROM / Strength AROM     AROM   Overall AROM  Deficits   Overall AROM Comments decr R supination approx 75%     RUE Tone   RUE Tone --  mild-mod rigidity     LUE Tone   LUE Tone Within Functional Limits                           OT Short Term Goals - 08/18/17 1011      OT SHORT TERM GOAL #1   Title Pt will be independent with updated HEP.--check STGs 09/17/17   Time 4   Period Weeks   Status New     OT SHORT TERM GOAL #2   Title Pt will improve RUE coordination/functional reaching as shown by improving score on box and blocks test by at least 4 blocks.   Baseline R-42   Time 4   Period Weeks   Status New     OT SHORT TERM GOAL #3   Title Pt will report incr ease with LB dressing using strategies/AE prn.   Time 4   Period Weeks   Status New     OT SHORT TERM GOAL #4   Title Pt will verbalize understanding of appropriate community resources and ways to prevent future complications.   Time 4   Period Weeks   Status New     OT SHORT TERM GOAL #5   Title Further assess writing and add goal as appropriate.   Time 4   Period Weeks   Status New           OT Long Term Goals - 08/18/17 1257      OT LONG TERM GOAL #1   Title Pt will verbalize understanding of adaptive strategies/AE for ADLs/IADLs (including strategies for putting on make-up, donning/doffing bathing suit and leggings, eating, writing, painting).--check LTGs 10/17/17   Time 8   Period Weeks   Status New      OT LONG TERM GOAL #2   Title Pt will improve RUE coordination/functional reaching as shown by improving score on box and blocks test by at least 8 blocks.   Baseline R-42 blocks   Time 8   Period Weeks   Status New     OT LONG TERM GOAL #3   Title Pt will improve coordination for ADLs as shown by improving time on 9-hole peg test by at least 5sec with RUE.   Baseline R-37.71sec, L-31.34sec   Time 8   Period Weeks   Status New     OT LONG TERM GOAL #4   Title Pt will report incr ease with writing/painting.   Time 8   Period Weeks   Status New     OT LONG TERM GOAL #5   Title Pt will report using RUE as dominant UE for eating at least 90% of the time.   Time 8   Period Weeks   Status New     Long Term Additional Goals   Additional Long Term Goals --               Plan - 08/18/17 1002    Clinical Impression Statement Pt presents with bradykinesia, rigidity, decr posture, decr balance/functional mobility for ADLs/IADLs, decr coordination, decr activity tolerance.  Pt would benefit from occupational therapy to improve ADL/IADL performance, improve dominant RUE functional use, prevent future complications, and improve quality of life.   Occupational Profile and client  history currently impacting functional performance Pt is a 61 y.o. female with Multiple Systems Atrophy who reports decline in function over the last year.  Pt with PMH that includes:  L knee replacement 2016; HTN; migraines; dyspnea on exertion; hyperlipidemia.  Pt reports decr dominant RUE functional use for ADLs and leisure activities affecting quality of life.     Occupational performance deficits (Please refer to evaluation for details): ADL's;IADL's;Leisure;Social Participation;Rest and Sleep   Rehab Potential Good   OT Frequency 2x / week   OT Duration 8 weeks  +eval   OT Treatment/Interventions Self-care/ADL training;Moist Heat;DME and/or AE instruction;Splinting;Patient/family  education;Fluidtherapy;Therapeutic exercises;Therapeutic activities;Balance training;Therapeutic exercise;Neuromuscular education;Functional Mobility Training;Passive range of motion;Manual Therapy;Energy conservation;Parrafin;Cryotherapy;Aquatic Therapy   Plan Assess writing further and add goal prn: initiate supine PWR!, PWR! hands HEP   Clinical Decision Making Several treatment options, min-mod task modification necessary   Consulted and Agree with Plan of Care Patient      Patient will benefit from skilled therapeutic intervention in order to improve the following deficits and impairments:  Decreased knowledge of use of DME, Decreased mobility, Decreased strength, Impaired UE functional use, Decreased balance, Decreased activity tolerance, Impaired tone, Improper spinal/pelvic alignment, Decreased endurance, Decreased coordination, Decreased range of motion (bradykinesia)  Visit Diagnosis: Other symptoms and signs involving the musculoskeletal system  Other symptoms and signs involving the nervous system  Other lack of coordination  Unsteadiness on feet  Ataxic gait  Abnormal posture      G-Codes - 2017-09-01 1312    Functional Assessment Tool Used (Outpatient only) Box and blocks test:  R-42, L-55 blocks.  9-hole peg test:  R-37.71, L-31.34sec   Functional Limitation Carrying, moving and handling objects   Carrying, Moving and Handling Objects Current Status [phone]) At least 40 percent but less than 60 percent impaired, limited or restricted   Carrying, Moving and Handling Objects Goal Status ([id number]) At least 20 percent but less than 40 percent impaired, limited or restricted      Problem List Patient Active Problem List   Diagnosis Date Noted  . Dyspnea on exertion 08/05/2017  . Hyperlipidemia 04/23/2016  . Arthritis 04/23/2016  . 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 Postoperative anemia due to acute blood loss 08/29/2015  . History of total knee arthroplasty  08/22/2015  . Neuromuscular disease ([place]) 09/26/2014  . Gait difficulty 12/29/2013  . Back pain 12/29/2013  . Paresthesia 12/19/2012  . Essential hypertension 11/28/2012  . Polypharmacy 11/28/2012  . GERD (gastroesophageal reflux disease) 11/28/2012    [facility] 2017/09/01, 1:14 PM  [facility] [address] [place], [name], [address] Phone: [phone]   Fax:  [phone]  Name: Jacqueline Atkinson MRN: [id number] Date of Birth: 07/24/1956   Vianne Bulls, OTR/L [facility] [address] [place], [place]  [address] [phone] phone [phone] 01-Sep-2017 1:14 PM

## 2017-08-18 NOTE — Therapy (Signed)
[facility] [address] [place], [name], [address] Phone: [phone]   Fax:  [phone]  Physical Therapy Evaluation  Patient Details  Name: Jacqueline Atkinson MRN: [id number] Date of Birth: 1956/03/07 Referring Provider: Wells Guiles Tat  Encounter Date: 08/18/2017      PT End of Session - 08/18/17 1114    Visit Number 1   Number of Visits 17   Date for PT Re-Evaluation 10/17/17   Authorization Type [employer] Medicare   PT Start Time [phone]   PT Stop Time 1015   PT Time Calculation (min) 39 min   Activity Tolerance Patient limited by fatigue   Behavior During Therapy [facility] for tasks assessed/performed      Past Medical History:  Diagnosis Date  . Allergy   . Anemia   . Anxiety   . Arthritis    Left knee  . Edema extremities    lower extremity edema left greater than right.  Marland Kitchen GERD (gastroesophageal reflux disease)   . Heart murmur   . History of hiatal hernia    small  . Hx of seasonal allergies    occ. use OTC Mucinex or Antihistamine.  . Hyperlipidemia   . Hypertension   . Mobility impaired    ambulates with walker, tendency to lose balance.  . 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 Neuromuscular disorder ([place])    "multi system atrophy disease"- "pain, bilateral foot numbness, left knee pain"- Dr. Carles Collet is following.  . 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 ESOPHAGOGASTRODUODENOSCOPY ENDOSCOPY     with esophageal dilation  . FOOT SURGERY  2011   3rd metatarsal LT foot  . KNEE ARTHROSCOPY Left 02/08/2015   Procedure: LEFT KNEE ARTHROSCOPY ;  Surgeon: Latanya Maudlin, MD;  Location: [facility];  Service: Orthopedics;  Laterality: Left;  . TONSILLECTOMY AND ADENOIDECTOMY    . TOTAL KNEE ARTHROPLASTY Left 08/22/2015    Procedure: LEFT TOTAL  KNEE  ARTHROPLASTY;  Surgeon: Latanya Maudlin, MD;  Location: [facility];  Service: Orthopedics;  Laterality: Left;    There were no vitals filed for this visit.       Subjective Assessment - 08/18/17 0938    Subjective I have had a headache for 6 weeks; I almost went to ED Thursday night due to bad headache and elevated BP.  I have changed to the rolling walker, per Dr. Doristine Devoid recommendation, and that walker is more frustrating.  No falls in past 6 months   Pertinent History chronic back pain, L knee replacement 08/23/15 (median ligament crosses over the patella with knee extension-limits WB flexion>ext with standing or riding bike), reflux.   How long can you stand comfortably? Cannot stand >10-15 minutes before too much hip pain   Patient Stated Goals Want to be able to stand longer, work on safety with walker, getting in and out of van   Currently in Pain? Yes   Pain Score 4    Pain Location Head   Pain Orientation Left   Pain Descriptors / Indicators Headache   Pain Type Acute pain   Pain Frequency Constant  intensity comes and goes   Aggravating Factors  too much noise, stress   Pain Relieving Factors Medication, Caffeine  [facility] PT Assessment - 08/18/17 0950      Assessment   Medical Diagnosis multiple systems atrophy   Referring Provider Wells Guiles Tat   Onset Date/Surgical Date --  declining mobility over the past 3 years   Prior Therapy HHPT in January 2018      Precautions   Precautions Fall   Precaution Comments check BP     Balance Screen   Has the patient fallen in the past 6 months No   Has the patient had a decrease in activity level because of a fear of falling?  No   Is the patient reluctant to leave their home because of a fear of falling?  No     Home Social worker Private residence   Living Arrangements Children   Available Help at Discharge Family   Type of [place] to enter    Entrance Stairs-Number of Steps [place] One level   [place] - 2 wheels;Walker - 4 wheels  Uses rollator more in home     Prior Function   Vocation On disability   Leisure enjoys art/writing, can't use brushes to paint (finger paints with gloves); cannot use stationary bike now due to L knee popping/pain; enjoys exercise in water with difficulty donning/doffing bathing suit     Observation/Other Assessments   Focus on Therapeutic Outcomes (FOTO)  NA     Posture/Postural Control   Posture/Postural Control Postural limitations   Postural Limitations Forward head;Rounded Shoulders;Flexed trunk     Transfers   Transfers Sit to Stand;Stand to Sit   Sit to Stand 6: Modified independent (Device/Increase time);With upper extremity assist  Extra time decreased WB on LLE   Stand to Sit 6: Modified independent (Device/Increase time);With upper extremity assist;With armrests;To chair/3-in-1   Transfer Cueing Decreased functional lower extremity strength with need for UE support for transfers     Ambulation/Gait   Ambulation/Gait Yes   Ambulation/Gait Assistance 6: Modified independent (Device/Increase time);5: Supervision   Ambulation Distance (Feet) 400 Feet   Assistive device Rolling walker   Gait Pattern Step-through pattern;Decreased step length - left;Decreased step length - right;Trunk flexed;Poor foot clearance - right  pushes RW too far in front; externally rotated bilateral leg   Ambulation Surface Level;Indoor   Gait velocity 18.57 sec = 1.77 ft/sec   Gait Comments 3 minute walk test:  347 ft, with pt fatiguing toward end of test, with pt having increased R foot drag, decreased R foot clearance and decreased R step length     Standardized Balance Assessment   Standardized Balance Assessment Timed Up and Go Test     Timed Up and Go Test   TUG Normal TUG;Cognitive TUG   Normal TUG (seconds) 23.34   Manual TUG (seconds) 22.78    TUG Comments Scores >13.5-15 seconds indicated increased fall risk; pt does take 3.5-5 seconds to stand due to decreased ability to bear weight during transfer through LLE.            Objective measurements completed on examination: See above findings.                    PT Short Term Goals - 08/18/17 1123      PT SHORT TERM GOAL #1   Title Pt will be independent with HEP for improved transfers, gait and balance.  TARGET 09/19/17   Time 4   Period [place]  Status New   Target Date 09/19/17     PT SHORT TERM GOAL #2   Title Pt will improve TUG score to less than or equal to 18 seconds for decreased fall risk.   Time 4   Period Weeks   Status New   Target Date 09/19/17     PT SHORT TERM GOAL #3   Title Pt will improve gait velocity to at least 1.8 ft/sec for improved gait efficiency and safety.   Time 4   Period Weeks   Status New   Target Date 09/19/17     PT SHORT TERM GOAL #4   Title Pt will verbalize understanding of fall prevention in the home environment.   Time 4   Period Weeks   Status New   Target Date 09/19/17           PT Long Term Goals - 08/18/17 1126      PT LONG TERM GOAL #1   Title Pt will be independent with progression of HEP to address posture, balance, transfers and gait.  TARGET 10/17/17   Time 8   Period Weeks   Status New   Target Date 10/17/17     PT LONG TERM GOAL #2   Title Pt will improve TUG cognitive score to less than or equal to 18 seconds for decreased fall risk.   Time 8   Period Weeks   Status New   Target Date 10/17/17     PT LONG TERM GOAL #3   Title Pt will improve gait velocity to at least 2 ft/sec for improved gait efficiency and safety.   Time 8   Period Weeks   Status New   Target Date 10/17/17     PT LONG TERM GOAL #4   Title Pt will improve 3 minute walk test by 50 ft for improved efficiency and safety with gait.   Baseline 347 ft in 3 minutes at eval   Time 8   Period Weeks   Status New    Target Date 10/17/17     PT LONG TERM GOAL #5   Title Pt will verbalize plans for ongoing community fitness upon D/C from PT.   Time 8   Period Weeks   Status New   Target Date 10/17/17                Plan - 08/18/17 1115    Clinical Impression Statement Able to proceed/complete full eval today (BP 172/92).  Pt is a 61 year old female who presents to OP PT with history of MSA (multiple systems atrophy), with L TKR 2016.  Pt presents with decreased functional strength, decreased balance, slowed mobility, bradykinesia, decreased timing and coordination of gait, abnormal posture.  Pt has had no falls in the past 6 months.  Pt desires to continue to be able to cook for family, get out of home for activities in the community, and pt would benefit from skilled PT to address the above stated deficits and decrease fall risk to help improve participation in household and community activities.   History and Personal Factors relevant to plan of care: >3 co-morbidities per PMH, limitations in LLE mobility following L TKR   Clinical Presentation Evolving   Clinical Presentation due to: history of MSA, fall risk per gait velocity and TUG scores   Clinical Decision Making Moderate   Rehab Potential Good   Clinical Impairments Affecting Rehab Potential Limitations in L knee (per pt report:  median ligament  crosses patella when going flexion>extension and is very painful; pt has adapted, but is limited in weightbearing flex>ext movement patterns)   PT Frequency 2x / week   PT Duration 8 weeks   PT Treatment/Interventions ADLs/Self Care Home Management;Gait training;DME Instruction;Functional mobility training;Therapeutic activities;Therapeutic exercise;Balance training;Neuromuscular re-education;Patient/family education   PT Next Visit Plan Initiate HEP to address posture, balance, weightshifting-may try PWR! Moves in sitting/modified quadruped at counter if able; gait training-walking program, RLE  foot clearance and step length   Consulted and Agree with Plan of Care Patient      Patient will benefit from skilled therapeutic intervention in order to improve the following deficits and impairments:  Abnormal gait, Decreased balance, Decreased activity tolerance, Decreased mobility, Decreased strength, Difficulty walking, Postural dysfunction  Visit Diagnosis: Other abnormalities of gait and mobility  Unsteadiness on feet  Other symptoms and signs involving the nervous system  Other symptoms and signs involving the musculoskeletal system  Abnormal posture      G-Codes - 09/03/17 1129    Functional Assessment Tool Used (Outpatient Only) TUG 23.34 sec, TUG cog 22.78 sec; gait velocity 1.8 ft/sec; 347 ft in 3 minute walk test   Functional Limitation Mobility: Walking and moving around   Mobility: Walking and Moving Around Current Status [phone]) At least 60 percent but less than 80 percent impaired, limited or restricted   Mobility: Walking and Moving Around Goal Status [phone]) At least 40 percent but less than 60 percent impaired, limited or restricted       Problem List Patient Active Problem List   Diagnosis Date Noted  . Dyspnea on exertion 08/05/2017  . Hyperlipidemia 04/23/2016  . Arthritis 04/23/2016  . 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 Postoperative anemia due to acute blood loss 08/29/2015  . 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 Neuromuscular disease ([place]) 09/26/2014  . Gait difficulty 12/29/2013  . Back pain 12/29/2013  . Paresthesia 12/19/2012  . Essential hypertension 11/28/2012  . Polypharmacy 11/28/2012  . GERD (gastroesophageal reflux disease) 11/28/2012    Arcadia Gorgas W. September 03, 2017, 11:31 AM  Frazier Butt., PT   [facility] [address] [place], [name], [address] Phone: [phone]   Fax:  [phone]  Name: Jacqueline Atkinson MRN: [id number] Date of Birth:  [birth year]

## 2017-08-21 ENCOUNTER — Telehealth: Payer: Self-pay | Admitting: Family Medicine

## 2017-08-21 MED ORDER — CLONIDINE HCL 0.1 MG PO TABS: 0.1000 mg | ORAL_TABLET | Freq: Two times a day (BID) | ORAL | 0 refills | Status: DC

## 2017-08-21 NOTE — Telephone Encounter (Signed)
Please call pt w/ message below.  On review of her chart, it appears patient has not had any significant improvement in blood pressure since we started the hydralazine. She has an appointment scheduled with me another 10 days on November 5. I have sent a prescription for clonidine in to patient's pharmacy. If she would like, she could go ahead and start this bp med that we discussed at her last visit twice daily so that we can see if she can tolerate it and its effect at her next visit with me. For now continue the hydralazine though at some point we may try to back off on it and see if blood pressures worsen since they don't seem to have appreciably improved since starting it.

## 2017-08-22 ENCOUNTER — Ambulatory Visit (INDEPENDENT_AMBULATORY_CARE_PROVIDER_SITE_OTHER): Payer: Medicare Other | Admitting: Physician Assistant

## 2017-08-22 ENCOUNTER — Encounter: Payer: Self-pay | Admitting: Physician Assistant

## 2017-08-22 VITALS — BP 158/90 | HR 88 | Temp 98.3°F | Resp 16 | Ht 68.0 in | Wt 253.0 lb

## 2017-08-22 DIAGNOSIS — I10 Essential (primary) hypertension: Secondary | ICD-10-CM | POA: Diagnosis not present

## 2017-08-22 DIAGNOSIS — R030 Elevated blood-pressure reading, without diagnosis of hypertension: Secondary | ICD-10-CM | POA: Diagnosis not present

## 2017-08-22 NOTE — Progress Notes (Signed)
Jacqueline Atkinson  MRN: [id number] DOB: 08-13-1956  PCP: Shawnee Knapp, MD  Subjective:  Pt is a 61 year old female PMH malignant HTN who presents to clinic for follow-up blood pressure. Today's blood pressure is 158/90 - she brought her monitor with her today. Her monitor says 172/102. She has been getting readings around 170/90-100 at home.   HTN - Atenolol 100mg , irbesartan 300mg , hydralazine. She is taking hydralazine >3 times daily - she has not seen improvement on this medication.  She has not yet picked up Clonidine, was unaware this was at her pharmacy.   She has been using home blood pressure monitor which have been "through the roof". Endorses headaches. Occasionally HA wakes her at night. Denies snoring at night. Denies chest pain, vision changes, palpitations.   She has appt with Dr. Brigitte Pulse Nov 5.   Review of Systems  HENT: Negative for postnasal drip, rhinorrhea, sinus pressure and sneezing.   Respiratory: Negative for cough, chest tightness, shortness of breath and wheezing.   Cardiovascular: Negative for chest pain and palpitations.  Gastrointestinal: Negative for abdominal pain, diarrhea, nausea and vomiting.  Neurological: Positive for headaches. Negative for weakness and light-headedness.  Psychiatric/Behavioral: Positive for sleep disturbance. The patient is not nervous/anxious.     Patient Active Problem List   Diagnosis Date Noted  . Dyspnea on exertion 08/05/2017  . Hyperlipidemia 04/23/2016  . Arthritis 04/23/2016  . 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 Postoperative anemia due to acute blood loss 08/29/2015  . 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 Neuromuscular disease ([place]) 09/26/2014  . Gait difficulty 12/29/2013  . Back pain 12/29/2013  . Paresthesia 12/19/2012  . Essential hypertension 11/28/2012  . Polypharmacy 11/28/2012  . GERD (gastroesophageal reflux disease) 11/28/2012    Current Outpatient Prescriptions on File Prior to Visit    Medication Sig Dispense Refill  . acetaminophen (TYLENOL) 500 MG tablet Take 1,000 mg by mouth every 4 (four) hours as needed for moderate pain.     Marland Kitchen atenolol (TENORMIN) 100 MG tablet Take 1 tablet (100 mg total) by mouth daily. 90 tablet 0  . carbidopa-levodopa (SINEMET CR) 50-200 MG tablet TAKE ONE TABLET BY MOUTH AT BEDTIME 90 tablet 1  . carbidopa-levodopa (SINEMET IR) 25-100 MG tablet Take 1 tablet 6 times daily (Patient taking differently: Take 1 tablet by mouth 3 (three) times daily. ) 540 tablet 2  . cloNIDine (CATAPRES) 0.1 MG tablet Take 1 tablet (0.1 mg total) by mouth 2 (two) times daily. 60 tablet 0  . hydrALAZINE (APRESOLINE) 25 MG tablet Take 1 tablet (25 mg total) by mouth 3 (three) times daily. Increase to qid if needed for blood pressure >160/90 (either). 120 tablet 3  . irbesartan (AVAPRO) 300 MG tablet Take 1 tablet (300 mg total) by mouth daily. 90 tablet 0  . omeprazole (PRILOSEC) 40 MG capsule Take 1 capsule (40 mg total) by mouth daily. (Patient taking differently: Take 80 mg by mouth daily. ) 90 capsule 3  . tolterodine (DETROL LA) 4 MG 24 hr capsule Take 4 mg by mouth daily.  0   No current facility-administered medications on file prior to visit.     Allergies  Allergen Reactions  . Sulfa Antibiotics     Long time ago and does not remember reaction     Objective:  BP 110/88   Pulse 88   Temp 98.3 F (36.8 C)   Resp 16   Ht 5\' 8"  (1.727 m)   Wt  253 lb (114.8 kg)   SpO2 96%   BMI 38.47 kg/m   Physical Exam  Constitutional: She is oriented to person, place, and time and well-developed, well-nourished, and in no distress. No distress.  Cardiovascular: Normal rate and regular rhythm.   Murmur heard. Neurological: She is alert and oriented to person, place, and time. GCS score is 15.  Skin: Skin is warm and dry.  Psychiatric: Mood, memory, affect and judgment normal.  Vitals reviewed.   Assessment and Plan :  1. Elevated blood pressure reading 2.  Essential hypertension - Recheck vitals - Uncontrolled HTN. Pt presents for con't elevated home blood pressure readings. C/o con't headaches, no other symptoms. The monitor she brought with her today from home consistently reads higher than our readings in office. Advise pt to buy new monitor. She was unaware of Catapress 0.1mg  at her pharmacy. She plans to start taking this as directed, in addition to her other medications. Plans to visit Dr. Brigitte Pulse Nov 5. Will con't keep log of home blood pressures.   Mercer Pod, PA-C  [facility] 08/22/2017 2:09 PM

## 2017-08-22 NOTE — Patient Instructions (Addendum)
Pick up clonidine at your pharmacy. Take this as directed. Keep your appt with Dr. Brigitte Pulse Nov. 5.  Get a new blood pressure monitor.  For now continue the hydralazine as needed.    Thank you for coming in today. I hope you feel we met your needs.  Feel free to call PCP if you have any questions or further requests.  Please consider signing up for MyChart if you do not already have it, as this is a great way to communicate with me.  Best,  Whitney McVey, PA-C  IF you received an x-ray today, you will receive an invoice from [facility]. Please contact [facility] at [phone] with questions or concerns regarding your invoice.   IF you received labwork today, you will receive an invoice from [facility]. Please contact [facility] at [phone] with questions or concerns regarding your invoice.   Our billing staff will not be able to assist you with questions regarding bills from these companies.  You will be contacted with the lab results as soon as they are available. The fastest way to get your results is to activate your My Chart account. Instructions are located on the last page of this paperwork. If you have not heard from [place] regarding the results in 2 weeks, please contact this office.

## 2017-08-26 ENCOUNTER — Ambulatory Visit: Payer: Medicare Other | Admitting: Physical Therapy

## 2017-08-26 ENCOUNTER — Encounter: Payer: Self-pay | Admitting: Physical Therapy

## 2017-08-26 ENCOUNTER — Ambulatory Visit: Payer: Medicare Other | Admitting: Occupational Therapy

## 2017-08-26 VITALS — BP 155/90

## 2017-08-26 DIAGNOSIS — R293 Abnormal posture: Secondary | ICD-10-CM | POA: Diagnosis not present

## 2017-08-26 DIAGNOSIS — R29818 Other symptoms and signs involving the nervous system: Secondary | ICD-10-CM

## 2017-08-26 DIAGNOSIS — R2689 Other abnormalities of gait and mobility: Secondary | ICD-10-CM

## 2017-08-26 DIAGNOSIS — R29898 Other symptoms and signs involving the musculoskeletal system: Secondary | ICD-10-CM | POA: Diagnosis not present

## 2017-08-26 DIAGNOSIS — R2681 Unsteadiness on feet: Secondary | ICD-10-CM

## 2017-08-26 DIAGNOSIS — R278 Other lack of coordination: Secondary | ICD-10-CM

## 2017-08-26 DIAGNOSIS — R260 Ataxic gait: Secondary | ICD-10-CM | POA: Diagnosis not present

## 2017-08-26 NOTE — Therapy (Signed)
[facility] [address] [place], [name], [address] Phone: [phone]   Fax:  [phone]  Physical Therapy Treatment  Patient Details  Name: Jacqueline Atkinson MRN: [id number] Date of Birth: 09-09-1956 Referring Provider: Wells Guiles Tat  Encounter Date: 08/26/2017      PT End of Session - 08/26/17 2140    Visit Number 2   Number of Visits 17   Date for PT Re-Evaluation 10/17/17   Authorization Type [employer]   PT Start Time 6222   PT Stop Time 1620   PT Time Calculation (min) 45 min   Equipment Utilized During Treatment Gait belt   Activity Tolerance Patient limited by fatigue   Behavior During Therapy [facility] for tasks assessed/performed      Past Medical History:  Diagnosis Date  . Allergy   . Anemia   . Anxiety   . Arthritis    Left knee  . Edema extremities    lower extremity edema left greater than right.  Marland Kitchen GERD (gastroesophageal reflux disease)   . Heart murmur   . History of hiatal hernia    small  . Hx of seasonal allergies    occ. use OTC Mucinex or Antihistamine.  . Hyperlipidemia   . Hypertension   . Mobility impaired    ambulates with walker, tendency to lose balance.  . 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 Neuromuscular disorder ([place])    "multi system atrophy disease"- "pain, bilateral foot numbness, left knee pain"- Dr. Carles Collet is following.  . 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 ESOPHAGOGASTRODUODENOSCOPY ENDOSCOPY     with esophageal dilation  . FOOT SURGERY  2011   3rd metatarsal LT foot  . KNEE ARTHROSCOPY Left 02/08/2015   Procedure: LEFT KNEE ARTHROSCOPY ;  Surgeon: Latanya Maudlin, MD;  Location: [facility];  Service: Orthopedics;  Laterality: Left;  . TONSILLECTOMY AND ADENOIDECTOMY    .  TOTAL KNEE ARTHROPLASTY Left 08/22/2015   Procedure: LEFT TOTAL  KNEE  ARTHROPLASTY;  Surgeon: Latanya Maudlin, MD;  Location: [facility];  Service: Orthopedics;  Laterality: Left;    There were no vitals filed for this visit.      Subjective Assessment - 08/26/17 1536    Subjective "I have not been able to get down on the floor for months because it is hard to get up. I'd like to be able to get down to do my stretches and be able to get off the floor in case I fall." Reports Dr. Carles Collet told her not to use her rollator as she was pushing it too far ahead causing pain and decreased safety. In January she got a lift chair and tries to only use it to lift her when absolutely necessary.    Pertinent History chronic back pain, L knee replacement 08/23/15 (median ligament crosses over the patella with knee extension-limits WB flexion>ext with standing or riding bike), reflux.   How long can you stand comfortably? Cannot stand >10-15 minutes before too much hip pain   Patient Stated Goals Want to be able to stand longer, work on safety with walker, getting in and out of van   Currently in Pain? No/denies  [facility] Adult PT Treatment/Exercise - 08/26/17 0001      Transfers   Transfers Sit to Stand;Stand to Sit   Sit to Stand 6: Modified independent (Device/Increase time);With upper extremity assist   Stand to Sit 5: Supervision   Stand to Sit Details tends to "park" RW off to the side and then turn to take several steps to the chair or bed.    Comments for preparation for floor transfer, pt knelt down on bench ~12" high with bil UE support on mat table. She was then able to push up on mat table with bil UEs and get Rt foot under herself and push up to stand. Repeated process down onto garden bench ~5 inches high     Ambulation/Gait   Ambulation/Gait Yes   Ambulation/Gait Assistance 5: Supervision   Ambulation/Gait Assistance Details supervision for safe use of DME    Ambulation Distance (Feet) 240 Feet  120   Assistive device Rolling walker  vs U-step   Gait Pattern Step-through pattern;Decreased step length - left;Decreased step length - right;Trunk flexed;Poor foot clearance - right   Ambulation Surface Indoor                PT Education - 08/26/17 2137    Education provided Yes   Education Details do not try floor transfer at home yet; would need MD to make a referral for U-step walker   Person(s) Educated Patient   Methods Explanation;Demonstration;Verbal cues   Comprehension Verbalized understanding;Returned demonstration          PT Short Term Goals - 08/26/17 2155      PT SHORT TERM GOAL #1   Title Pt will be independent with HEP for improved transfers, gait and balance.  TARGET 09/19/17   Time 4   Period Weeks   Status New     PT SHORT TERM GOAL #2   Title Pt will improve TUG score to less than or equal to 18 seconds for decreased fall risk.   Time 4   Period Weeks   Status New     PT SHORT TERM GOAL #3   Title Pt will improve gait velocity to at least 1.8 ft/sec for improved gait efficiency and safety.   Time 4   Period Weeks   Status New     PT SHORT TERM GOAL #4   Title Pt will verbalize understanding of fall prevention in the home environment.   Time 4   Period Weeks   Status New     PT SHORT TERM GOAL #5   Title Patient able to transfer to floor and up off the floor with min assist.   Time 4   Period Weeks   Status New   Target Date 09/19/17           PT Long Term Goals - 08/18/17 1126      PT LONG TERM GOAL #1   Title Pt will be independent with progression of HEP to address posture, balance, transfers and gait.  TARGET 10/17/17   Time 8   Period Weeks   Status New   Target Date 10/17/17     PT LONG TERM GOAL #2   Title Pt will improve TUG cognitive score to less than or equal to 18 seconds for decreased fall risk.   Time 8   Period Weeks   Status New   Target Date 10/17/17     PT  LONG TERM GOAL #3   Title Pt will improve gait  velocity to at least 2 ft/sec for improved gait efficiency and safety.   Time 8   Period Weeks   Status New   Target Date 10/17/17     PT LONG TERM GOAL #4   Title Pt will improve 3 minute walk test by 50 ft for improved efficiency and safety with gait.   Baseline 347 ft in 3 minutes at eval   Time 8   Period Weeks   Status New   Target Date 10/17/17     PT LONG TERM GOAL #5   Title Pt will verbalize plans for ongoing community fitness upon D/C from PT.   Time 8   Period Weeks   Status New   Target Date 10/17/17               Plan - 08/26/17 2142    Clinical Impression Statement BP checked by OT at begining of their session with reported "OK." Patient excited to try U-step walker and found it easier to maintain upright posture and able to sit to stand without it rolling/moving. She is anxious to resume PT as she wants to   maximize her strength and balance. She did want to add to her personal goals being able to get up off the floor if she were to fall. Initiated floor transfer training and will add to goals.     Rehab Potential Good   Clinical Impairments Affecting Rehab Potential Limitations in L knee (per pt report:  median ligament crosses patella when going flexion>extension and is very painful; pt has adapted, but is limited in weightbearing flex>ext movement patterns)   PT Frequency 2x / week   PT Duration 8 weeks   PT Treatment/Interventions ADLs/Self Care Home Management;Gait training;DME Instruction;Functional mobility training;Therapeutic activities;Therapeutic exercise;Balance training;Neuromuscular re-education;Patient/family education   PT Next Visit Plan Initiate HEP to address posture, balance, weightshifting. Work with Estanislado Pandy and determine if patient wants to pursue MD orde for the RW. may try PWR! Moves in sitting/modified quadruped at counter if able; gait training-walking program, RLE foot clearance and step  length   Consulted and Agree with Plan of Care Patient      Patient will benefit from skilled therapeutic intervention in order to improve the following deficits and impairments:  Abnormal gait, Decreased balance, Decreased activity tolerance, Decreased mobility, Decreased strength, Difficulty walking, Postural dysfunction  Visit Diagnosis: Other abnormalities of gait and mobility  Unsteadiness on feet     Problem List Patient Active Problem List   Diagnosis Date Noted  . Dyspnea on exertion 08/05/2017  . Hyperlipidemia 04/23/2016  . Arthritis 04/23/2016  . 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 Postoperative anemia due to acute blood loss 08/29/2015  . 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 Neuromuscular disease ([employer]) 09/26/2014  . Gait difficulty 12/29/2013  . Back pain 12/29/2013  . Paresthesia 12/19/2012  . Essential hypertension 11/28/2012  . Polypharmacy 11/28/2012  . GERD (gastroesophageal reflux disease) 11/28/2012    Rexanne Mano, PT 08/26/2017, 9:57 PM  [facility] [address] [place], [name], [address] Phone: [phone]   Fax:  [phone]  Name: Jacqueline Atkinson MRN: [id number] Date of Birth: [birth year]

## 2017-08-26 NOTE — Telephone Encounter (Signed)
Left detailed VM.  

## 2017-08-26 NOTE — Patient Instructions (Signed)

## 2017-08-26 NOTE — Therapy (Addendum)
[facility] [address] [place], [name], [address] Phone: [phone]   Fax:  [phone]  Occupational Therapy Treatment  Patient Details  Name: Jacqueline Atkinson MRN: [id number] Date of Birth: 11/23/1955 Referring Provider: Dr. Wells Guiles Tat   Encounter Date: 08/26/2017    Past Medical History:  Diagnosis Date  . Allergy   . Anemia   . Anxiety   . Arthritis    Left knee  . Edema extremities    lower extremity edema left greater than right.  Marland Kitchen GERD (gastroesophageal reflux disease)   . Heart murmur   . History of hiatal hernia    small  . Hx of seasonal allergies    occ. use OTC Mucinex or Antihistamine.  . Hyperlipidemia   . Hypertension   . Mobility impaired    ambulates with walker, tendency to lose balance.  . 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 Neuromuscular disorder ([place])    "multi system atrophy disease"- "pain, bilateral foot numbness, left knee pain"- Dr. Carles Collet is following.  . 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 ESOPHAGOGASTRODUODENOSCOPY ENDOSCOPY     with esophageal dilation  . FOOT SURGERY  2011   3rd metatarsal LT foot  . KNEE ARTHROSCOPY Left 02/08/2015   Procedure: LEFT KNEE ARTHROSCOPY ;  Surgeon: Latanya Maudlin, MD;  Location: [facility];  Service: Orthopedics;  Laterality: Left;  . TONSILLECTOMY AND ADENOIDECTOMY    . TOTAL KNEE ARTHROPLASTY Left 08/22/2015   Procedure: LEFT TOTAL  KNEE  ARTHROPLASTY;  Surgeon: Latanya Maudlin, MD;  Location: [facility];  Service: Orthopedics;  Laterality: Left;    Vitals:   08/26/17 1409  BP: (!) 155/90        Subjective Assessment - 08/26/17 1559    Subjective  Pt reports BP is better   Pertinent History Multiple Systems Atrophy (MSA); L knee replacement  2016; HTN; migraines; dyspnea on exertion; hyperlipidemia   Limitations **check BP; fall risk   Patient Stated Goals improve use of R hand for painting, writing   Currently in Pain? No/denies        Time in :2:00, Time out 2:45 pm       Treatment: Handwriting task using various grips, pt demonstrates improved handwriting with tripod grip when she slows down, min v.c grip issued Arm bike x 6 mins level 1 for conditioning               OT Education - 08/26/17 1554    Education provided Yes   Education Details PWR! hands basic 4, supine PWR! up, rock and twist 10-20 reps each   Person(s) Educated Patient   Methods Explanation;Demonstration;Verbal cues;Handout   Comprehension Verbalized understanding;Returned demonstration;Verbal cues required          OT Short Term Goals - 08/26/17 1555      OT SHORT TERM GOAL #1   Title Pt will be independent with updated HEP.--check STGs 09/17/17   Time 4   Period Weeks   Status New     OT SHORT TERM GOAL #2   Title Pt will improve RUE coordination/functional reaching as shown by improving score on box and blocks test by at least 4 blocks.   Baseline R-42   Time 4   Period Weeks   Status New  OT SHORT TERM GOAL #3   Title Pt will report incr ease with LB dressing using strategies/AE prn.   Time 4   Period Weeks   Status New     OT SHORT TERM GOAL #4   Title Pt will verbalize understanding of appropriate community resources and ways to prevent future complications.   Time 4   Period Weeks   Status New     OT SHORT TERM GOAL #5   Title Pt will verbalize understanding of compensatory strategies to minimize hand cramps, fatigue and maintain legibility of handwriting.   Baseline hands cramp and fatigue quicly, pt demonstrates good legibility of handwriting but she writes very quicly at times   Time 4   Period Weeks   Status New           OT Long Term Goals - 08/18/17 1257      OT LONG TERM GOAL #1    Title Pt will verbalize understanding of adaptive strategies/AE for ADLs/IADLs (including strategies for putting on make-up, donning/doffing bathing suit and leggings, eating, writing, painting).--check LTGs 10/17/17   Time 8   Period Weeks   Status New     OT LONG TERM GOAL #2   Title Pt will improve RUE coordination/functional reaching as shown by improving score on box and blocks test by at least 8 blocks.   Baseline R-42 blocks   Time 8   Period Weeks   Status New     OT LONG TERM GOAL #3   Title Pt will improve coordination for ADLs as shown by improving time on 9-hole peg test by at least 5sec with RUE.   Baseline R-37.71sec, L-31.34sec   Time 8   Period Weeks   Status New     OT LONG TERM GOAL #4   Title Pt will report incr ease with writing/painting.   Time 8   Period Weeks   Status New     OT LONG TERM GOAL #5   Title Pt will report using RUE as dominant UE for eating at least 90% of the time.   Time 8   Period Weeks   Status New     Long Term Additional Goals   Additional Long Term Goals --               Plan - 08/26/17 1557    Clinical Impression Statement Pt is progressing towards goals. she demonstrates understanding of inital HEP.   Rehab Potential Good   OT Frequency 2x / week   OT Duration 8 weeks   OT Treatment/Interventions Self-care/ADL training;Moist Heat;DME and/or AE instruction;Splinting;Patient/family education;Fluidtherapy;Therapeutic exercises;Therapeutic activities;Balance training;Therapeutic exercise;Neuromuscular education;Functional Mobility Training;Passive range of motion;Manual Therapy;Energy conservation;[place];Cryotherapy;Aquatic Therapy   Plan coordination HEP   OT Home Exercise Plan issued PWR! hands, PWR! supine up, rock and twist, tripod grip for pen   Consulted and Agree with Plan of Care Patient      Patient will benefit from skilled therapeutic intervention in order to improve the following deficits and impairments:   Decreased knowledge of use of DME, Decreased mobility, Decreased strength, Impaired UE functional use, Decreased balance, Decreased activity tolerance, Impaired tone, Improper spinal/pelvic alignment, Decreased endurance, Decreased coordination, Decreased range of motion  Visit Diagnosis: Other abnormalities of gait and mobility  Unsteadiness on feet  Other symptoms and signs involving the nervous system  Other symptoms and signs involving the musculoskeletal system  Other lack of coordination    Problem List Patient Active Problem List   Diagnosis Date  Noted  . Dyspnea on exertion 08/05/2017  . Hyperlipidemia 04/23/2016  . Arthritis 04/23/2016  . 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 Postoperative anemia due to acute blood loss 08/29/2015  . 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 Neuromuscular disease ([place]) 09/26/2014  . Gait difficulty 12/29/2013  . Back pain 12/29/2013  . Paresthesia 12/19/2012  . Essential hypertension 11/28/2012  . Polypharmacy 11/28/2012  . GERD (gastroesophageal reflux disease) 11/28/2012    Jacqueline Atkinson 08/26/2017, 4:00 PM  [facility] [address] [place], [name], [address] Phone: [phone]   Fax:  [phone]  Name: Jacqueline Atkinson MRN: [id number] Date of Birth: 07-May-1956

## 2017-08-28 ENCOUNTER — Telehealth: Payer: Self-pay | Admitting: Neurology

## 2017-08-28 DIAGNOSIS — G239 Degenerative disease of basal ganglia, unspecified: Principal | ICD-10-CM

## 2017-08-28 DIAGNOSIS — G903 Multi-system degeneration of the autonomic nervous system: Secondary | ICD-10-CM

## 2017-08-28 NOTE — Telephone Encounter (Signed)
-----   Message from Arthur, DO sent at 08/28/2017  7:35 AM EDT ----- Regarding: RE: ? U-step walker Sure.  I copied my MA on this as well or I will totally forget by the time the patient comes in.  Thank you for the update.  Alonza Bogus, DO Director of Movement [place]  ----- Message ----- From: Rexanne Mano, PT Sent: 08/27/2017  12:12 PM To: Eustace Quail Tat, DO Subject: ? U-step walker                                Dr. Carles Collet,  Ms. Bartunek is currently working with [facility] at [facility] [employer]. She was explaining you had recommended she not use her rollator (getting away from her, flexed posture, contributing to hip pain?) and she has been using her 2 wheel walker since your discussion. She expressed frustration at not having a seat to sit on when in community (ex. When she goes out to eat, she has difficulty standing from regular chair in restaurant, and was using rollator as her seat because it is higher).  During last session we practiced using the U-step walker and she did well with maintaining upright posture. This walker is heavier than a rollator and she has to maintain squeezing the handles to release the brakes. Both of these features seem to help her have better control of the device and more upright posture.  She reports she has an upcoming appointment with you. If you agree that the U-step walker can benefit her, could you please discuss this with her, document in your note, and provide an order for the walker? (in Epic or FAX to [facility] [phone])  Please don't hesitate to contact me with questions or concerns. Thank you for your time,   Barry Brunner, PT [facility] [address] [place], [place] [address] [phone]

## 2017-08-28 NOTE — Telephone Encounter (Signed)
Order entered for UStep walker. Patient does not have an appointment until January 2019.

## 2017-08-29 ENCOUNTER — Encounter: Payer: Self-pay | Admitting: Physical Therapy

## 2017-08-29 ENCOUNTER — Ambulatory Visit: Payer: Medicare Other | Attending: Neurology | Admitting: Physical Therapy

## 2017-08-29 ENCOUNTER — Ambulatory Visit: Payer: Medicare Other | Admitting: Occupational Therapy

## 2017-08-29 DIAGNOSIS — R278 Other lack of coordination: Secondary | ICD-10-CM | POA: Diagnosis not present

## 2017-08-29 DIAGNOSIS — R293 Abnormal posture: Secondary | ICD-10-CM | POA: Diagnosis not present

## 2017-08-29 DIAGNOSIS — R29818 Other symptoms and signs involving the nervous system: Secondary | ICD-10-CM | POA: Diagnosis not present

## 2017-08-29 DIAGNOSIS — R2689 Other abnormalities of gait and mobility: Secondary | ICD-10-CM | POA: Diagnosis not present

## 2017-08-29 DIAGNOSIS — R2681 Unsteadiness on feet: Secondary | ICD-10-CM | POA: Diagnosis not present

## 2017-08-29 DIAGNOSIS — R29898 Other symptoms and signs involving the musculoskeletal system: Secondary | ICD-10-CM | POA: Diagnosis not present

## 2017-08-29 NOTE — Therapy (Addendum)
[facility] [address] [place], [name], [address] Phone: [phone]   Fax:  [phone]  Occupational Therapy Treatment  Patient Details  Name: Jacqueline Atkinson MRN: [id number] Date of Birth: [birth year] Referring Provider: Dr. Wells Guiles Tat   Encounter Date: 08/29/2017      OT End of Session - 08/29/17 0924    Visit Number 3   Number of Visits 17   Date for OT Re-Evaluation 10/17/17   Authorization - Visit Number 3   Authorization - Number of Visits 10   OT Start Time 0850   OT Stop Time 0930   OT Time Calculation (min) 40 min   Activity Tolerance Patient tolerated treatment well   Behavior During Therapy [facility] for tasks assessed/performed      Past Medical History:  Diagnosis Date  . Allergy   . Anemia   . Anxiety   . Arthritis    Left knee  . Edema extremities    lower extremity edema left greater than right.  Marland Kitchen GERD (gastroesophageal reflux disease)   . Heart murmur   . History of hiatal hernia    small  . Hx of seasonal allergies    occ. use OTC Mucinex or Antihistamine.  . Hyperlipidemia   . Hypertension   . Mobility impaired    ambulates with walker, tendency to lose balance.  . 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 Neuromuscular disorder ([place])    "multi system atrophy disease"- "pain, bilateral foot numbness, left knee pain"- Dr. Carles Collet is following.  . 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 ESOPHAGOGASTRODUODENOSCOPY ENDOSCOPY     with esophageal dilation  . FOOT SURGERY  2011   3rd metatarsal LT foot  . KNEE ARTHROSCOPY Left 02/08/2015   Procedure: LEFT KNEE ARTHROSCOPY ;  Surgeon: Latanya Maudlin, MD;  Location: [facility];  Service: Orthopedics;  Laterality: Left;  . TONSILLECTOMY AND ADENOIDECTOMY    .  TOTAL KNEE ARTHROPLASTY Left 08/22/2015   Procedure: LEFT TOTAL  KNEE  ARTHROPLASTY;  Surgeon: Latanya Maudlin, MD;  Location: [facility];  Service: Orthopedics;  Laterality: Left;    There were no vitals filed for this visit.      Subjective Assessment - 08/29/17 0927    Pertinent History Multiple Systems Atrophy (MSA); L knee replacement 2016; HTN; migraines; dyspnea on exertion; hyperlipidemia   Patient Stated Goals improve use of R hand for painting, writing   Currently in Pain? No/denies                Treatment: Supine ball exercises for shoulder flexion and chest press x 10 reps, PWR! rock x 10 min v.c Functional reaching with trunk rotation to copy small peg design with bilateral UE's, min difficulty/ v.c               OT Education - 08/29/17 0921    Education provided Yes   Education Details figure 4 stretch prior to LB dressing, reviewed PWR! basic 4 seated 10 reps each   Person(s) Educated Patient   Methods Explanation;Demonstration   Comprehension Verbalized understanding;Returned demonstration;Verbal cues required          OT Short Term Goals - 08/26/17 1555      OT SHORT TERM GOAL #1   Title Pt will be  independent with updated HEP.--check STGs 09/17/17   Time 4   Period Weeks   Status New     OT SHORT TERM GOAL #2   Title Pt will improve RUE coordination/functional reaching as shown by improving score on box and blocks test by at least 4 blocks.   Baseline R-42   Time 4   Period Weeks   Status New     OT SHORT TERM GOAL #3   Title Pt will report incr ease with LB dressing using strategies/AE prn.   Time 4   Period Weeks   Status New     OT SHORT TERM GOAL #4   Title Pt will verbalize understanding of appropriate community resources and ways to prevent future complications.   Time 4   Period Weeks   Status New     OT SHORT TERM GOAL #5   Title Pt will verbalize understanding of compensatory strategies to minimize hand cramps,  fatigue and maintain legibility of handwriting.   Baseline hands cramp and fatigue quicly, pt demonstrates good legibility of handwriting but she writes very quicly at times   Time 4   Period Weeks   Status New           OT Long Term Goals - 08/18/17 1257      OT LONG TERM GOAL #1   Title Pt will verbalize understanding of adaptive strategies/AE for ADLs/IADLs (including strategies for putting on make-up, donning/doffing bathing suit and leggings, eating, writing, painting).--check LTGs 10/17/17   Time 8   Period Weeks   Status New     OT LONG TERM GOAL #2   Title Pt will improve RUE coordination/functional reaching as shown by improving score on box and blocks test by at least 8 blocks.   Baseline R-42 blocks   Time 8   Period Weeks   Status New     OT LONG TERM GOAL #3   Title Pt will improve coordination for ADLs as shown by improving time on 9-hole peg test by at least 5sec with RUE.   Baseline R-37.71sec, L-31.34sec   Time 8   Period Weeks   Status New     OT LONG TERM GOAL #4   Title Pt will report incr ease with writing/painting.   Time 8   Period Weeks   Status New     OT LONG TERM GOAL #5   Title Pt will report using RUE as dominant UE for eating at least 90% of the time.   Time 8   Period Weeks   Status New     Long Term Additional Goals   Additional Long Term Goals --               Plan - 08/29/17 0925    Clinical Impression Statement Pt is progressing towards goals for ADLS and UE functional use.   Rehab Potential Good   OT Frequency 2x / week   OT Duration 8 weeks   OT Treatment/Interventions Self-care/ADL training;Moist Heat;DME and/or AE instruction;Splinting;Patient/family education;Fluidtherapy;Therapeutic exercises;Therapeutic activities;Balance training;Therapeutic exercise;Neuromuscular education;Functional Mobility Training;Passive range of motion;Manual Therapy;Energy conservation;Parrafin;Cryotherapy;Aquatic Therapy   Plan  coordination HEP, ADL strategies   OT Home Exercise Plan issued PWR! hands, PWR! supine up, rock and twist, tripod grip for pen   Consulted and Agree with Plan of Care Patient      Patient will benefit from skilled therapeutic intervention in order to improve the following deficits and impairments:  Decreased knowledge of use of DME, Decreased mobility,  Decreased strength, Impaired UE functional use, Decreased balance, Decreased activity tolerance, Impaired tone, Improper spinal/pelvic alignment, Decreased endurance, Decreased coordination, Decreased range of motion  Visit Diagnosis: Other symptoms and signs involving the nervous system  Other symptoms and signs involving the musculoskeletal system  Other lack of coordination    Problem List Patient Active Problem List   Diagnosis Date Noted  . Dyspnea on exertion 08/05/2017  . Hyperlipidemia 04/23/2016  . Arthritis 04/23/2016  . 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 Postoperative anemia due to acute blood loss 08/29/2015  . 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 Neuromuscular disease ([place]) 09/26/2014  . Gait difficulty 12/29/2013  . Back pain 12/29/2013  . Paresthesia 12/19/2012  . Essential hypertension 11/28/2012  . Polypharmacy 11/28/2012  . GERD (gastroesophageal reflux disease) 11/28/2012    Martika Egler 08/29/2017, 5:05 PM  [facility] [address] [place], [name], [address] Phone: [phone]   Fax:  [phone]  Name: Jacqueline Atkinson MRN: [id number] Date of Birth: September 23, 1956

## 2017-08-29 NOTE — Therapy (Signed)
[facility] [address] [place], [name], [address] Phone: [phone]   Fax:  [phone]  Physical Therapy Treatment  Patient Details  Name: Jacqueline Atkinson MRN: [id number] Date of Birth: 01-Mar-1956 Referring Provider: Wells Guiles Tat  Encounter Date: 08/29/2017      PT End of Session - 08/29/17 0924    Visit Number 3   Number of Visits 17   Date for PT Re-Evaluation 10/17/17   Authorization Type [employer]   PT Start Time 0802   PT Stop Time 0843   PT Time Calculation (min) 41 min   Equipment Utilized During Treatment Gait belt   Activity Tolerance Patient tolerated treatment well   Behavior During Therapy [facility] for tasks assessed/performed      Past Medical History:  Diagnosis Date  . Allergy   . Anemia   . Anxiety   . Arthritis    Left knee  . Edema extremities    lower extremity edema left greater than right.  Marland Kitchen GERD (gastroesophageal reflux disease)   . Heart murmur   . History of hiatal hernia    small  . Hx of seasonal allergies    occ. use OTC Mucinex or Antihistamine.  . Hyperlipidemia   . Hypertension   . Mobility impaired    ambulates with walker, tendency to lose balance.  . 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 Neuromuscular disorder ([place])    "multi system atrophy disease"- "pain, bilateral foot numbness, left knee pain"- Dr. Carles Collet is following.  . 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 ESOPHAGOGASTRODUODENOSCOPY ENDOSCOPY     with esophageal dilation  . FOOT SURGERY  2011   3rd metatarsal LT foot  . KNEE ARTHROSCOPY Left 02/08/2015   Procedure: LEFT KNEE ARTHROSCOPY ;  Surgeon: Latanya Maudlin, MD;  [facility];  Service: Orthopedics;  Laterality: Left;  . TONSILLECTOMY AND ADENOIDECTOMY     . TOTAL KNEE ARTHROPLASTY Left 08/22/2015   Procedure: LEFT TOTAL  KNEE  ARTHROPLASTY;  Surgeon: Latanya Maudlin, MD;  Location: [facility];  Service: Orthopedics;  Laterality: Left;    There were no vitals filed for this visit.      Subjective Assessment - 08/29/17 0806    Pertinent History chronic back pain, L knee replacement 08/23/15 (median ligament crosses over the patella with knee extension-limits WB flexion>ext with standing or riding bike), reflux.   How long can you stand comfortably? Cannot stand >10-15 minutes before too much hip pain   Patient Stated Goals Want to be able to stand longer, work on safety with walker, getting in and out of van   Currently in Pain? Yes   Pain Score 2    Pain Location Hip   Pain Orientation Left   Pain Descriptors / Indicators Aching   Pain Type Chronic pain   Pain Onset More than a month ago   Pain Frequency Constant                         [facility] Adult PT Treatment/Exercise - 08/29/17 0841      Transfers   Transfers Sit to Stand;Stand to Sit;Floor to Transfer   Sit to Stand 6: Modified independent (Device/Increase time)   Stand to Sit 5: Supervision  Stand to Sit Details vc for safe approach to surface and keeping walker with her whle turning   Floor to Transfer 4: Min assist;With upper extremity assist   Floor to Transfer Details (indicate cue type and reason) from mat table, able to put hands on table and use RLE to lower down to floor on left knee (on red mat); side sit to her right and worked into long sit and then supine; to get up-moved into quadruped, hands on mat table, 1/2 kneeling with RLE forward, pushed up with incr effort until could place LLE under her and then fully stood and pivoted to sit on mat   Number of Reps --  1 rep     Ambulation/Gait   Ambulation/Gait Assistance 5: Supervision   Ambulation/Gait Assistance Details for safety with turns, especially when approaching a seat   Ambulation Distance  (Feet) 240 Feet   Assistive device Other (Comment)  U Step walker   Gait Pattern Step-through pattern;Trunk flexed;Poor foot clearance - right;Decreased stride length   Ambulation Surface Indoor   Gait Comments vc x 3 in 240 ft to remain closer to u-step walker--overall posture much more upright with u-step     Exercises   Exercises Lumbar;Knee/Hip     Lumbar Exercises: Stretches   Double Knee to Chest Stretch 1 rep;30 seconds   Lower Trunk Rotation 3 reps;30 seconds   Quadruped Mid Back Stretch 2 reps;10 seconds     Knee/Hip Exercises: Stretches   Architectural technologist reps;30 seconds   Quad Stretch Limitations prone, propped on elbows, knee flexion   Piriformis Stretch Both;2 reps;30 seconds                PT Education - 08/29/17 1739    Education provided Yes   Education Details furniture at home appropriate for her to use for floor transfers & beginning to work on lowering down onto one knee, then kneeling, then getting back up--only when someone is home in case assist is needed   Person(s) Educated Patient   Methods Explanation;Demonstration;Verbal cues   Comprehension Verbalized understanding;Returned demonstration;Verbal cues required          PT Short Term Goals - 08/26/17 2155      PT SHORT TERM GOAL #1   Title Pt will be independent with HEP for improved transfers, gait and balance.  TARGET 09/19/17   Time 4   Period Weeks   Status New     PT SHORT TERM GOAL #2   Title Pt will improve TUG score to less than or equal to 18 seconds for decreased fall risk.   Time 4   Period Weeks   Status New     PT SHORT TERM GOAL #3   Title Pt will improve gait velocity to at least 1.8 ft/sec for improved gait efficiency and safety.   Time 4   Period Weeks   Status New     PT SHORT TERM GOAL #4   Title Pt will verbalize understanding of fall prevention in the home environment.   Time 4   Period Weeks   Status New     PT SHORT TERM GOAL #5   Title Patient  able to transfer to floor and up off the floor with min assist.   Time 4   Period Weeks   Status New   Target Date 09/19/17           PT Long Term Goals - 08/18/17 1126      PT  LONG TERM GOAL #1   Title Pt will be independent with progression of HEP to address posture, balance, transfers and gait.  TARGET 10/17/17   Time 8   Period Weeks   Status New   Target Date 10/17/17     PT LONG TERM GOAL #2   Title Pt will improve TUG cognitive score to less than or equal to 18 seconds for decreased fall risk.   Time 8   Period Weeks   Status New   Target Date 10/17/17     PT LONG TERM GOAL #3   Title Pt will improve gait velocity to at least 2 ft/sec for improved gait efficiency and safety.   Time 8   Period Weeks   Status New   Target Date 10/17/17     PT LONG TERM GOAL #4   Title Pt will improve 3 minute walk test by 50 ft for improved efficiency and safety with gait.   Baseline 347 ft in 3 minutes at eval   Time 8   Period Weeks   Status New   Target Date 10/17/17     PT LONG TERM GOAL #5   Title Pt will verbalize plans for ongoing community fitness upon D/C from PT.   Time 8   Period Weeks   Status New   Target Date 10/17/17               Plan - 08/29/17 1743    Clinical Impression Statement Session focused on educating on use of U-step walker, providing pt a copy of the physician's order for U-step, and instructing her the best way to get the walker is for her to work with the company and her insurance directly. Remainder of session worked on floor transfers and stretches she can do for LBP and quad discomfort/cramping. Patient very pleased with her progress and can continue to benefit from PT.    Rehab Potential Good   Clinical Impairments Affecting Rehab Potential Limitations in L knee (per pt report:  median ligament crosses patella when going flexion>extension and is very painful; pt has adapted, but is limited in weightbearing flex>ext movement patterns)    PT Frequency 2x / week   PT Duration 8 weeks   PT Treatment/Interventions ADLs/Self Care Home Management;Gait training;DME Instruction;Functional mobility training;Therapeutic activities;Therapeutic exercise;Balance training;Neuromuscular re-education;Patient/family education   PT Next Visit Plan Continue add to HEP to address posture, balance, weightshifting.  may try PWR! Moves in sitting/modified quadruped at counter if able; gait training-walking program, RLE foot clearance and step length   Consulted and Agree with Plan of Care Patient      Patient will benefit from skilled therapeutic intervention in order to improve the following deficits and impairments:  Abnormal gait, Decreased balance, Decreased activity tolerance, Decreased mobility, Decreased strength, Difficulty walking, Postural dysfunction  Visit Diagnosis: Other abnormalities of gait and mobility  Unsteadiness on feet  Other symptoms and signs involving the nervous system  Other symptoms and signs involving the musculoskeletal system     Problem List Patient Active Problem List   Diagnosis Date Noted  . Dyspnea on exertion 08/05/2017  . Hyperlipidemia 04/23/2016  . Arthritis 04/23/2016  . 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 Postoperative anemia due to acute blood loss 08/29/2015  . 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 Neuromuscular disease ([place]) 09/26/2014  . Gait difficulty 12/29/2013  . Back pain 12/29/2013  . Paresthesia 12/19/2012  . Essential hypertension 11/28/2012  . Polypharmacy 11/28/2012  .  GERD (gastroesophageal reflux disease) 11/28/2012    Rexanne Mano, PT 08/29/2017, 5:48 PM  [facility] [address] [place], [name], [address] Phone: [phone]   Fax:  [phone]  Name: Jacqueline Atkinson MRN: [id number] Date of Birth: 1956/01/09

## 2017-09-01 ENCOUNTER — Encounter: Payer: Self-pay | Admitting: Family Medicine

## 2017-09-01 ENCOUNTER — Other Ambulatory Visit: Payer: Self-pay

## 2017-09-01 ENCOUNTER — Ambulatory Visit: Payer: Medicare Other | Admitting: Family Medicine

## 2017-09-01 VITALS — BP 110/84 | HR 75 | Temp 98.4°F | Resp 16 | Ht 68.0 in | Wt 255.8 lb

## 2017-09-01 DIAGNOSIS — R269 Unspecified abnormalities of gait and mobility: Secondary | ICD-10-CM | POA: Diagnosis not present

## 2017-09-01 DIAGNOSIS — R0989 Other specified symptoms and signs involving the circulatory and respiratory systems: Secondary | ICD-10-CM

## 2017-09-01 DIAGNOSIS — R32 Unspecified urinary incontinence: Secondary | ICD-10-CM | POA: Diagnosis not present

## 2017-09-01 DIAGNOSIS — G239 Degenerative disease of basal ganglia, unspecified: Secondary | ICD-10-CM | POA: Diagnosis not present

## 2017-09-01 MED ORDER — CLONIDINE HCL 0.1 MG PO TABS: ORAL_TABLET | ORAL | 0 refills | Status: DC

## 2017-09-01 NOTE — Patient Instructions (Addendum)
Stop the hydralazine - this is still fine to take in case of BP spikes. Continue 1 clonidine every morning but increase to 2 tabs in the evening.   Call in about 2-3 weeks and let me know how your blood pressures have been.  Let me know about your heart rate as well.  If your blood pressure are excellent at that time we can stop your atenolol.  If not, we can either restart the hydralazine or further adjust the clonidine.  I will have you follow-up in the office again several weeks after that adjustment.  IF you received an x-ray today, you will receive an invoice from [facility]. Please contact [facility] at [phone] with questions or concerns regarding your invoice.   IF you received labwork today, you will receive an invoice from [facility]. Please contact [facility] at [phone] with questions or concerns regarding your invoice.   Our billing staff will not be able to assist you with questions regarding bills from these companies.  You will be contacted with the lab results as soon as they are available. The fastest way to get your results is to activate your My Chart account. Instructions are located on the last page of this paperwork. If you have not heard from [place] regarding the results in 2 weeks, please contact this office.      Managing Your Hypertension Hypertension is commonly called high blood pressure. This is when the force of your blood pressing against the walls of your arteries is too strong. Arteries are blood vessels that carry blood from your heart throughout your body. Hypertension forces the heart to work harder to pump blood, and may cause the arteries to become narrow or stiff. Having untreated or uncontrolled hypertension can cause heart attack, stroke, kidney disease, and other problems. What are blood pressure readings? A blood pressure reading consists of a higher number over a lower number. Ideally, your blood pressure should be below 120/80. The  first ("top") number is called the systolic pressure. It is a measure of the pressure in your arteries as your heart beats. The second ("bottom") number is called the diastolic pressure. It is a measure of the pressure in your arteries as the heart relaxes. What does my blood pressure reading mean? Blood pressure is classified into four stages. Based on your blood pressure reading, your health care provider may use the following stages to determine what type of treatment you need, if any. Systolic pressure and diastolic pressure are measured in a unit called mm Hg. Normal  Systolic pressure: below 035.  Diastolic pressure: below 80. Elevated  Systolic pressure: 009-381.  Diastolic pressure: below 80. Hypertension stage 1  Systolic pressure: 829-937.  Diastolic pressure: 16-96. Hypertension stage 2  Systolic pressure: 789 or above.  Diastolic pressure: 90 or above. What health risks are associated with hypertension? Managing your hypertension is an important responsibility. Uncontrolled hypertension can lead to:  A heart attack.  A stroke.  A weakened blood vessel (aneurysm).  Heart failure.  Kidney damage.  Eye damage.  Metabolic syndrome.  Memory and concentration problems.  What changes can I make to manage my hypertension? Hypertension can be managed by making lifestyle changes and possibly by taking medicines. Your health care provider will help you make a plan to bring your blood pressure within a normal range. Eating and drinking  Eat a diet that is high in fiber and potassium, and low in salt (sodium), added sugar, and fat. An example eating plan is  called the DASH (Dietary Approaches to Stop Hypertension) diet. To eat this way: ? Eat plenty of fresh fruits and vegetables. Try to fill half of your plate at each meal with fruits and vegetables. ? Eat whole grains, such as whole wheat pasta, brown rice, or whole grain bread. Fill about one quarter of your plate  with whole grains. ? Eat low-fat diary products. ? Avoid fatty cuts of meat, processed or cured meats, and poultry with skin. Fill about one quarter of your plate with lean proteins such as fish, chicken without skin, beans, eggs, and tofu. ? Avoid premade and processed foods. These tend to be higher in sodium, added sugar, and fat.  Reduce your daily sodium intake. Most people with hypertension should eat less than 1,500 mg of sodium a day.  Limit alcohol intake to no more than 1 drink a day for nonpregnant women and 2 drinks a day for men. One drink equals 12 oz of beer, 5 oz of wine, or 1 oz of hard liquor. Lifestyle  Work with your health care provider to maintain a healthy body weight, or to lose weight. Ask what an ideal weight is for you.  Get at least 30 minutes of exercise that causes your heart to beat faster (aerobic exercise) most days of the week. Activities may include walking, swimming, or biking.  Include exercise to strengthen your muscles (resistance exercise), such as weight lifting, as part of your weekly exercise routine. Try to do these types of exercises for 30 minutes at least 3 days a week.  Do not use any products that contain nicotine or tobacco, such as cigarettes and e-cigarettes. If you need help quitting, ask your health care provider.  Control any long-term (chronic) conditions you have, such as high cholesterol or diabetes. Monitoring  Monitor your blood pressure at home as told by your health care provider. Your personal target blood pressure may vary depending on your medical conditions, your age, and other factors.  Have your blood pressure checked regularly, as often as told by your health care provider. Working with your health care provider  Review all the medicines you take with your health care provider because there may be side effects or interactions.  Talk with your health care provider about your diet, exercise habits, and other lifestyle  factors that may be contributing to hypertension.  Visit your health care provider regularly. Your health care provider can help you create and adjust your plan for managing hypertension. Will I need medicine to control my blood pressure? Your health care provider may prescribe medicine if lifestyle changes are not enough to get your blood pressure under control, and if:  Your systolic blood pressure is 130 or higher.  Your diastolic blood pressure is 80 or higher.  Take medicines only as told by your health care provider. Follow the directions carefully. Blood pressure medicines must be taken as prescribed. The medicine does not work as well when you skip doses. Skipping doses also puts you at risk for problems. Contact a health care provider if:  You think you are having a reaction to medicines you have taken.  You have repeated (recurrent) headaches.  You feel dizzy.  You have swelling in your ankles.  You have trouble with your vision. Get help right away if:  You develop a severe headache or confusion.  You have unusual weakness or numbness, or you feel faint.  You have severe pain in your chest or abdomen.  You vomit repeatedly.  You have trouble breathing. Summary  Hypertension is when the force of blood pumping through your arteries is too strong. If this condition is not controlled, it may put you at risk for serious complications.  Your personal target blood pressure may vary depending on your medical conditions, your age, and other factors. For most people, a normal blood pressure is less than 120/80.  Hypertension is managed by lifestyle changes, medicines, or both. Lifestyle changes include weight loss, eating a healthy, low-sodium diet, exercising more, and limiting alcohol. This information is not intended to replace advice given to you by your health care provider. Make sure you discuss any questions you have with your health care provider. Document Released:  07/08/2012 Document Revised: 09/11/2016 Document Reviewed: 09/11/2016 Elsevier Interactive Patient Education  [employer].

## 2017-09-01 NOTE — Progress Notes (Signed)
Subjective:     Patient ID: Jacqueline Atkinson, female    DOB: 07-07-1956, 61 y.o.   MRN: [id number]   Chief Complaint  Patient presents with  . Follow-up    high blood pressure    HPI  HAs resolving. BP does go up at night and is higher then and first thing in a.m.  She does not a little fatigue but can nap. + Dry mouth.   Jacqueline Atkinson is a 61 y.o. female who presents to Primary Care at [facility] for hypertension check up. She was seen by neurologist several times, last seen a week ago, but her BP well controlled. She checks her BP at home. She's been struggling with pedal edema for she was on Lasix 40mg  with potassium and compression socks; although, she's had difficulty putting them on. She noted to her neurologist there has been more swelling in feet and ankles and not using Lasix as caused too much urinary frequency. She is being sent for repeat swallow study due to occasional emesis after eating. She's on Detrol for urinary incontinence having worsening dyspnea on exertion.   HTN: her home BP readings, different times of the day measured. Has NOT been checking her pulse. BP has been very high since stopping dilt and starting atenolol at last visit which is causing HAs but pedal edema MUCH better. Initially BP were 150s-160s but over the past 2 weeks have been [id number] systolic. occ up to 200s. Did get BP 110s or 130s originally but rare - feels fine when BP is lower.  Has to be <180/100 to do PT so she was not able to do PT this wk due to her BP upon arrival.  Pedal edema: Still puffy but much better. Has been able to wear shoes again which is great since being off the cartia.  Past Medical History:  Diagnosis Date  . Allergy   . Anemia   . Anxiety   . Arthritis    Left knee  . Edema extremities    lower extremity edema left greater than right.  Marland Kitchen GERD (gastroesophageal reflux disease)   . Heart murmur   . History of hiatal hernia    small  . Hx of seasonal allergies    occ. use OTC  Mucinex or Antihistamine.  . Hyperlipidemia   . Hypertension   . Mobility impaired    ambulates with walker, tendency to lose balance.  . 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 Neuromuscular disorder ([place])    "multi system atrophy disease"- "pain, bilateral foot numbness, left knee pain"- Dr. Carles Collet is following.  . Shoulder disorder    right shoulder "rigidity due to Multi system atrophy"- ROM not limited"just tires me"   Prior to Admission medications   Medication Sig Start Date End Date Taking? Authorizing Provider  acetaminophen (TYLENOL) 500 MG tablet Take 1,000 mg by mouth every 4 (four) hours as needed for moderate pain.    Yes [provider]  carbidopa-levodopa (SINEMET CR) 50-200 MG tablet Take 1 tablet by mouth 2 (two) times daily. Patient taking differently: Take 1 tablet by mouth at bedtime.  11/12/16  Yes Tat, Eustace Quail, DO  carbidopa-levodopa (SINEMET IR) 25-100 MG tablet Take 1 tablet 6 times daily Patient taking differently: Take 1 tablet by mouth 3 (three) times daily.  06/14/16  Yes Tat, Eustace Quail, DO  clonazePAM (KLONOPIN) 0.5 MG tablet Take 0.5 tablets (0.25 mg total)  by mouth at bedtime. 06/27/17  Yes Tat, Eustace Quail, DO  cyclobenzaprine (FLEXERIL) 10 MG tablet Take 10 mg by mouth daily. 03/02/17  Yes [provider]  diltiazem (CARTIA XT) 240 MG 24 hr capsule Take 1 capsule (240 mg total) by mouth daily. Office visit needed for refills 04/15/17  Yes Shawnee Knapp, MD  losartan (COZAAR) 100 MG tablet Take 1 tablet (100 mg total) by mouth daily. 04/23/16  Yes Shawnee Knapp, MD  omeprazole (PRILOSEC) 40 MG capsule Take 1 capsule (40 mg total) by mouth daily. Patient taking differently: Take 80 mg by mouth daily.  04/23/16  Yes Shawnee Knapp, MD  tolterodine (DETROL LA) 4 MG 24 hr capsule Take 4 mg by mouth daily. 02/12/17  Yes [provider]   Allergies  Allergen Reactions  . Sulfa  Antibiotics     Long time ago and does not remember reaction   Past Surgical History:  Procedure Laterality Date  . ESOPHAGOGASTRODUODENOSCOPY ENDOSCOPY     with esophageal dilation  . FOOT SURGERY  2011   3rd metatarsal LT foot  . TONSILLECTOMY AND ADENOIDECTOMY     Family History  Problem Relation Age of Onset  . Cancer Mother   . Diabetes Mother   . Kidney disease Mother   . COPD Father   . Brain cancer Brother    Social History   Socioeconomic History  . Marital status: Divorced    Spouse name: None  . Number of children: 7  . Years of education: 44  . Highest education level: None  Social Needs  . Financial resource strain: None  . Food insecurity - worry: None  . Food insecurity - inability: None  . Transportation needs - medical: None  . Transportation needs - non-medical: None  Occupational History  . Occupation: ACTIVITY COORDINATOR    Employer: [employer]  Tobacco Use  . Smoking status: Former Smoker    Packs/day: 1.00    Years: 12.00    Pack years: 12.00    Last attempt to quit: 03/14/1988    Years since quitting: 29.4  . Smokeless tobacco: Never Used  Substance and Sexual Activity  . Alcohol use: No  . Drug use: No  . Sexual activity: None  Other Topics Concern  . None  Social History Narrative  . None   Depression screen [facility] 2/9 08/22/2017 08/16/2017 08/04/2017 07/02/2017 04/23/2016  Decreased Interest 0 0 0 0 0  Down, Depressed, Hopeless 0 0 0 0 0  PHQ - 2 Score 0 0 0 0 0    Review of Systems  Constitutional: Negative for chills, fatigue, fever and unexpected weight change.  Respiratory: Negative for cough, chest tightness and shortness of breath.   Cardiovascular: Positive for leg swelling. Negative for chest pain and palpitations.  Gastrointestinal: Negative for abdominal pain, blood in stool, constipation, diarrhea, nausea and vomiting.  Musculoskeletal: Positive for gait problem.  Skin: Negative for rash and wound.    Neurological: Positive for headaches. Negative for dizziness, syncope, weakness and light-headedness.       Objective:   Physical Exam  Constitutional: She is oriented to person, place, and time. She appears well-developed and well-nourished. No distress.  HENT:  Head: Normocephalic and atraumatic.  Eyes: EOM are normal. Pupils are equal, round, and reactive to light.  Neck: Neck supple.  Cardiovascular: Normal rate.  Murmur (left sternal border) heard.  Decrescendo systolic (pansystolic) murmur is present. Occasional skip beats  Pulmonary/Chest: Effort normal. No  respiratory distress.  Musculoskeletal: Normal range of motion.  2+ non pitting edema to the knees  Neurological: She is alert and oriented to person, place, and time.  Skin: Skin is warm and dry.  Psychiatric: She has a normal mood and affect. Her behavior is normal.  Nursing note and vitals reviewed.   BP 110/84   Pulse 75   Temp 98.4 F (36.9 C)   Resp 16   Ht 5\' 8"  (1.727 m)   Wt 255 lb 12.8 oz (116 kg)   SpO2 95%   BMI 38.89 kg/m       Assessment & Plan:   1. Labile blood pressure     No orders of the defined types were placed in this encounter.   Meds ordered this encounter  Medications  . cloNIDine (CATAPRES) 0.1 MG tablet    Sig: Take 1 tab po qam and 2 tabs po qhs    Dispense:  270 tablet    Refill:  0    [name], M.D.  Primary Care at [facility] [address] [place], [place] [address] [phone] phone [phone] fax  09/01/17 1:45 PM

## 2017-09-02 ENCOUNTER — Ambulatory Visit: Payer: Medicare Other | Admitting: Occupational Therapy

## 2017-09-02 ENCOUNTER — Encounter: Payer: Self-pay | Admitting: Occupational Therapy

## 2017-09-02 ENCOUNTER — Ambulatory Visit: Payer: Medicare Other | Admitting: Physical Therapy

## 2017-09-02 ENCOUNTER — Encounter: Payer: Self-pay | Admitting: Physical Therapy

## 2017-09-02 DIAGNOSIS — R29898 Other symptoms and signs involving the musculoskeletal system: Secondary | ICD-10-CM | POA: Diagnosis not present

## 2017-09-02 DIAGNOSIS — R29818 Other symptoms and signs involving the nervous system: Secondary | ICD-10-CM

## 2017-09-02 DIAGNOSIS — R278 Other lack of coordination: Secondary | ICD-10-CM | POA: Diagnosis not present

## 2017-09-02 DIAGNOSIS — R2681 Unsteadiness on feet: Secondary | ICD-10-CM

## 2017-09-02 DIAGNOSIS — R2689 Other abnormalities of gait and mobility: Secondary | ICD-10-CM | POA: Diagnosis not present

## 2017-09-02 DIAGNOSIS — R293 Abnormal posture: Secondary | ICD-10-CM | POA: Diagnosis not present

## 2017-09-02 NOTE — Therapy (Signed)
[facility] [address] [place], [name], [address] Phone: [phone]   Fax:  [phone]  Occupational Therapy Treatment  Patient Details  Name: Jacqueline Atkinson MRN: [id number] Date of Birth: 1956/08/15 Referring Provider: Dr. Wells Guiles Tat    Encounter Date: 09/02/2017  OT End of Session - 09/02/17 1542    Visit Number  4    Number of Visits  17    Date for OT Re-Evaluation  10/17/17    Authorization Type  [employer] Medicare, no visit limit/no auth; G-code needed    Authorization - Visit Number  4    Authorization - Number of Visits  10    OT Start Time  1403    OT Stop Time  1445    OT Time Calculation (min)  42 min    Activity Tolerance  Patient tolerated treatment well    Behavior During Therapy  [facility] for tasks assessed/performed       Past Medical History:  Diagnosis Date  . Allergy   . Anemia   . Anxiety   . Arthritis    Left knee  . Edema extremities    lower extremity edema left greater than right.  Marland Kitchen GERD (gastroesophageal reflux disease)   . Heart murmur   . History of hiatal hernia    small  . Hx of seasonal allergies    occ. use OTC Mucinex or Antihistamine.  . Hyperlipidemia   . Hypertension   . Mobility impaired    ambulates with walker, tendency to lose balance.  . 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 Neuromuscular disorder ([place])    "multi system atrophy disease"- "pain, bilateral foot numbness, left knee pain"- Dr. Carles Collet is following.  . 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 ESOPHAGOGASTRODUODENOSCOPY ENDOSCOPY     with esophageal dilation  . FOOT SURGERY  2011   3rd metatarsal LT foot  . TONSILLECTOMY AND ADENOIDECTOMY      There were no vitals filed for this visit.  Subjective Assessment -  09/02/17 1538    Pertinent History  Multiple Systems Atrophy (MSA); L knee replacement 2016; HTN; migraines; dyspnea on exertion; hyperlipidemia    Patient Stated Goals  improve use of R hand for painting, writing    Currently in Pain?  No/denies              Treatment: Discussed strategies for painting, (use of foam grip on paint brush and putting smaller canvas's on tabletop to minimize arm fatigue). Discussed strategies for brushing her dog, use of big movements and performing when she was not overly fatigued Pt practiced shuffling and dealing cards with big movements. Therapist encouraged stretches and power hands, when pt begins to feel tight or stiff.  Reviewed PWR! Hands basic 4, 5-10 reps each. Placing grooved pegs into pegboard with right then left hand, removing with in hand manipulation, min difficulty particularly with right hand.               OT Short Term Goals - 08/26/17 1555      OT SHORT TERM GOAL #1   Title  Pt will be independent with updated HEP.--check STGs 09/17/17    Time  4    Period  Weeks    Status  New  OT SHORT TERM GOAL #2   Title  Pt will improve RUE coordination/functional reaching as shown by improving score on box and blocks test by at least 4 blocks.    Baseline  R-42    Time  4    Period  Weeks    Status  New      OT SHORT TERM GOAL #3   Title  Pt will report incr ease with LB dressing using strategies/AE prn.    Time  4    Period  Weeks    Status  New      OT SHORT TERM GOAL #4   Title  Pt will verbalize understanding of appropriate community resources and ways to prevent future complications.    Time  4    Period  Weeks    Status  New      OT SHORT TERM GOAL #5   Title  Pt will verbalize understanding of compensatory strategies to minimize hand cramps, fatigue and maintain legibility of handwriting.    Baseline  hands cramp and fatigue quicly, pt demonstrates good legibility of handwriting but she writes very  quicly at times    Time  4    Period  Weeks    Status  New        OT Long Term Goals - 08/18/17 1257      OT LONG TERM GOAL #1   Title  Pt will verbalize understanding of adaptive strategies/AE for ADLs/IADLs (including strategies for putting on make-up, donning/doffing bathing suit and leggings, eating, writing, painting).--check LTGs 10/17/17    Time  8    Period  Weeks    Status  New      OT LONG TERM GOAL #2   Title  Pt will improve RUE coordination/functional reaching as shown by improving score on box and blocks test by at least 8 blocks.    Baseline  R-42 blocks    Time  8    Period  Weeks    Status  New      OT LONG TERM GOAL #3   Title  Pt will improve coordination for ADLs as shown by improving time on 9-hole peg test by at least 5sec with RUE.    Baseline  R-37.71sec, L-31.34sec    Time  8    Period  Weeks    Status  New      OT LONG TERM GOAL #4   Title  Pt will report incr ease with writing/painting.    Time  8    Period  Weeks    Status  New      OT LONG TERM GOAL #5   Title  Pt will report using RUE as dominant UE for eating at least 90% of the time.    Time  8    Period  Weeks    Status  New      Long Term Additional Goals   Additional Long Term Goals  --            Plan - 09/02/17 1544    Clinical Impression Statement  Pt is progressing towards goals for UE functional use and adapted strategies.    Rehab Potential  Good    OT Frequency  2x / week    OT Duration  8 weeks    OT Treatment/Interventions  Self-care/ADL training;Moist Heat;DME and/or AE instruction;Splinting;Patient/family education;Fluidtherapy;Therapeutic exercises;Therapeutic activities;Balance training;Therapeutic exercise;Neuromuscular education;Functional Mobility Training;Passive range of motion;Manual Therapy;Energy conservation;Parrafin;Cryotherapy;Aquatic Therapy    Plan  coordination HEP, continue ADL strategies    Consulted and Agree with Plan of Care  Patient        Patient will benefit from skilled therapeutic intervention in order to improve the following deficits and impairments:  Decreased knowledge of use of DME, Decreased mobility, Decreased strength, Impaired UE functional use, Decreased balance, Decreased activity tolerance, Impaired tone, Improper spinal/pelvic alignment, Decreased endurance, Decreased coordination, Decreased range of motion  Visit Diagnosis: Other symptoms and signs involving the nervous system  Other symptoms and signs involving the musculoskeletal system  Other lack of coordination    Problem List Patient Active Problem List   Diagnosis Date Noted  . Dyspnea on exertion 08/05/2017  . Hyperlipidemia 04/23/2016  . Arthritis 04/23/2016  . 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 Postoperative anemia due to acute blood loss 08/29/2015  . 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 Neuromuscular disease ([place]) 09/26/2014  . Gait difficulty 12/29/2013  . Back pain 12/29/2013  . Paresthesia 12/19/2012  . Essential hypertension 11/28/2012  . Polypharmacy 11/28/2012  . GERD (gastroesophageal reflux disease) 11/28/2012    RINE,KATHRYN 09/02/2017, 3:58 PM  [facility] [address] [place], [name], [address] Phone: [phone]   Fax:  [phone]  Name: Velena Keegan MRN: [id number] Date of Birth: 01-Jul-1956

## 2017-09-02 NOTE — Patient Instructions (Signed)
   1)  Push-ups from counter:  Standing at counter with feet wide apart-push into counter, then push away  -straighten your arms as you push away (but keep your hands supported at the counter)  -Repeat 5-10 times  2)  Standing at counter with hands supported at counter-rock side to side  -Weightshift side to side through your hips, 5-10 times

## 2017-09-03 ENCOUNTER — Ambulatory Visit (HOSPITAL_COMMUNITY)
Admission: RE | Admit: 2017-09-03 | Discharge: 2017-09-03 | Disposition: A | Discharge status: Home / Self Care | Payer: Medicare Other | Source: Ambulatory Visit | Attending: Internal Medicine | Admitting: Internal Medicine

## 2017-09-03 DIAGNOSIS — R0600 Dyspnea, unspecified: Secondary | ICD-10-CM

## 2017-09-03 DIAGNOSIS — R0609 Other forms of dyspnea: Secondary | ICD-10-CM | POA: Insufficient documentation

## 2017-09-03 LAB — PULMONARY FUNCTION TEST
DL/VA % PRED: 95 %
DL/VA: 4.99 ml/min/mmHg/L
DLCO unc % pred: 64 %
DLCO unc: 19.25 ml/min/mmHg
FEF 25-75 POST: 2.89 L/s
FEF 25-75 Pre: 2.9 L/sec
FEF2575-%CHANGE-POST: 0 %
FEF2575-%PRED-POST: 114 %
FEF2575-%Pred-Pre: 115 %
FEV1-%CHANGE-POST: 2 %
FEV1-%PRED-PRE: 83 %
FEV1-%Pred-Post: 85 %
FEV1-PRE: 2.42 L
FEV1-Post: 2.47 L
FEV1FVC-%Change-Post: 7 %
FEV1FVC-%PRED-PRE: 104 %
FEV6-%Change-Post: -4 %
FEV6-%Pred-Post: 78 %
FEV6-%Pred-Pre: 81 %
FEV6-PRE: 2.97 L
FEV6-Post: 2.84 L
FEV6FVC-%Change-Post: 0 %
FEV6FVC-%PRED-PRE: 103 %
FEV6FVC-%Pred-Post: 104 %
FVC-%CHANGE-POST: -4 %
FVC-%PRED-POST: 75 %
FVC-%PRED-PRE: 79 %
FVC-POST: 2.84 L
FVC-PRE: 2.98 L
POST FEV1/FVC RATIO: 87 %
PRE FEV6/FVC RATIO: 100 %
Post FEV6/FVC ratio: 100 %
Pre FEV1/FVC ratio: 81 %
RV % PRED: 90 %
RV: 2.02 L
TLC % pred: 87 %
TLC: 4.94 L

## 2017-09-03 MED ORDER — ALBUTEROL SULFATE (2.5 MG/3ML) 0.083% IN NEBU
2.5000 mg | INHALATION_SOLUTION | Freq: Once | RESPIRATORY_TRACT | Status: AC
  Administered 2017-09-03: 2.5 mg via RESPIRATORY_TRACT

## 2017-09-03 NOTE — Therapy (Signed)
[facility] [address] [place], [name], [address] Phone: [phone]   Fax:  [phone]  Physical Therapy Treatment  Patient Details  Name: Jacqueline Atkinson MRN: [id number] Date of Birth: 04/29/1956 Referring Provider: Wells Guiles Tat   Encounter Date: 09/02/2017  PT End of Session - 09/03/17 0835    Visit Number  4    Number of Visits  17    Date for PT Re-Evaluation  10/17/17    Authorization Type  [employer]    PT Start Time  1318    PT Stop Time  1400    PT Time Calculation (min)  42 min    Equipment Utilized During Treatment  Gait belt    Activity Tolerance  Patient tolerated treatment well    Behavior During Therapy  [facility] for tasks assessed/performed       Past Medical History:  Diagnosis Date  . Allergy   . Anemia   . Anxiety   . Arthritis    Left knee  . Edema extremities    lower extremity edema left greater than right.  Marland Kitchen GERD (gastroesophageal reflux disease)   . Heart murmur   . History of hiatal hernia    small  . Hx of seasonal allergies    occ. use OTC Mucinex or Antihistamine.  . Hyperlipidemia   . Hypertension   . Mobility impaired    ambulates with walker, tendency to lose balance.  . 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 Neuromuscular disorder ([place])    "multi system atrophy disease"- "pain, bilateral foot numbness, left knee pain"- Dr. Carles Collet is following.  . 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 ESOPHAGOGASTRODUODENOSCOPY ENDOSCOPY     with esophageal dilation  . FOOT SURGERY  2011   3rd metatarsal LT foot  . TONSILLECTOMY AND ADENOIDECTOMY      There were no vitals filed for this visit.  Subjective Assessment - 09/02/17 1312    Subjective  Having a "tippy" day today.  Feel like I  may lose balance backwards.  Still trying to work on the [employer].    Pertinent History  chronic back pain, L knee replacement 08/23/15 (median ligament crosses over the patella with knee extension-limits WB flexion>ext with standing or riding bike), reflux.    How long can you stand comfortably?  Cannot stand >10-15 minutes before too much hip pain    Patient Stated Goals  Want to be able to stand longer, work on safety with walker, getting in and out of van    Currently in Pain?  No/denies                      [facility] Adult PT Treatment/Exercise - 09/03/17 0001      Transfers   Transfers  Sit to Stand;Stand to Sit;Floor to Transfer    Sit to Stand  6: Modified independent (Device/Increase time)    Stand to Sit  5: Supervision    Floor to Transfer  4: Min assist;With upper extremity assist    Floor to Transfer Details (indicate cue type and reason)  From mat table, pt puts hands on table and uses RLE to lower down to floor (red mat surface)>side sit on RLE>then repositions into supine for stretches.  For  floor>stand, practiced x 2 reps.  First rep, pt uses yoga block under L knee in tall kneeling, then pushes from forearms to bring both legs up to stand, then she pivots around to sit.  Second trial, used elevate mat surface more like the height of blanket chest at her home, and pt able to push up through hands (versus forearms) to come to standing.  Pt reports improved ease of transfer at higher mat surface.     Number of Reps  -- 2 reps        High Level Balance   High Level Balance Comments  At counter, attempted push-up into standing PwR! Up position, but pt too unsteady without UE support-performed counter push-ups instead, x 10 reps for UE strengthening for improved carryover for assistance with floor>stand transfers; widened BOS lateral weightshifting x 10 reps, with UE support at counter      Exercises   Exercises  Lumbar;Knee/Hip      Lumbar Exercises: Stretches    Single Knee to Chest Stretch  3 reps;30 seconds    Double Knee to Chest Stretch  1 rep;10 seconds    Lower Trunk Rotation  3 reps;30 seconds    Piriformis Stretch  3 reps;30 seconds        [facility]) - 09/02/17 1341    PWR! exercises  Moves in supine;Moves in prone    PWR! Up  x 8    PWR! Rock  x 8 each side    PWR! Twist  x 8 each side    Comments  In supine:  PWR! Up through shoulders, then PWR! Up through hips, 8 reps each    PWR! Twist in sidelying (prone) position  x 5 reps each side-cues for correct positioning          PT Education - 09/03/17 0834    Education provided  Yes    Education Details  Practice with floor transfers using mat/yoga block/blanket chest for more consistent performance of stretching exercises on floor, when someone home to help if needed; additions to HEP-standing at counter    Person(s) Educated  Patient    Methods  Explanation;Demonstration;Handout;Verbal cues    Comprehension  Verbalized understanding;Returned demonstration;Verbal cues required       PT Short Term Goals - 08/26/17 2155      PT SHORT TERM GOAL #1   Title  Pt will be independent with HEP for improved transfers, gait and balance.  TARGET 09/19/17    Time  4    Period  Weeks    Status  New      PT SHORT TERM GOAL #2   Title  Pt will improve TUG score to less than or equal to 18 seconds for decreased fall risk.    Time  4    Period  Weeks    Status  New      PT SHORT TERM GOAL #3   Title  Pt will improve gait velocity to at least 1.8 ft/sec for improved gait efficiency and safety.    Time  4    Period  Weeks    Status  New      PT SHORT TERM GOAL #4   Title  Pt will verbalize understanding of fall prevention in the home environment.    Time  4    Period  Weeks    Status  New      PT SHORT TERM GOAL #5   Title  Patient able to transfer to  floor and up off the floor with min assist.    Time  4    Period  Weeks    Status  New    Target Date  09/19/17        PT  Long Term Goals - 08/18/17 1126      PT LONG TERM GOAL #1   Title  Pt will be independent with progression of HEP to address posture, balance, transfers and gait.  TARGET 10/17/17    Time  8    Period  Weeks    Status  New    Target Date  10/17/17      PT LONG TERM GOAL #2   Title  Pt will improve TUG cognitive score to less than or equal to 18 seconds for decreased fall risk.    Time  8    Period  Weeks    Status  New    Target Date  10/17/17      PT LONG TERM GOAL #3   Title  Pt will improve gait velocity to at least 2 ft/sec for improved gait efficiency and safety.    Time  8    Period  Weeks    Status  New    Target Date  10/17/17      PT LONG TERM GOAL #4   Title  Pt will improve 3 minute walk test by 50 ft for improved efficiency and safety with gait.    Baseline  347 ft in 3 minutes at eval    Time  8    Period  Weeks    Status  New    Target Date  10/17/17      PT LONG TERM GOAL #5   Title  Pt will verbalize plans for ongoing community fitness upon D/C from PT.    Time  8    Period  Weeks    Status  New    Target Date  10/17/17            Plan - 09/03/17 0835    Clinical Impression Statement  Pt reports she is continuing to follow-up on U-step walker; worked today on floor<>stand transfers and stretching on floor mat, with pt improving ease of transfer with pushing up from hands on higher surface.  Worked also on several standing exercises to address UE strength and standing weightshifting to avoid fatigue in standing.    Rehab Potential  Good    Clinical Impairments Affecting Rehab Potential  Limitations in L knee (per pt report:  median ligament crosses patella when going flexion>extension and is very painful; pt has adapted, but is limited in weightbearing flex>ext movement patterns)    PT Frequency  2x / week    PT Duration  8 weeks    PT Treatment/Interventions  ADLs/Self Care Home Management;Gait training;DME Instruction;Functional mobility  training;Therapeutic activities;Therapeutic exercise;Balance training;Neuromuscular re-education;Patient/family education    PT Next Visit Plan  Review floor>stand transfers, HEP; continue modified quadruped PWR! Moves at counter; gait training with U-step RW    Consulted and Agree with Plan of Care  Patient       Patient will benefit from skilled therapeutic intervention in order to improve the following deficits and impairments:  Abnormal gait, Decreased balance, Decreased activity tolerance, Decreased mobility, Decreased strength, Difficulty walking, Postural dysfunction  Visit Diagnosis: Other symptoms and signs involving the nervous system  Abnormal posture  Unsteadiness on feet     Problem List Patient Active Problem List   Diagnosis  Date Noted  . Dyspnea on exertion 08/05/2017  . Hyperlipidemia 04/23/2016  . Arthritis 04/23/2016  . 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 Postoperative anemia due to acute blood loss 08/29/2015  . 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 Neuromuscular disease ([place]) 09/26/2014  . Gait difficulty 12/29/2013  . Back pain 12/29/2013  . Paresthesia 12/19/2012  . Essential hypertension 11/28/2012  . Polypharmacy 11/28/2012  . GERD (gastroesophageal reflux disease) 11/28/2012    Jerzey Komperda W. 09/03/2017, 8:39 AM Frazier Butt., PT  [facility] [address] [place], [name], [address] Phone: [phone]   Fax:  [phone]  Name: Jacqueline Atkinson MRN: [id number] Date of Birth: Jun 03, 1956

## 2017-09-04 ENCOUNTER — Telehealth: Payer: Self-pay | Admitting: Neurology

## 2017-09-04 NOTE — Telephone Encounter (Signed)
LMOM letting patient know I would work on authorization and get back to her once it's completed.

## 2017-09-04 NOTE — Telephone Encounter (Signed)
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  Dr. Carles Collet Juluis Rainier.

## 2017-09-04 NOTE — Telephone Encounter (Signed)
Pt called and said she does want to start the Botox treatments

## 2017-09-04 NOTE — Telephone Encounter (Signed)
Get a pre-auth

## 2017-09-05 ENCOUNTER — Encounter: Payer: Self-pay | Admitting: Physical Therapy

## 2017-09-05 ENCOUNTER — Ambulatory Visit: Payer: Medicare Other | Admitting: Physical Therapy

## 2017-09-05 ENCOUNTER — Other Ambulatory Visit: Payer: Self-pay | Admitting: Family Medicine

## 2017-09-05 ENCOUNTER — Ambulatory Visit: Payer: Medicare Other | Admitting: Occupational Therapy

## 2017-09-05 VITALS — BP 157/82 | HR 66

## 2017-09-05 DIAGNOSIS — R29818 Other symptoms and signs involving the nervous system: Secondary | ICD-10-CM

## 2017-09-05 DIAGNOSIS — R293 Abnormal posture: Secondary | ICD-10-CM

## 2017-09-05 DIAGNOSIS — R2681 Unsteadiness on feet: Secondary | ICD-10-CM

## 2017-09-05 DIAGNOSIS — R2689 Other abnormalities of gait and mobility: Secondary | ICD-10-CM | POA: Diagnosis not present

## 2017-09-05 DIAGNOSIS — R278 Other lack of coordination: Secondary | ICD-10-CM

## 2017-09-05 DIAGNOSIS — R29898 Other symptoms and signs involving the musculoskeletal system: Secondary | ICD-10-CM

## 2017-09-05 NOTE — Therapy (Signed)
[facility] [address] [place], [name], [address] Phone: [phone]   Fax:  [phone]  Occupational Therapy Treatment  Patient Details  Name: Jacqueline Atkinson MRN: [id number] Date of Birth: 18-Mar-1956 Referring Provider: Dr. Wells Guiles Tat    Encounter Date: 09/05/2017  OT End of Session - 09/05/17 1032    Visit Number  5    Number of Visits  17    Date for OT Re-Evaluation  10/17/17    Authorization Type  [employer], no visit limit/no auth; G-code needed    Authorization - Visit Number  5    Authorization - Number of Visits  10    OT Start Time  1020    OT Stop Time  1100    OT Time Calculation (min)  40 min    Activity Tolerance  Patient tolerated treatment well    Behavior During Therapy  [facility] for tasks assessed/performed       Past Medical History:  Diagnosis Date  . Allergy   . Anemia   . Anxiety   . Arthritis    Left knee  . Edema extremities    lower extremity edema left greater than right.  Marland Kitchen GERD (gastroesophageal reflux disease)   . Heart murmur   . History of hiatal hernia    small  . Hx of seasonal allergies    occ. use OTC Mucinex or Antihistamine.  . Hyperlipidemia   . Hypertension   . Mobility impaired    ambulates with walker, tendency to lose balance.  . 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 Neuromuscular disorder ([place])    "multi system atrophy disease"- "pain, bilateral foot numbness, left knee pain"- Dr. Carles Collet is following.  . 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 ESOPHAGOGASTRODUODENOSCOPY ENDOSCOPY     with esophageal dilation  . FOOT SURGERY  2011   3rd metatarsal LT foot  . TONSILLECTOMY AND ADENOIDECTOMY      There were no vitals filed for this visit.  Subjective Assessment -  09/05/17 1031    Pertinent History  Multiple Systems Atrophy (MSA); L knee replacement 2016; HTN; migraines; dyspnea on exertion; hyperlipidemia    Limitations  **check BP; fall risk    Currently in Pain?  Yes    Pain Score  5     Pain Location  Head    Pain Descriptors / Indicators  Aching    Pain Type  Chronic pain    Pain Onset  More than a month ago    Pain Frequency  Constant    Aggravating Factors   bending over    Pain Relieving Factors  meds, caffeine    Multiple Pain Sites  No                 Treatment:Arm bike x 6 mins level 1 for conditioning, pt maintained 35-38 rpm          OT Education - 09/05/17 1033    Education provided  Yes    Education Details  PWR! seated up, and rock, PWR! hands up, twist, coordination HEP    Person(s) Educated  Patient    Methods  Explanation;Demonstration;Handout    Comprehension  Verbalized understanding;Returned demonstration       OT Short Term Goals - 08/26/17  [place] #1   Title  Pt will be independent with updated HEP.--check STGs 09/17/17    Time  4    Period  Weeks    Status  New      OT SHORT TERM GOAL #2   Title  Pt will improve RUE coordination/functional reaching as shown by improving score on box and blocks test by at least 4 blocks.    Baseline  R-42    Time  4    Period  Weeks    Status  New      OT SHORT TERM GOAL #3   Title  Pt will report incr ease with LB dressing using strategies/AE prn.    Time  4    Period  Weeks    Status  New      OT SHORT TERM GOAL #4   Title  Pt will verbalize understanding of appropriate community resources and ways to prevent future complications.    Time  4    Period  Weeks    Status  New      OT SHORT TERM GOAL #5   Title  Pt will verbalize understanding of compensatory strategies to minimize hand cramps, fatigue and maintain legibility of handwriting.    Baseline  hands cramp and fatigue quicly, pt demonstrates good legibility of  handwriting but she writes very quicly at times    Time  4    Period  Weeks    Status  New        OT Long Term Goals - 08/18/17 1257      OT LONG TERM GOAL #1   Title  Pt will verbalize understanding of adaptive strategies/AE for ADLs/IADLs (including strategies for putting on make-up, donning/doffing bathing suit and leggings, eating, writing, painting).--check LTGs 10/17/17    Time  8    Period  Weeks    Status  New      OT LONG TERM GOAL #2   Title  Pt will improve RUE coordination/functional reaching as shown by improving score on box and blocks test by at least 8 blocks.    Baseline  R-42 blocks    Time  8    Period  Weeks    Status  New      OT LONG TERM GOAL #3   Title  Pt will improve coordination for ADLs as shown by improving time on 9-hole peg test by at least 5sec with RUE.    Baseline  R-37.71sec, L-31.34sec    Time  8    Period  Weeks    Status  New      OT LONG TERM GOAL #4   Title  Pt will report incr ease with writing/painting.    Time  8    Period  Weeks    Status  New      OT LONG TERM GOAL #5   Title  Pt will report using RUE as dominant UE for eating at least 90% of the time.    Time  8    Period  Weeks    Status  New      Long Term Additional Goals   Additional Long Term Goals  --            Plan - 09/05/17 1035    Clinical Impression Statement  Pt is progressing towards goals for HEP.    Rehab Potential  Good    OT Frequency  2x / week    OT Duration  8 weeks    OT Treatment/Interventions  Self-care/ADL training;Moist Heat;DME and/or AE instruction;Splinting;Patient/family education;Fluidtherapy;Therapeutic exercises;Therapeutic activities;Balance training;Therapeutic exercise;Neuromuscular education;Functional Mobility Training;Passive range of motion;Manual Therapy;Energy conservation;Parrafin;Cryotherapy;Aquatic Therapy    Plan  continue ADL strategies, check to see how coordination HEP is going    OT Home Exercise Plan  issued PWR!  hands, PWR! supine up, rock and twist, tripod grip for pen, cordination HEP    Consulted and Agree with Plan of Care  Patient       Patient will benefit from skilled therapeutic intervention in order to improve the following deficits and impairments:  Decreased knowledge of use of DME, Decreased mobility, Decreased strength, Impaired UE functional use, Decreased balance, Decreased activity tolerance, Impaired tone, Improper spinal/pelvic alignment, Decreased endurance, Decreased coordination, Decreased range of motion  Visit Diagnosis: Other symptoms and signs involving the nervous system  Other symptoms and signs involving the musculoskeletal system  Other lack of coordination  Abnormal posture    Problem List Patient Active Problem List   Diagnosis Date Noted  . Dyspnea on exertion 08/05/2017  . Hyperlipidemia 04/23/2016  . Arthritis 04/23/2016  . 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 Postoperative anemia due to acute blood loss 08/29/2015  . 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 Neuromuscular disease ([place]) 09/26/2014  . Gait difficulty 12/29/2013  . Back pain 12/29/2013  . Paresthesia 12/19/2012  . Essential hypertension 11/28/2012  . Polypharmacy 11/28/2012  . GERD (gastroesophageal reflux disease) 11/28/2012   Theone Murdoch, OTR/L [phone] Phone: [phone] 12:55 PM 09/05/17 Jacqueline Atkinson 09/05/2017, 10:40 AM .[facility] [address] [place], [name], [address] Phone: [phone]   Fax:  [phone]  Name: Jacqueline Atkinson MRN: [id number] Date of Birth: February 06, 1956

## 2017-09-05 NOTE — Patient Instructions (Signed)
Coordination Exercises  Perform the following exercises for 20 minutes 1 times per day. Perform with both hand(s). Perform using big movements.   Flipping Cards: Place deck of cards on the table. Flip cards over by opening your hand big to grasp and then turn your palm up big.  Deal cards: Hold 1/2 or whole deck in your hand. Use thumb to push card off top of deck with one big push.  Flip card between each finger.  Rotate ball with fingertips: Pick up with fingers/thumb and move as much as you can with each turn/movement (clockwise and counter-clockwise).  Toss ball from one hand to the other: Toss big/high.  Toss ball in the air and catch with the same hand: Toss big/high.  Pick up coins and stack one at a time: Pick up with big, intentional movements. Do not drag coin to the edge. (5-10 in a stack)  Pick up 5-10 coins one at a time and hold in palm. Then, move coins from palm to fingertips one at time and place in coin bank/container.  Practice writing: Slow down, write big, and focus on forming each letter.  Perform "Flicks"/hand stretches (PWR! Hands): Close hands then flick out your fingers with focus on opening hands, pulling wrists back, and extending elbows like you are pushing.

## 2017-09-05 NOTE — Therapy (Signed)
[facility] [address] [place], [name], [address] Phone: [phone]   Fax:  [phone]  Physical Therapy Treatment  Patient Details  Name: Jacqueline Atkinson MRN: [id number] Date of Birth: 10-Feb-1956 Referring Provider: Wells Guiles Tat   Encounter Date: 09/05/2017  PT End of Session - 09/05/17 1339    Visit Number  5    Number of Visits  17    Date for PT Re-Evaluation  10/17/17    Authorization Type  [employer]    PT Start Time  [id number]    PT Stop Time  1015    PT Time Calculation (min)  39 min    Activity Tolerance  Patient tolerated treatment well    Behavior During Therapy  [facility] for tasks assessed/performed       Past Medical History:  Diagnosis Date  . Allergy   . Anemia   . Anxiety   . Arthritis    Left knee  . Edema extremities    lower extremity edema left greater than right.  Marland Kitchen GERD (gastroesophageal reflux disease)   . Heart murmur   . History of hiatal hernia    small  . Hx of seasonal allergies    occ. use OTC Mucinex or Antihistamine.  . Hyperlipidemia   . Hypertension   . Mobility impaired    ambulates with walker, tendency to lose balance.  . 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 Neuromuscular disorder ([place])    "multi system atrophy disease"- "pain, bilateral foot numbness, left knee pain"- Dr. Carles Collet is following.  . 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 ESOPHAGOGASTRODUODENOSCOPY ENDOSCOPY     with esophageal dilation  . FOOT SURGERY  2011   3rd metatarsal LT foot  . TONSILLECTOMY AND ADENOIDECTOMY      Vitals:   09/05/17 0941  BP: (!) 157/82  Pulse: 66    Subjective Assessment - 09/05/17 0941    Subjective  Having a rough morning-following a rough night-had high blood pressure in the  night and a headache.  On the plus side-I've been getting up and down from the floor. (from TV console and arm of couch to push up)    Pertinent History  chronic back pain, L knee replacement 08/23/15 (median ligament crosses over the patella with knee extension-limits WB flexion>ext with standing or riding bike), reflux.    How long can you stand comfortably?  Cannot stand >10-15 minutes before too much hip pain    Patient Stated Goals  Want to be able to stand longer, work on safety with walker, getting in and out of van    Currently in Pain?  Yes    Pain Score  6     Pain Location  Head    Pain Orientation  Right;Left    Pain Descriptors / Indicators  Aching    Pain Type  Chronic pain    Pain Onset  More than a month ago    Pain Frequency  Constant    Aggravating Factors   bending over "makes my head feel like it will explode"    Pain Relieving Factors  Medication, caffeine                      [facility] Adult PT Treatment/Exercise -  09/05/17 0001      Transfers   Transfers  Sit to Stand;Stand to Sit    Number of Reps  -- 3 reps throughout session    Comments  Pt reports performing floor>stand transfers at home      Ambulation/Gait   Ambulation/Gait  Yes    Ambulation/Gait Assistance  5: Supervision    Ambulation Distance (Feet)  350 Feet U-Step RW; 60 ft RW    Assistive device  Other (Comment) U-STEP RW    Gait Pattern  Step-through pattern;Trunk flexed;Poor foot clearance - right;Decreased stride length more upright posture with U-step RW    Ambulation Surface  Level;Indoor    Gait velocity  21.87 sec with U-step RW= 1.5 ft/sec    Pre-Gait Activities  At counter, then with U-step RW several reps of forward/back walking:  cues for backward walking to "hinge" at hips and take single steps, bring walker back to her for safety with walking backwards in home.    Gait Comments  Pt with more upright posture, improved ability to stay within BOS of walker.      High Level  Balance   High Level Balance Comments  Reviewed push-ups x 10 reps, then lateral weightshifting x 10 reps; anterior/posterior weigthshifting with wide BOS x 10 reps       Pt able to verbalize safe sequence of floor>stand transfer at home since last visit.      PT Education - 09/05/17 1338    Education provided  Yes    Education Details  Backwards walking at counter    Methods  Explanation;Demonstration;Handout    Comprehension  Verbalized understanding;Returned demonstration       PT Short Term Goals - 08/26/17 2155      PT SHORT TERM GOAL #1   Title  Pt will be independent with HEP for improved transfers, gait and balance.  TARGET 09/19/17    Time  4    Period  Weeks    Status  New      PT SHORT TERM GOAL #2   Title  Pt will improve TUG score to less than or equal to 18 seconds for decreased fall risk.    Time  4    Period  Weeks    Status  New      PT SHORT TERM GOAL #3   Title  Pt will improve gait velocity to at least 1.8 ft/sec for improved gait efficiency and safety.    Time  4    Period  Weeks    Status  New      PT SHORT TERM GOAL #4   Title  Pt will verbalize understanding of fall prevention in the home environment.    Time  4    Period  Weeks    Status  New      PT SHORT TERM GOAL #5   Title  Patient able to transfer to floor and up off the floor with min assist.    Time  4    Period  Weeks    Status  New    Target Date  09/19/17        PT Long Term Goals - 08/18/17 1126      PT LONG TERM GOAL #1   Title  Pt will be independent with progression of HEP to address posture, balance, transfers and gait.  TARGET 10/17/17    Time  8    Period  Weeks    Status  New  Target Date  10/17/17      PT LONG TERM GOAL #2   Title  Pt will improve TUG cognitive score to less than or equal to 18 seconds for decreased fall risk.    Time  8    Period  Weeks    Status  New    Target Date  10/17/17      PT LONG TERM GOAL #3   Title  Pt will improve gait  velocity to at least 2 ft/sec for improved gait efficiency and safety.    Time  8    Period  Weeks    Status  New    Target Date  10/17/17      PT LONG TERM GOAL #4   Title  Pt will improve 3 minute walk test by 50 ft for improved efficiency and safety with gait.    Baseline  347 ft in 3 minutes at eval    Time  8    Period  Weeks    Status  New    Target Date  10/17/17      PT LONG TERM GOAL #5   Title  Pt will verbalize plans for ongoing community fitness upon D/C from PT.    Time  8    Period  Weeks    Status  New    Target Date  10/17/17            Plan - 09/05/17 1339    Clinical Impression Statement  Pt reports improvement with floor to stand transfers at home.  Per pt request, address backward walking and transitions forward/back to prevent LOB in posterior direction in tight spaces at home.  Pt performs safely when slowly taking one step at a time backwards with "hinged" hip position and seems pleased with this advice.  Pt will continue to benefit from skilled PT to address functional mobility.    Rehab Potential  Good    Clinical Impairments Affecting Rehab Potential  Limitations in L knee (per pt report:  median ligament crosses patella when going flexion>extension and is very painful; pt has adapted, but is limited in weightbearing flex>ext movement patterns)    PT Frequency  2x / week    PT Duration  8 weeks    PT Treatment/Interventions  ADLs/Self Care Home Management;Gait training;DME Instruction;Functional mobility training;Therapeutic activities;Therapeutic exercise;Balance training;Neuromuscular re-education;Patient/family education    PT Next Visit Plan  Review floor>stand transfers as needed, HEP; continue modified quadruped PWR! Moves at counter; gait training with U-step RW    Consulted and Agree with Plan of Care  Patient       Patient will benefit from skilled therapeutic intervention in order to improve the following deficits and impairments:  Abnormal  gait, Decreased balance, Decreased activity tolerance, Decreased mobility, Decreased strength, Difficulty walking, Postural dysfunction  Visit Diagnosis: Other symptoms and signs involving the nervous system  Unsteadiness on feet  Other abnormalities of gait and mobility     Problem List Patient Active Problem List   Diagnosis Date Noted  . Dyspnea on exertion 08/05/2017  . Hyperlipidemia 04/23/2016  . Arthritis 04/23/2016  . 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 Postoperative anemia due to acute blood loss 08/29/2015  . 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 Neuromuscular disease ([place]) 09/26/2014  . Gait difficulty 12/29/2013  . Back pain 12/29/2013  . Paresthesia 12/19/2012  . Essential hypertension 11/28/2012  . Polypharmacy 11/28/2012  . GERD (gastroesophageal reflux disease) 11/28/2012  MARRIOTT,AMY W. 09/05/2017, 1:43 PM Frazier Butt., PT [facility] [address] [place], [name], [address] Phone: [phone]   Fax:  [phone]  Name: Skylar Flynt MRN: [id number] Date of Birth: 1956-04-04

## 2017-09-08 NOTE — Progress Notes (Signed)
Spoke with pt and notified of results per Dr. Melvyn Novas. Pt verbalized understanding and denied any questions. She states she prefers to call for 6 month f/u. She did not want to schedule appt at this time

## 2017-09-09 NOTE — Telephone Encounter (Signed)
Approval received. Appt made with patient.

## 2017-09-10 ENCOUNTER — Ambulatory Visit: Payer: Medicare Other | Admitting: Occupational Therapy

## 2017-09-10 ENCOUNTER — Encounter: Payer: Self-pay | Admitting: Physical Therapy

## 2017-09-10 ENCOUNTER — Ambulatory Visit: Payer: Medicare Other | Admitting: Physical Therapy

## 2017-09-10 DIAGNOSIS — R2689 Other abnormalities of gait and mobility: Secondary | ICD-10-CM | POA: Diagnosis not present

## 2017-09-10 DIAGNOSIS — R29898 Other symptoms and signs involving the musculoskeletal system: Secondary | ICD-10-CM

## 2017-09-10 DIAGNOSIS — R278 Other lack of coordination: Secondary | ICD-10-CM

## 2017-09-10 DIAGNOSIS — R29818 Other symptoms and signs involving the nervous system: Secondary | ICD-10-CM | POA: Diagnosis not present

## 2017-09-10 DIAGNOSIS — R2681 Unsteadiness on feet: Secondary | ICD-10-CM

## 2017-09-10 DIAGNOSIS — R293 Abnormal posture: Secondary | ICD-10-CM | POA: Diagnosis not present

## 2017-09-10 NOTE — Therapy (Signed)
[facility] [address] [place], [name], [address] Phone: [phone]   Fax:  [phone]  Physical Therapy Treatment  Patient Details  Name: Jacqueline Atkinson MRN: [id number] Date of Birth: 1956-07-18 Referring Provider: Wells Guiles Tat   Encounter Date: 09/10/2017  PT End of Session - 09/10/17 1048    Visit Number  6    Number of Visits  17    Date for PT Re-Evaluation  10/17/17    Authorization Type  [employer]    PT Start Time  0933    PT Stop Time  1015    PT Time Calculation (min)  42 min    Equipment Utilized During Treatment  Gait belt    Activity Tolerance  Patient tolerated treatment well    Behavior During Therapy  [facility] for tasks assessed/performed       Past Medical History:  Diagnosis Date  . Allergy   . Anemia   . Anxiety   . Arthritis    Left knee  . Edema extremities    lower extremity edema left greater than right.  Marland Kitchen GERD (gastroesophageal reflux disease)   . Heart murmur   . History of hiatal hernia    small  . Hx of seasonal allergies    occ. use OTC Mucinex or Antihistamine.  . Hyperlipidemia   . Hypertension   . Mobility impaired    ambulates with walker, tendency to lose balance.  . 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 Neuromuscular disorder ([place])    "multi system atrophy disease"- "pain, bilateral foot numbness, left knee pain"- Dr. Carles Collet is following.  . 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 ESOPHAGOGASTRODUODENOSCOPY ENDOSCOPY     with esophageal dilation  . FOOT SURGERY  2011   3rd metatarsal LT foot  . TONSILLECTOMY AND ADENOIDECTOMY      There were no vitals filed for this visit.  Subjective Assessment - 09/10/17 0935    Subjective  No falls.  Still waiting for U step walker  to come in.  Aggravated left wrist since last visit when rolling in bed; seeing the orthopedist tomorrow. OT recommends waiting to practice floor transfers till wrist feels better.    Currently in Pain?  Yes    Pain Score  4     Pain Location  Hip    Pain Orientation  Right;Left    Pain Descriptors / Indicators  Aching    Pain Type  Chronic pain    Pain Radiating Towards  towards back.    Pain Onset  More than a month ago    Pain Frequency  Constant    Aggravating Factors   standing    Pain Relieving Factors  sitting, walking                      [facility] Adult PT Treatment/Exercise - 09/10/17 0001      Transfers   Transfers  Sit to Stand;Stand to Sit;Stand Pivot Transfers    Sit to Stand  4: Min assist;4: Min guard Greater Assist due to pain in R wrist. gradually decreased A    Stand Pivot Transfers  4: Min guard worked on turning to sit<>stand from [employer].      Ambulation/Gait   Ambulation/Gait  Yes    Ambulation/Gait Assistance  4: Min guard    Ambulation/Gait Assistance Details  Practised activity tolerance, gait technique/safety using U- step walker, balance with head turns. Pt required intermittent short stops for to gain control. Reported that her LEs felt more "wobbly" today.                                             Ambulation Distance (Feet)  200 Feet x2 with seated rest break.    Assistive device  Other (Comment) U-step walker    Gait Pattern  Step-through pattern;Trunk flexed;Poor foot clearance - right;Decreased stride length          Balance Exercises - 09/10/17 1044      Balance Exercises: Standing   Retro Gait  1 rep practised functional stepping backawards with RW; min guard        PT Education - 09/10/17 1046    Education provided  Yes    Education Details  Benefits of resuming aquatics exercise is pt has appropriate support for safety.    Person(s) Educated  Patient    Methods  Explanation    Comprehension  Verbalized  understanding       PT Short Term Goals - 08/26/17 2155      PT SHORT TERM GOAL #1   Title  Pt will be independent with HEP for improved transfers, gait and balance.  TARGET 09/19/17    Time  4    Period  Weeks    Status  New      PT SHORT TERM GOAL #2   Title  Pt will improve TUG score to less than or equal to 18 seconds for decreased fall risk.    Time  4    Period  Weeks    Status  New      PT SHORT TERM GOAL #3   Title  Pt will improve gait velocity to at least 1.8 ft/sec for improved gait efficiency and safety.    Time  4    Period  Weeks    Status  New      PT SHORT TERM GOAL #4   Title  Pt will verbalize understanding of fall prevention in the home environment.    Time  4    Period  Weeks    Status  New      PT SHORT TERM GOAL #5   Title  Patient able to transfer to floor and up off the floor with min assist.    Time  4    Period  Weeks    Status  New    Target Date  09/19/17        PT Long Term Goals - 08/18/17 1126      PT LONG TERM GOAL #1   Title  Pt will be independent with progression of HEP to address posture, balance, transfers and gait.  TARGET 10/17/17    Time  8    Period  Weeks    Status  New    Target Date  10/17/17      PT LONG TERM GOAL #2   Title  Pt will improve TUG cognitive score to less than or equal to 18 seconds for decreased fall risk.    Time  8    Period  Weeks    Status  New    Target  Date  10/17/17      PT LONG TERM GOAL #3   Title  Pt will improve gait velocity to at least 2 ft/sec for improved gait efficiency and safety.    Time  8    Period  Weeks    Status  New    Target Date  10/17/17      PT LONG TERM GOAL #4   Title  Pt will improve 3 minute walk test by 50 ft for improved efficiency and safety with gait.    Baseline  347 ft in 3 minutes at eval    Time  8    Period  Weeks    Status  New    Target Date  10/17/17      PT LONG TERM GOAL #5   Title  Pt will verbalize plans for ongoing community fitness upon  D/C from PT.    Time  8    Period  Weeks    Status  New    Target Date  10/17/17            Plan - 09/10/17 1049    Clinical Impression Statement  Pt had limited activity tolerance today due to pain in right wrist. OT had given pt a wrist brace and recommended that pt be cautious not to aggravate wrist during weight bearing and to avoid florr transfers while her wrist hurts.  Gait trained with the U-step walker practising safe gait pattern, balance with head turns, and safety when transferring to sit<>stand from walker seat; pt required Min guard and seemed to be more hesitant with movement due to right wrist but reported that activity did not aggravate wrist.                                                                                 Rehab Potential  Good    Clinical Impairments Affecting Rehab Potential  Limitations in L knee (per pt report:  median ligament crosses patella when going flexion>extension and is very painful; pt has adapted, but is limited in weightbearing flex>ext movement patterns)    PT Frequency  2x / week    PT Duration  8 weeks    PT Treatment/Interventions  ADLs/Self Care Home Management;Gait training;DME Instruction;Functional mobility training;Therapeutic activities;Therapeutic exercise;Balance training;Neuromuscular re-education;Patient/family education    PT Next Visit Plan  Review floor>stand transfers as needed (if wrist is not painful), HEP; continue modified quadruped PWR! Moves at counter; gait training with U-step RW    Consulted and Agree with Plan of Care  Patient       Patient will benefit from skilled therapeutic intervention in order to improve the following deficits and impairments:  Abnormal gait, Decreased balance, Decreased activity tolerance, Decreased mobility, Decreased strength, Difficulty walking, Postural dysfunction  Visit Diagnosis: Other symptoms and signs involving the nervous system  Unsteadiness on feet  Other symptoms and  signs involving the musculoskeletal system     Problem List Patient Active Problem List   Diagnosis Date Noted  . Dyspnea on exertion 08/05/2017  . Hyperlipidemia 04/23/2016  . Arthritis 04/23/2016  . 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 Postoperative anemia due to acute blood loss  08/29/2015  . 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 Neuromuscular disease ([place]) 09/26/2014  . Gait difficulty 12/29/2013  . Back pain 12/29/2013  . Paresthesia 12/19/2012  . Essential hypertension 11/28/2012  . Polypharmacy 11/28/2012  . GERD (gastroesophageal reflux disease) 11/28/2012    Bjorn Loser, PTA  09/10/17, 10:57 AM [facility] [address] [place], [name], [address] Phone: [phone]   Fax:  [phone]  Name: Jacqueline Atkinson MRN: [id number] Date of Birth: [birth year]

## 2017-09-10 NOTE — Therapy (Signed)
[facility] [address] [place], [name], [address] Phone: [phone]   Fax:  [phone]  Occupational Therapy Treatment  Patient Details  Name: Jacqueline Atkinson MRN: [id number] Date of Birth: [birth year] Referring Provider: Dr. Wells Guiles Tat    Encounter Date: 09/10/2017  OT End of Session - 09/10/17 0916    Visit Number  6    Number of Visits  17    Date for OT Re-Evaluation  10/17/17    Authorization Type  [employer] Medicare, no visit limit/no auth; G-code needed    Authorization - Visit Number  6    Authorization - Number of Visits  10    OT Start Time  0850    OT Stop Time  0930    OT Time Calculation (min)  40 min    Activity Tolerance  Patient tolerated treatment well    Behavior During Therapy  [facility] for tasks assessed/performed       Past Medical History:  Diagnosis Date  . Allergy   . Anemia   . Anxiety   . Arthritis    Left knee  . Edema extremities    lower extremity edema left greater than right.  [place] GERD (gastroesophageal reflux disease)   . Heart murmur   . History of hiatal hernia    small  . Hx of seasonal allergies    occ. use OTC Mucinex or Antihistamine.  . Hyperlipidemia   . Hypertension   . Mobility impaired    ambulates with walker, tendency to lose balance.  . 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 Neuromuscular disorder ([place])    "multi system atrophy disease"- "pain, bilateral foot numbness, left knee pain"- Dr. Carles Collet is following.  . 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 ESOPHAGOGASTRODUODENOSCOPY ENDOSCOPY     with esophageal dilation  . FOOT SURGERY  2011   3rd metatarsal LT foot  . TONSILLECTOMY AND ADENOIDECTOMY      There were no vitals filed for this visit.  Subjective Assessment  - 09/10/17 0900    Pertinent History  Multiple Systems Atrophy (MSA); L knee replacement 2016; HTN; migraines; dyspnea on exertion; hyperlipidemia    Limitations  **check BP; fall risk    Currently in Pain?  Yes    Pain Score  4  to 8/10    Pain Location  Wrist    Pain Orientation  Right;Left    Pain Type  Chronic pain    Pain Onset  More than a month ago    Pain Frequency  Intermittent    Aggravating Factors   weightbearing     Pain Relieving Factors  not using it    Multiple Pain Sites  No                   Treatment: PWR! Up, rock and twist in supine 20 reps each for warm up Fluidotherapy x 10 mins to RUE for pain and stiffness,  With pt performing gentle A/ROM while in fluid, no adverse reactions.  Pt was issued a neoprene forearm thumb CMC brace, pt practiced application and returned demonstration. Pt was instructed to wear brace as needed for comfort.           OT Short Term Goals - 08/26/17 1555  OT SHORT TERM GOAL #1   Title  Pt will be independent with updated HEP.--check STGs 09/17/17    Time  4    Period  Weeks    Status  New      OT SHORT TERM GOAL #2   Title  Pt will improve RUE coordination/functional reaching as shown by improving score on box and blocks test by at least 4 blocks.    Baseline  R-42    Time  4    Period  Weeks    Status  New      OT SHORT TERM GOAL #3   Title  Pt will report incr ease with LB dressing using strategies/AE prn.    Time  4    Period  Weeks    Status  New      OT SHORT TERM GOAL #4   Title  Pt will verbalize understanding of appropriate community resources and ways to prevent future complications.    Time  4    Period  Weeks    Status  New      OT SHORT TERM GOAL #5   Title  Pt will verbalize understanding of compensatory strategies to minimize hand cramps, fatigue and maintain legibility of handwriting.    Baseline  hands cramp and fatigue quicly, pt demonstrates good legibility of handwriting but  she writes very quicly at times    Time  4    Period  Weeks    Status  New        OT Long Term Goals - 08/18/17 1257      OT LONG TERM GOAL #1   Title  Pt will verbalize understanding of adaptive strategies/AE for ADLs/IADLs (including strategies for putting on make-up, donning/doffing bathing suit and leggings, eating, writing, painting).--check LTGs 10/17/17    Time  8    Period  Weeks    Status  New      OT LONG TERM GOAL #2   Title  Pt will improve RUE coordination/functional reaching as shown by improving score on box and blocks test by at least 8 blocks.    Baseline  R-42 blocks    Time  8    Period  Weeks    Status  New      OT LONG TERM GOAL #3   Title  Pt will improve coordination for ADLs as shown by improving time on 9-hole peg test by at least 5sec with RUE.    Baseline  R-37.71sec, L-31.34sec    Time  8    Period  Weeks    Status  New      OT LONG TERM GOAL #4   Title  Pt will report incr ease with writing/painting.    Time  8    Period  Weeks    Status  New      OT LONG TERM GOAL #5   Title  Pt will report using RUE as dominant UE for eating at least 90% of the time.    Time  8    Period  Weeks    Status  New      Long Term Additional Goals   Additional Long Term Goals  --            Plan - 09/10/17 0917    Clinical Impression Statement  Pt is progressing towards goals. She reports wrist and thuumb pain in RUE after pushing off in bed.    Rehab Potential  Good  OT Frequency  2x / week    OT Duration  8 weeks    Plan  continue ADL stretegies, monitor pain    OT Home Exercise Plan  issued PWR! hands, PWR! supine up, rock and twist, tripod grip for pen, cordination HEP    Consulted and Agree with Plan of Care  Patient       Patient will benefit from skilled therapeutic intervention in order to improve the following deficits and impairments:  Decreased knowledge of use of DME, Decreased mobility, Decreased strength, Impaired UE functional  use, Decreased balance, Decreased activity tolerance, Impaired tone, Improper spinal/pelvic alignment, Decreased endurance, Decreased coordination, Decreased range of motion  Visit Diagnosis: Other symptoms and signs involving the nervous system  Other symptoms and signs involving the musculoskeletal system  Other lack of coordination    Problem List Patient Active Problem List   Diagnosis Date Noted  . Dyspnea on exertion 08/05/2017  . Hyperlipidemia 04/23/2016  . Arthritis 04/23/2016  . 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 Postoperative anemia due to acute blood loss 08/29/2015  . 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 Neuromuscular disease ([place]) 09/26/2014  . Gait difficulty 12/29/2013  . Back pain 12/29/2013  . Paresthesia 12/19/2012  . Essential hypertension 11/28/2012  . Polypharmacy 11/28/2012  . GERD (gastroesophageal reflux disease) 11/28/2012    RINE,KATHRYN 09/10/2017, 9:20 AM  [facility] [address] [place], [name], [address] Phone: [phone]   Fax:  [phone]  Name: Jacqueline Atkinson MRN: [id number] Date of Birth: 30-Mar-1956

## 2017-09-11 DIAGNOSIS — G8929 Other chronic pain: Secondary | ICD-10-CM | POA: Diagnosis not present

## 2017-09-11 DIAGNOSIS — M545 Low back pain: Secondary | ICD-10-CM | POA: Diagnosis not present

## 2017-09-12 ENCOUNTER — Ambulatory Visit: Payer: Medicare Other | Admitting: Physical Therapy

## 2017-09-12 ENCOUNTER — Ambulatory Visit: Payer: Medicare Other | Admitting: Occupational Therapy

## 2017-09-15 ENCOUNTER — Ambulatory Visit: Payer: Medicare Other | Admitting: Physical Therapy

## 2017-09-15 ENCOUNTER — Encounter: Payer: Medicare Other | Admitting: Occupational Therapy

## 2017-09-16 ENCOUNTER — Encounter: Payer: Medicare Other | Admitting: Occupational Therapy

## 2017-09-16 ENCOUNTER — Ambulatory Visit: Payer: Medicare Other | Admitting: Physical Therapy

## 2017-09-17 DIAGNOSIS — G8929 Other chronic pain: Secondary | ICD-10-CM | POA: Diagnosis not present

## 2017-09-17 DIAGNOSIS — M545 Low back pain: Secondary | ICD-10-CM | POA: Diagnosis not present

## 2017-09-19 ENCOUNTER — Other Ambulatory Visit: Payer: Self-pay | Admitting: Family Medicine

## 2017-09-23 ENCOUNTER — Telehealth: Payer: Self-pay | Admitting: Family Medicine

## 2017-09-23 ENCOUNTER — Ambulatory Visit: Payer: Medicare Other | Admitting: Physical Therapy

## 2017-09-23 ENCOUNTER — Encounter: Payer: Medicare Other | Admitting: Occupational Therapy

## 2017-09-23 DIAGNOSIS — M545 Low back pain: Secondary | ICD-10-CM | POA: Diagnosis not present

## 2017-09-23 DIAGNOSIS — G8929 Other chronic pain: Secondary | ICD-10-CM | POA: Diagnosis not present

## 2017-09-23 NOTE — Telephone Encounter (Signed)
Copied from [place] [id number]. Topic: Quick Communication - See Telephone Encounter >> Sep 23, 2017  4:23 PM Arletha Grippe wrote: CRM for notification. See Telephone encounter for:   09/23/17. Pt called with update on bp readings.  Mornings average 149/100, 136/94, 106/71,  Middle of the night - pretty good, she will check it occasionally  Pt is taking 2 clonidine at night and 1 during day.  Pt statesbp creeps up in afternoon  Afternoon - 158/98, 172/105, 148/98, 166/103 Pulse is in 60's on average  Cb number is [phone] Pt has stopped taking hydrolozine, she is not taking PRN, but she is taking 1 per day when bp is high.

## 2017-09-24 NOTE — Telephone Encounter (Signed)
See phone message - BP update

## 2017-09-25 MED ORDER — CLONIDINE HCL 0.2 MG PO TABS: 0.2000 mg | ORAL_TABLET | Freq: Two times a day (BID) | ORAL | 0 refills | Status: DC

## 2017-09-25 NOTE — Telephone Encounter (Signed)
Spoke with pt - gave message per Dr. Brigitte Pulse.  States she take 0.1mg  in am and 2 (two) of the 0.1mg  at night.   Advised the new med is 0.2mg  two (2) times per day.  She verbalized understanding.

## 2017-09-25 NOTE — Telephone Encounter (Signed)
Lets have her try clonidine 0.2mg  bid  - will send in a new rx from 0.2 bid. She should save the current 0.1mg  tabs that she has (don't use them all up before starting new rx) in case we need to change regimen or taper dose in the future.

## 2017-09-26 ENCOUNTER — Other Ambulatory Visit: Payer: Self-pay | Admitting: Orthopedic Surgery

## 2017-09-26 ENCOUNTER — Encounter: Payer: Self-pay | Admitting: Physical Therapy

## 2017-09-26 ENCOUNTER — Ambulatory Visit: Payer: Medicare Other | Admitting: Physical Therapy

## 2017-09-26 ENCOUNTER — Ambulatory Visit: Payer: Medicare Other | Admitting: Occupational Therapy

## 2017-09-26 VITALS — BP 120/76

## 2017-09-26 DIAGNOSIS — R29898 Other symptoms and signs involving the musculoskeletal system: Secondary | ICD-10-CM | POA: Diagnosis not present

## 2017-09-26 DIAGNOSIS — M545 Low back pain, unspecified: Secondary | ICD-10-CM

## 2017-09-26 DIAGNOSIS — R293 Abnormal posture: Secondary | ICD-10-CM | POA: Diagnosis not present

## 2017-09-26 DIAGNOSIS — R29818 Other symptoms and signs involving the nervous system: Secondary | ICD-10-CM

## 2017-09-26 DIAGNOSIS — R2689 Other abnormalities of gait and mobility: Secondary | ICD-10-CM | POA: Diagnosis not present

## 2017-09-26 DIAGNOSIS — R2681 Unsteadiness on feet: Secondary | ICD-10-CM

## 2017-09-26 DIAGNOSIS — R278 Other lack of coordination: Secondary | ICD-10-CM | POA: Diagnosis not present

## 2017-09-26 DIAGNOSIS — G8929 Other chronic pain: Secondary | ICD-10-CM

## 2017-09-26 NOTE — Therapy (Signed)
[facility] [address] [place], [place], [address] Phone: [phone]   Fax:  [phone]  Physical Therapy Treatment  Patient Details  Name: Jacqueline Atkinson MRN: [id number] Date of Birth: 09/13/1956 Referring Provider: Rebecca Tat   Encounter Date: 09/26/2017  PT End of Session - 09/26/17 2106    Visit Number  7    Number of Visits  17    Date for PT Re-Evaluation  10/17/17    Authorization Type  [employer]    PT Start Time  0934    PT Stop Time  1017    PT Time Calculation (min)  43 min    Equipment Utilized During Treatment  Gait belt    Activity Tolerance  Patient tolerated treatment well    Behavior During Therapy  WFL for tasks assessed/performed       Past Medical History:  Diagnosis Date  . Allergy   . Anemia   . Anxiety   . Arthritis    Left knee  . Edema extremities    lower extremity edema left greater than right.  . GERD (gastroesophageal reflux disease)   . Heart murmur   . History of hiatal hernia    small  . Hx of seasonal allergies    occ. use OTC Mucinex or Antihistamine.  . Hyperlipidemia   . Hypertension   . Mobility impaired    ambulates with walker, tendency to lose balance.  . Multiple system atrophy C ([facility])    a" progressive worsening disorder, Type C- affecting more motor issues"  . Neuromuscular disorder ([facility])    "multi system atrophy disease"- "pain, bilateral foot numbness, left knee pain"- Dr. Tat is following.  . 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 ESOPHAGOGASTRODUODENOSCOPY ENDOSCOPY     with esophageal dilation  . FOOT SURGERY  2011   3rd metatarsal LT foot  . KNEE ARTHROSCOPY Left 02/08/2015   Procedure: LEFT KNEE ARTHROSCOPY ;  Surgeon: Ronald Gioffre, MD;  Location: [facility];  Service: Orthopedics;  Laterality: Left;  . TONSILLECTOMY AND  ADENOIDECTOMY    . TOTAL KNEE ARTHROPLASTY Left 08/22/2015   Procedure: LEFT TOTAL  KNEE  ARTHROPLASTY;  Surgeon: Ronald Gioffre, MD;  Location: [facility];  Service: Orthopedics;  Laterality: Left;    Vitals:   09/26/17 0943  BP: 120/76    Subjective Assessment - 09/26/17 0943    Subjective  Not getting on the floor anymore due to Rt wrist hyperextension pain. Put her mat on top of her mattress again. Finds she has to do exercises in the morning by the pm she has exhausted her energy. Feels she does a pretty good job of taking rest breaks throughout the day.     Currently in Pain?  Yes    Pain Score  5     Pain Location  Hip    Pain Orientation  Right;Left    Pain Onset  More than a month ago                      [facility] Adult PT Treatment/Exercise - 09/26/17 0958      Transfers   Transfers  Sit to Stand;Stand to Sit    Sit to Stand  4: Min guard    Stand to Sit  4: Min guard      Ambulation/Gait     Ambulation/Gait  Yes    Ambulation/Gait Assistance  4: Min guard    Ambulation/Gait Assistance Details  Patient with flexed trunk and hips due to bil hip pain; can partially reverse with vc however reverts to flexed posture quickly; pt reports she feels she is tipping over backwards when she tries to step back to sit in a chair (so she doesn't ever step backwards--see transfers);  Walking backwards x 32f x 2   Ambulation Distance (Feet)  100 Feet 20, 20, 65, 65    Assistive device  Rolling walker    Gait Pattern  Step-through pattern;Trunk flexed;Poor foot clearance - right;Decreased stride length;Right foot flat;Left foot flat;Right flexed knee in stance;Left flexed knee in stance;Shuffle;Poor foot clearance - left    Ambulation Surface  Indoor    Gait velocity  32.8 ft/20.5 sec= 1.6 ft/sec      Posture/Postural Control   Posture/Postural Control  Postural limitations    Postural Limitations  Forward head;Decreased lumbar lordosis;Flexed trunk      Standardized Balance  Assessment   Standardized Balance Assessment  Timed Up and Go Test      Timed Up and Go Test   TUG  Normal TUG    Normal TUG (seconds)  49.19             PT Education - 09/26/17 2105    Education provided  Yes    Education Details  results of assessments for STGs;     Person(s) Educated  Patient    Methods  Explanation    Comprehension  Verbalized understanding       PT Short Term Goals - 09/26/17 1005      PT SHORT TERM GOAL #1   Title  Pt will be independent with HEP for improved transfers, gait and balance.  TARGET 09/19/17    Baseline  09/26/17     Time  4    Period  Weeks    Status  Achieved      PT SHORT TERM GOAL #2   Title  Pt will improve TUG score to less than or equal to 18 seconds for decreased fall risk.    Baseline  09/26/17 49.19 sec (increased time due to increased bil hip and back pain)    Time  4    Period  Weeks    Status  Not Met      PT SHORT TERM GOAL #3   Title  Pt will improve gait velocity to at least 1.8 ft/sec for improved gait efficiency and safety.    Baseline  09/26/17 1.6 ft/sec    Time  4    Period  Weeks    Status  Not Met      PT SHORT TERM GOAL #4   Title  Pt will verbalize understanding of fall prevention in the home environment.    Baseline  09/26/17 ran out of time     Time  4    Period  Weeks    Status  Unable to assess      PT SHORT TERM GOAL #5   Title  Patient able to transfer to floor and up off the floor with min assist.    Baseline  09/26/17 deferred due to incr wrist pain    Time  4    Period  Weeks    Status  Deferred        PT Long Term Goals - 08/18/17 1126      PT LONG TERM GOAL #1  Title  Pt will be independent with progression of HEP to address posture, balance, transfers and gait.  TARGET 10/17/17    Time  8    Period  Weeks    Status  New    Target Date  10/17/17      PT LONG TERM GOAL #2   Title  Pt will improve TUG cognitive score to less than or equal to 18 seconds for decreased fall  risk.    Time  8    Period  Weeks    Status  New    Target Date  10/17/17      PT LONG TERM GOAL #3   Title  Pt will improve gait velocity to at least 2 ft/sec for improved gait efficiency and safety.    Time  8    Period  Weeks    Status  New    Target Date  10/17/17      PT LONG TERM GOAL #4   Title  Pt will improve 3 minute walk test by 50 ft for improved efficiency and safety with gait.    Baseline  347 ft in 3 minutes at eval    Time  8    Period  Weeks    Status  New    Target Date  10/17/17      PT LONG TERM GOAL #5   Title  Pt will verbalize plans for ongoing community fitness upon D/C from PT.    Time  8    Period  Weeks    Status  New    Target Date  10/17/17            Plan - 09/26/17 2122    Clinical Impression Statement  Session focused on assessing STGs. Patient has had recent decline due to back pain. She has met 1 of 6 goals, did not meet 2 of 6 goals, one goal was unable to be assessed and one goal was deferred due to painful wrist. Patient's recent increased bil hip and back pain has caused a further decline in her function. Today's assessment is in a manner a "new baseline" from which PT will work moving forward.     Rehab Potential  Good    Clinical Impairments Affecting Rehab Potential  Limitations in L knee (per pt report:  median ligament crosses patella when going flexion>extension and is very painful; pt has adapted, but is limited in weightbearing flex>ext movement patterns)    PT Frequency  2x / week    PT Duration  8 weeks    PT Treatment/Interventions  ADLs/Self Care Home Management;Gait training;DME Instruction;Functional mobility training;Therapeutic activities;Therapeutic exercise;Balance training;Neuromuscular re-education;Patient/family education    PT Next Visit Plan  look at HEP and ?any changes needed due to incr bil hip pain;; gait training with U-step RW    Consulted and Agree with Plan of Care  Patient       Patient will benefit  from skilled therapeutic intervention in order to improve the following deficits and impairments:  Abnormal gait, Decreased balance, Decreased activity tolerance, Decreased mobility, Decreased strength, Difficulty walking, Postural dysfunction  Visit Diagnosis: Other symptoms and signs involving the nervous system  Other symptoms and signs involving the musculoskeletal system  Unsteadiness on feet  Other abnormalities of gait and mobility     Problem List Patient Active Problem List   Diagnosis Date Noted  . Dyspnea on exertion 08/05/2017  . Hyperlipidemia 04/23/2016  . Arthritis 04/23/2016  . Multiple system atrophy  C ([facility]) 10/06/2015  . Postoperative anemia due to acute blood loss 08/29/2015  . 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 Neuromuscular disease ([facility]) 09/26/2014  . Gait difficulty 12/29/2013  . Back pain 12/29/2013  . Paresthesia 12/19/2012  . Essential hypertension 11/28/2012  . Polypharmacy 11/28/2012  . GERD (gastroesophageal reflux disease) 11/28/2012    Lynn P Sasser, PT 09/26/2017, 9:33 PM  [facility] [address] [place], [place], [address] Phone: [phone]   Fax:  [phone]  Name: Jacqueline Atkinson MRN: [id number] Date of Birth: 02/08/1956   

## 2017-09-26 NOTE — Therapy (Signed)
[facility] [address] [place], [name], [address] Phone: [phone]   Fax:  [phone]  Occupational Therapy Treatment  Patient Details  Name: Maricella Filyaw MRN: [id number] Date of Birth: Mar 26, 1956 Referring Provider: Dr. Wells Guiles Tat    Encounter Date: 09/26/2017  OT End of Session - 09/26/17 1623    Visit Number  7    Number of Visits  17    Date for OT Re-Evaluation  10/17/17    Authorization Type  [employer], no visit limit/no auth; G-code needed    Authorization - Visit Number  7    Authorization - Number of Visits  10    OT Start Time  1021    OT Stop Time  1100    OT Time Calculation (min)  39 min    Activity Tolerance  Patient tolerated treatment well    Behavior During Therapy  [facility] for tasks assessed/performed       Past Medical History:  Diagnosis Date  . Allergy   . Anemia   . Anxiety   . Arthritis    Left knee  . Edema extremities    lower extremity edema left greater than right.  Marland Kitchen GERD (gastroesophageal reflux disease)   . Heart murmur   . History of hiatal hernia    small  . Hx of seasonal allergies    occ. use OTC Mucinex or Antihistamine.  . Hyperlipidemia   . Hypertension   . Mobility impaired    ambulates with walker, tendency to lose balance.  . 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 Neuromuscular disorder ([place])    "multi system atrophy disease"- "pain, bilateral foot numbness, left knee pain"- Dr. Carles Collet is following.  . 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 ESOPHAGOGASTRODUODENOSCOPY ENDOSCOPY     with esophageal dilation  . FOOT SURGERY  2011   3rd metatarsal LT foot  . KNEE ARTHROSCOPY Left 02/08/2015   Procedure: LEFT KNEE ARTHROSCOPY ;  Surgeon: Latanya Maudlin, MD;   Location: [facility];  Service: Orthopedics;  Laterality: Left;  . TONSILLECTOMY AND ADENOIDECTOMY    . TOTAL KNEE ARTHROPLASTY Left 08/22/2015   Procedure: LEFT TOTAL  KNEE  ARTHROPLASTY;  Surgeon: Latanya Maudlin, MD;  Location: [facility];  Service: Orthopedics;  Laterality: Left;    There were no vitals filed for this visit.  Subjective Assessment - 09/26/17 1059    Pertinent History  Multiple Systems Atrophy (MSA); L knee replacement 2016; HTN; migraines; dyspnea on exertion; hyperlipidemia    Patient Stated Goals  improve use of R hand for painting, writing               Treatment: Seated PWR! Up, rock, twist x 10 reps each min v.c/ facilitation PWR! hands basic 4 x 10 reps Simulated filling pill box using larger amplitude movements, and shelf liner to make pills easier to pick up. Pt demonstrates increased ease with task. Therapist recommends pt wears wrist brace with prolonged weight bearing or activities that may aggravate wrist.              OT Short Term Goals - 08/26/17 1555      OT SHORT TERM GOAL #1   Title  Pt will be independent with updated HEP.--check STGs 09/17/17  Time  4    Period  Weeks    Status  New      OT SHORT TERM GOAL #2   Title  Pt will improve RUE coordination/functional reaching as shown by improving score on box and blocks test by at least 4 blocks.    Baseline  R-42    Time  4    Period  Weeks    Status  New      OT SHORT TERM GOAL #3   Title  Pt will report incr ease with LB dressing using strategies/AE prn.    Time  4    Period  Weeks    Status  New      OT SHORT TERM GOAL #4   Title  Pt will verbalize understanding of appropriate community resources and ways to prevent future complications.    Time  4    Period  Weeks    Status  New      OT SHORT TERM GOAL #5   Title  Pt will verbalize understanding of compensatory strategies to minimize hand cramps, fatigue and maintain legibility of handwriting.    Baseline  hands  cramp and fatigue quicly, pt demonstrates good legibility of handwriting but she writes very quicly at times    Time  4    Period  Weeks    Status  New        OT Long Term Goals - 08/18/17 1257      OT LONG TERM GOAL #1   Title  Pt will verbalize understanding of adaptive strategies/AE for ADLs/IADLs (including strategies for putting on make-up, donning/doffing bathing suit and leggings, eating, writing, painting).--check LTGs 10/17/17    Time  8    Period  Weeks    Status  New      OT LONG TERM GOAL #2   Title  Pt will improve RUE coordination/functional reaching as shown by improving score on box and blocks test by at least 8 blocks.    Baseline  R-42 blocks    Time  8    Period  Weeks    Status  New      OT LONG TERM GOAL #3   Title  Pt will improve coordination for ADLs as shown by improving time on 9-hole peg test by at least 5sec with RUE.    Baseline  R-37.71sec, L-31.34sec    Time  8    Period  Weeks    Status  New      OT LONG TERM GOAL #4   Title  Pt will report incr ease with writing/painting.    Time  8    Period  Weeks    Status  New      OT LONG TERM GOAL #5   Title  Pt will report using RUE as dominant UE for eating at least 90% of the time.    Time  8    Period  Weeks    Status  New      Long Term Additional Goals   Additional Long Term Goals  --            Plan - 09/26/17 1624    Clinical Impression Statement  Pt is progressing towards goals. she reports her wrist sprain is better. Therapsit recommended that pt wears wrist brace PRN if perfroming activities that may aggravate wrist.    Rehab Potential  Good    OT Frequency  2x / week    OT  Duration  8 weeks    OT Treatment/Interventions  Self-care/ADL training;Neuromuscular education;Therapeutic exercise;Therapeutic activities    Plan  continue ADL strategies,     OT Home Exercise Plan  issued PWR! hands, PWR! supine up, rock and twist, tripod grip for pen, cordination HEP    Consulted and  Agree with Plan of Care  Patient       Patient will benefit from skilled therapeutic intervention in order to improve the following deficits and impairments:  Decreased knowledge of use of DME, Decreased mobility, Decreased strength, Impaired UE functional use, Decreased balance, Decreased activity tolerance, Impaired tone, Improper spinal/pelvic alignment, Decreased endurance, Decreased coordination, Decreased range of motion  Visit Diagnosis: Other symptoms and signs involving the nervous system  Other symptoms and signs involving the musculoskeletal system  Other lack of coordination    Problem List Patient Active Problem List   Diagnosis Date Noted  . Dyspnea on exertion 08/05/2017  . Hyperlipidemia 04/23/2016  . Arthritis 04/23/2016  . 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 Postoperative anemia due to acute blood loss 08/29/2015  . 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 Neuromuscular disease ([place]) 09/26/2014  . Gait difficulty 12/29/2013  . Back pain 12/29/2013  . Paresthesia 12/19/2012  . Essential hypertension 11/28/2012  . Polypharmacy 11/28/2012  . GERD (gastroesophageal reflux disease) 11/28/2012    Korissa Horsford 09/26/2017, 4:39 PM Theone Murdoch, OTR/L [phone] Phone: [phone] 4:42 PM 09/26/17 [facility] [address] [place], [name], [address] Phone: [phone]   Fax:  [phone]  Name: Rokhaya Quinn MRN: [id number] Date of Birth: 29-Jun-1956

## 2017-09-30 ENCOUNTER — Telehealth: Payer: Self-pay | Admitting: Neurology

## 2017-09-30 NOTE — Telephone Encounter (Signed)
Pt called in regards to a walker she is trying to get and needs some clinical notes faxed in order to get this

## 2017-09-30 NOTE — Telephone Encounter (Signed)
Called patient back to find out where she needs notes sent. LMOM for patient to call back.

## 2017-10-01 ENCOUNTER — Ambulatory Visit: Payer: Medicare Other | Attending: Neurology | Admitting: Physical Therapy

## 2017-10-01 ENCOUNTER — Ambulatory Visit: Payer: Medicare Other | Admitting: Occupational Therapy

## 2017-10-01 DIAGNOSIS — G239 Degenerative disease of basal ganglia, unspecified: Secondary | ICD-10-CM | POA: Diagnosis not present

## 2017-10-01 DIAGNOSIS — R29898 Other symptoms and signs involving the musculoskeletal system: Secondary | ICD-10-CM | POA: Diagnosis not present

## 2017-10-01 DIAGNOSIS — R29818 Other symptoms and signs involving the nervous system: Secondary | ICD-10-CM

## 2017-10-01 DIAGNOSIS — R2681 Unsteadiness on feet: Secondary | ICD-10-CM | POA: Diagnosis not present

## 2017-10-01 DIAGNOSIS — R278 Other lack of coordination: Secondary | ICD-10-CM | POA: Insufficient documentation

## 2017-10-01 DIAGNOSIS — R2689 Other abnormalities of gait and mobility: Secondary | ICD-10-CM | POA: Insufficient documentation

## 2017-10-01 DIAGNOSIS — R293 Abnormal posture: Secondary | ICD-10-CM | POA: Diagnosis not present

## 2017-10-01 DIAGNOSIS — R269 Unspecified abnormalities of gait and mobility: Secondary | ICD-10-CM | POA: Diagnosis not present

## 2017-10-01 DIAGNOSIS — R32 Unspecified urinary incontinence: Secondary | ICD-10-CM | POA: Diagnosis not present

## 2017-10-01 NOTE — Therapy (Signed)
[facility] [address] [place], [name], [address] Phone: [phone]   Fax:  [phone]  Physical Therapy Treatment  Patient Details  Name: Jacqueline Atkinson MRN: [id number] Date of Birth: Nov 30, 1955 Referring Provider: Wells Guiles Tat   Encounter Date: 10/01/2017  PT End of Session - 10/01/17 1149    Visit Number  8    Number of Visits  17    Date for PT Re-Evaluation  10/17/17    Authorization Type  [employer]    PT Start Time  1020    PT Stop Time  1103    PT Time Calculation (min)  43 min    Equipment Utilized During Treatment  Gait belt    Activity Tolerance  Patient tolerated treatment well    Behavior During Therapy  WFL for tasks assessed/performed       Past Medical History:  Diagnosis Date  . Allergy   . Anemia   . Anxiety   . Arthritis    Left knee  . Edema extremities    lower extremity edema left greater than right.  Marland Kitchen GERD (gastroesophageal reflux disease)   . Heart murmur   . History of hiatal hernia    small  . Hx of seasonal allergies    occ. use OTC Mucinex or Antihistamine.  . Hyperlipidemia   . Hypertension   . Mobility impaired    ambulates with walker, tendency to lose balance.  . 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 Neuromuscular disorder ([place])    "multi system atrophy disease"- "pain, bilateral foot numbness, left knee pain"- Dr. Carles Collet is following.  . 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 ESOPHAGOGASTRODUODENOSCOPY ENDOSCOPY     with esophageal dilation  . FOOT SURGERY  2011   3rd metatarsal LT foot  . KNEE ARTHROSCOPY Left 02/08/2015   Procedure: LEFT KNEE ARTHROSCOPY ;  Surgeon: Latanya Maudlin, MD;  Location: [facility];  Service: Orthopedics;  Laterality: Left;  . TONSILLECTOMY AND  ADENOIDECTOMY    . TOTAL KNEE ARTHROPLASTY Left 08/22/2015   Procedure: LEFT TOTAL  KNEE  ARTHROPLASTY;  Surgeon: Latanya Maudlin, MD;  Location: [facility];  Service: Orthopedics;  Laterality: Left;    There were no vitals filed for this visit.  Subjective Assessment - 10/01/17 1022    Subjective  Blood pressure "has been beautiful," since last visit.  Pt reports that her wrist feels better and would like to practise to get up off the floor before doing it at home again. CT tomorrow ordered by orthopedist    Pertinent History  chronic back pain, L knee replacement 08/23/15 (median ligament crosses over the patella with knee extension-limits WB flexion>ext with standing or riding bike), reflux.    How long can you stand comfortably?  Cannot stand >10-15 minutes before too much hip pain    Patient Stated Goals  Want to be able to stand longer, work on safety with walker, getting in and out of van    Currently in Pain?  No/denies    Pain Onset  More than a month ago                      [facility] Adult PT Treatment/Exercise - 10/01/17 0001      Transfers  Transfers  Sit to Stand;Stand to Sit    Sit to Stand  5: Supervision    Stand to Sit  5: Supervision    Comments  Pt required increased time and multiple attempts.      Ambulation/Gait   Ambulation/Gait  Yes    Ambulation/Gait Assistance  5: Supervision    Ambulation/Gait Assistance Details  worked on endurance, balance with visual scanning, and when to take standing rest breaks to adjust posture and safe positioning.                              Ambulation Distance (Feet)  115 Feet 140    Assistive device  Rolling walker    Gait Pattern  Step-through pattern;Trunk flexed;Poor foot clearance - right;Decreased stride length;Right foot flat;Left foot flat;Right flexed knee in stance;Left flexed knee in stance;Shuffle;Poor foot clearance - left    Ambulation Surface  Indoor      Knee/Hip Exercises: Seated   Long Arc Quad   Strengthening;Right;Left;1 set;5 reps          Balance Exercises - 10/01/17 1159      Balance Exercises: Standing   Stepping Strategy  Anterior;Posterior;UE support cues for weight shifting technique        PT Education - 10/01/17 1032    Education provided  Yes    Education Details  Pt would like to continue with current HEP but knows to adjust on days she has more pain.  Pt still plans to get a U-Step walker but is having trouble with getting it ordered and is working with insurance.  Updated HEP to include walking program.                                    Person(s) Educated  Patient    Methods  Explanation;Verbal cues    Comprehension  Verbalized understanding;Returned demonstration;Verbal cues required;Need further instruction       PT Short Term Goals - 09/26/17 1005      PT SHORT TERM GOAL #1   Title  Pt will be independent with HEP for improved transfers, gait and balance.  TARGET 09/19/17    Baseline  09/26/17     Time  4    Period  Weeks    Status  Achieved      PT SHORT TERM GOAL #2   Title  Pt will improve TUG score to less than or equal to 18 seconds for decreased fall risk.    Baseline  09/26/17 49.19 sec (increased time due to increased bil hip and back pain)    Time  4    Period  Weeks    Status  Not Met      PT SHORT TERM GOAL #3   Title  Pt will improve gait velocity to at least 1.8 ft/sec for improved gait efficiency and safety.    Baseline  09/26/17 1.6 ft/sec    Time  4    Period  Weeks    Status  Not Met      PT SHORT TERM GOAL #4   Title  Pt will verbalize understanding of fall prevention in the home environment.    Baseline  09/26/17 ran out of time     Time  4    Period  Weeks    Status  Unable to assess  PT SHORT TERM GOAL #5   Title  Patient able to transfer to floor and up off the floor with min assist.    Baseline  09/26/17 deferred due to incr wrist pain    Time  4    Period  Weeks    Status  Deferred        PT Long  Term Goals - 08/18/17 1126      PT LONG TERM GOAL #1   Title  Pt will be independent with progression of HEP to address posture, balance, transfers and gait.  TARGET 10/17/17    Time  8    Period  Weeks    Status  New    Target Date  10/17/17      PT LONG TERM GOAL #2   Title  Pt will improve TUG cognitive score to less than or equal to 18 seconds for decreased fall risk.    Time  8    Period  Weeks    Status  New    Target Date  10/17/17      PT LONG TERM GOAL #3   Title  Pt will improve gait velocity to at least 2 ft/sec for improved gait efficiency and safety.    Time  8    Period  Weeks    Status  New    Target Date  10/17/17      PT LONG TERM GOAL #4   Title  Pt will improve 3 minute walk test by 50 ft for improved efficiency and safety with gait.    Baseline  347 ft in 3 minutes at eval    Time  8    Period  Weeks    Status  New    Target Date  10/17/17      PT LONG TERM GOAL #5   Title  Pt will verbalize plans for ongoing community fitness upon D/C from PT.    Time  8    Period  Weeks    Status  New    Target Date  10/17/17            Plan - 10/01/17 1030    Clinical Impression Statement  Pt plans to continue performing HEP on mat which is set up on bed as pain allows.  Pt would like to practise floor transfers again now that right wrist does not hurt but OT recommends that pt perform with wrist brace for support.  Updated HEP with walking program to work on functional endurance and balance with visual scanning and posture checks.  Pt was able to perform 3 min walk but reported fatigue at the end.  Worked on stepping and weight shifting backwards with counter and walker for support; pt reported imbalance and performed at supervision level.                                          Rehab Potential  Good    Clinical Impairments Affecting Rehab Potential  Limitations in L knee (per pt report:  median ligament crosses patella when going flexion>extension and is very  painful; pt has adapted, but is limited in weightbearing flex>ext movement patterns)    PT Frequency  2x / week    PT Duration  8 weeks    PT Treatment/Interventions  ADLs/Self Care Home Management;Gait training;DME Instruction;Functional mobility training;Therapeutic activities;Therapeutic exercise;Balance training;Neuromuscular re-education;Patient/family education  PT Next Visit Plan  Review floor transfer (per pt request on 10/01/17 with right wrist brace); gait training with U-step RW; stepping strategies at the counter and walking backwards at counter    Consulted and Agree with Plan of Care  Patient       Patient will benefit from skilled therapeutic intervention in order to improve the following deficits and impairments:  Abnormal gait, Decreased balance, Decreased activity tolerance, Decreased mobility, Decreased strength, Difficulty walking, Postural dysfunction  Visit Diagnosis: Other symptoms and signs involving the nervous system  Unsteadiness on feet  Abnormal posture  Other symptoms and signs involving the musculoskeletal system     Problem List Patient Active Problem List   Diagnosis Date Noted  . Dyspnea on exertion 08/05/2017  . Hyperlipidemia 04/23/2016  . Arthritis 04/23/2016  . 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 Postoperative anemia due to acute blood loss 08/29/2015  . 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 Neuromuscular disease ([place]) 09/26/2014  . Gait difficulty 12/29/2013  . Back pain 12/29/2013  . Paresthesia 12/19/2012  . Essential hypertension 11/28/2012  . Polypharmacy 11/28/2012  . GERD (gastroesophageal reflux disease) 11/28/2012   Bjorn Loser, PTA  10/01/17, 12:01 PM [facility] [address] [place], [name], [address] Phone: [phone]   Fax:  [phone]  Name: Jacqueline Atkinson MRN: [id number] Date of Birth: [birth year]

## 2017-10-01 NOTE — Patient Instructions (Signed)
Walking Program:  Begin walking for exercise for 2 minutes, 2 times/day, mostdays/week.   Progress your walking program by adding 1 minutes to your routine each week, as tolerated. Be sure to wear good walking shoes, walk in a safe environment and only progress to your tolerance.      ++++Take standing rest breaks as needed to adjust posture and for safety for turns Practise looking ahead Visual scanning

## 2017-10-01 NOTE — Telephone Encounter (Signed)
Spoke with patient and she requested last note be sent to [facility] at [phone] with confirmation received.

## 2017-10-01 NOTE — Therapy (Signed)
[facility] [address] [place], [name], [address] Phone: [phone]   Fax:  [phone]  Occupational Therapy Treatment  Patient Details  Name: Jacqueline Atkinson MRN: [id number] Date of Birth: [birth year] Referring Provider: Dr. Wells Guiles Tat    Encounter Date: 10/01/2017  OT End of Session - 10/01/17 1223    Visit Number  8    Number of Visits  17    Date for OT Re-Evaluation  10/17/17    Authorization Type  [employer] Medicare, no visit limit/no auth; G-code needed    Authorization - Visit Number  8    Authorization - Number of Visits  10    OT Start Time  0935    OT Stop Time  1015    OT Time Calculation (min)  40 min    Activity Tolerance  Patient tolerated treatment well    Behavior During Therapy  [facility] for tasks assessed/performed       Past Medical History:  Diagnosis Date  . Allergy   . Anemia   . Anxiety   . Arthritis    Left knee  . Edema extremities    lower extremity edema left greater than right.  Marland Kitchen GERD (gastroesophageal reflux disease)   . Heart murmur   . History of hiatal hernia    small  . Hx of seasonal allergies    occ. use OTC Mucinex or Antihistamine.  . Hyperlipidemia   . Hypertension   . Mobility impaired    ambulates with walker, tendency to lose balance.  . 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 Neuromuscular disorder ([place])    "multi system atrophy disease"- "pain, bilateral foot numbness, left knee pain"- Dr. Carles Collet is following.  . 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 ESOPHAGOGASTRODUODENOSCOPY ENDOSCOPY     with esophageal dilation  . FOOT SURGERY  2011   3rd metatarsal LT foot  . KNEE ARTHROSCOPY Left 02/08/2015   Procedure: LEFT KNEE ARTHROSCOPY ;  Surgeon: Latanya Maudlin, MD;   Location: [facility];  Service: Orthopedics;  Laterality: Left;  . TONSILLECTOMY AND ADENOIDECTOMY    . TOTAL KNEE ARTHROPLASTY Left 08/22/2015   Procedure: LEFT TOTAL  KNEE  ARTHROPLASTY;  Surgeon: Latanya Maudlin, MD;  Location: [facility];  Service: Orthopedics;  Laterality: Left;    There were no vitals filed for this visit.  Subjective Assessment - 10/01/17 1223    Pertinent History  Multiple Systems Atrophy (MSA); L knee replacement 2016; HTN; migraines; dyspnea on exertion; hyperlipidemia    Patient Stated Goals  improve use of R hand for painting, writing    Currently in Pain?  No/denies          Treatment: Supine PWR up, rock and twist 10-20 reps each as warm up. Lengthy discussion with pt regarding activities that are challenging for her, washing hair, while seated, and increased spills with feeding. Pt reports increasing frustration and depression regarding her situation. Pt reports that she goes to [employer] to pick up prescriptions by herself, she is wiped out. Therapiststarted talking with pt regarding energy conservation and allowing her family to assist her more. Therapist suggested that pt may want to have a family member come in for education. Pt plans to begin picking her prescriptions up at drive thru pharmacy and  she is going to try out the online shopping option where they load groceries into the car for you. Sit to stand x 5 reps for simulating getting up and down from handicap toilet with only grab bar on left . Pt placed her hand on right knee and she was encouraged to lean way forward, education not to have feet too far back. Pt demonstrates improved performance following repetition                    OT Short Term Goals - 08/26/17 1555      OT SHORT TERM GOAL #1   Title  Pt will be independent with updated HEP.--check STGs 09/17/17    Time  4    Period  Weeks    Status  New      OT SHORT TERM GOAL #2   Title  Pt will improve RUE  coordination/functional reaching as shown by improving score on box and blocks test by at least 4 blocks.    Baseline  R-42    Time  4    Period  Weeks    Status  New      OT SHORT TERM GOAL #3   Title  Pt will report incr ease with LB dressing using strategies/AE prn.    Time  4    Period  Weeks    Status  New      OT SHORT TERM GOAL #4   Title  Pt will verbalize understanding of appropriate community resources and ways to prevent future complications.    Time  4    Period  Weeks    Status  New      OT SHORT TERM GOAL #5   Title  Pt will verbalize understanding of compensatory strategies to minimize hand cramps, fatigue and maintain legibility of handwriting.    Baseline  hands cramp and fatigue quicly, pt demonstrates good legibility of handwriting but she writes very quicly at times    Time  4    Period  Weeks    Status  New        OT Long Term Goals - 08/18/17 1257      OT LONG TERM GOAL #1   Title  Pt will verbalize understanding of adaptive strategies/AE for ADLs/IADLs (including strategies for putting on make-up, donning/doffing bathing suit and leggings, eating, writing, painting).--check LTGs 10/17/17    Time  8    Period  Weeks    Status  New      OT LONG TERM GOAL #2   Title  Pt will improve RUE coordination/functional reaching as shown by improving score on box and blocks test by at least 8 blocks.    Baseline  R-42 blocks    Time  8    Period  Weeks    Status  New      OT LONG TERM GOAL #3   Title  Pt will improve coordination for ADLs as shown by improving time on 9-hole peg test by at least 5sec with RUE.    Baseline  R-37.71sec, L-31.34sec    Time  8    Period  Weeks    Status  New      OT LONG TERM GOAL #4   Title  Pt will report incr ease with writing/painting.    Time  8    Period  Weeks    Status  New      OT LONG TERM GOAL #5  Title  Pt will report using RUE as dominant UE for eating at least 90% of the time.    Time  8    Period  Weeks     Status  New      Long Term Additional Goals   Additional Long Term Goals  --            Plan - 10/01/17 1224    Clinical Impression Statement  Pt is progressing towards goals. She reports increasing frustration and depression regarding how long it takes he to complete tasks and how quicckly she fatigues.    Occupational Profile and client history currently impacting functional performance  Pt is a 61 y.o. female with Multiple Systems Atrophy who reports decline in function over the last year.  Pt with PMH that includes:  L knee replacement 2016; HTN; migraines; dyspnea on exertion; hyperlipidemia.  Pt reports decr dominant RUE functional use for ADLs and leisure activities affecting quality of life.      Occupational performance deficits (Please refer to evaluation for details):  ADL's;IADL's;Leisure;Social Participation;Rest and Sleep    Rehab Potential  Good    OT Frequency  2x / week    OT Duration  8 weeks    OT Treatment/Interventions  Self-care/ADL training;Neuromuscular education;Therapeutic exercise;Therapeutic activities    Plan  continue ADL strategies, practice eating with larger foam grip in spoon    OT Home Exercise Plan  issued PWR! hands, PWR! supine up, rock and twist, tripod grip for pen, cordination HEP    Consulted and Agree with Plan of Care  Patient       Patient will benefit from skilled therapeutic intervention in order to improve the following deficits and impairments:  Decreased knowledge of use of DME, Decreased mobility, Decreased strength, Impaired UE functional use, Decreased balance, Decreased activity tolerance, Impaired tone, Improper spinal/pelvic alignment, Decreased endurance, Decreased coordination, Decreased range of motion  Visit Diagnosis: Other symptoms and signs involving the nervous system  Other lack of coordination  Other symptoms and signs involving the musculoskeletal system    Problem List Patient Active Problem List    Diagnosis Date Noted  . Dyspnea on exertion 08/05/2017  . Hyperlipidemia 04/23/2016  . Arthritis 04/23/2016  . 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 Postoperative anemia due to acute blood loss 08/29/2015  . 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 Neuromuscular disease ([place]) 09/26/2014  . Gait difficulty 12/29/2013  . Back pain 12/29/2013  . Paresthesia 12/19/2012  . Essential hypertension 11/28/2012  . Polypharmacy 11/28/2012  . GERD (gastroesophageal reflux disease) 11/28/2012    Myrlene Riera 10/01/2017, 12:26 PM  [facility] [address] [place], [name], [address] Phone: [phone]   Fax:  [phone]  Name: Jacqueline Atkinson MRN: [id number] Date of Birth: Aug 17, 1956

## 2017-10-02 ENCOUNTER — Telehealth: Payer: Self-pay | Admitting: Neurology

## 2017-10-02 ENCOUNTER — Ambulatory Visit
Admission: RE | Admit: 2017-10-02 | Discharge: 2017-10-02 | Disposition: A | Discharge status: Home / Self Care | Payer: Self-pay | Source: Ambulatory Visit | Attending: Orthopedic Surgery | Admitting: Orthopedic Surgery

## 2017-10-02 ENCOUNTER — Ambulatory Visit
Admission: RE | Admit: 2017-10-02 | Discharge: 2017-10-02 | Disposition: A | Discharge status: Home / Self Care | Payer: Medicare Other | Source: Ambulatory Visit | Attending: Orthopedic Surgery | Admitting: Orthopedic Surgery

## 2017-10-02 ENCOUNTER — Other Ambulatory Visit: Payer: Self-pay | Admitting: Orthopedic Surgery

## 2017-10-02 DIAGNOSIS — M545 Low back pain, unspecified: Secondary | ICD-10-CM

## 2017-10-02 DIAGNOSIS — G8929 Other chronic pain: Secondary | ICD-10-CM

## 2017-10-02 MED ORDER — IOPAMIDOL (ISOVUE-300) INJECTION 61%
100.0000 mL | Freq: Once | INTRAVENOUS | Status: AC | PRN
  Administered 2017-10-02: 100 mL via INTRAVENOUS

## 2017-10-02 NOTE — Telephone Encounter (Signed)
Pt needs orders for the equipment faxed to the same number you faxed notes too

## 2017-10-02 NOTE — Telephone Encounter (Signed)
Order for Ustep walker faxed [phone] with confirmation received.

## 2017-10-03 ENCOUNTER — Encounter: Payer: Self-pay | Admitting: Physical Therapy

## 2017-10-03 ENCOUNTER — Ambulatory Visit: Payer: Medicare Other | Admitting: Occupational Therapy

## 2017-10-03 ENCOUNTER — Ambulatory Visit: Payer: Medicare Other | Admitting: Physical Therapy

## 2017-10-03 DIAGNOSIS — R278 Other lack of coordination: Secondary | ICD-10-CM

## 2017-10-03 DIAGNOSIS — R29898 Other symptoms and signs involving the musculoskeletal system: Secondary | ICD-10-CM | POA: Diagnosis not present

## 2017-10-03 DIAGNOSIS — R293 Abnormal posture: Secondary | ICD-10-CM | POA: Diagnosis not present

## 2017-10-03 DIAGNOSIS — R29818 Other symptoms and signs involving the nervous system: Secondary | ICD-10-CM | POA: Diagnosis not present

## 2017-10-03 DIAGNOSIS — R2689 Other abnormalities of gait and mobility: Secondary | ICD-10-CM | POA: Diagnosis not present

## 2017-10-03 DIAGNOSIS — R2681 Unsteadiness on feet: Secondary | ICD-10-CM | POA: Diagnosis not present

## 2017-10-03 NOTE — Therapy (Signed)
[facility] [address] [place], [name], [address] Phone: [phone]   Fax:  [phone]  Occupational Therapy Treatment  Patient Details  Name: Jacqueline Atkinson MRN: [id number] Date of Birth: [birth year] Referring Provider: Dr. Wells Guiles Tat    Encounter Date: 10/03/2017  OT End of Session - 10/03/17 1000    Visit Number  9    Number of Visits  17    Date for OT Re-Evaluation  10/17/17    Authorization Type  [employer] Medicare, no visit limit/no auth; G-code needed    Authorization - Visit Number  9    Authorization - Number of Visits  10    OT Start Time  0935    OT Stop Time  1015    OT Time Calculation (min)  40 min    Activity Tolerance  Patient tolerated treatment well    Behavior During Therapy  [facility] for tasks assessed/performed       Past Medical History:  Diagnosis Date  . Allergy   . Anemia   . Anxiety   . Arthritis    Left knee  . Edema extremities    lower extremity edema left greater than right.  Marland Kitchen GERD (gastroesophageal reflux disease)   . Heart murmur   . History of hiatal hernia    small  . Hx of seasonal allergies    occ. use OTC Mucinex or Antihistamine.  . Hyperlipidemia   . Hypertension   . Mobility impaired    ambulates with walker, tendency to lose balance.  . 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 Neuromuscular disorder ([place])    "multi system atrophy disease"- "pain, bilateral foot numbness, left knee pain"- Dr. Carles Collet is following.  . 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 ESOPHAGOGASTRODUODENOSCOPY ENDOSCOPY     with esophageal dilation  . FOOT SURGERY  2011   3rd metatarsal LT foot  . KNEE ARTHROSCOPY Left 02/08/2015   Procedure: LEFT KNEE ARTHROSCOPY ;  Surgeon: Latanya Maudlin, MD;   Location: [facility];  Service: Orthopedics;  Laterality: Left;  . TONSILLECTOMY AND ADENOIDECTOMY    . TOTAL KNEE ARTHROPLASTY Left 08/22/2015   Procedure: LEFT TOTAL  KNEE  ARTHROPLASTY;  Surgeon: Latanya Maudlin, MD;  Location: [facility];  Service: Orthopedics;  Laterality: Left;    There were no vitals filed for this visit.  Subjective Assessment - 10/03/17 0944    Pertinent History  Multiple Systems Atrophy (MSA); L knee replacement 2016; HTN; migraines; dyspnea on exertion; hyperlipidemia    Limitations  **check BP; fall risk    Patient Stated Goals  improve use of R hand for painting, writing    Currently in Pain?  No/denies             Treatment: Pt was educated in adapted strategies for feeding and cutting food with big movements. Pt was issued foam grip for utensils and instructed to hold spoon further down for greater stability. Pt practiced handwriting with pt taking rest breaks/ performing flicks when hand gets tired, tripod grip used. Practiced continuous "l" Pt demonstrates good legibility and letter size when she slows down. PWR! Hands x 10 reps. Checked box/ blocks, see below                OT Short  Term Goals - 10/03/17 1001      OT SHORT TERM GOAL #1   Title  Pt will be independent with updated HEP.--check STGs 09/17/17    Status  Achieved      OT SHORT TERM GOAL #2   Title  Pt will improve RUE coordination/functional reaching as shown by improving score on box and blocks test by at least 4 blocks.    Status  On-going 36 blocks      OT SHORT TERM GOAL #3   Title  Pt will report incr ease with LB dressing using strategies/AE prn.    Status  On-going      OT SHORT TERM GOAL #4   Title  Pt will verbalize understanding of appropriate community resources and ways to prevent future complications.    Status  On-going      OT SHORT TERM GOAL #5   Title  Pt will verbalize understanding of compensatory strategies to minimize hand cramps, fatigue and maintain  legibility of handwriting.    Status  Achieved        OT Long Term Goals - 08/18/17 1257      OT LONG TERM GOAL #1   Title  Pt will verbalize understanding of adaptive strategies/AE for ADLs/IADLs (including strategies for putting on make-up, donning/doffing bathing suit and leggings, eating, writing, painting).--check LTGs 10/17/17    Time  8    Period  Weeks    Status  New      OT LONG TERM GOAL #2   Title  Pt will improve RUE coordination/functional reaching as shown by improving score on box and blocks test by at least 8 blocks.    Baseline  R-42 blocks    Time  8    Period  Weeks    Status  New      OT LONG TERM GOAL #3   Title  Pt will improve coordination for ADLs as shown by improving time on 9-hole peg test by at least 5sec with RUE.    Baseline  R-37.71sec, L-31.34sec    Time  8    Period  Weeks    Status  New      OT LONG TERM GOAL #4   Title  Pt will report incr ease with writing/painting.    Time  8    Period  Weeks    Status  New      OT LONG TERM GOAL #5   Title  Pt will report using RUE as dominant UE for eating at least 90% of the time.    Time  8    Period  Weeks    Status  New      Long Term Additional Goals   Additional Long Term Goals  --            Plan - 10/03/17 1008    Clinical Impression Statement  Pt is progressing towards goals for ADL strategies.    Rehab Potential  Good    OT Frequency  2x / week    OT Duration  8 weeks    OT Treatment/Interventions  Self-care/ADL training;Neuromuscular education;Therapeutic exercise;Therapeutic activities    Plan  continue ADL strategies, G code next visit    OT Home Exercise Plan  issued PWR! hands, PWR! supine up, rock and twist, tripod grip for pen, cordination HEP    Consulted and Agree with Plan of Care  Patient       Patient will benefit from skilled therapeutic intervention  in order to improve the following deficits and impairments:  Decreased knowledge of use of DME, Decreased  mobility, Decreased strength, Impaired UE functional use, Decreased balance, Decreased activity tolerance, Impaired tone, Improper spinal/pelvic alignment, Decreased endurance, Decreased coordination, Decreased range of motion  Visit Diagnosis: Other symptoms and signs involving the nervous system  Other symptoms and signs involving the musculoskeletal system  Other lack of coordination    Problem List Patient Active Problem List   Diagnosis Date Noted  . Dyspnea on exertion 08/05/2017  . Hyperlipidemia 04/23/2016  . Arthritis 04/23/2016  . 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 Postoperative anemia due to acute blood loss 08/29/2015  . 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 Neuromuscular disease ([place]) 09/26/2014  . Gait difficulty 12/29/2013  . Back pain 12/29/2013  . Paresthesia 12/19/2012  . Essential hypertension 11/28/2012  . Polypharmacy 11/28/2012  . GERD (gastroesophageal reflux disease) 11/28/2012    RINE,KATHRYN 10/03/2017, 12:48 PM  [facility] [address] [place], [name], [address] Phone: [phone]   Fax:  [phone]  Name: Jacqueline Atkinson MRN: [id number] Date of Birth: 1956-08-17

## 2017-10-03 NOTE — Patient Instructions (Signed)
Single Step: Forward / Backward    Start with your back against the wall. Walker within reach and lightly rest hands on handles. Lifting foot off floor, take one step slowly forward with right leg. Return to starting position. Take one step backward and return. Be sure to stand tall against wall in between each rep.  Repeat __5__ times with each leg. Do _1___ sessions per day.   Copyright  VHI. All rights reserved.    Posture Tips  DO: - stand tall and erect - keep chin tucked in - keep head and shoulders in alignment - check posture regularly in mirror or large window - pull head back against headrest in car seat DON'T: - slouch or slump while watching TV or reading - sit, stand or lie in one position for too long - believe you are too bent out of shape to change -- It can be done with practice and a little more effort!   Posture - Standing      Good posture is important. Avoid slouching and forward head thrust. Maintain curve in low back and align ears over shoul- ders, hips over ankles.   Copyright  VHI. All rights reserved.

## 2017-10-04 NOTE — Therapy (Signed)
[facility] [address] [place], [name], [address] Phone: [phone]   Fax:  [phone]  Physical Therapy Treatment  Patient Details  Name: Jacqueline Atkinson MRN: [id number] Date of Birth: 22-Feb-1956 Referring Provider: Wells Guiles Tat   Encounter Date: 10/03/2017  PT End of Session - 10/04/17 0758    Visit Number  9    Number of Visits  17    Date for PT Re-Evaluation  10/17/17    Authorization Type  [employer]    PT Start Time  1015    PT Stop Time  1100    PT Time Calculation (min)  45 min    Equipment Utilized During Treatment  Gait belt    Activity Tolerance  Patient tolerated treatment well    Behavior During Therapy  [facility] for tasks assessed/performed       Past Medical History:  Diagnosis Date  . Allergy   . Anemia   . Anxiety   . Arthritis    Left knee  . Edema extremities    lower extremity edema left greater than right.  Marland Kitchen GERD (gastroesophageal reflux disease)   . Heart murmur   . History of hiatal hernia    small  . Hx of seasonal allergies    occ. use OTC Mucinex or Antihistamine.  . Hyperlipidemia   . Hypertension   . Mobility impaired    ambulates with walker, tendency to lose balance.  . 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 Neuromuscular disorder ([place])    "multi system atrophy disease"- "pain, bilateral foot numbness, left knee pain"- Dr. Carles Collet is following.  . 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 ESOPHAGOGASTRODUODENOSCOPY ENDOSCOPY     with esophageal dilation  . FOOT SURGERY  2011   3rd metatarsal LT foot  . KNEE ARTHROSCOPY Left 02/08/2015   Procedure: LEFT KNEE ARTHROSCOPY ;  Surgeon: Latanya Maudlin, MD;  Location: [facility];  Service: Orthopedics;  Laterality: Left;  . TONSILLECTOMY AND  ADENOIDECTOMY    . TOTAL KNEE ARTHROPLASTY Left 08/22/2015   Procedure: LEFT TOTAL  KNEE  ARTHROPLASTY;  Surgeon: Latanya Maudlin, MD;  Location: [facility];  Service: Orthopedics;  Laterality: Left;    There were no vitals filed for this visit.  Subjective Assessment - 10/03/17 1024    Subjective  Blood pressure continues to be great. I brought my wrist splint so we can practice getting down and up from the floor.     Pertinent History  chronic back pain, L knee replacement 08/23/15 (median ligament crosses over the patella with knee extension-limits WB flexion>ext with standing or riding bike), reflux.    How long can you stand comfortably?  Cannot stand >10-15 minutes before too much hip pain    Patient Stated Goals  Want to be able to stand longer, work on safety with walker, getting in and out of van    Currently in Pain?  No/denies    Pain Onset  More than a month ago                      [facility] Adult PT Treatment/Exercise - 10/03/17 1040      Transfers   Transfers  Sit to Stand;Stand to Sit;Floor to Transfer    Sit to  Stand  5: Supervision    Sit to Stand Details (indicate cue type and reason)  for safety, pt requires incr time and unsteady    Stand to Sit  5: Supervision    Floor to Transfer  4: Min guard    Floor to Transfer Details (indicate cue type and reason)  placing hands on mat table, RLE lowered her down onto lt knee on a yoga block (4") and then rt knee down to floor. Able to then transition to quadruped and around to supine; able to reverse process for return to sit on edge of mat table    Comments  floor transfer x 2      Ambulation/Gait   Ambulation/Gait Assistance  5: Supervision    Ambulation/Gait Assistance Details  vc for upright posture and less weight thru bil UEs     Ambulation Distance (Feet)  120 Feet 50    Assistive device  Other (Comment) U step walker    Gait Pattern  Step-through pattern;Decreased stride length;Trunk flexed;Poor foot  clearance - left;Poor foot clearance - right    Ambulation Surface  Indoor        [facility]) - 10/04/17 0752    PWR! Twist  20    Comments  on floor on mat; further ex's deferred to assure pt had energy to get off the floor the second time       Balance Exercises - 10/04/17 0753      Balance Exercises: Standing   Other Standing Exercises  Back against wall with upright posture x 60 seconds with pt focusing on feeling centered over her feet (she feels she is leaning backwards even with support of the wall. Practiced one step forward with each foot, then one step back to wall  with very light UE support on walker for pt to be able to perceive where upright position is and more safely be able to take steps backwards.         PT Education - 10/04/17 0756    Education provided  Yes    Education Details  addition to HEP to habituate feeling of upright and ability to step backwards without feeling she is falling backwards    Person(s) Educated  Patient    Methods  Explanation;Demonstration;Handout;Verbal cues    Comprehension  Verbalized understanding;Returned demonstration;Verbal cues required;Need further instruction       PT Short Term Goals - 09/26/17 1005      PT SHORT TERM GOAL #1   Title  Pt will be independent with HEP for improved transfers, gait and balance.  TARGET 09/19/17    Baseline  09/26/17     Time  4    Period  Weeks    Status  Achieved      PT SHORT TERM GOAL #2   Title  Pt will improve TUG score to less than or equal to 18 seconds for decreased fall risk.    Baseline  09/26/17 49.19 sec (increased time due to increased bil hip and back pain)    Time  4    Period  Weeks    Status  Not Met      PT SHORT TERM GOAL #3   Title  Pt will improve gait velocity to at least 1.8 ft/sec for improved gait efficiency and safety.    Baseline  09/26/17 1.6 ft/sec    Time  4    Period  Weeks    Status  Not Met  PT SHORT TERM GOAL #4   Title  Pt will verbalize  understanding of fall prevention in the home environment.    Baseline  09/26/17 ran out of time     Time  4    Period  Weeks    Status  Unable to assess      PT SHORT TERM GOAL #5   Title  Patient able to transfer to floor and up off the floor with min assist.    Baseline  09/26/17 deferred due to incr wrist pain    Time  4    Period  Weeks    Status  Deferred        PT Long Term Goals - 08/18/17 1126      PT LONG TERM GOAL #1   Title  Pt will be independent with progression of HEP to address posture, balance, transfers and gait.  TARGET 10/17/17    Time  8    Period  Weeks    Status  New    Target Date  10/17/17      PT LONG TERM GOAL #2   Title  Pt will improve TUG cognitive score to less than or equal to 18 seconds for decreased fall risk.    Time  8    Period  Weeks    Status  New    Target Date  10/17/17      PT LONG TERM GOAL #3   Title  Pt will improve gait velocity to at least 2 ft/sec for improved gait efficiency and safety.    Time  8    Period  Weeks    Status  New    Target Date  10/17/17      PT LONG TERM GOAL #4   Title  Pt will improve 3 minute walk test by 50 ft for improved efficiency and safety with gait.    Baseline  347 ft in 3 minutes at eval    Time  8    Period  Weeks    Status  New    Target Date  10/17/17      PT LONG TERM GOAL #5   Title  Pt will verbalize plans for ongoing community fitness upon D/C from PT.    Time  8    Period  Weeks    Status  New    Target Date  10/17/17            Plan - 10/04/17 0759    Clinical Impression Statement  Patient wore wrist brace in order to practice floor transfers. Also focused on retraining sense of upright as pt feels she is tipping backwards when she is truly in vertical position, causing her to flex forward at her hips and put incr weight through her UEs on RW. Patient pleased with her progress today and can continue to benefit from skilled PT.    Rehab Potential  Good    Clinical  Impairments Affecting Rehab Potential  Limitations in L knee (per pt report:  median ligament crosses patella when going flexion>extension and is very painful; pt has adapted, but is limited in weightbearing flex>ext movement patterns)    PT Frequency  2x / week    PT Duration  8 weeks    PT Treatment/Interventions  ADLs/Self Care Home Management;Gait training;DME Instruction;Functional mobility training;Therapeutic activities;Therapeutic exercise;Balance training;Neuromuscular re-education;Patient/family education    PT Next Visit Plan  Review addition to HEP 12/7; gait training with U-step RW; stepping strategies and  walking backwards in // bars; begin to discuss d/c plan?? (LTG due 12/21)    Consulted and Agree with Plan of Care  Patient       Patient will benefit from skilled therapeutic intervention in order to improve the following deficits and impairments:  Abnormal gait, Decreased balance, Decreased activity tolerance, Decreased mobility, Decreased strength, Difficulty walking, Postural dysfunction  Visit Diagnosis: Other symptoms and signs involving the nervous system  Unsteadiness on feet  Abnormal posture  Other abnormalities of gait and mobility     Problem List Patient Active Problem List   Diagnosis Date Noted  . Dyspnea on exertion 08/05/2017  . Hyperlipidemia 04/23/2016  . Arthritis 04/23/2016  . 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 Postoperative anemia due to acute blood loss 08/29/2015  . 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 Neuromuscular disease ([place]) 09/26/2014  . Gait difficulty 12/29/2013  . Back pain 12/29/2013  . Paresthesia 12/19/2012  . Essential hypertension 11/28/2012  . Polypharmacy 11/28/2012  . GERD (gastroesophageal reflux disease) 11/28/2012    Jeanie Cooks Gerrod Maule. PT 10/04/2017, 8:05 AM  [facility] [address] [place], [name], [address] Phone: [phone]    Fax:  [phone]  Name: Gabrianna Fassnacht MRN: [id number] Date of Birth: Dec 06, 1955

## 2017-10-06 ENCOUNTER — Ambulatory Visit: Payer: Medicare Other | Admitting: Physical Therapy

## 2017-10-06 ENCOUNTER — Ambulatory Visit: Payer: Medicare Other | Admitting: Occupational Therapy

## 2017-10-08 ENCOUNTER — Ambulatory Visit: Payer: Medicare Other | Admitting: Occupational Therapy

## 2017-10-08 ENCOUNTER — Ambulatory Visit: Payer: Medicare Other | Admitting: Rehabilitative and Restorative Service Providers"

## 2017-10-13 ENCOUNTER — Ambulatory Visit: Payer: Medicare Other | Admitting: Occupational Therapy

## 2017-10-13 ENCOUNTER — Ambulatory Visit: Payer: Medicare Other | Admitting: Physical Therapy

## 2017-10-13 ENCOUNTER — Encounter: Payer: Self-pay | Admitting: Occupational Therapy

## 2017-10-13 ENCOUNTER — Encounter: Payer: Self-pay | Admitting: Physical Therapy

## 2017-10-13 DIAGNOSIS — R2689 Other abnormalities of gait and mobility: Secondary | ICD-10-CM

## 2017-10-13 DIAGNOSIS — R29898 Other symptoms and signs involving the musculoskeletal system: Secondary | ICD-10-CM

## 2017-10-13 DIAGNOSIS — R278 Other lack of coordination: Secondary | ICD-10-CM | POA: Diagnosis not present

## 2017-10-13 DIAGNOSIS — R293 Abnormal posture: Secondary | ICD-10-CM

## 2017-10-13 DIAGNOSIS — R2681 Unsteadiness on feet: Secondary | ICD-10-CM | POA: Diagnosis not present

## 2017-10-13 DIAGNOSIS — R29818 Other symptoms and signs involving the nervous system: Secondary | ICD-10-CM

## 2017-10-13 NOTE — Therapy (Signed)
[facility] [address] [place], [name], [address] Phone: [phone]   Fax:  [phone]  Physical Therapy Treatment  Patient Details  Name: Jacqueline Atkinson MRN: [id number] Date of Birth: 03/22/1956 Referring Provider: Wells Guiles Tat   Encounter Date: 10/13/2017  PT End of Session - 10/13/17 1136    Visit Number  10    Number of Visits  17    Date for PT Re-Evaluation  10/17/17    Authorization Type  [employer]    PT Start Time  0933    PT Stop Time  1014    PT Time Calculation (min)  41 min    Activity Tolerance  Patient tolerated treatment well    Behavior During Therapy  [facility] for tasks assessed/performed       Past Medical History:  Diagnosis Date  . Allergy   . Anemia   . Anxiety   . Arthritis    Left knee  . Edema extremities    lower extremity edema left greater than right.  Marland Kitchen GERD (gastroesophageal reflux disease)   . Heart murmur   . History of hiatal hernia    small  . Hx of seasonal allergies    occ. use OTC Mucinex or Antihistamine.  . Hyperlipidemia   . Hypertension   . Mobility impaired    ambulates with walker, tendency to lose balance.  . 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 Neuromuscular disorder ([place])    "multi system atrophy disease"- "pain, bilateral foot numbness, left knee pain"- Dr. Carles Collet is following.  . 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 ESOPHAGOGASTRODUODENOSCOPY ENDOSCOPY     with esophageal dilation  . FOOT SURGERY  2011   3rd metatarsal LT foot  . KNEE ARTHROSCOPY Left 02/08/2015   Procedure: LEFT KNEE ARTHROSCOPY ;  Surgeon: Latanya Maudlin, MD;  Location: [facility];  Service: Orthopedics;  Laterality: Left;  . TONSILLECTOMY AND ADENOIDECTOMY    . TOTAL KNEE ARTHROPLASTY Left  08/22/2015   Procedure: LEFT TOTAL  KNEE  ARTHROPLASTY;  Surgeon: Latanya Maudlin, MD;  Location: [facility];  Service: Orthopedics;  Laterality: Left;    There were no vitals filed for this visit.  Subjective Assessment - 10/13/17 0937    Subjective  I got a call from the U-step RW last week and I should have it by Lysle Morales.  I can get my leggings and pants on better.  I still feel like I'm tilting backwards even when I'm upright.  Had a fall Friday before last in my closet trying to take a step backwards.    Pertinent History  chronic back pain, L knee replacement 08/23/15 (median ligament crosses over the patella with knee extension-limits WB flexion>ext with standing or riding bike), reflux.    How long can you stand comfortably?  Cannot stand >10-15 minutes before too much hip pain    Patient Stated Goals  Want to be able to stand longer, work on safety with walker, getting in and out of van    Currently in Pain?  Yes    Pain Score  5     Pain Location  Hip    Pain Orientation  Right;Left    Pain Descriptors / Indicators  Aching    Pain Type  Chronic  pain    Pain Onset  More than a month ago    Pain Frequency  Constant    Aggravating Factors   standing    Pain Relieving Factors  sitting, walking, exercises                      [facility] Adult PT Treatment/Exercise - 10/13/17 0001      Ambulation/Gait   Ambulation/Gait  Yes    Ambulation/Gait Assistance  5: Supervision    Ambulation Distance (Feet)  300 Feet then 50 ft with U-step    Assistive device  Other (Comment) U-step RW    Gait Pattern  Step-through pattern;Decreased stride length;Trunk flexed;Poor foot clearance - left;Poor foot clearance - right    Ambulation Surface  Level;Indoor    Pre-Gait Activities  Near counter, with rolling wlaker and then with U-Step RW, multiple reps of forward/back walking, with hinging at hips, with increased L foot clearance and step length using U-step RW.  Pt reports overall  confidence with retro gait with U-step RW.    Gait Comments  Gait x 100 ft, then 20 ft x 2 with rolling walker, with forward flexed posture, cues for slowed pace and upright posture to keep RW close to pt's BOS.          Balance Exercises - 10/13/17 0947      Balance Exercises: Standing   Wall Bumps  Hip    Wall Bumps-Hips  Eyes opened;Anterior/posterior;10 reps 2 sets walker in front    Other Standing Exercises  Stagger stance forward/back wegithshifting x 10 reps each side with UE supported at walker; Facing counter with bilateral UE support-2-3 posterior steps, then forward steps towards counter, multiple reps, with focus to task, then with conversation task; then reviewed HEP for upright posture against wall, with RW in front of patient-pt performs 3 reps.       Self Care:  Discussed previous fall and how to incorporate posture, staggered stance position of feet and retro gait/safe turns into functional activities at home.  Discussed POC, with pt feeling like after this week (LTG check next visit), she would like 1-2 visits when she receives her U-step RW for training.   PT Short Term Goals - 09/26/17 1005      PT SHORT TERM GOAL #1   Title  Pt will be independent with HEP for improved transfers, gait and balance.  TARGET 09/19/17    Baseline  09/26/17     Time  4    Period  Weeks    Status  Achieved      PT SHORT TERM GOAL #2   Title  Pt will improve TUG score to less than or equal to 18 seconds for decreased fall risk.    Baseline  09/26/17 49.19 sec (increased time due to increased bil hip and back pain)    Time  4    Period  Weeks    Status  Not Met      PT SHORT TERM GOAL #3   Title  Pt will improve gait velocity to at least 1.8 ft/sec for improved gait efficiency and safety.    Baseline  09/26/17 1.6 ft/sec    Time  4    Period  Weeks    Status  Not Met      PT SHORT TERM GOAL #4   Title  Pt will verbalize understanding of fall prevention in the home environment.     Baseline  09/26/17 ran  out of time     Time  4    Period  Weeks    Status  Unable to assess      PT SHORT TERM GOAL #5   Title  Patient able to transfer to floor and up off the floor with min assist.    Baseline  09/26/17 deferred due to incr wrist pain    Time  4    Period  Weeks    Status  Deferred        PT Long Term Goals - Oct 31, 2017 1141      PT LONG TERM GOAL #1   Title  Pt will be independent with progression of HEP to address posture, balance, transfers and gait.  TARGET 10/17/17    Time  8    Period  Weeks    Status  Achieved      PT LONG TERM GOAL #2   Title  Pt will improve TUG cognitive score to less than or equal to 18 seconds for decreased fall risk.    Time  8    Period  Weeks    Status  New      PT LONG TERM GOAL #3   Title  Pt will improve gait velocity to at least 2 ft/sec for improved gait efficiency and safety.    Time  8    Period  Weeks    Status  New      PT LONG TERM GOAL #4   Title  Pt will improve 3 minute walk test by 50 ft for improved efficiency and safety with gait.    Baseline  347 ft in 3 minutes at eval    Time  8    Period  Weeks    Status  New      PT LONG TERM GOAL #5   Title  Pt will verbalize plans for ongoing community fitness upon D/C from PT.    Time  8    Period  Weeks    Status  New            Plan - 10/31/2017 1138    Clinical Impression Statement  Pt reports no additional wrist pain today, but focused practice on posture, weightshifting and posterior stepping/retro gait.  Pt's U-Step RW is ordered and may be here in the next few weeks.  She wants to make sure to have a therapy session once her U-step RW has arrived.    Rehab Potential  Good    Clinical Impairments Affecting Rehab Potential  Limitations in L knee (per pt report:  median ligament crosses patella when going flexion>extension and is very painful; pt has adapted, but is limited in weightbearing flex>ext movement patterns)    PT Frequency  2x / week     PT Duration  8 weeks    PT Treatment/Interventions  ADLs/Self Care Home Management;Gait training;DME Instruction;Functional mobility training;Therapeutic activities;Therapeutic exercise;Balance training;Neuromuscular re-education;Patient/family education    PT Next Visit Plan  Check LTGs and will likely renew for 1-2 visits upon pt's receipt of her U-step RW for training.    Consulted and Agree with Plan of Care  Patient       Patient will benefit from skilled therapeutic intervention in order to improve the following deficits and impairments:  Abnormal gait, Decreased balance, Decreased activity tolerance, Decreased mobility, Decreased strength, Difficulty walking, Postural dysfunction  Visit Diagnosis: Other abnormalities of gait and mobility  Abnormal posture  Unsteadiness on feet   G-Codes - 31-Oct-2017  1142    Functional Assessment Tool Used (Outpatient Only)  giat velocity 1.6 ft/sec, able to perform floor transfer with min guard assistance    Functional Limitation  Mobility: Walking and moving around    Mobility: Walking and Moving Around Current Status [phone])  At least 60 percent but less than 80 percent impaired, limited or restricted    Mobility: Walking and Moving Around Goal Status [phone])  At least 40 percent but less than 60 percent impaired, limited or restricted       Problem List Patient Active Problem List   Diagnosis Date Noted  . Dyspnea on exertion 08/05/2017  . Hyperlipidemia 04/23/2016  . Arthritis 04/23/2016  . 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 Postoperative anemia due to acute blood loss 08/29/2015  . 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 Neuromuscular disease ([place]) 09/26/2014  . Gait difficulty 12/29/2013  . Back pain 12/29/2013  . Paresthesia 12/19/2012  . Essential hypertension 11/28/2012  . Polypharmacy 11/28/2012  . GERD (gastroesophageal reflux disease) 11/28/2012    Westly Hinnant W. 10/13/2017, 11:44 AM  Frazier Butt., PT   [facility] [address] [place], [name], [address] Phone: [phone]   Fax:  [phone]  Name: Jacqueline Atkinson MRN: [id number] Date of Birth: [birth year]   Physical Therapy Progress Note  Dates of Reporting Period: 08/18/17 to 10/13/17  Objective Reports of Subjective Statement: Able to get up from floor better with use of yoga block; pt feels more upright and less taxing with gait when using U-step RW  Objective Measurements: gait velocity 1.6 ft/sec; floor>stand with min guard assistance  Goal Update:   PT Short Term Goals - 09/26/17 1005      PT SHORT TERM GOAL #1   Title  Pt will be independent with HEP for improved transfers, gait and balance.  TARGET 09/19/17    Baseline  09/26/17     Time  4    Period  Weeks    Status  Achieved      PT SHORT TERM GOAL #2   Title  Pt will improve TUG score to less than or equal to 18 seconds for decreased fall risk.    Baseline  09/26/17 49.19 sec (increased time due to increased bil hip and back pain)    Time  4    Period  Weeks    Status  Not Met      PT SHORT TERM GOAL #3   Title  Pt will improve gait velocity to at least 1.8 ft/sec for improved gait efficiency and safety.    Baseline  09/26/17 1.6 ft/sec    Time  4    Period  Weeks    Status  Not Met      PT SHORT TERM GOAL #4   Title  Pt will verbalize understanding of fall prevention in the home environment.    Baseline  09/26/17 ran out of time     Time  4    Period  Weeks    Status  Unable to assess      PT SHORT TERM GOAL #5   Title  Patient able to transfer to floor and up off the floor with min assist.    Baseline  09/26/17 deferred due to incr wrist pain    Time  4    Period  Weeks    Status  Deferred      Pt has progressed slowly towards goals, but pt has  been limited due to holding PT for several weeks because of wrist pain.    Plan: Check LTGs next visit, with plans to renew for 1-2  visits for training once her U-step RW arrives.  Reason Skilled Services are Required: At fall risk, has had continued falls in posterior direction; will benefit from specific training with her U-step rolling walker once it arrives.  Mady Haagensen, PT 10/13/17 11:49 AM Phone: [phone] Fax: [phone]

## 2017-10-13 NOTE — Therapy (Signed)
[facility] [address] [place], [name], [address] Phone: [phone]   Fax:  [phone]  Occupational Therapy Treatment  Patient Details  Name: Jacqueline Atkinson MRN: [id number] Date of Birth: 08-16-1956 Referring Provider (Historical): Dr. Wells Guiles Tat    Encounter Date: 10/13/2017  OT End of Session - 10/13/17 1023    Visit Number  10    Number of Visits  17    Date for OT Re-Evaluation  10/17/17    Authorization Type  [employer] Medicare, no visit limit/no auth; G-code needed    Authorization - Visit Number  10    Authorization - Number of Visits  10    OT Start Time  1020    OT Stop Time  1105    OT Time Calculation (min)  45 min    Activity Tolerance  Patient tolerated treatment well    Behavior During Therapy  [facility] for tasks assessed/performed       Past Medical History:  Diagnosis Date  . Allergy   . Anemia   . Anxiety   . Arthritis    Left knee  . Edema extremities    lower extremity edema left greater than right.  Marland Kitchen GERD (gastroesophageal reflux disease)   . Heart murmur   . History of hiatal hernia    small  . Hx of seasonal allergies    occ. use OTC Mucinex or Antihistamine.  . Hyperlipidemia   . Hypertension   . Mobility impaired    ambulates with walker, tendency to lose balance.  . 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 Neuromuscular disorder ([place])    "multi system atrophy disease"- "pain, bilateral foot numbness, left knee pain"- Dr. Carles Collet is following.  . 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 ESOPHAGOGASTRODUODENOSCOPY ENDOSCOPY     with esophageal dilation  . FOOT SURGERY  2011   3rd metatarsal LT foot  . KNEE ARTHROSCOPY Left 02/08/2015   Procedure: LEFT KNEE ARTHROSCOPY ;  Surgeon: Latanya Maudlin, MD;  Location: [facility];  Service: Orthopedics;  Laterality: Left;  . TONSILLECTOMY AND ADENOIDECTOMY    . TOTAL KNEE ARTHROPLASTY Left 08/22/2015   Procedure: LEFT TOTAL  KNEE  ARTHROPLASTY;  Surgeon: Latanya Maudlin, MD;  Location: [facility];  Service: Orthopedics;  Laterality: Left;    There were no vitals filed for this visit.  Subjective Assessment - 10/13/17 1021    Subjective   fell backing up in closet.  Planning to get U-step walker.  Shelf liner helped to pick up meds and built-up grips also help    Pertinent History  Multiple Systems Atrophy (MSA); L knee replacement 2016; HTN; migraines; dyspnea on exertion; hyperlipidemia    Limitations  **check BP; fall risk    Patient Stated Goals  improve use of R hand for painting, writing    Currently in Pain?  Yes    Pain Score  5     Pain Location  Hip    Pain Orientation  Right;Left    Pain Descriptors / Indicators  Aching    Pain Type  Chronic pain    Pain Onset  More than a month ago    Pain Frequency  Constant    Aggravating Factors   standing    Pain Relieving Factors  tylenol, sitting, walking          Reviewed use of shelf liner to help pick up pills.  Pt reports that this helps.  Discussed strategies for LB dressing including:  Waiting prior to dressing to allow time for medication to begin to work in am, Simple stretching prior to dressing, long-handled shoe horn (pt able to return demo use after min cueing/instruction) or shoe funnel.  Pt verbalized understanding.  Discussed strategies for UB dressing including:  Putting RUE in prior to LUE, good posture, position of material to keep shirt/dress from rolling.  Also practiced donning/doffing jacket with use of strategy (pulling on RUE first, grabbing collar to pull around before putting in LUE and making sure collar is turned out first).  Pt verbalized understanding.  Discussed strategies for painting:  PWR! Hands, performing for set amount of time and stopping before  having difficulty, timing of day with medications, and making it the priority of the day.  Pt verbalized understanding.  Discussed meal planning (pt has performed in past), sitting on RW during cooking, use of crock pot.  Pt verbalized understanding.                  OT Education - 10/13/17 1516    Education Details  Strategies for ADLs--see note for details    Person(s) Educated  Patient    Methods  Explanation;Demonstration    Comprehension  Verbalized understanding       OT Short Term Goals - 10/13/17 1518      OT SHORT TERM GOAL #1   Title  Pt will be independent with updated HEP.--check STGs 09/17/17    Status  Achieved      OT SHORT TERM GOAL #2   Title  Pt will improve RUE coordination/functional reaching as shown by improving score on box and blocks test by at least 4 blocks.    Status  On-going 36 blocks      OT SHORT TERM GOAL #3   Title  Pt will report incr ease with LB dressing using strategies/AE prn.    Status  On-going 10/13/17:  instructed in strategies/AE, with incr ease in clinic      OT [place] #4   Title  Pt will verbalize understanding of appropriate community resources and ways to prevent future complications.    Status  On-going      OT SHORT TERM GOAL #5   Title  Pt will verbalize understanding of compensatory strategies to minimize hand cramps, fatigue and maintain legibility of handwriting.    Status  Achieved        OT Long Term Goals - 10/13/17 1519      OT LONG TERM GOAL #1   Title  Pt will verbalize understanding of adaptive strategies/AE for ADLs/IADLs (including strategies for putting on make-up, donning/doffing bathing suit and leggings, eating, writing, painting).--check LTGs 10/17/17    Time  8    Period  Weeks    Status  On-going 10/13/17 educated, but may need review      OT LONG TERM GOAL #2   Title  Pt will improve RUE coordination/functional reaching as shown by improving score on box and blocks test by at  least 8 blocks.    Baseline  R-42 blocks    Time  8    Period  Weeks    Status  New      OT LONG TERM GOAL #3   Title  Pt will improve coordination for  ADLs as shown by improving time on 9-hole peg test by at least 5sec with RUE.    Baseline  R-37.71sec, L-31.34sec    Time  8    Period  Weeks    Status  New      OT LONG TERM GOAL #4   Title  Pt will report incr ease with writing/painting.    Time  8    Period  Weeks    Status  On-going October 24, 2017:  has not attempted painting lately      OT [place] #5   Title  Pt will report using RUE as dominant UE for eating at least 90% of the time.    Time  8    Period  Weeks    Status  On-going            Plan - 10/24/17 1024    Clinical Impression Statement  Pt is progressing towards goals for ADL strategies.  However, pt would benefit from additional review of adaptive strategies/AE and trial as able.      Rehab Potential  Good    OT Frequency  2x / week    OT Duration  8 weeks    OT Treatment/Interventions  Self-care/ADL training;Neuromuscular education;Therapeutic exercise;Therapeutic activities    Plan  check remaining goals, review community resources and strategies/AE for ADLs    OT Home Exercise Plan  issued PWR! hands, PWR! supine up, rock and twist, tripod grip for pen, cordination HEP    Consulted and Agree with Plan of Care  Patient       Patient will benefit from skilled therapeutic intervention in order to improve the following deficits and impairments:  Decreased knowledge of use of DME, Decreased mobility, Decreased strength, Impaired UE functional use, Decreased balance, Decreased activity tolerance, Impaired tone, Improper spinal/pelvic alignment, Decreased endurance, Decreased coordination, Decreased range of motion  Visit Diagnosis: Other symptoms and signs involving the nervous system  Other symptoms and signs involving the musculoskeletal system  Other lack of coordination  Unsteadiness on  feet  Other abnormalities of gait and mobility  Abnormal posture  G-Codes - 10-24-17 1523    Functional Assessment Tool Used (Outpatient only)  Box and blocks test:  R-36 blocks.      Functional Limitation  Carrying, moving and handling objects    Carrying, Moving and Handling Objects Current Status [phone])  At least 40 percent but less than 60 percent impaired, limited or restricted    Carrying, Moving and Handling Objects Goal Status (R1540)  At least 20 percent but less than 40 percent impaired, limited or restricted       Problem List Patient Active Problem List   Diagnosis Date Noted  . Dyspnea on exertion 08/05/2017  . Hyperlipidemia 04/23/2016  . Arthritis 04/23/2016  . 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 Postoperative anemia due to acute blood loss 08/29/2015  . 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 Neuromuscular disease ([place]) 09/26/2014  . Gait difficulty 12/29/2013  . Back pain 12/29/2013  . Paresthesia 12/19/2012  . Essential hypertension 11/28/2012  . Polypharmacy 11/28/2012  . GERD (gastroesophageal reflux disease) 11/28/2012   Occupational Therapy Progress Note  Dates of Reporting Period: 08/18/17 to 24-Oct-2017  Objective Reports of Subjective Statement: see above  Objective Measurements: see above  Goal Update: see above  Plan: see above  Reason Skilled Services are Required: see above      [facility] 10/24/17, 5:41 PM  [facility]  [address] [place], [name], [address] Phone: [phone]   Fax:  [phone]  Name: Jacqueline Atkinson MRN: [id number] Date of Birth: 02/10/1956   Vianne Bulls, OTR/L [facility] [address] [place], [place]  [address] [phone] phone [phone] 10/13/17 5:41 PM

## 2017-10-15 ENCOUNTER — Encounter: Payer: Self-pay | Admitting: Physical Therapy

## 2017-10-15 ENCOUNTER — Ambulatory Visit: Payer: Medicare Other | Admitting: Physical Therapy

## 2017-10-15 ENCOUNTER — Ambulatory Visit: Payer: Medicare Other | Admitting: Occupational Therapy

## 2017-10-15 DIAGNOSIS — R2681 Unsteadiness on feet: Secondary | ICD-10-CM

## 2017-10-15 DIAGNOSIS — R278 Other lack of coordination: Secondary | ICD-10-CM

## 2017-10-15 DIAGNOSIS — R293 Abnormal posture: Secondary | ICD-10-CM | POA: Diagnosis not present

## 2017-10-15 DIAGNOSIS — R2689 Other abnormalities of gait and mobility: Secondary | ICD-10-CM | POA: Diagnosis not present

## 2017-10-15 DIAGNOSIS — G903 Multi-system degeneration of the autonomic nervous system: Secondary | ICD-10-CM | POA: Diagnosis not present

## 2017-10-15 DIAGNOSIS — R29818 Other symptoms and signs involving the nervous system: Secondary | ICD-10-CM

## 2017-10-15 DIAGNOSIS — R29898 Other symptoms and signs involving the musculoskeletal system: Secondary | ICD-10-CM | POA: Diagnosis not present

## 2017-10-15 NOTE — Patient Instructions (Addendum)
(  Exercise) Monday Tuesday Wednesday Thursday Friday Saturday Sunday                                                                                                             

## 2017-10-15 NOTE — Therapy (Signed)
[facility] [address] [place], [name], [address] Phone: [phone]   Fax:  [phone]  Physical Therapy Treatment  Patient Details  Name: Jacqueline Atkinson MRN: [id number] Date of Birth: 1955/11/11 Referring Provider: Wells Guiles Tat   Encounter Date: 10/15/2017  PT End of Session - 10/15/17 1848    Visit Number  11    Number of Visits  17    Date for PT Re-Evaluation  10/17/17    Authorization Type  [employer]    PT Start Time  0935    PT Stop Time  1015    PT Time Calculation (min)  40 min    Activity Tolerance  Patient tolerated treatment well    Behavior During Therapy  [facility] for tasks assessed/performed       Past Medical History:  Diagnosis Date  . Allergy   . Anemia   . Anxiety   . Arthritis    Left knee  . Edema extremities    lower extremity edema left greater than right.  Marland Kitchen GERD (gastroesophageal reflux disease)   . Heart murmur   . History of hiatal hernia    small  . Hx of seasonal allergies    occ. use OTC Mucinex or Antihistamine.  . Hyperlipidemia   . Hypertension   . Mobility impaired    ambulates with walker, tendency to lose balance.  . 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 Neuromuscular disorder ([place])    "multi system atrophy disease"- "pain, bilateral foot numbness, left knee pain"- Dr. Carles Collet is following.  . 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 ESOPHAGOGASTRODUODENOSCOPY ENDOSCOPY     with esophageal dilation  . FOOT SURGERY  2011   3rd metatarsal LT foot  . KNEE ARTHROSCOPY Left 02/08/2015   Procedure: LEFT KNEE ARTHROSCOPY ;  Surgeon: Latanya Maudlin, MD;  Location: [facility];  Service: Orthopedics;  Laterality: Left;  . TONSILLECTOMY AND ADENOIDECTOMY    . TOTAL KNEE ARTHROPLASTY Left  08/22/2015   Procedure: LEFT TOTAL  KNEE  ARTHROPLASTY;  Surgeon: Latanya Maudlin, MD;  Location: [facility];  Service: Orthopedics;  Laterality: Left;    There were no vitals filed for this visit.  Subjective Assessment - 10/15/17 0939    Subjective  Has not fallen. Is practiciing getting down to and up from floor and doing well. When she fell previously in her bedroom she was able to get herself up.     Pertinent History  chronic back pain, L knee replacement 08/23/15 (median ligament crosses over the patella with knee extension-limits WB flexion>ext with standing or riding bike), reflux.    How long can you stand comfortably?  Cannot stand >10-15 minutes before too much hip pain    Patient Stated Goals  Want to be able to stand longer, work on safety with walker, getting in and out of van    Currently in Pain?  Yes    Pain Score  3     Pain Location  Hip    Pain Orientation  Left;Right    Pain Descriptors / Indicators  Aching    Pain Type  Chronic pain    Pain Onset  More than a month ago    Pain Frequency  Constant  [facility] PT Treatment/Exercise - 10/15/17 0001      Transfers   Transfers  Sit to Stand;Stand to Sit;Floor to Transfer    Sit to Stand  6: Modified independent (Device/Increase time);With upper extremity assist;From bed;From chair/3-in-1;With armrests    Stand to Sit  6: Modified independent (Device/Increase time)    Floor to Transfer  6: Modified independent (Device/Increase time)    Floor to Transfer Details (indicate cue type and reason)  hands on mat table, placing her lt knee on a yoga block for comfort (prior knee surgery) and to tall kneeling; the to 1/2 kneeling and using UEs on mat table, presses up to stand 3 reps      Ambulation/Gait   Ambulation/Gait  Yes    Ambulation/Gait Assistance  5: Supervision 5    Ambulation/Gait Assistance Details  vc for upright posture    Ambulation Distance (Feet)  417 Feet 3 minute walk test     Assistive device  Rolling walker    Gait Pattern  Step-through pattern;Decreased stride length;Trunk flexed;Poor foot clearance - left;Poor foot clearance - right    Gait velocity  20/8.84=2.26 ft/sec      Timed Up and Go Test   TUG  Normal TUG;Cognitive TUG    Normal TUG (seconds)  21.25 with RW    Cognitive TUG (seconds)  22.28 subtract by 3's; only 5% difference from TUG normal             PT Education - 10/15/17 1848    Education provided  Yes    Education Details  Way she can use exercise chart to organize her HEP and/or to track her exercises; results of re-assessment    Person(s) Educated  Patient    Methods  Explanation;Demonstration;Handout    Comprehension  Verbalized understanding       PT Short Term Goals - 09/26/17 1005      PT SHORT TERM GOAL #1   Title  Pt will be independent with HEP for improved transfers, gait and balance.  TARGET 09/19/17    Baseline  09/26/17     Time  4    Period  Weeks    Status  Achieved      PT SHORT TERM GOAL #2   Title  Pt will improve TUG score to less than or equal to 18 seconds for decreased fall risk.    Baseline  09/26/17 49.19 sec (increased time due to increased bil hip and back pain)    Time  4    Period  Weeks    Status  Not Met      PT SHORT TERM GOAL #3   Title  Pt will improve gait velocity to at least 1.8 ft/sec for improved gait efficiency and safety.    Baseline  09/26/17 1.6 ft/sec    Time  4    Period  Weeks    Status  Not Met      PT SHORT TERM GOAL #4   Title  Pt will verbalize understanding of fall prevention in the home environment.    Baseline  09/26/17 ran out of time     Time  4    Period  Weeks    Status  Unable to assess      PT SHORT TERM GOAL #5   Title  Patient able to transfer to floor and up off the floor with min assist.    Baseline  09/26/17 deferred due to incr wrist pain    Time  4    Period  Weeks    Status  Deferred        PT Long Term Goals - 10/15/17 1910      PT  LONG TERM GOAL #1   Title  Pt will be independent with progression of HEP to address posture, balance, transfers and gait.  TARGET 10/17/17    Time  8    Period  Weeks    Status  Achieved      PT LONG TERM GOAL #2   Title  Pt will improve TUG cognitive score to less than or equal to 18 seconds for decreased fall risk.    Baseline  10/15/17 22.28 seconds, however was improved and within 5% of TUG normal    Time  8    Period  Weeks    Status  Partially Met      PT LONG TERM GOAL #3   Title  Pt will improve gait velocity to at least 2 ft/sec for improved gait efficiency and safety.    Baseline  10/15/17 2.26 ft/sec    Time  8    Period  Weeks    Status  Achieved      PT LONG TERM GOAL #4   Title  Pt will improve 3 minute walk test by 50 ft for improved efficiency and safety with gait.    Baseline  347 ft in 3 minutes at eval; 10/15/17 417 ft    Time  8    Period  Weeks    Status  Achieved      PT LONG TERM GOAL #5   Title  Pt will verbalize plans for ongoing community fitness upon D/C from PT.    Baseline  10/15/17 [facility] to [facility] [employer] [facility] to use the pool and continue her HEP    Time  8    Period  Weeks    Status  Achieved            Plan - 10/15/17 1910    Clinical Impression Statement  Session focused on assessing patient's progress towards her LTGs. Patient achieved 4 of 5 goals and the 5th goal was partially met. She is very pleased with her progress (especially that her gait velocity of 2.26 ft/sec is above the threshold of 1.8 ft/sec or higher fall risk category). Testing was completed with her 2 wheel RW as she has not yet received her U-step walker for home use. At end of the session, she received a call from the local dealer that the walker has arrived and will be delivered to her home today. One final appointment scheduled for patient to bring in her new walker for adjustment and training.     Rehab Potential  Good    Clinical Impairments Affecting Rehab  Potential  Limitations in L knee (per pt report:  median ligament crosses patella when going flexion>extension and is very painful; pt has adapted, but is limited in weightbearing flex>ext movement patterns)    PT Frequency  2x / week    PT Duration  8 weeks    PT Treatment/Interventions  ADLs/Self Care Home Management;Gait training;DME Instruction;Functional mobility training;Therapeutic activities;Therapeutic exercise;Balance training;Neuromuscular re-education;Patient/family education    PT Next Visit Plan  Check fit of new U-step walker and complete instruction in use.    Consulted and Agree with Plan of Care  Patient       Patient will benefit from skilled therapeutic intervention in order to improve the following deficits and impairments:  Abnormal gait, Decreased balance, Decreased activity tolerance, Decreased mobility, Decreased strength, Difficulty walking, Postural dysfunction  Visit Diagnosis: Other symptoms and signs involving the nervous system  Unsteadiness on feet  Other abnormalities of gait and mobility  Abnormal posture     Problem List Patient Active Problem List   Diagnosis Date Noted  . Dyspnea on exertion 08/05/2017  . Hyperlipidemia 04/23/2016  . Arthritis 04/23/2016  . 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 Postoperative anemia due to acute blood loss 08/29/2015  . 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 Neuromuscular disease ([place]) 09/26/2014  . Gait difficulty 12/29/2013  . Back pain 12/29/2013  . Paresthesia 12/19/2012  . Essential hypertension 11/28/2012  . Polypharmacy 11/28/2012  . GERD (gastroesophageal reflux disease) 11/28/2012    Rexanne Mano, PT 10/15/2017, 7:19 PM  [facility] [address] [place], [name], [address] Phone: [phone]   Fax:  [phone]  Name: Jacqueline Atkinson MRN: [id number] Date of Birth: [birth year]

## 2017-10-16 NOTE — Therapy (Signed)
[facility] [address] [place], [name], [address] Phone: [phone]   Fax:  [phone]  Occupational Therapy Treatment  Patient Details  Name: Jacqueline Atkinson MRN: [id number] Date of Birth: 21-Apr-1956 Referring Provider (Historical): Dr. Wells Guiles Tat    Encounter Date: 10/15/2017  OT End of Session - 10/15/17 1320    Visit Number  11    Number of Visits  17    Date for OT Re-Evaluation  10/17/17    Authorization Type  [employer] Medicare, no visit limit/no auth; G-code needed    Authorization - Visit Number  11    Authorization - Number of Visits  20    OT Start Time  1020    OT Stop Time  1100    OT Time Calculation (min)  40 min    Activity Tolerance  Patient tolerated treatment well    Behavior During Therapy  [facility] for tasks assessed/performed       Past Medical History:  Diagnosis Date  . Allergy   . Anemia   . Anxiety   . Arthritis    Left knee  . Edema extremities    lower extremity edema left greater than right.  Marland Kitchen GERD (gastroesophageal reflux disease)   . Heart murmur   . History of hiatal hernia    small  . Hx of seasonal allergies    occ. use OTC Mucinex or Antihistamine.  . Hyperlipidemia   . Hypertension   . Mobility impaired    ambulates with walker, tendency to lose balance.  . 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 Neuromuscular disorder ([place])    "multi system atrophy disease"- "pain, bilateral foot numbness, left knee pain"- Dr. Carles Collet is following.  . 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 ESOPHAGOGASTRODUODENOSCOPY ENDOSCOPY     with esophageal dilation  . FOOT SURGERY  2011   3rd metatarsal LT foot  . KNEE ARTHROSCOPY Left 02/08/2015   Procedure: LEFT KNEE ARTHROSCOPY ;  Surgeon: Latanya Maudlin, MD;  Location: [facility];  Service: Orthopedics;  Laterality: Left;  . TONSILLECTOMY AND ADENOIDECTOMY    . TOTAL KNEE ARTHROPLASTY Left 08/22/2015   Procedure: LEFT TOTAL  KNEE  ARTHROPLASTY;  Surgeon: Latanya Maudlin, MD;  Location: [facility];  Service: Orthopedics;  Laterality: Left;    There were no vitals filed for this visit.  Subjective Assessment - 10/15/17 1034    Pertinent History  Multiple Systems Atrophy (MSA); L knee replacement 2016; HTN; migraines; dyspnea on exertion; hyperlipidemia    Limitations  **check BP; fall risk    Patient Stated Goals  improve use of R hand for painting, writing    Currently in Pain?  Yes    Pain Location  Hip    Pain Orientation  Left;Right    Pain Descriptors / Indicators  Aching    Pain Type  Chronic pain    Pain Onset  More than a month ago    Pain Frequency  Constant    Aggravating Factors   standing    Pain Relieving Factors  tylenol, sitting    Multiple Pain Sites  No             Treatment: Reviewed ADL strategies and checked progress towards goals. Education completed regarding ways to prevent  future complications, and appropriate community resources.  PWR! exercise class, cycling or swimming recommended, box was not recommended due to pt's rigidity and risk for injury to RUE. Pt agrees with plans for d/c.                OT Short Term Goals - 10/15/17 1040      OT SHORT TERM GOAL #1   Title  Pt will be independent with updated HEP.--check STGs 09/17/17    Status  Achieved      OT SHORT TERM GOAL #2   Title  Pt will improve RUE coordination/functional reaching as shown by improving score on box and blocks test by at least 4 blocks.    Status  Achieved 47      OT SHORT TERM GOAL #3   Title  Pt will report incr ease with LB dressing using strategies/AE prn.    Status  Achieved 10/13/17:  instructed in strategies/AE, with incr ease in clinic      OT [place] #4   Title  Pt will verbalize  understanding of appropriate community resources and ways to prevent future complications.    Status  Achieved      OT SHORT TERM GOAL #5   Title  Pt will verbalize understanding of compensatory strategies to minimize hand cramps, fatigue and maintain legibility of handwriting.    Status  Achieved        OT Long Term Goals - 10/15/17 1035      OT LONG TERM GOAL #1   Title  Pt will verbalize understanding of adaptive strategies/AE for ADLs/IADLs (including strategies for putting on make-up, donning/doffing bathing suit and leggings, eating, writing, painting).--check LTGs 10/17/17    Status  Achieved      OT LONG TERM GOAL #2   Title  Pt will improve RUE coordination/functional reaching as shown by improving score on box and blocks test by at least 8 blocks.    Status  Not Met 47      OT LONG TERM GOAL #3   Title  Pt will improve coordination for ADLs as shown by improving time on 9-hole peg test by at least 5sec with RUE.    Status  Achieved 25.62 secs      OT LONG TERM GOAL #4   Title  Pt will report incr ease with writing/painting.    Status  Partially Met increased ease with writing, has not attempted painting      OT LONG TERM GOAL #5   Title  Pt will report using RUE as dominant UE for eating at least 90% of the time.    Status  Unable to assess not re-tested            Plan - 10/16/17 1707    Clinical Impression Statement  Pt verbalized understanding of adapted strategies for ADLS. Pt agrees with d/c    Occupational Profile and client history currently impacting functional performance  Pt is a 61 y.o. female with Multiple Systems Atrophy who reports decline in function over the last year.  Pt with PMH that includes:  L knee replacement 2016; HTN; migraines; dyspnea on exertion; hyperlipidemia.  Pt reports decr dominant RUE functional use for ADLs and leisure activities affecting quality of life.      Occupational performance deficits (Please refer to evaluation for  details):  ADL's;IADL's;Leisure;Social Participation;Rest and Sleep    Rehab Potential  Good    OT Frequency  2x / week  OT Duration  8 weeks    OT Treatment/Interventions  Self-care/ADL training;Neuromuscular education;Therapeutic exercise;Therapeutic activities    Plan  Pt agrees with d/c, pt to return for an eval in 6 mons    OT Home Exercise Plan  issued PWR! hands, PWR! supine up, rock and twist, tripod grip for pen, cordination HEP    Consulted and Agree with Plan of Care  Patient       Patient will benefit from skilled therapeutic intervention in order to improve the following deficits and impairments:  Decreased knowledge of use of DME, Decreased mobility, Decreased strength, Impaired UE functional use, Decreased balance, Decreased activity tolerance, Impaired tone, Improper spinal/pelvic alignment, Decreased endurance, Decreased coordination, Decreased range of motion  Visit Diagnosis: Other symptoms and signs involving the nervous system  Other symptoms and signs involving the musculoskeletal system  Other lack of coordination  Abnormal posture  G-Codes - 15-Nov-2017 1717    Functional Assessment Tool Used (Outpatient only)  Box and blocks test:  R-47 blocks    Functional Limitation  Carrying, moving and handling objects    Carrying, Moving and Handling Objects Goal Status (Z5638)  At least 20 percent but less than 40 percent impaired, limited or restricted    Carrying, Moving and Handling Objects Discharge Status [phone])  At least 20 percent but less than 40 percent impaired, limited or restricted      OCCUPATIONAL THERAPY DISCHARGE SUMMARY   Current functional level related to goals / functional outcomes: Pt made good overall progress. See progress towards goals.   Remaining deficits: Bradykinesia, decreased coordination, decreased balance, rigidity,    Education / Equipment: Pt was educated regarding :adapted strategies for ADLS, community resources and ways to  prevent future complications. Pt verbalized understanding.  Plan: Patient agrees to discharge.  Patient goals were partially met. Patient is being discharged due to being pleased with the current functional level.  ?????     Problem List Patient Active Problem List   Diagnosis Date Noted  . Dyspnea on exertion 08/05/2017  . Hyperlipidemia 04/23/2016  . Arthritis 04/23/2016  . 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 Postoperative anemia due to acute blood loss 08/29/2015  . 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 Neuromuscular disease ([place]) 09/26/2014  . Gait difficulty 12/29/2013  . Back pain 12/29/2013  . Paresthesia 12/19/2012  . Essential hypertension 11/28/2012  . Polypharmacy 11/28/2012  . GERD (gastroesophageal reflux disease) 11/28/2012    RINE,KATHRYN 15-Nov-2017, 5:18 PM Theone Murdoch, OTR/L [phone] Phone: [phone] 5:19 PM 11-15-17 [facility] [address] [place], [name], [address] Phone: [phone]   Fax:  [phone]  Name: Jacqueline Atkinson MRN: [id number] Date of Birth: April 18, 1956

## 2017-10-17 ENCOUNTER — Encounter: Payer: Self-pay | Admitting: Physical Therapy

## 2017-10-17 ENCOUNTER — Ambulatory Visit: Payer: Medicare Other | Admitting: Physical Therapy

## 2017-10-17 DIAGNOSIS — R278 Other lack of coordination: Secondary | ICD-10-CM | POA: Diagnosis not present

## 2017-10-17 DIAGNOSIS — R2689 Other abnormalities of gait and mobility: Secondary | ICD-10-CM | POA: Diagnosis not present

## 2017-10-17 DIAGNOSIS — R29818 Other symptoms and signs involving the nervous system: Secondary | ICD-10-CM | POA: Diagnosis not present

## 2017-10-17 DIAGNOSIS — R293 Abnormal posture: Secondary | ICD-10-CM | POA: Diagnosis not present

## 2017-10-17 DIAGNOSIS — R29898 Other symptoms and signs involving the musculoskeletal system: Secondary | ICD-10-CM | POA: Diagnosis not present

## 2017-10-17 DIAGNOSIS — R2681 Unsteadiness on feet: Secondary | ICD-10-CM | POA: Diagnosis not present

## 2017-10-17 NOTE — Therapy (Signed)
[facility] [address] [place], [name], [address] Phone: [phone]   Fax:  [phone]  Physical Therapy Treatment Renewal/Discharge Visit  Patient Details  Name: Jacqueline Atkinson MRN: [id number] Date of Birth: January 06, 1956 Referring Provider: Wells Guiles Tat   Encounter Date: 10/17/2017  PT End of Session - 10/17/17 1556    Visit Number  12    Number of Visits  17    Date for PT Re-Evaluation  10/17/17    Authorization Type  [employer]    PT Start Time  0847    PT Stop Time  0913    PT Time Calculation (min)  26 min    Activity Tolerance  Patient tolerated treatment well    Behavior During Therapy  [facility] for tasks assessed/performed       Past Medical History:  Diagnosis Date  . Allergy   . Anemia   . Anxiety   . Arthritis    Left knee  . Edema extremities    lower extremity edema left greater than right.  Marland Kitchen GERD (gastroesophageal reflux disease)   . Heart murmur   . History of hiatal hernia    small  . Hx of seasonal allergies    occ. use OTC Mucinex or Antihistamine.  . Hyperlipidemia   . Hypertension   . Mobility impaired    ambulates with walker, tendency to lose balance.  . 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 Neuromuscular disorder ([place])    "multi system atrophy disease"- "pain, bilateral foot numbness, left knee pain"- Dr. Carles Collet is following.  . 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 ESOPHAGOGASTRODUODENOSCOPY ENDOSCOPY     with esophageal dilation  . FOOT SURGERY  2011   3rd metatarsal LT foot  . KNEE ARTHROSCOPY Left 02/08/2015   Procedure: LEFT KNEE ARTHROSCOPY ;  Surgeon: Latanya Maudlin, MD;  Location: [facility];  Service: Orthopedics;  Laterality: Left;  . TONSILLECTOMY AND ADENOIDECTOMY    .  TOTAL KNEE ARTHROPLASTY Left 08/22/2015   Procedure: LEFT TOTAL  KNEE  ARTHROPLASTY;  Surgeon: Latanya Maudlin, MD;  Location: [facility];  Service: Orthopedics;  Laterality: Left;    There were no vitals filed for this visit.  Subjective Assessment - 10/17/17 0850    Subjective  Received my new U-Step Rolling walker-but issues would be getting out of my house and into the [place].  Have a ramp to the back porch, but it's blocked by downed trees and broken fence.  I need to look into better ramp for my home.    Pertinent History  chronic back pain, L knee replacement 08/23/15 (median ligament crosses over the patella with knee extension-limits WB flexion>ext with standing or riding bike), reflux.    How long can you stand comfortably?  Cannot stand >10-15 minutes before too much hip pain    Patient Stated Goals  Want to be able to stand longer, work on safety with walker, getting in and out of van    Currently in Pain?  Yes    Pain Score  2     Pain Location  Hip    Pain Orientation  Left;Right    Pain Descriptors / Indicators  Aching    Pain Type  Chronic pain    Pain Onset  More  than a month ago    Pain Frequency  Constant    Aggravating Factors   standing    Pain Relieving Factors  walking upright lessens pain                      [facility] Adult PT Treatment/Exercise - 10/17/17 0001      Ambulation/Gait   Ambulation/Gait  Yes    Ambulation/Gait Assistance  5: Supervision    Ambulation Distance (Feet)  230 Feet    Assistive device  -- U-Step RW    Gait Pattern  Step-through pattern;Decreased stride length;Poor foot clearance - left;Poor foot clearance - right More upright posture, not as fatigued with U-step    Gait Comments  Looked at height of new U-step RW and it appears correct.  Discussed adjusting one notch higher if needed, to prevent excess forward lean with gait.  Cues provided to stay close within BOS.      Self-Care   Self-Care  Other Self-Care Comments    Other  Self-Care Comments   Showed patient how to fold U-step RW, but discussed limitations of patient not being able to safely lift it in and out of van.  Recommended 1)use the van ramp that she has now (but concern is when her scooter is also in back of [place]).  or 2) have family member assist with putting U-step in and out of van.  Also discussed safe negotiation of stairs/family member assist to place U-step where needed for stair negotiation until she can get better ramp for home (current one is in disrepair due to downed trees and fence).  Provided patient with informaiton on Independent Living through [facility] as resource for ramp building for home.  Discussed return PT, OT, and speech screens in 6 month.  Pt in agreement with d/c today.      Gait activities-forward/back walking using U-step RW with supervision, then turning 180 degrees, x 2 reps, with U-step RW.  Self Care:  Discussed fall prevention in relation to U-step RW use.     PT Education - 10/17/17 1555    Education provided  Yes    Education Details  U-Step adjustments, folding, use of ramp/family assist until ramp can be built for home; provided info on [place] for ramp assistance    Person(s) Educated  Patient    Methods  Explanation;Handout;Demonstration    Comprehension  Verbalized understanding;Returned demonstration       PT Short Term Goals - 09/26/17 1005      PT SHORT TERM GOAL #1   Title  Pt will be independent with HEP for improved transfers, gait and balance.  TARGET 09/19/17    Baseline  09/26/17     Time  4    Period  Weeks    Status  Achieved      PT SHORT TERM GOAL #2   Title  Pt will improve TUG score to less than or equal to 18 seconds for decreased fall risk.    Baseline  09/26/17 49.19 sec (increased time due to increased bil hip and back pain)    Time  4    Period  Weeks    Status  Not Met      PT SHORT TERM GOAL #3   Title  Pt will improve gait velocity to at least 1.8 ft/sec for improved  gait efficiency and safety.    Baseline  09/26/17 1.6 ft/sec    Time  4  Period  Weeks    Status  Not Met      PT SHORT TERM GOAL #4   Title  Pt will verbalize understanding of fall prevention in the home environment.    Baseline  09/26/17 ran out of time     Time  4    Period  Weeks    Status  Unable to assess      PT SHORT TERM GOAL #5   Title  Patient able to transfer to floor and up off the floor with min assist.    Baseline  09/26/17 deferred due to incr wrist pain    Time  4    Period  Weeks    Status  Deferred        PT Long Term Goals - 10/17/17 1559      PT LONG TERM GOAL #1   Title  Pt will be independent with progression of HEP to address posture, balance, transfers and gait.  TARGET 10/17/17    Time  8    Period  Weeks    Status  Achieved      PT LONG TERM GOAL #2   Title  Pt will improve TUG cognitive score to less than or equal to 18 seconds for decreased fall risk.    Baseline  10/15/17 22.28 seconds, however was improved and within 5% of TUG normal    Time  8    Period  Weeks    Status  Partially Met      PT LONG TERM GOAL #3   Title  Pt will improve gait velocity to at least 2 ft/sec for improved gait efficiency and safety.    Baseline  10/15/17 2.26 ft/sec    Time  8    Period  Weeks    Status  Achieved      PT LONG TERM GOAL #4   Title  Pt will improve 3 minute walk test by 50 ft for improved efficiency and safety with gait.    Baseline  347 ft in 3 minutes at eval; 10/15/17 417 ft    Time  8    Period  Weeks    Status  Achieved      PT LONG TERM GOAL #5   Title  Pt will verbalize plans for ongoing community fitness upon D/C from PT.    Baseline  10/15/17 Plans to return to [facility] center to use the pool and continue her HEP    Time  8    Period  Weeks    Status  Achieved      Additional Long Term Goals   Additional Long Term Goals  Yes      PT LONG TERM GOAL #6   Title  Pt will verbalize/demo understanding of use of personal U-step  RW, for improved gait efficiency and safety.    Time  1    Period  Days    Status  Achieved    Target Date  10/17/17            Plan - 10/17/17 1556    Clinical Impression Statement  Additional visit added to this POC, today, for patient to go over any questions, concerns, adjustments, as she has received her U-step.  She appears safe with its use with gait  and turns.  Discussed options for safely getting in and out of home/van and provided informaiton for ramp assistance for home.  Pt is appropriate for discharge at this time.  Rehab Potential  Good    Clinical Impairments Affecting Rehab Potential  Limitations in L knee (per pt report:  median ligament crosses patella when going flexion>extension and is very painful; pt has adapted, but is limited in weightbearing flex>ext movement patterns)    PT Frequency  2x / week 1 additional visit 10/20/2017 for instruction in [place] RW-recert    PT Duration  8 weeks    PT Treatment/Interventions  ADLs/Self Care Home Management;Gait training;DME Instruction;Functional mobility training;Therapeutic activities;Therapeutic exercise;Balance training;Neuromuscular re-education;Patient/family education    PT Next Visit Plan  Discharge this visit.  Plan for return PT, OT, and speech screens in 6 months.    Consulted and Agree with Plan of Care  Patient       Patient will benefit from skilled therapeutic intervention in order to improve the following deficits and impairments:  Abnormal gait, Decreased balance, Decreased activity tolerance, Decreased mobility, Decreased strength, Difficulty walking, Postural dysfunction  Visit Diagnosis: Other abnormalities of gait and mobility  Unsteadiness on feet  Other symptoms and signs involving the nervous system   G-Codes - 10/20/2017 1601    Functional Assessment Tool Used (Outpatient Only)  gait velocity 2.6 ft/sec, 417 ft in 3 minute walk     Functional Limitation  Mobility: Walking and moving around     Mobility: Walking and Moving Around Goal Status [phone])  At least 40 percent but less than 60 percent impaired, limited or restricted    Mobility: Walking and Moving Around Discharge Status [phone])  At least 40 percent but less than 60 percent impaired, limited or restricted       Problem List Patient Active Problem List   Diagnosis Date Noted  . Dyspnea on exertion 08/05/2017  . Hyperlipidemia 04/23/2016  . Arthritis 04/23/2016  . 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 Postoperative anemia due to acute blood loss 08/29/2015  . 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 Neuromuscular disease ([place]) 09/26/2014  . Gait difficulty 12/29/2013  . Back pain 12/29/2013  . Paresthesia 12/19/2012  . Essential hypertension 11/28/2012  . Polypharmacy 11/28/2012  . GERD (gastroesophageal reflux disease) 11/28/2012    Jerrilyn Messinger W. 20-Oct-2017, 4:10 PM  Mady Haagensen, PT October 20, 2017 4:10 PM Phone: [phone] Fax: [facility] [address] [place], [name], [address] Phone: [phone]   Fax:  [phone]   PHYSICAL THERAPY DISCHARGE SUMMARY  Visits from Start of Care: 12  Current functional level related to goals / functional outcomes: Pt has met 4 of 5 initial LTGs (see above) and met LTG for today's visit for understanding of use of U-step RW for personal use to improve safety with gait.  Pt has significantly improved gait velocity, posture with gait and endurance with gait with use of U-step RW.   Remaining deficits: Posture, balance, fall risk, gait dependence on walker   Education / Equipment: Educated in [employer], technique for floor transfers, U-step RW use, fall prevention.  Plan: Patient agrees to discharge.  Patient goals were partially met. Patient is being discharged due to being pleased with the current functional level.  ?????Pt agrees to return PT, OT, speech screens in 6 months due  to progressive nature of disease.      Name: Jacqueline Atkinson MRN: [id number] Date of Birth: May 10, 1956   Mady Haagensen, PT 20-Oct-2017 4:10 PM Phone: [phone] Fax: [phone]

## 2017-10-28 DIAGNOSIS — C562 Malignant neoplasm of left ovary: Secondary | ICD-10-CM

## 2017-10-28 HISTORY — DX: Malignant neoplasm of left ovary: C56.2

## 2017-11-05 NOTE — Progress Notes (Signed)
Jacqueline Atkinson was seen today in the movement disorders clinic for neurologic consultation at the request of [name].   The patient presents today to the movement disorder clinic at [facility] neurology for consultation regarding possible Parkinson's disease.  I had the opportunity to review records from [name], her prior neurologist.  It appears that the patient has been seeing [name] since 12/29/2013.  Patient reports that she just has not been the same ever since she had a fall in 2011.  Her R foot got caught in a stool and she fell.  She was subsequently placed under the care of orthopedic surgeons and intermittently would go to have fluid drained from her knee.   She developed a R leg drag.  She, therefore, initially attributed difficulty in walking to this injury.  She states that later on, she developed balance issues (cannot say how long that took to develop).  Over the course of time, however, she noticed decreased ability to use the right hand in order to write and began to use the left hand for more tasks.  She stated that she would have to reach across her body to use the mouse (noted that as of Christmas, 2014).  In Feb, she was at work and had a bad HA and then had a CT and subsequent MRI of the brain as they saw a meningioma and so she was referred to [name].  She saw [name] on 12/29/2013.  About a week later on 01/06/2014 she followed up with [name] who had recommended an EMG and this was done and was normal.  On 01/17/2014 [name] started her on levodopa and referred her for another opinion to [name].  She has not yet had that appointment, but it is scheduled for 06/15/2014.  Pt states that when she first got to the pharmacy, there was a RX there for both and IR and CR tablet (she now thinks that it was accident)  She only picked up the CR and took it bid and stated that it helped her feel "looser" and more steady.  When she f/u with [name], she realized that she was supposed to be on  the IR but when she tried that, she had a lot of nausea and even emesis (tried it with carbs even but initially started with 2 po tid; then went to 1 po 6 times per day and didn't have as much emesis but still had to have the carbs with it and she didn't like all the carb intake).  Therefore, she went back to the CR version on her own and she is only on one tablet at 8am/8pm. She presents today for another opinion.  06/24/14 update:  Pt presents for f/u.  DaT scan was done yesterday.  There was significant reduction in uptake of the radiotracer in the bilateral putamen with activity confined to the caudate.  Pt remains on carbidopa/levodopa 25/100 CR at 8am/8pm.  She has been having more knee pain and went to [place] [facility].  She was told that she needs an MRI but she decided that she didn't want to do an MRI if she wasn't going to have surgery and she didn't want to consider that.  She was given klonopin for insomnia since last visit.  She rarely takes it and said that she only took it a few times.  One of the reasons for insomnia is stiffness associated with the back pain.  Klonopin does help her sleep  when she takes it.  She had one fall since last visit; she was going up the steps and her knee buckled and fell.  She is not sure if her knee caused her to fall.  She is exercising; she is doing yoga twice per week.  She is now able to get off of the floor which she didn't used to be able to do.  She is getting ready to start the [facility] exercise class.   09/26/14 update:  Pt returns today for f/u.  I was trying to transition her slowly to the IR formulation of levodopa, which she has had trouble tolerating in the past.  Last visit, I asked her to try to take carbidopa/levodopa 25/100 CR at 8am and 8pm and carbidopa/levodopa 25/100 IR at noon.  She actually moved up the dosage on her own and she is taking carbidopa/levodopa 25/100, 2 at 8am, 2 at noon, 2 at 4pm, and 2 at 8pm.  She may miss one of those dosages and may  only get in 6 pills per day.  She gets really nauseated after 10 minutes but then it gets better if she can get throught that 10 minutes.   MBE was performed on 07/01/14 and it was normal and a regular, thin liquid diet was recommended.  She did got to UC after a fall on knee on 09/13/14. She needs a knee surgery but had to wait until insurance changes.   She is exercising; she is doing yoga twice per week.  C/o a pain in the mid thoracic region that wakes her up in the middle of the night.  Lidoderm has helped but it was very expensive.  Reviewed MRI report from [facility] in 12/2013 and it was normal.  Having more urinary incontinence.  Mood is really good; feels like she is at best place that she has been in virtually all her life.  02/06/15 update:  Pt is accompanied by her sister who supplements the history.  The patient has a history of multiple system atrophy, cerebellar type.  She is on carbidopa/levodopa 25/100.  She is supposed to be on 2 tablets by mouth 4 times per day that she seems to have nausea and emesis when she takes it that way, so she is only taking 1 tablet by mouth 4 times per day.  She is also on Indocin twice a day and is hoping to get off of that after her knee surgery.  She is having knee surgery on Wednesday, which she thinks is the primary etiology for some of her gait issues.  She has been awaiting this knee surgery for a long time.  Her orthopedic surgeon did tell her that he was not sure that it was going to be able to help all of her knee issues, including her gait, however.  No falls.  She went to an ataxia conference in [place]. She asks multiple questions re: genetics.  She is planning on applying to enroll in a MSA-C MRI study involving a 7 Tesla magnet.  She will need to travel to [place] for this.  She has had some urinary incontinence, occasional.  No diplopia.  Walks with cane most of the time.  She also complains that food is getting "stuck" in the midepigastric region when  she is hungry or when she is tired.  This can consist of posterior, meat, or a salad.  She will have to walk around and stretch and sometimes will cough the entire bolus up whole.  She had a modified barium swallow in September of last year that was unremarkable.  06/13/15 update:  The patient is seen back today in follow-up in regard to her multiple system atrophy.  She is on carbidopa/levodopa 25/100, 1 tablet 4 times per day (8am/12pm/4pm/8pm).  She has had trouble tolerating increased dosages because of nausea, but we were previously unsure whether the nausea was from the levodopa or if it was from the Indocin.  Last visit, she stated that she was hopeful to discontinue the Indocin once she had her knee surgery.  She did have this on April 13 under a local block.  I reviewed those records.  She also saw GI since last visit.  I got a note from her gastroenterologist.  She had EGD which revealed evidence of gastric esophageal reflux disease, esophageal stenosis and a medium sized hiatal hernia.  She was started on Nexium, 40 mg twice a day. She states that she has another EGD on sept 10 to hopefully stretch the esophagus. .  She states that they couldn't stretch the esophagus because it was so ulcerated so placed on nexium first.  She is still having trouble with swallowing but thinks that is the esophagus issues.  She is on a soft diet.  Today, the patient states that she is having more cramping at night and more troubles with dexterity in her fingers.  Still having nausea despite off indocin; thinks from levodopa.  10/13/15 update:  The patient is following up today.  She is accompanied by her sister who supplements the history.  I have reviewed records since our last visit.  She has a history of multiple system atrophy.  She increased her carbidopa/levodopa 25/100, 1 tablet 6 times per day and she takes a CR 25/100 at night.  She has noted some rigidity on the right and some tremor on the right but only with  stress or when she is cold.  She thinks that the increase has helped.  She had an EGD in September and I got notes from that.  There were no lesions in the esophagus.  They did a dilation and she continued to have a medium sized hiatal hernia.  She had a left TKA done on 08/23/2015 and subsequently went to a subacute nursing facility for rehabilitation.  She has recovered nicely from that.  She denies any falls since our last visit.  No hallucinations.  No lightheadedness or near syncope.  She has noted a facial twitch on the right, intermittently.  She is having some dry skin.  02/13/16 update:  The patient is following up today.  She is on carbidopa/levodopa 25/100, one tablet 6 times per day (8am/10/2/4/6/8).  She takes carbidopa/levodopa 50/200 CR at bedtime (9-10 pm).  Overall, she feels that she has been stable and doing fairly well.  She denies falls.  She has not had any lightheadedness or near syncope.  No hallucinations.  Little more twitching of th right face.  Last MRI was 10/2013 and that was without gad and meningioma in the L middle cranial fossa.  Noticed that carpets that are "busy" she will lose depth perception.  Having urinary frequency.  Is on diuretic but takes it in the AM.  Asks me to put in DNR orders. States that she has discussed with her daughter and both agree.  Daughter is Marine scientist.  Is working on Systems analyst.  06/14/16 update:  The patient is following up today.  I have reviewed prior records made available  to me.  She did follow up with her primary care physician.  She remains on carbidopa/levodopa 25/100, one tablet 6 times per day in addition to carbidopa/levodopa 50/200 at bedtime.  When she is tired, her R foot will drag.  She has not had any falls since last visit but she has had some near falls when attempting to go backwards.  She knows also that she cannot navigate in the dark.  She walks better with ski poles than with the walker but she has low endurance.  She  denies any hallucinations.  She does have occasional visual distortions.  No lightheadedness or near syncope.  She still have some twitching of the right side of her face.  Mood has been good.   Urinary incontinence has gotten bad enough that she is wearing a pad.  Face has not been twitching as much  11/12/16 update:  Pt f/u today.  This patient is accompanied in the office by her daughter who supplements the history.  She was on carbidopa/levodopa 25/100, one tablet 6 times per day in addition to carbidopa/levodopa 50/200 at bedtime but she states that she got tired of taking pills so she is taking carbidopa/levodopa 50/200 in the AM and at night and carbidopa/levodopa 25/100 tid.  States that she is having "issues" but had these way before meds were changed.   Pt denies falls but near falls especially when leaning over to pick up something.  R foot wants to "stick" to the floor, independent of timing of meds and even if walker is present.   Trouble getting up from chair and bought a lift chair.  Pt denies lightheadedness, near syncope.  No hallucinations.  Mood has been good.  Having some trouble preparing meals - it is too much effort in preparing the meal.  Same is true with housekeeping.  Noting more clumsy and dropping objects but hands not numb.  Going to rock steady boxing and using gait belt there.  Wearing pad all of the time due to incontinence.    Trouble getting in and out of bed.  Trouble turning over in bed - "Im like a beached whale."    03/07/17 update:  Pt seen today in f/u.  Pt on carbidopa/levodopa 25/100 tid and also takes carbidopa/levodopa 50/200 bid. She is having lots of cramping even in her arms.   This is not necessarily how I wanted her to take medication but she thinks that it works as well this way as when she was taking more of the IR tablets.  No falls but some near falls getting up from the lift chair.  Pt denies lightheadedness, near syncope.  No hallucinations.  Mood has been  good.  The patient did go to [facility] and I reviewed notes.  She was started on Vesicare.  She states that it was great but insurance wouldn't pay for it so she was started on detrol.  It helps but not as much as vesicare.  Last visit we also ordered a hospital bed and she reports that she loves it and it has helped.  She has gained weight and plans to start new diet.  She stopped rock steady boxing because of cost  06/27/17 update:  Patient seen today in follow-up for her multiple system atrophy.  This patient is accompanied in the office by her daughter who supplements the history.  I tried to rework her medication last visit as she was having lots of cramping in her arms.  I thought  that was because she was taking the extended release during the day.  I told her to take either carbidopa/levodopa 25/100, 2 tablets 3 times per day or carbidopa/levodopa 25/100 one tablet 6 times per day in addition to her nighttime carbidopa/levodopa 50/200.  She states that she ended up with carbidopa/levodopa 25/100, 1 po 6 times per day but she is still having the cramping in the arms, and then feet at night.  Having more rigidity in the right shoulder and more dexterity issues with the right hand.  Pt denies falls.  Pt denies lightheadedness, near syncope.  No hallucinations.  Mood has been good.  She is having very vivid dreams and screams out in the middle of the night.  Feels that breathing is "laborious" and "hard to do."  But also denies SOB.  She does describe DOE - "I can talk my air out."  Is c/o swelling of feet and ankles.  Tried lasix in the past but had too much urination.  She denies any dysphagia but then tells me she occasionally will throw up food and thinks that is from her hiatal hernia.  11/07/17 update: Patient seen today in follow-up for MSA.  She has been through physical therapy since our last visit.  She can now get off of the floor which she couldn't before.  Not engaged in exercise now.  Wants  to get back in pool.  She remains on carbidopa/levodopa 25/100, 1 tablet 6 times per day and Cardopa/levodopa 50/200 at night.  Patient saw Dr. Melvyn Novas since last visit.  Pulmonary function testing was recommended.  Treatment for reflux was recommended.  She has been following up with primary care regarding elevated blood pressure.  Hydralazine was added.  Subsequently clonidine was added and the dose has been gradually increased to 0.2 mg twice per day.  Waking up with AM headache.  No bedmate.  She doesn't think that she snores.  She occasionally will wake up gasping for air.  She is on klonopin for RBD.  On one occasion she threw her alarm clock across the room.  On another occasion she kicked her dog out of the bed.  Overall, though, she thinks that it is much better.  Having "hip pain."  Saw Dr. Melvyn Novas.  Told some stenosis.  Told had uterine fibroids and Dr. Melvyn Novas thought that this could be issue.  Hasn't been to the gynecologist yet.  Has constipation.  Trying to keep up with it with diet.  Drinking lots of water.  Bladder:  Unable to tell when going.  Eye twitching getting worse and sometimes affecting vision and ability to read.  Normally sees optometry.    Neuroimaging has previously been performed.  It is available for my review today.  PREVIOUS MEDICATIONS: Sinemet and Sinemet CR  ALLERGIES:   Allergies  Allergen Reactions  . Sulfa Antibiotics     Long time ago and does not remember reaction    CURRENT MEDICATIONS:  Current Outpatient Medications on File Prior to Visit  Medication Sig Dispense Refill  . acetaminophen (TYLENOL) 500 MG tablet Take 1,000 mg by mouth every 4 (four) hours as needed for moderate pain.     Marland Kitchen atenolol (TENORMIN) 100 MG tablet Take 1 tablet (100 mg total) by mouth daily. 90 tablet 0  . carbidopa-levodopa (SINEMET CR) 50-200 MG tablet TAKE ONE TABLET BY MOUTH AT BEDTIME 90 tablet 1  . carbidopa-levodopa (SINEMET IR) 25-100 MG tablet Take 1 tablet 6 times daily  (Patient taking differently: Take  1 tablet by mouth 3 (three) times daily. ) 540 tablet 2  . clonazePAM (KLONOPIN) 0.5 MG tablet Take 0.25 mg by mouth at bedtime.    . cloNIDine (CATAPRES) 0.2 MG tablet Take 1 tablet (0.2 mg total) by mouth 2 (two) times daily. 180 tablet 0  . irbesartan (AVAPRO) 300 MG tablet TAKE 1 TABLET BY MOUTH DAILY 90 tablet 0  . omeprazole (PRILOSEC) 40 MG capsule Take 1 capsule (40 mg total) by mouth daily. (Patient taking differently: Take 80 mg by mouth daily. ) 90 capsule 3  . tolterodine (DETROL LA) 4 MG 24 hr capsule Take 4 mg by mouth daily.  0   No current facility-administered medications on file prior to visit.     PAST MEDICAL HISTORY:   Past Medical History:  Diagnosis Date  . Allergy   . Anemia   . Anxiety   . Arthritis    Left knee  . Edema extremities    lower extremity edema left greater than right.  Marland Kitchen GERD (gastroesophageal reflux disease)   . Heart murmur   . History of hiatal hernia    small  . Hx of seasonal allergies    occ. use OTC Mucinex or Antihistamine.  . Hyperlipidemia   . Hypertension   . Mobility impaired    ambulates with walker, tendency to lose balance.  . 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 Neuromuscular disorder ([place])    "multi system atrophy disease"- "pain, bilateral foot numbness, left knee pain"- Dr. Carles Collet is following.  . Shoulder disorder    right shoulder "rigidity due to Multi system atrophy"- ROM not limited"just tires me"    PAST SURGICAL HISTORY:   Past Surgical History:  Procedure Laterality Date  . ESOPHAGOGASTRODUODENOSCOPY ENDOSCOPY     with esophageal dilation  . FOOT SURGERY  2011   3rd metatarsal LT foot  . KNEE ARTHROSCOPY Left 02/08/2015   Procedure: LEFT KNEE ARTHROSCOPY ;  Surgeon: Latanya Maudlin, MD;  Location: [facility];  Service: Orthopedics;  Laterality: Left;  . TONSILLECTOMY AND ADENOIDECTOMY    . TOTAL KNEE  ARTHROPLASTY Left 08/22/2015   Procedure: LEFT TOTAL  KNEE  ARTHROPLASTY;  Surgeon: Latanya Maudlin, MD;  Location: [facility];  Service: Orthopedics;  Laterality: Left;    SOCIAL HISTORY:   Social History   Socioeconomic History  . Marital status: Divorced    Spouse name: Not on file  . Number of children: 7  . Years of education: 27  . Highest education level: Not on file  Social Needs  . Financial resource strain: Not on file  . Food insecurity - worry: Not on file  . Food insecurity - inability: Not on file  . Transportation needs - medical: Not on file  . Transportation needs - non-medical: Not on file  Occupational History  . Occupation: ACTIVITY COORDINATOR    Employer: [employer]  Tobacco Use  . Smoking status: Former Smoker    Packs/day: 1.00    Years: 12.00    Pack years: 12.00    Last attempt to quit: 03/14/1988    Years since quitting: 29.6  . Smokeless tobacco: Never Used  Substance and Sexual Activity  . Alcohol use: No  . Drug use: No  . Sexual activity: Not on file  Other Topics Concern  . Not on file  Social History Narrative  . Not on file    FAMILY HISTORY:  Family Status  Relation Name Status  . Mother  Deceased at age 110       diabetes, kidney failure, melanoma  . Father  Deceased at age 47       COPD  . Brother  Deceased       brain cancer  . Sister  Alive       healthy  . Daughter  Alive       healthy  . Daughter  Alive       healthy  . Daughter  Alive       healthy  . Daughter  Alive       healthy  . Daughter  Alive       healthy  . Son  Alive       healthy  . Son  Alive       healthy    ROS:  A complete 10 system review of systems was obtained and was unremarkable apart from what is mentioned above.  PHYSICAL EXAMINATION:    VITALS:   Vitals:   11/07/17 1357  BP: 140/80  Pulse: 68  SpO2: 97%  Weight: 245 lb (111.1 kg)  Height: 5\' 8"  (1.727 m)   Wt Readings from Last 3 Encounters:  11/07/17 245 lb  (111.1 kg)  09/01/17 255 lb 12.8 oz (116 kg)  08/22/17 253 lb (114.8 kg)     GEN:  The patient appears stated age and is in NAD. HEENT:  Normocephalic, atraumatic.  The mucous membranes are moist. The superficial temporal arteries are without ropiness or tenderness.  There is mild R hemifacial spasm CV:  RRR w 3/6 sem Lungs:  CTAB Neck/HEME:  There are no carotid bruits bilaterally.  Neurological examination:  Orientation: The patient is alert and oriented x3.  Cranial nerves: There is good facial symmetry.  There is R hemifacial spasm.  The visual fields are full to confrontational testing. The speech is fluent and clear. Soft palate rises symmetrically and there is no tongue deviation. Hearing is intact to conversational tone. Sensation: Sensation is intact to light touch throughout. Motor: Strength is 5/5 in the bilateral upper and lower extremities.   Shoulder shrug is equal and symmetric.  There is no pronator drift.   Movement examination: Tone: There is good tone today Abnormal movements: No tremor noted today.  There is mild dyskinesia of the L leg Coordination:  She had slowing of RAMs with finger taps and hand opening and closing on the right.  They were normal on the L Gait and Station: The patient gets out of the chair by pushing off the chair.  She walks with her walker and walks better with her new walker than her old walker  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      -continue carbidopa/levodopa 25/100, 1 po 6 times per day    -She will continue carbidopa/levodopa 50/200 at night  -I encouraged her to check out [facility] and gave her a brochure 2.  Hemifacial spasm, right  -MRI done within without gadolinium on 02/28/2016 demonstrated a known left sphenoid wing meningioma that was stable.  It was otherwise unremarkable.  Reports that this has been better as of late  -it is worse and she is scheduled for botox in February  -vision seems to  independently getting worse.  Will send to [facility] ophthalmology - Dr. Valetta Close scheduled 3.  Dysphagia.  -MBE was performed on 07/01/14 and it was normal and a regular,  thin liquid diet was recommended.  She denies trouble with that but is throwing up food after eating.  Will repeat. 3.  Knee pain.    -She had left TKA on 08/23/15 and she is doing well. 4.  Urinary incontinence  -saw urology and started on vesicare and did well but insurance didn't pay.  Now on detrol.  -ask urologist to see if bladder biofeedback would be helpful 5.  RBD  -can try to increase klonopin to 1 full pill at night 6.  AM Headache  -talked about possiblity for sleep apnea.  Has been recently put on much BP medication and still having morning headache.  Will see if she can do home PSG. 7.  Constipation  -given rancho recipe 8.   Follow up is anticipated in the next few months, sooner should new neurologic issues arise.  Much greater than 50% of this visit was spent in counseling and coordinating care.  Total face to face time:  45 min.  This didn't include the time she spent with our social worker today

## 2017-11-06 ENCOUNTER — Ambulatory Visit: Payer: Medicare Other | Admitting: Neurology

## 2017-11-07 ENCOUNTER — Other Ambulatory Visit (HOSPITAL_COMMUNITY): Payer: Self-pay | Admitting: Neurology

## 2017-11-07 ENCOUNTER — Encounter: Payer: Self-pay | Admitting: Neurology

## 2017-11-07 ENCOUNTER — Ambulatory Visit: Payer: Medicare Other | Admitting: Neurology

## 2017-11-07 ENCOUNTER — Encounter: Payer: Self-pay | Admitting: Psychology

## 2017-11-07 VITALS — BP 140/80 | HR 68 | Ht 68.0 in | Wt 245.0 lb

## 2017-11-07 DIAGNOSIS — R1310 Dysphagia, unspecified: Secondary | ICD-10-CM

## 2017-11-07 DIAGNOSIS — R0681 Apnea, not elsewhere classified: Secondary | ICD-10-CM | POA: Diagnosis not present

## 2017-11-07 DIAGNOSIS — K5901 Slow transit constipation: Secondary | ICD-10-CM

## 2017-11-07 DIAGNOSIS — G239 Degenerative disease of basal ganglia, unspecified: Secondary | ICD-10-CM

## 2017-11-07 DIAGNOSIS — H539 Unspecified visual disturbance: Secondary | ICD-10-CM | POA: Diagnosis not present

## 2017-11-07 DIAGNOSIS — G4752 REM sleep behavior disorder: Secondary | ICD-10-CM

## 2017-11-07 DIAGNOSIS — R1319 Other dysphagia: Secondary | ICD-10-CM

## 2017-11-07 DIAGNOSIS — G903 Multi-system degeneration of the autonomic nervous system: Secondary | ICD-10-CM

## 2017-11-07 NOTE — Patient Instructions (Addendum)
1. Constipation and Parkinson's disease:   1.Rancho recipe for constipation in Parkinsons Disease:  -1 cup of unprocessed bran (need to get this at [employer], [employer] or  similar type of store), 2 cups of applesauce in 1 cup of prune juice  2.  Increase fiber intake (Metamucil,vegetables)  3.  Regular, moderate exercise can be beneficial.  4.  Avoid medications causing constipation, such as medications like antacids  with calcium or magnesium  5.  Laxative overuse should be avoided.  6.  Stool softeners (Colace) can help with chronic constipation.  7.  Increase water intake.  You should be drinking 1/2 gallon of water a day as  long as you have not been diagnosed with congestive heart failure or  renal/kidney failure.  This is probably the single greatest thing that you can do  to help your constipation.  2. Appt made with [facility] - Dr. Valetta Close on 01/12/18 at 3:15 pm. They are located at [address] in [place]. If this is not a good date/time please call [phone] to reschedule.   3. Increase Clonazepam 0.5 mg to one tablet at bedtime.   4. We will send a referral for a home sleep study. You will pick this up from [facility]. If they do not call you about scheduling a time for this they can be reached at [phone].  5. We have scheduled you at [facility] for your modified barium swallow on 11/19/17 at 1:00 pm. Please arrive 15 minutes prior and go to 1st floor radiology. If you need to reschedule for any reason please call [phone].

## 2017-11-07 NOTE — Progress Notes (Signed)
I met with the patient today while she was in the office.  She will be coming to the atypical parkinsonian support group this month.  We talked a little bit about her family life.  She lives with her-2 daughters and son-in-law. Her oldest daughter is a Therapist, sports. There youngest daughters is having a tough time with anticipatory grief.  I shared information about hospice and palliative care is anticipatory grief counseling which would be free.  In addition, the patient shared some thoughts about the challenges that she has living with her disease and the idea that it progressing.  Burnetta Sabin from [facility] would be an appropriate fit for the patient since they provide services in the home and because [facility] has specific training and counseling with terminal illness, loss and grief.  I will make a referral for the patient in addition provided the patient with the contact information for [facility].  She was interested in the services.  The patient currently needs a ramp in the front of her home.  I will look into resources for that. I provided her information for financial assistance/scholarship for personal training.  The patient has my information shall she have needs, questions or resources during this journey.

## 2017-11-11 ENCOUNTER — Telehealth: Payer: Self-pay | Admitting: Neurology

## 2017-11-11 NOTE — Telephone Encounter (Signed)
Notes faxed to Dr. Valetta Close at [facility] for upcoming appt at [phone] with confirmation received.

## 2017-11-19 ENCOUNTER — Ambulatory Visit (HOSPITAL_COMMUNITY)
Admission: RE | Admit: 2017-11-19 | Discharge: 2017-11-19 | Disposition: A | Discharge status: Home / Self Care | Payer: Medicare Other | Source: Ambulatory Visit | Attending: Neurology | Admitting: Neurology

## 2017-11-19 DIAGNOSIS — R1310 Dysphagia, unspecified: Secondary | ICD-10-CM

## 2017-11-19 DIAGNOSIS — R1319 Other dysphagia: Secondary | ICD-10-CM | POA: Insufficient documentation

## 2017-11-24 ENCOUNTER — Other Ambulatory Visit: Payer: Self-pay | Admitting: Family Medicine

## 2017-11-24 NOTE — Telephone Encounter (Signed)
Last filled 09/01/17, 90 tabs, no refill, last seen 09/01/17, no follow up scheduled at this time. Pls advise

## 2017-11-28 ENCOUNTER — Ambulatory Visit (HOSPITAL_BASED_OUTPATIENT_CLINIC_OR_DEPARTMENT_OTHER): Payer: Medicare Other | Attending: Neurology | Admitting: Internal Medicine

## 2017-12-04 ENCOUNTER — Ambulatory Visit (INDEPENDENT_AMBULATORY_CARE_PROVIDER_SITE_OTHER): Payer: Medicare Other | Admitting: Neurology

## 2017-12-04 DIAGNOSIS — G5139 Clonic hemifacial spasm, unspecified: Secondary | ICD-10-CM

## 2017-12-04 MED ORDER — ONABOTULINUMTOXINA 100 UNITS IJ SOLR
20.0000 [IU] | Freq: Once | INTRAMUSCULAR | Status: AC
  Administered 2017-12-04: 20 [IU] via INTRAMUSCULAR

## 2017-12-04 NOTE — Procedures (Signed)
[facility]   History:  Diagnosis: Hemifacial spasm   Initial side: right   Result History  Onset of effect: n/a  Duration of Benefit: n/a  Adverse Effects: n/a   Consent obtained from: The patient Benefits discussed included, but were not limited to decreased muscle tightness, increased joint range of motion, and decreased pain.  Risk discussed included, but were not limited pain and discomfort, bleeding, bruising, excessive weakness, venous thrombosis, muscle atrophy and dysphagia.  A copy of the patient medication guide was given to the patient which explains the blackbox warning.  Patients identity and treatment sites confirmed Yes.  .  Details of Procedure: Skin was cleaned with alcohol.  A 30 gauge, 1/2 inch needle was introduced to target mm.  Prior to injection, the needle plunger was aspirated to make sure the needle was not within a blood vessel.  There was no blood retrieved on aspiration.    Following is a summary of the muscles injected  And the amount of Botulinum toxin used:  Injections  Location Left  Right Units Number of sites        Corrugator      Frontalis      Lower Lid, Lateral  2.5 2.5   Lower Lid Medial  2.5 2.5   Upper Lid, Lateral  2.5 2.5   Upper Lid, Medial  2.5 2.5   Canthus  5.0 5.0   Temporalis      Masseter      Procerus      Zygomaticus Major      TOTAL UNITS:   15    Agent: Botulinum Type A ( Onobotulinum Toxin type A ).  1 vials of Botox were used, each containing 50 units and freshly diluted with 2 mL of sterile, non-perserved saline   Total injected (Units): 15  Total wasted (Units): 10 Pt tolerated procedure well without complications.   Reinjection is anticipated in 3 months.

## 2017-12-05 ENCOUNTER — Ambulatory Visit: Payer: Medicare Other | Admitting: Neurology

## 2017-12-09 ENCOUNTER — Other Ambulatory Visit: Payer: Self-pay | Admitting: Neurology

## 2017-12-12 ENCOUNTER — Ambulatory Visit (HOSPITAL_BASED_OUTPATIENT_CLINIC_OR_DEPARTMENT_OTHER): Payer: Medicare Other | Attending: Neurology | Admitting: Internal Medicine

## 2017-12-12 DIAGNOSIS — R0683 Snoring: Secondary | ICD-10-CM | POA: Insufficient documentation

## 2017-12-12 DIAGNOSIS — R0681 Apnea, not elsewhere classified: Secondary | ICD-10-CM | POA: Insufficient documentation

## 2017-12-12 DIAGNOSIS — G903 Multi-system degeneration of the autonomic nervous system: Secondary | ICD-10-CM

## 2017-12-12 DIAGNOSIS — G239 Degenerative disease of basal ganglia, unspecified: Secondary | ICD-10-CM | POA: Diagnosis not present

## 2017-12-23 ENCOUNTER — Other Ambulatory Visit: Payer: Self-pay | Admitting: Family Medicine

## 2017-12-23 ENCOUNTER — Other Ambulatory Visit: Payer: Self-pay | Admitting: Neurology

## 2017-12-26 DIAGNOSIS — G239 Degenerative disease of basal ganglia, unspecified: Secondary | ICD-10-CM | POA: Diagnosis not present

## 2017-12-26 NOTE — Procedures (Signed)
   Patient Name: Jacqueline Atkinson, Jacqueline Atkinson Date: 12/12/2017 Gender: Female D.O.B: 1955/11/01 Age (years): 61 Referring Provider: Wells Guiles Tat Height (inches): 19 Interpreting Physician: Baird Lyons MD, ABSM Weight (lbs): 250 RPSGT: Jacolyn Reedy BMI: 38 MRN: [id number] Neck Size: <br> <br> <br> CLINICAL INFORMATION Sleep Study Type: HST Indication for sleep study: Witnessed Apneas  Epworth Sleepiness Score: 8  SLEEP STUDY TECHNIQUE A multi-channel overnight portable sleep study was performed. The channels recorded were: nasal airflow, thoracic respiratory movement, and oxygen saturation with a pulse oximetry. Snoring was also monitored.  MEDICATIONS Patient self administered medications include: none reported.  SLEEP ARCHITECTURE Patient was studied for 575.5 minutes. The sleep efficiency was 98.0 % and the patient was supine for 17.4%. The arousal index was 0.0 per hour.  RESPIRATORY PARAMETERS The overall AHI was 2.5 per hour, with a central apnea index of 0.0 per hour.  The oxygen nadir was 89% during sleep.  CARDIAC DATA Mean heart rate during sleep was 56.8 bpm.  IMPRESSIONS - No significant obstructive sleep apnea occurred during this study (AHI = 2.5/h). - No significant central sleep apnea occurred during this study (CAI = 0.0/h). - Mild oxygen desaturation was noted during this study (Min O2 = 89%). Mean 95%. - Patient snored.  DIAGNOSIS - Primary snoring  RECOMMENDATIONS - Be careful with alcohol, sedatives and other CNS depressants that may worsen sleep apnea and disrupt normal sleep architecture. - Sleep hygiene should be reviewed to assess factors that may improve sleep quality. - Weight management and regular exercise should be initiated or continued.  [Electronically signed] 12/26/2017 07:17 PM  Baird Lyons MD, [place], American Board of Sleep Medicine   NPI: [id number]                          [place], [place] [facility]  ELECTRONICALLY SIGNED ON:  12/26/2017, 7:15 PM [place] PH: [phone]   FX: [phone] [place]

## 2017-12-29 ENCOUNTER — Telehealth: Payer: Self-pay | Admitting: Neurology

## 2017-12-29 NOTE — Telephone Encounter (Signed)
Mychart message sent to patient.

## 2017-12-29 NOTE — Telephone Encounter (Signed)
-----   Message from [place], DO sent at 12/29/2017  7:27 AM EST ----- Let pt know that her PSG was normal

## 2018-01-03 ENCOUNTER — Telehealth: Payer: Self-pay | Admitting: Family Medicine

## 2018-01-03 MED ORDER — TELMISARTAN 80 MG PO TABS: 80.0000 mg | ORAL_TABLET | Freq: Every day | ORAL | 1 refills | Status: DC

## 2018-01-03 NOTE — Telephone Encounter (Signed)
Note from pharm that irbesartan 300mg  is on back order but they have telmisartan and losartan. Will change to telmisartan.

## 2018-01-09 ENCOUNTER — Telehealth: Payer: Self-pay | Admitting: Family Medicine

## 2018-01-09 ENCOUNTER — Telehealth: Payer: Self-pay

## 2018-01-09 NOTE — Telephone Encounter (Signed)
Copied from [place]. Topic: Quick Communication - See Telephone Encounter >> Jan 09, 2018  3:29 PM Neva Seat wrote: [facility] Medicare wanted to let Dr. Brigitte Pulse know pt's Telmisartan 80 mg has been approved for the non formulary exception From 01-09-2018 to 01-10-2019.

## 2018-01-09 NOTE — Telephone Encounter (Signed)
Prior Auth started for Irbesartan. Message stating additional information is required. Awaiting Fax   Key: [id number]

## 2018-01-12 DIAGNOSIS — H04123 Dry eye syndrome of bilateral lacrimal glands: Secondary | ICD-10-CM | POA: Diagnosis not present

## 2018-01-12 DIAGNOSIS — H2513 Age-related nuclear cataract, bilateral: Secondary | ICD-10-CM | POA: Diagnosis not present

## 2018-01-12 DIAGNOSIS — H52213 Irregular astigmatism, bilateral: Secondary | ICD-10-CM | POA: Diagnosis not present

## 2018-01-13 NOTE — Telephone Encounter (Signed)
Called patient informed.  She is going to try [facility] rx for [facility] to get a better price.  If she cant she will need a new mediation alternative sent over.

## 2018-01-16 ENCOUNTER — Telehealth: Payer: Self-pay

## 2018-01-16 NOTE — Telephone Encounter (Signed)
Received PA Request for Telmisartan  Called insurance company Telmisartan approved 01/08/2018 through 01/09/2019 Pharmacy notified

## 2018-01-26 NOTE — Progress Notes (Signed)
Jacqueline Atkinson was seen today in the movement disorders clinic for neurologic consultation at the request of [name].   The patient presents today to the movement disorder clinic at [facility] neurology for consultation regarding possible Parkinson's disease.  I had the opportunity to review records from [name], her prior neurologist.  It appears that the patient has been seeing [name] since 12/29/2013.  Patient reports that she just has not been the same ever since she had a fall in 2011.  Her R foot got caught in a stool and she fell.  She was subsequently placed under the care of orthopedic surgeons and intermittently would go to have fluid drained from her knee.   She developed a R leg drag.  She, therefore, initially attributed difficulty in walking to this injury.  She states that later on, she developed balance issues (cannot say how long that took to develop).  Over the course of time, however, she noticed decreased ability to use the right hand in order to write and began to use the left hand for more tasks.  She stated that she would have to reach across her body to use the mouse (noted that as of Christmas, 2014).  In Feb, she was at work and had a bad HA and then had a CT and subsequent MRI of the brain as they saw a meningioma and so she was referred to [name].  She saw [name] on 12/29/2013.  About a week later on 01/06/2014 she followed up with [name] who had recommended an EMG and this was done and was normal.  On 01/17/2014 [name] started her on levodopa and referred her for another opinion to [facility].  She has not yet had that appointment, but it is scheduled for 06/15/2014.  Pt states that when she first got to the pharmacy, there was a RX there for both and IR and CR tablet (she now thinks that it was accident)  She only picked up the CR and took it bid and stated that it helped her feel "looser" and more steady.  When she f/u with [name], she realized that she was supposed to be on  the IR but when she tried that, she had a lot of nausea and even emesis (tried it with carbs even but initially started with 2 po tid; then went to 1 po 6 times per day and didn't have as much emesis but still had to have the carbs with it and she didn't like all the carb intake).  Therefore, she went back to the CR version on her own and she is only on one tablet at 8am/8pm. She presents today for another opinion.  06/24/14 update:  Pt presents for f/u.  DaT scan was done yesterday.  There was significant reduction in uptake of the radiotracer in the bilateral putamen with activity confined to the caudate.  Pt remains on carbidopa/levodopa 25/100 CR at 8am/8pm.  She has been having more knee pain and went to [facility].  She was told that she needs an MRI but she decided that she didn't want to do an MRI if she wasn't going to have surgery and she didn't want to consider that.  She was given klonopin for insomnia since last visit.  She rarely takes it and said that she only took it a few times.  One of the reasons for insomnia is stiffness associated with the back pain.  Klonopin does help her sleep  when she takes it.  She had one fall since last visit; she was going up the steps and her knee buckled and fell.  She is not sure if her knee caused her to fall.  She is exercising; she is doing yoga twice per week.  She is now able to get off of the floor which she didn't used to be able to do.  She is getting ready to start the PWR exercise class.   09/26/14 update:  Pt returns today for f/u.  I was trying to transition her slowly to the IR formulation of levodopa, which she has had trouble tolerating in the past.  Last visit, I asked her to try to take carbidopa/levodopa 25/100 CR at 8am and 8pm and carbidopa/levodopa 25/100 IR at noon.  She actually moved up the dosage on her own and she is taking carbidopa/levodopa 25/100, 2 at 8am, 2 at noon, 2 at 4pm, and 2 at 8pm.  She may miss one of those dosages and may  only get in 6 pills per day.  She gets really nauseated after 10 minutes but then it gets better if she can get throught that 10 minutes.   MBE was performed on 07/01/14 and it was normal and a regular, thin liquid diet was recommended.  She did got to UC after a fall on knee on 09/13/14. She needs a knee surgery but had to wait until insurance changes.   She is exercising; she is doing yoga twice per week.  C/o a pain in the mid thoracic region that wakes her up in the middle of the night.  Lidoderm has helped but it was very expensive.  Reviewed MRI report from [facility] in 12/2013 and it was normal.  Having more urinary incontinence.  Mood is really good; feels like she is at best place that she has been in virtually all her life.  02/06/15 update:  Pt is accompanied by her sister who supplements the history.  The patient has a history of multiple system atrophy, cerebellar type.  She is on carbidopa/levodopa 25/100.  She is supposed to be on 2 tablets by mouth 4 times per day that she seems to have nausea and emesis when she takes it that way, so she is only taking 1 tablet by mouth 4 times per day.  She is also on Indocin twice a day and is hoping to get off of that after her knee surgery.  She is having knee surgery on Wednesday, which she thinks is the primary etiology for some of her gait issues.  She has been awaiting this knee surgery for a long time.  Her orthopedic surgeon did tell her that he was not sure that it was going to be able to help all of her knee issues, including her gait, however.  No falls.  She went to an ataxia conference in [place]. She asks multiple questions re: genetics.  She is planning on applying to enroll in a MSA-C MRI study involving a 7 Tesla magnet.  She will need to travel to [place] for this.  She has had some urinary incontinence, occasional.  No diplopia.  Walks with cane most of the time.  She also complains that food is getting "stuck" in the midepigastric region when  she is hungry or when she is tired.  This can consist of posterior, meat, or a salad.  She will have to walk around and stretch and sometimes will cough the entire bolus up whole.  She had a modified barium swallow in September of last year that was unremarkable.  06/13/15 update:  The patient is seen back today in follow-up in regard to her multiple system atrophy.  She is on carbidopa/levodopa 25/100, 1 tablet 4 times per day (8am/12pm/4pm/8pm).  She has had trouble tolerating increased dosages because of nausea, but we were previously unsure whether the nausea was from the levodopa or if it was from the Indocin.  Last visit, she stated that she was hopeful to discontinue the Indocin once she had her knee surgery.  She did have this on April 13 under a local block.  I reviewed those records.  She also saw GI since last visit.  I got a note from her gastroenterologist.  She had EGD which revealed evidence of gastric esophageal reflux disease, esophageal stenosis and a medium sized hiatal hernia.  She was started on Nexium, 40 mg twice a day. She states that she has another EGD on sept 10 to hopefully stretch the esophagus. .  She states that they couldn't stretch the esophagus because it was so ulcerated so placed on nexium first.  She is still having trouble with swallowing but thinks that is the esophagus issues.  She is on a soft diet.  Today, the patient states that she is having more cramping at night and more troubles with dexterity in her fingers.  Still having nausea despite off indocin; thinks from levodopa.  10/13/15 update:  The patient is following up today.  She is accompanied by her sister who supplements the history.  I have reviewed records since our last visit.  She has a history of multiple system atrophy.  She increased her carbidopa/levodopa 25/100, 1 tablet 6 times per day and she takes a CR 25/100 at night.  She has noted some rigidity on the right and some tremor on the right but only with  stress or when she is cold.  She thinks that the increase has helped.  She had an EGD in September and I got notes from that.  There were no lesions in the esophagus.  They did a dilation and she continued to have a medium sized hiatal hernia.  She had a left TKA done on 08/23/2015 and subsequently went to a subacute nursing facility for rehabilitation.  She has recovered nicely from that.  She denies any falls since our last visit.  No hallucinations.  No lightheadedness or near syncope.  She has noted a facial twitch on the right, intermittently.  She is having some dry skin.  02/13/16 update:  The patient is following up today.  She is on carbidopa/levodopa 25/100, one tablet 6 times per day (8am/10/2/4/6/8).  She takes carbidopa/levodopa 50/200 CR at bedtime (9-10 pm).  Overall, she feels that she has been stable and doing fairly well.  She denies falls.  She has not had any lightheadedness or near syncope.  No hallucinations.  Little more twitching of th right face.  Last MRI was 10/2013 and that was without gad and meningioma in the L middle cranial fossa.  Noticed that carpets that are "busy" she will lose depth perception.  Having urinary frequency.  Is on diuretic but takes it in the AM.  Asks me to put in DNR orders. States that she has discussed with her daughter and both agree.  Daughter is Marine scientist.  Is working on Systems analyst.  06/14/16 update:  The patient is following up today.  I have reviewed prior records made available  to me.  She did follow up with her primary care physician.  She remains on carbidopa/levodopa 25/100, one tablet 6 times per day in addition to carbidopa/levodopa 50/200 at bedtime.  When she is tired, her R foot will drag.  She has not had any falls since last visit but she has had some near falls when attempting to go backwards.  She knows also that she cannot navigate in the dark.  She walks better with ski poles than with the walker but she has low endurance.  She  denies any hallucinations.  She does have occasional visual distortions.  No lightheadedness or near syncope.  She still have some twitching of the right side of her face.  Mood has been good.   Urinary incontinence has gotten bad enough that she is wearing a pad.  Face has not been twitching as much  11/12/16 update:  Pt f/u today.  This patient is accompanied in the office by her daughter who supplements the history.  She was on carbidopa/levodopa 25/100, one tablet 6 times per day in addition to carbidopa/levodopa 50/200 at bedtime but she states that she got tired of taking pills so she is taking carbidopa/levodopa 50/200 in the AM and at night and carbidopa/levodopa 25/100 tid.  States that she is having "issues" but had these way before meds were changed.   Pt denies falls but near falls especially when leaning over to pick up something.  R foot wants to "stick" to the floor, independent of timing of meds and even if walker is present.   Trouble getting up from chair and bought a lift chair.  Pt denies lightheadedness, near syncope.  No hallucinations.  Mood has been good.  Having some trouble preparing meals - it is too much effort in preparing the meal.  Same is true with housekeeping.  Noting more clumsy and dropping objects but hands not numb.  Going to rock steady boxing and using gait belt there.  Wearing pad all of the time due to incontinence.    Trouble getting in and out of bed.  Trouble turning over in bed - "Im like a beached whale."    03/07/17 update:  Pt seen today in f/u.  Pt on carbidopa/levodopa 25/100 tid and also takes carbidopa/levodopa 50/200 bid. She is having lots of cramping even in her arms.   This is not necessarily how I wanted her to take medication but she thinks that it works as well this way as when she was taking more of the IR tablets.  No falls but some near falls getting up from the lift chair.  Pt denies lightheadedness, near syncope.  No hallucinations.  Mood has been  good.  The patient did go to [facility] and I reviewed notes.  She was started on Vesicare.  She states that it was great but insurance wouldn't pay for it so she was started on detrol.  It helps but not as much as vesicare.  Last visit we also ordered a hospital bed and she reports that she loves it and it has helped.  She has gained weight and plans to start new diet.  She stopped rock steady boxing because of cost  06/27/17 update:  Patient seen today in follow-up for her multiple system atrophy.  This patient is accompanied in the office by her daughter who supplements the history.  I tried to rework her medication last visit as she was having lots of cramping in her arms.  I thought  that was because she was taking the extended release during the day.  I told her to take either carbidopa/levodopa 25/100, 2 tablets 3 times per day or carbidopa/levodopa 25/100 one tablet 6 times per day in addition to her nighttime carbidopa/levodopa 50/200.  She states that she ended up with carbidopa/levodopa 25/100, 1 po 6 times per day but she is still having the cramping in the arms, and then feet at night.  Having more rigidity in the right shoulder and more dexterity issues with the right hand.  Pt denies falls.  Pt denies lightheadedness, near syncope.  No hallucinations.  Mood has been good.  She is having very vivid dreams and screams out in the middle of the night.  Feels that breathing is "laborious" and "hard to do."  But also denies SOB.  She does describe DOE - "I can talk my air out."  Is c/o swelling of feet and ankles.  Tried lasix in the past but had too much urination.  She denies any dysphagia but then tells me she occasionally will throw up food and thinks that is from her hiatal hernia.  11/07/17 update: Patient seen today in follow-up for MSA.  She has been through physical therapy since our last visit.  She can now get off of the floor which she couldn't before.  Not engaged in exercise now.  Wants  to get back in pool.  She remains on carbidopa/levodopa 25/100, 1 tablet 6 times per day and Cardopa/levodopa 50/200 at night.  Patient saw Dr. Melvyn Novas since last visit.  Pulmonary function testing was recommended.  Treatment for reflux was recommended.  She has been following up with primary care regarding elevated blood pressure.  Hydralazine was added.  Subsequently clonidine was added and the dose has been gradually increased to 0.2 mg twice per day.  Waking up with AM headache.  No bedmate.  She doesn't think that she snores.  She occasionally will wake up gasping for air.  She is on klonopin for RBD.  On one occasion she threw her alarm clock across the room.  On another occasion she kicked her dog out of the bed.  Overall, though, she thinks that it is much better.  Having "hip pain."  Saw Dr. Melvyn Novas.  Told some stenosis.  Told had uterine fibroids and Dr. Melvyn Novas thought that this could be issue.  Hasn't been to the gynecologist yet.  Has constipation.  Trying to keep up with it with diet.  Drinking lots of water.  Bladder:  Unable to tell when going.  Eye twitching getting worse and sometimes affecting vision and ability to read.  Normally sees optometry.    01/28/18 update: Patient is seen today in follow-up.  She is accompanied by her sister who supplements the history.  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  She reports that "I can read again and my eye isn't slamming shut."  She is having some twitching of the lower face near mouth and some mild droop but thinks related to twitching.    She is still on carbidopa/levodopa 25/100, 1 tablet 6 times per day and carbidopa/levodopa 50/200 at night.  Noting increasing rigidity.  R foot is dragging and sometimes cemented to floor.  She has not had falls.  She did have a nocturnal polysomnogram on December 12, 2017.  There is no evidence of sleep apnea (AHI was 2.5).  In regards to morning headache, she reports that still having them but thinks related  to  BP.  She notes that BP is very high when wakes up (180s) but then she takes her blood pressure meds and headache goes away.  She had a MBE on November 19, 2017.  This was unremarkable, with the exception of the tablet of barium lodged at the distal esophagus at one point.  Mechanical soft diet with thin liquids as recommended.  No falls.  Not exercising regularly - has called [facility] but no call back.   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  Using diapers now due to incontinence.  Sometimes its a dribble and sometimes its a lot.  Gets feeling she needs to go but will sometimes get there and already be going.  Saw Dr. Valetta Close and told she has cataracts and dry eye and using eye drops 4 times per day.  Tried to go to gyn re: uterine fibroids but couldn't find one that took insurance.  Noting some inspiratory sighs  Neuroimaging has previously been performed.  It is available for my review today.  PREVIOUS MEDICATIONS: Sinemet and Sinemet CR  ALLERGIES:   Allergies  Allergen Reactions  . Sulfa Antibiotics     Long time ago and does not remember reaction    CURRENT MEDICATIONS:  Current Outpatient Medications on File Prior to Visit  Medication Sig Dispense Refill  . acetaminophen (TYLENOL) 500 MG tablet Take 1,000 mg by mouth every 4 (four) hours as needed for moderate pain.     Marland Kitchen atenolol (TENORMIN) 100 MG tablet TAKE 1 TABLET BY MOUTH DAILY 90 tablet 0  . carbidopa-levodopa (SINEMET CR) 50-200 MG tablet TAKE ONE TABLET BY MOUTH AT BEDTIME 90 tablet 1  . clonazePAM (KLONOPIN) 0.5 MG tablet Take 1 tablet (0.5 mg total) by mouth at bedtime. 90 tablet 1  . cloNIDine (CATAPRES) 0.2 MG tablet TAKE 1 TABLET BY MOUTH TWICE DAILY 180 tablet 0  . omeprazole (PRILOSEC) 40 MG capsule Take 1 capsule (40 mg total) by mouth daily. (Patient taking differently: Take 80 mg by mouth daily. ) 90 capsule 3  . oxybutynin (DITROPAN) 5 MG tablet Take 5 mg by mouth daily.  1  . telmisartan (MICARDIS) 80 MG  tablet Take 1 tablet (80 mg total) by mouth daily. 90 tablet 1   No current facility-administered medications on file prior to visit.     PAST MEDICAL HISTORY:   Past Medical History:  Diagnosis Date  . Allergy   . Anemia   . Anxiety   . Arthritis    Left knee  . Edema extremities    lower extremity edema left greater than right.  Marland Kitchen GERD (gastroesophageal reflux disease)   . Heart murmur   . History of hiatal hernia    small  . Hx of seasonal allergies    occ. use OTC Mucinex or Antihistamine.  . Hyperlipidemia   . Hypertension   . Mobility impaired    ambulates with walker, tendency to lose balance.  . 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 Neuromuscular disorder ([place])    "multi system atrophy disease"- "pain, bilateral foot numbness, left knee pain"- Dr. Carles Collet is following.  . Shoulder disorder    right shoulder "rigidity due to Multi system atrophy"- ROM not limited"just tires me"    PAST SURGICAL HISTORY:   Past Surgical History:  Procedure Laterality Date  . ESOPHAGOGASTRODUODENOSCOPY ENDOSCOPY     with esophageal dilation  . FOOT SURGERY  2011  3rd metatarsal LT foot  . KNEE ARTHROSCOPY Left 02/08/2015   Procedure: LEFT KNEE ARTHROSCOPY ;  Surgeon: Latanya Maudlin, MD;  Location: [facility];  Service: Orthopedics;  Laterality: Left;  . TONSILLECTOMY AND ADENOIDECTOMY    . TOTAL KNEE ARTHROPLASTY Left 08/22/2015   Procedure: LEFT TOTAL  KNEE  ARTHROPLASTY;  Surgeon: Latanya Maudlin, MD;  Location: [facility];  Service: Orthopedics;  Laterality: Left;    SOCIAL HISTORY:   Social History   Socioeconomic History  . Marital status: Divorced    Spouse name: Not on file  . Number of children: 7  . Years of education: 29  . Highest education level: Not on file  Occupational History  . Occupation: ACTIVITY COORDINATOR    Employer: [employer]  Social Needs  . Financial resource strain:  Not on file  . Food insecurity:    Worry: Not on file    Inability: Not on file  . Transportation needs:    Medical: Not on file    Non-medical: Not on file  Tobacco Use  . Smoking status: Former Smoker    Packs/day: 1.00    Years: 12.00    Pack years: 12.00    Last attempt to quit: 03/14/1988    Years since quitting: 29.8  . Smokeless tobacco: Never Used  Substance and Sexual Activity  . Alcohol use: No  . Drug use: No  . Sexual activity: Not on file  Lifestyle  . Physical activity:    Days per week: Not on file    Minutes per session: Not on file  . Stress: Not on file  Relationships  . Social connections:    Talks on phone: Not on file    Gets together: Not on file    Attends religious service: Not on file    Active member of club or organization: Not on file    Attends meetings of clubs or organizations: Not on file    Relationship status: Not on file  . Intimate partner violence:    Fear of current or ex partner: Not on file    Emotionally abused: Not on file    Physically abused: Not on file    Forced sexual activity: Not on file  Other Topics Concern  . Not on file  Social History Narrative  . Not on file    FAMILY HISTORY:   Family Status  Relation Name Status  . Mother  Deceased at age 14       diabetes, kidney failure, melanoma  . Father  Deceased at age 74       COPD  . Brother  Deceased       brain cancer  . Sister  Alive       healthy  . Daughter  Alive       healthy  . Daughter  Alive       healthy  . Daughter  Alive       healthy  . Daughter  Alive       healthy  . Daughter  Alive       healthy  . Son  Alive       healthy  . Son  Alive       healthy    ROS:  A complete 10 system review of systems was obtained and was unremarkable apart from what is mentioned above.  PHYSICAL EXAMINATION:    VITALS:   Vitals:   01/28/18 1128  BP: (!) 142/88  Pulse:  64  SpO2: 98%  Weight: 245 lb (111.1 kg)  Height: 5\' 8"  (1.727 m)   Wt  Readings from Last 3 Encounters:  01/28/18 245 lb (111.1 kg)  12/12/17 250 lb (113.4 kg)  11/07/17 245 lb (111.1 kg)     GEN:  The patient appears stated age and is in NAD. HEENT:  Normocephalic, atraumatic.  The mucous membranes are moist. The superficial temporal arteries are without ropiness or tenderness.  There is mild R hemifacial spasm CV:  RRR w 3/6 sem Lungs:  CTAB Neck/HEME:  There are no carotid bruits bilaterally. Skin: dry skin noted  Neurological examination:  Orientation: The patient is alert and oriented x3.  Cranial nerves: There is good facial symmetry.  There is R hemifacial spasm.  The visual fields are full to confrontational testing. The speech is fluent and clear. Soft palate rises symmetrically and there is no tongue deviation. Hearing is intact to conversational tone. Sensation: Sensation is intact to light touch throughout. Motor: Strength is 5/5 in the bilateral upper and lower extremities.   Shoulder shrug is equal and symmetric.  There is no pronator drift.   Movement examination: Tone: There is mild rigidity in the RUE Abnormal movements: No tremor noted today.  There is mild dyskinesia of the L leg Coordination:  She had slowing of RAMs with finger taps and hand opening and closing on the right.  They were normal on the L Gait and Station: The patient gets out of the chair by pushing off the chair.  She walks with her walker and walks better with new walker but is leaning over.    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      -continue carbidopa/levodopa 25/100, 1 po 6 times per day.  Told her she could add extra 1-2 tablets prn  -She will continue carbidopa/levodopa 50/200 at night  -will try to contact [facility] as she didn't hear back from them when called 2.  Hemifacial spasm, right  -s/p botox which was beneficial. 3.  Dysphagia.  -She had a MBE on November 19, 2017.  This was unremarkable, with the exception of the tablet of  barium lodged at the distal esophagus at one point.  Mechanical soft diet with thin liquids as recommended.  -She had left TKA on 08/23/15 and she is doing well. 4.  Urinary incontinence  -saw urology and started on vesicare and did well but insurance didn't pay.  Now on detrol.  -ask urologist to see if bladder biofeedback would be helpful.  sees Dr. Vikki Ports in may 5.  RBD  -better with klonopin, 0.5 mg at night. 6.  AM Headache  -Nocturnal polysomnogram done in February, 2019 did not reveal evidence of sleep apnea. 7.  Constipation  -resolved but is having some urge bowel incontinence at times. 8.   uterine fibroids  -made patient referral to [facility]  -told by her RN daughter that should just try OCP's.  Would favor not using them in her age group with limited mobility that she has.  Told her she needs appt with gyn. 9.  inspiratory sigh, by hx  -is associated with MSA but reassured her nothing to worry about.  Didn't note on exam today 10. Dry skin  -commonly associated with MSA.  Try aquaphor or ceravie.

## 2018-01-28 ENCOUNTER — Telehealth: Payer: Self-pay | Admitting: Neurology

## 2018-01-28 ENCOUNTER — Ambulatory Visit: Payer: Medicare Other | Admitting: Neurology

## 2018-01-28 ENCOUNTER — Encounter: Payer: Self-pay | Admitting: Neurology

## 2018-01-28 VITALS — BP 142/88 | HR 64 | Ht 68.0 in | Wt 245.0 lb

## 2018-01-28 DIAGNOSIS — L853 Xerosis cutis: Secondary | ICD-10-CM | POA: Diagnosis not present

## 2018-01-28 DIAGNOSIS — D219 Benign neoplasm of connective and other soft tissue, unspecified: Secondary | ICD-10-CM | POA: Diagnosis not present

## 2018-01-28 DIAGNOSIS — G238 Other specified degenerative diseases of basal ganglia: Secondary | ICD-10-CM | POA: Diagnosis not present

## 2018-01-28 DIAGNOSIS — R1319 Other dysphagia: Secondary | ICD-10-CM | POA: Diagnosis not present

## 2018-01-28 DIAGNOSIS — G5139 Clonic hemifacial spasm, unspecified: Secondary | ICD-10-CM

## 2018-01-28 DIAGNOSIS — N39498 Other specified urinary incontinence: Secondary | ICD-10-CM

## 2018-01-28 MED ORDER — CARBIDOPA-LEVODOPA 25-100 MG PO TABS
ORAL_TABLET | ORAL | 1 refills | Status: DC
Start: 2018-01-28 — End: 2018-10-26

## 2018-01-28 NOTE — Patient Instructions (Addendum)
Try aquaphor on your skin (you can also try ceraVie).    You can take up to 8 Levodopa daily. New prescription sent to pharmacy.   [facility] will take your insurance. We will send a referral and they will call you with an appointment. If you don't hear from them they can be reached at [phone].

## 2018-01-28 NOTE — Telephone Encounter (Signed)
Referral faxed to [place] at [phone] with confirmation received. They will call patient directly with an appt.

## 2018-01-30 ENCOUNTER — Encounter: Payer: Self-pay | Admitting: Neurology

## 2018-02-02 ENCOUNTER — Encounter: Payer: Self-pay | Admitting: Psychology

## 2018-02-02 NOTE — Progress Notes (Signed)
The below information was sent via secure email  to the ACT program and to the [employer] program.  If I do not get a response from them by 4/10 in the am, I will call them. Jacqueline Atkinson responds quicker to e-mail vs telephone message.   Jacqueline Atkinson,  I have a referral for the ACT program. She is an incredible lady- [name] and has a diagnosis of MSA. Dr. Carles Collet and I would like to see her in your program. [name] is very interested as well.  Her phone number is [phone] and email [email or web address]. I copied her to this e-mail so she is aware that I am making this connection.   She will need a scholarship from [employer] for this program. I have copied Ronalee Belts to this e-mail so that process can get started.   Please let me know this was received.  Best, Janett Billow

## 2018-02-04 DIAGNOSIS — Z124 Encounter for screening for malignant neoplasm of cervix: Secondary | ICD-10-CM | POA: Diagnosis not present

## 2018-02-04 DIAGNOSIS — N852 Hypertrophy of uterus: Secondary | ICD-10-CM | POA: Diagnosis not present

## 2018-02-04 DIAGNOSIS — M549 Dorsalgia, unspecified: Secondary | ICD-10-CM | POA: Diagnosis not present

## 2018-02-04 DIAGNOSIS — R102 Pelvic and perineal pain: Secondary | ICD-10-CM | POA: Diagnosis not present

## 2018-02-17 DIAGNOSIS — H25012 Cortical age-related cataract, left eye: Secondary | ICD-10-CM | POA: Diagnosis not present

## 2018-02-23 DIAGNOSIS — R1909 Other intra-abdominal and pelvic swelling, mass and lump: Secondary | ICD-10-CM | POA: Diagnosis not present

## 2018-02-23 DIAGNOSIS — N9489 Other specified conditions associated with female genital organs and menstrual cycle: Secondary | ICD-10-CM | POA: Diagnosis not present

## 2018-03-04 ENCOUNTER — Other Ambulatory Visit: Payer: Self-pay | Admitting: Obstetrics and Gynecology

## 2018-03-04 DIAGNOSIS — R1900 Intra-abdominal and pelvic swelling, mass and lump, unspecified site: Secondary | ICD-10-CM

## 2018-03-05 ENCOUNTER — Telehealth: Payer: Self-pay | Admitting: Physical Therapy

## 2018-03-05 ENCOUNTER — Other Ambulatory Visit: Payer: Self-pay | Admitting: Neurology

## 2018-03-05 DIAGNOSIS — G238 Other specified degenerative diseases of basal ganglia: Secondary | ICD-10-CM

## 2018-03-05 MED ORDER — CARBIDOPA-LEVODOPA ER 50-200 MG PO TBCR: 1.0000 | EXTENDED_RELEASE_TABLET | Freq: Every day | ORAL | 1 refills | Status: DC

## 2018-03-05 NOTE — Telephone Encounter (Signed)
Jacqueline Atkinson is scheduled for PT, OT, speech therapy return evals on 04/07/18.  She agreed to this at her previous discharge.  Could you please send orders via Epic ahead of that scheduled time?  Thank you.  Mady Haagensen, PT

## 2018-03-05 NOTE — Telephone Encounter (Signed)
Orders entered

## 2018-03-06 ENCOUNTER — Ambulatory Visit: Payer: Medicare Other | Admitting: Neurology

## 2018-03-06 ENCOUNTER — Ambulatory Visit (INDEPENDENT_AMBULATORY_CARE_PROVIDER_SITE_OTHER): Payer: Medicare Other | Admitting: Neurology

## 2018-03-06 DIAGNOSIS — G5139 Clonic hemifacial spasm, unspecified: Secondary | ICD-10-CM

## 2018-03-06 DIAGNOSIS — R350 Frequency of micturition: Secondary | ICD-10-CM | POA: Diagnosis not present

## 2018-03-06 DIAGNOSIS — N3946 Mixed incontinence: Secondary | ICD-10-CM | POA: Diagnosis not present

## 2018-03-06 MED ORDER — ONABOTULINUMTOXINA 100 UNITS IJ SOLR: 15.0000 [IU] | Freq: Once | INTRAMUSCULAR | Status: DC

## 2018-03-06 MED ORDER — ONABOTULINUMTOXINA 100 UNITS IJ SOLR
12.5000 [IU] | Freq: Once | INTRAMUSCULAR | Status: AC
  Administered 2018-03-06: 12.5 [IU] via INTRAMUSCULAR

## 2018-03-06 NOTE — Procedures (Signed)
[facility]   History:  Diagnosis: Hemifacial spasm   Initial side: right   Result History  Pt reports that it helped.  Has ptosis of the right eye.    Consent obtained from: The patient Benefits discussed included, but were not limited to decreased muscle tightness, increased joint range of motion, and decreased pain.  Risk discussed included, but were not limited pain and discomfort, bleeding, bruising, excessive weakness, venous thrombosis, muscle atrophy and dysphagia.  A copy of the patient medication guide was given to the patient which explains the blackbox warning.  Patients identity and treatment sites confirmed Yes.  .  Details of Procedure: Skin was cleaned with alcohol.  A 30 gauge, 1/2 inch needle was introduced to target mm.  Prior to injection, the needle plunger was aspirated to make sure the needle was not within a blood vessel.  There was no blood retrieved on aspiration.    Following is a summary of the muscles injected  And the amount of Botulinum toxin used:  Injections  Location Left  Right Units Number of sites        Corrugator      Frontalis      Lower Lid, Lateral  2.5 2.5   Lower Lid Medial      Upper Lid, Lateral  2.5 2.5   Upper Lid, Medial      Canthus  5.0 5.0   nasalis  2.5 2.5   Masseter      Procerus      Zygomaticus Major      TOTAL UNITS:   12.5    Agent: Botulinum Type A ( Onobotulinum Toxin type A ).  1 vials of Botox were used, each containing 50 units and freshly diluted with 2 mL of sterile, non-perserved saline   Total injected (Units): 12.5  Total wasted (Units): 0 Pt tolerated procedure well without complications.   Reinjection is anticipated in 3 months.  Clinical comment:  Did not put toxin in medial eyelids like previous as didn't want to worsen ptosis.  Pt noted pulling near nose and added nasalis.

## 2018-03-07 ENCOUNTER — Telehealth: Payer: Self-pay | Admitting: Family Medicine

## 2018-03-07 NOTE — Telephone Encounter (Signed)
Pt. Called to inform the doctor that the pharmacy did not have refills of her atenelol. Pt. Did not request a refill but also wished to inform the doctor that her BP was stable during the day but "increased signifigantly" at night, pt. Did not specify.

## 2018-03-09 MED ORDER — ATENOLOL 100 MG PO TABS: 100.0000 mg | ORAL_TABLET | Freq: Every day | ORAL | 0 refills | Status: DC

## 2018-03-09 NOTE — Telephone Encounter (Signed)
Phone call to patient to discuss blood pressures.   Patient states her blood pressures are normal throughout the day.  She states she wakes up three times during the night to urinate. When she wakes up with a headache, she checks BP. Numbers are usually 160-170 over 110. Takes one of PRN hydralazine, BP will go down. Having this issue most nights. Issue goes back to October.   Of note, states her feet and ankles swollen. Trying to elevate, not helping.   Taking clonidine every morning and every night as recommended by Dr. Brigitte Pulse  Denies current symptoms.   Needing a refill of atenolol 100mg  sent to [facility] on [address]. No follow up information for medication found in chart.  Patient advised to make office visit to follow up due to new symptom of swelling. Atenolol 100mg  refill for 30 days sent to preferred pharmacy. Patient scheduled for 03/16/2018.

## 2018-03-10 ENCOUNTER — Ambulatory Visit
Admission: RE | Admit: 2018-03-10 | Discharge: 2018-03-10 | Disposition: A | Discharge status: Home / Self Care | Payer: Medicare Other | Source: Ambulatory Visit | Attending: Obstetrics and Gynecology | Admitting: Obstetrics and Gynecology

## 2018-03-10 DIAGNOSIS — N83201 Unspecified ovarian cyst, right side: Secondary | ICD-10-CM | POA: Diagnosis not present

## 2018-03-10 DIAGNOSIS — R1900 Intra-abdominal and pelvic swelling, mass and lump, unspecified site: Secondary | ICD-10-CM

## 2018-03-10 MED ORDER — GADOBENATE DIMEGLUMINE 529 MG/ML IV SOLN
20.0000 mL | Freq: Once | INTRAVENOUS | Status: AC | PRN
  Administered 2018-03-10: 20 mL via INTRAVENOUS

## 2018-03-10 NOTE — Progress Notes (Signed)
Jacqueline Atkinson was seen today in the movement disorders clinic for neurologic consultation at the request of [name].   The patient presents today to the movement disorder clinic at [facility] neurology for consultation regarding possible Parkinson's disease.  I had the opportunity to review records from [name], her prior neurologist.  It appears that the patient has been seeing [name] since 12/29/2013.  Patient reports that she just has not been the same ever since she had a fall in 2011.  Her R foot got caught in a stool and she fell.  She was subsequently placed under the care of orthopedic surgeons and intermittently would go to have fluid drained from her knee.   She developed a R leg drag.  She, therefore, initially attributed difficulty in walking to this injury.  She states that later on, she developed balance issues (cannot say how long that took to develop).  Over the course of time, however, she noticed decreased ability to use the right hand in order to write and began to use the left hand for more tasks.  She stated that she would have to reach across her body to use the mouse (noted that as of Christmas, 2014).  In Feb, she was at work and had a bad HA and then had a CT and subsequent MRI of the brain as they saw a meningioma and so she was referred to [name].  She saw [name] on 12/29/2013.  About a week later on 01/06/2014 she followed up with [name] who had recommended an EMG and this was done and was normal.  On 01/17/2014 [name] started her on levodopa and referred her for another opinion to [facility].  She has not yet had that appointment, but it is scheduled for 06/15/2014.  Pt states that when she first got to the pharmacy, there was a RX there for both and IR and CR tablet (she now thinks that it was accident)  She only picked up the CR and took it bid and stated that it helped her feel "looser" and more steady.  When she f/u with [name], she realized that she was supposed to be on  the IR but when she tried that, she had a lot of nausea and even emesis (tried it with carbs even but initially started with 2 po tid; then went to 1 po 6 times per day and didn't have as much emesis but still had to have the carbs with it and she didn't like all the carb intake).  Therefore, she went back to the CR version on her own and she is only on one tablet at 8am/8pm. She presents today for another opinion.  06/24/14 update:  Pt presents for f/u.  DaT scan was done yesterday.  There was significant reduction in uptake of the radiotracer in the bilateral putamen with activity confined to the caudate.  Pt remains on carbidopa/levodopa 25/100 CR at 8am/8pm.  She has been having more knee pain and went to [facility].  She was told that she needs an MRI but she decided that she didn't want to do an MRI if she wasn't going to have surgery and she didn't want to consider that.  She was given klonopin for insomnia since last visit.  She rarely takes it and said that she only took it a few times.  One of the reasons for insomnia is stiffness associated with the back pain.  Klonopin does help her sleep  when she takes it.  She had one fall since last visit; she was going up the steps and her knee buckled and fell.  She is not sure if her knee caused her to fall.  She is exercising; she is doing yoga twice per week.  She is now able to get off of the floor which she didn't used to be able to do.  She is getting ready to start the PWR exercise class.   09/26/14 update:  Pt returns today for f/u.  I was trying to transition her slowly to the IR formulation of levodopa, which she has had trouble tolerating in the past.  Last visit, I asked her to try to take carbidopa/levodopa 25/100 CR at 8am and 8pm and carbidopa/levodopa 25/100 IR at noon.  She actually moved up the dosage on her own and she is taking carbidopa/levodopa 25/100, 2 at 8am, 2 at noon, 2 at 4pm, and 2 at 8pm.  She may miss one of those dosages and may  only get in 6 pills per day.  She gets really nauseated after 10 minutes but then it gets better if she can get throught that 10 minutes.   MBE was performed on 07/01/14 and it was normal and a regular, thin liquid diet was recommended.  She did got to UC after a fall on knee on 09/13/14. She needs a knee surgery but had to wait until insurance changes.   She is exercising; she is doing yoga twice per week.  C/o a pain in the mid thoracic region that wakes her up in the middle of the night.  Lidoderm has helped but it was very expensive.  Reviewed MRI report from [facility] in 12/2013 and it was normal.  Having more urinary incontinence.  Mood is really good; feels like she is at best place that she has been in virtually all her life.  02/06/15 update:  Pt is accompanied by her sister who supplements the history.  The patient has a history of multiple system atrophy, cerebellar type.  She is on carbidopa/levodopa 25/100.  She is supposed to be on 2 tablets by mouth 4 times per day that she seems to have nausea and emesis when she takes it that way, so she is only taking 1 tablet by mouth 4 times per day.  She is also on Indocin twice a day and is hoping to get off of that after her knee surgery.  She is having knee surgery on Wednesday, which she thinks is the primary etiology for some of her gait issues.  She has been awaiting this knee surgery for a long time.  Her orthopedic surgeon did tell her that he was not sure that it was going to be able to help all of her knee issues, including her gait, however.  No falls.  She went to an ataxia conference in [place]. She asks multiple questions re: genetics.  She is planning on applying to enroll in a MSA-C MRI study involving a 7 Tesla magnet.  She will need to travel to [place] for this.  She has had some urinary incontinence, occasional.  No diplopia.  Walks with cane most of the time.  She also complains that food is getting "stuck" in the midepigastric region when  she is hungry or when she is tired.  This can consist of posterior, meat, or a salad.  She will have to walk around and stretch and sometimes will cough the entire bolus up whole.  She had a modified barium swallow in September of last year that was unremarkable.  06/13/15 update:  The patient is seen back today in follow-up in regard to her multiple system atrophy.  She is on carbidopa/levodopa 25/100, 1 tablet 4 times per day (8am/12pm/4pm/8pm).  She has had trouble tolerating increased dosages because of nausea, but we were previously unsure whether the nausea was from the levodopa or if it was from the Indocin.  Last visit, she stated that she was hopeful to discontinue the Indocin once she had her knee surgery.  She did have this on April 13 under a local block.  I reviewed those records.  She also saw GI since last visit.  I got a note from her gastroenterologist.  She had EGD which revealed evidence of gastric esophageal reflux disease, esophageal stenosis and a medium sized hiatal hernia.  She was started on Nexium, 40 mg twice a day. She states that she has another EGD on sept 10 to hopefully stretch the esophagus. .  She states that they couldn't stretch the esophagus because it was so ulcerated so placed on nexium first.  She is still having trouble with swallowing but thinks that is the esophagus issues.  She is on a soft diet.  Today, the patient states that she is having more cramping at night and more troubles with dexterity in her fingers.  Still having nausea despite off indocin; thinks from levodopa.  10/13/15 update:  The patient is following up today.  She is accompanied by her sister who supplements the history.  I have reviewed records since our last visit.  She has a history of multiple system atrophy.  She increased her carbidopa/levodopa 25/100, 1 tablet 6 times per day and she takes a CR 25/100 at night.  She has noted some rigidity on the right and some tremor on the right but only with  stress or when she is cold.  She thinks that the increase has helped.  She had an EGD in September and I got notes from that.  There were no lesions in the esophagus.  They did a dilation and she continued to have a medium sized hiatal hernia.  She had a left TKA done on 08/23/2015 and subsequently went to a subacute nursing facility for rehabilitation.  She has recovered nicely from that.  She denies any falls since our last visit.  No hallucinations.  No lightheadedness or near syncope.  She has noted a facial twitch on the right, intermittently.  She is having some dry skin.  02/13/16 update:  The patient is following up today.  She is on carbidopa/levodopa 25/100, one tablet 6 times per day (8am/10/2/4/6/8).  She takes carbidopa/levodopa 50/200 CR at bedtime (9-10 pm).  Overall, she feels that she has been stable and doing fairly well.  She denies falls.  She has not had any lightheadedness or near syncope.  No hallucinations.  Little more twitching of th right face.  Last MRI was 10/2013 and that was without gad and meningioma in the L middle cranial fossa.  Noticed that carpets that are "busy" she will lose depth perception.  Having urinary frequency.  Is on diuretic but takes it in the AM.  Asks me to put in DNR orders. States that she has discussed with her daughter and both agree.  Daughter is Marine scientist.  Is working on Systems analyst.  06/14/16 update:  The patient is following up today.  I have reviewed prior records made available  to me.  She did follow up with her primary care physician.  She remains on carbidopa/levodopa 25/100, one tablet 6 times per day in addition to carbidopa/levodopa 50/200 at bedtime.  When she is tired, her R foot will drag.  She has not had any falls since last visit but she has had some near falls when attempting to go backwards.  She knows also that she cannot navigate in the dark.  She walks better with ski poles than with the walker but she has low endurance.  She  denies any hallucinations.  She does have occasional visual distortions.  No lightheadedness or near syncope.  She still have some twitching of the right side of her face.  Mood has been good.   Urinary incontinence has gotten bad enough that she is wearing a pad.  Face has not been twitching as much  11/12/16 update:  Pt f/u today.  This patient is accompanied in the office by her daughter who supplements the history.  She was on carbidopa/levodopa 25/100, one tablet 6 times per day in addition to carbidopa/levodopa 50/200 at bedtime but she states that she got tired of taking pills so she is taking carbidopa/levodopa 50/200 in the AM and at night and carbidopa/levodopa 25/100 tid.  States that she is having "issues" but had these way before meds were changed.   Pt denies falls but near falls especially when leaning over to pick up something.  R foot wants to "stick" to the floor, independent of timing of meds and even if walker is present.   Trouble getting up from chair and bought a lift chair.  Pt denies lightheadedness, near syncope.  No hallucinations.  Mood has been good.  Having some trouble preparing meals - it is too much effort in preparing the meal.  Same is true with housekeeping.  Noting more clumsy and dropping objects but hands not numb.  Going to rock steady boxing and using gait belt there.  Wearing pad all of the time due to incontinence.    Trouble getting in and out of bed.  Trouble turning over in bed - "Im like a beached whale."    03/07/17 update:  Pt seen today in f/u.  Pt on carbidopa/levodopa 25/100 tid and also takes carbidopa/levodopa 50/200 bid. She is having lots of cramping even in her arms.   This is not necessarily how I wanted her to take medication but she thinks that it works as well this way as when she was taking more of the IR tablets.  No falls but some near falls getting up from the lift chair.  Pt denies lightheadedness, near syncope.  No hallucinations.  Mood has been  good.  The patient did go to [facility] and I reviewed notes.  She was started on Vesicare.  She states that it was great but insurance wouldn't pay for it so she was started on detrol.  It helps but not as much as vesicare.  Last visit we also ordered a hospital bed and she reports that she loves it and it has helped.  She has gained weight and plans to start new diet.  She stopped rock steady boxing because of cost  06/27/17 update:  Patient seen today in follow-up for her multiple system atrophy.  This patient is accompanied in the office by her daughter who supplements the history.  I tried to rework her medication last visit as she was having lots of cramping in her arms.  I thought  that was because she was taking the extended release during the day.  I told her to take either carbidopa/levodopa 25/100, 2 tablets 3 times per day or carbidopa/levodopa 25/100 one tablet 6 times per day in addition to her nighttime carbidopa/levodopa 50/200.  She states that she ended up with carbidopa/levodopa 25/100, 1 po 6 times per day but she is still having the cramping in the arms, and then feet at night.  Having more rigidity in the right shoulder and more dexterity issues with the right hand.  Pt denies falls.  Pt denies lightheadedness, near syncope.  No hallucinations.  Mood has been good.  She is having very vivid dreams and screams out in the middle of the night.  Feels that breathing is "laborious" and "hard to do."  But also denies SOB.  She does describe DOE - "I can talk my air out."  Is c/o swelling of feet and ankles.  Tried lasix in the past but had too much urination.  She denies any dysphagia but then tells me she occasionally will throw up food and thinks that is from her hiatal hernia.  11/07/17 update: Patient seen today in follow-up for MSA.  She has been through physical therapy since our last visit.  She can now get off of the floor which she couldn't before.  Not engaged in exercise now.  Wants  to get back in pool.  She remains on carbidopa/levodopa 25/100, 1 tablet 6 times per day and Cardopa/levodopa 50/200 at night.  Patient saw Dr. Melvyn Novas since last visit.  Pulmonary function testing was recommended.  Treatment for reflux was recommended.  She has been following up with primary care regarding elevated blood pressure.  Hydralazine was added.  Subsequently clonidine was added and the dose has been gradually increased to 0.2 mg twice per day.  Waking up with AM headache.  No bedmate.  She doesn't think that she snores.  She occasionally will wake up gasping for air.  She is on klonopin for RBD.  On one occasion she threw her alarm clock across the room.  On another occasion she kicked her dog out of the bed.  Overall, though, she thinks that it is much better.  Having "hip pain."  Saw Dr. Melvyn Novas.  Told some stenosis.  Told had uterine fibroids and Dr. Melvyn Novas thought that this could be issue.  Hasn't been to the gynecologist yet.  Has constipation.  Trying to keep up with it with diet.  Drinking lots of water.  Bladder:  Unable to tell when going.  Eye twitching getting worse and sometimes affecting vision and ability to read.  Normally sees optometry.    01/28/18 update: Patient is seen today in follow-up.  She is accompanied by her sister who supplements the history.  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  She reports that "I can read again and my eye isn't slamming shut."  She is having some twitching of the lower face near mouth and some mild droop but thinks related to twitching.    She is still on carbidopa/levodopa 25/100, 1 tablet 6 times per day and carbidopa/levodopa 50/200 at night.  Noting increasing rigidity.  R foot is dragging and sometimes cemented to floor.  She has not had falls.  She did have a nocturnal polysomnogram on December 12, 2017.  There is no evidence of sleep apnea (AHI was 2.5).  In regards to morning headache, she reports that still having them but thinks related  to  BP.  She notes that BP is very high when wakes up (180s) but then she takes her blood pressure meds and headache goes away.  She had a MBE on November 19, 2017.  This was unremarkable, with the exception of the tablet of barium lodged at the distal esophagus at one point.  Mechanical soft diet with thin liquids as recommended.  No falls.  Not exercising regularly - has called [facility] but no call back.   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  Using diapers now due to incontinence.  Sometimes its a dribble and sometimes its a lot.  Gets feeling she needs to go but will sometimes get there and already be going.  Saw Dr. Valetta Close and told she has cataracts and dry eye and using eye drops 4 times per day.  Tried to go to gyn re: uterine fibroids but couldn't find one that took insurance.  Noting some inspiratory sighs  03/12/18 update: The patient is seen today in follow-up.  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  She reports that it is getting better.  She is still on carbidopa/levodopa 25/100, 1 tablet 6 times per day and carbidopa/levodopa 50/200 at night.  At her request, she was referred to gynecology after our last visit.  She had a normal CA-125 and she had cysts on the ultrasound.  She subsequently had an MRI and this was noted.  She has a large multilobulated cystic mass on the right ovary.  She has an appointment with oncology tomorrow.  She had a urology appointment and those records are reviewed.  She saw Dr. Matilde Sprang on 03/06/18.  She was somewhat better on oxybutinin but not all the way better so myrbetriq was added to that.  She states that she doesn't think that she will be able to afford it after the samples run out.   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  She is working on "goals."  She had to miss the atypical support group the last 2 months because of other commitments.  Noting high BP in the middle of the night.  She wakes up with headache because of it.   Has appt with PCP tomorrow.  She is going to [facility] two days per week.    Neuroimaging has previously been performed.  It is available for my review today.  PREVIOUS MEDICATIONS: Sinemet and Sinemet CR  ALLERGIES:   Allergies  Allergen Reactions  . Sulfa Antibiotics     Long time ago and does not remember reaction    CURRENT MEDICATIONS:  Current Outpatient Medications on File Prior to Visit  Medication Sig Dispense Refill  . acetaminophen (TYLENOL) 500 MG tablet Take 1,000 mg by mouth every 4 (four) hours as needed for moderate pain.     Marland Kitchen atenolol (TENORMIN) 100 MG tablet Take 1 tablet (100 mg total) by mouth daily. 30 tablet 0  . carbidopa-levodopa (SINEMET CR) 50-200 MG tablet Take 1 tablet by mouth at bedtime. 90 tablet 1  . carbidopa-levodopa (SINEMET IR) 25-100 MG tablet TAKE 1 TABLET BY MOUTH 6 TIMES A DAY and 1-2 PRN daily 720 tablet 1  . clonazePAM (KLONOPIN) 0.5 MG tablet Take 1 tablet (0.5 mg total) by mouth at bedtime. 90 tablet 1  . cloNIDine (CATAPRES) 0.2 MG tablet TAKE 1 TABLET BY MOUTH TWICE DAILY 180 tablet 0  . hydrALAZINE (APRESOLINE) 25 MG tablet Take 25 mg by mouth 4 (four) times daily.  3  .  mirabegron ER (MYRBETRIQ) 50 MG TB24 tablet Take 50 mg by mouth daily.    Marland Kitchen omeprazole (PRILOSEC) 40 MG capsule Take 1 capsule (40 mg total) by mouth daily. (Patient taking differently: Take 80 mg by mouth daily. ) 90 capsule 3  . oxybutynin (DITROPAN) 5 MG tablet Take 5 mg by mouth daily.  1  . telmisartan (MICARDIS) 80 MG tablet Take 1 tablet (80 mg total) by mouth daily. 90 tablet 1   No current facility-administered medications on file prior to visit.     PAST MEDICAL HISTORY:   Past Medical History:  Diagnosis Date  . Allergy   . Anemia   . Anxiety   . Arthritis    Left knee  . Edema extremities    lower extremity edema left greater than right.  Marland Kitchen GERD (gastroesophageal reflux disease)   . Heart murmur   . History of hiatal hernia    small  . Hx of seasonal  allergies    occ. use OTC Mucinex or Antihistamine.  . Hyperlipidemia   . Hypertension   . Mobility impaired    ambulates with walker, tendency to lose balance.  . 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 Neuromuscular disorder ([place])    "multi system atrophy disease"- "pain, bilateral foot numbness, left knee pain"- Dr. Carles Collet is following.  . Shoulder disorder    right shoulder "rigidity due to Multi system atrophy"- ROM not limited"just tires me"    PAST SURGICAL HISTORY:   Past Surgical History:  Procedure Laterality Date  . ESOPHAGOGASTRODUODENOSCOPY ENDOSCOPY     with esophageal dilation  . FOOT SURGERY  2011   3rd metatarsal LT foot  . KNEE ARTHROSCOPY Left 02/08/2015   Procedure: LEFT KNEE ARTHROSCOPY ;  Surgeon: Latanya Maudlin, MD;  Location: [facility];  Service: Orthopedics;  Laterality: Left;  . TONSILLECTOMY AND ADENOIDECTOMY    . TOTAL KNEE ARTHROPLASTY Left 08/22/2015   Procedure: LEFT TOTAL  KNEE  ARTHROPLASTY;  Surgeon: Latanya Maudlin, MD;  Location: [facility];  Service: Orthopedics;  Laterality: Left;    SOCIAL HISTORY:   Social History   Socioeconomic History  . Marital status: Divorced    Spouse name: Not on file  . Number of children: 7  . Years of education: 75  . Highest education level: Not on file  Occupational History  . Occupation: ACTIVITY COORDINATOR    Employer: [employer]  Social Needs  . Financial resource strain: Not on file  . Food insecurity:    Worry: Not on file    Inability: Not on file  . Transportation needs:    Medical: Not on file    Non-medical: Not on file  Tobacco Use  . Smoking status: Former Smoker    Packs/day: 1.00    Years: 12.00    Pack years: 12.00    Last attempt to quit: 03/14/1988    Years since quitting: 30.0  . Smokeless tobacco: Never Used  Substance and Sexual Activity  . Alcohol use: No  . Drug use: No  . Sexual activity: Not  on file  Lifestyle  . Physical activity:    Days per week: Not on file    Minutes per session: Not on file  . Stress: Not on file  Relationships  . Social connections:    Talks on phone: Not on file    Gets together: Not on file    Attends  religious service: Not on file    Active member of club or organization: Not on file    Attends meetings of clubs or organizations: Not on file    Relationship status: Not on file  . Intimate partner violence:    Fear of current or ex partner: Not on file    Emotionally abused: Not on file    Physically abused: Not on file    Forced sexual activity: Not on file  Other Topics Concern  . Not on file  Social History Narrative  . Not on file    FAMILY HISTORY:   Family Status  Relation Name Status  . Mother  Deceased at age 65       diabetes, kidney failure, melanoma  . Father  Deceased at age 53       COPD  . Brother  Deceased       brain cancer  . Sister  Alive       healthy  . Daughter  Alive       healthy  . Daughter  Alive       healthy  . Daughter  Alive       healthy  . Daughter  Alive       healthy  . Daughter  Alive       healthy  . Son  Alive       healthy  . Son  Alive       healthy    ROS:  A complete 10 system review of systems was obtained and was unremarkable apart from what is mentioned above.  PHYSICAL EXAMINATION:    VITALS:   Vitals:   03/12/18 1421  BP: 134/84  Pulse: 66  SpO2: 95%  Weight: 243 lb (110.2 kg)  Height: 5\' 8"  (1.727 m)   Wt Readings from Last 3 Encounters:  03/12/18 243 lb (110.2 kg)  01/28/18 245 lb (111.1 kg)  12/12/17 250 lb (113.4 kg)   GEN:  The patient appears stated age and is in NAD. HEENT:  Normocephalic, atraumatic.  The mucous membranes are moist. The superficial temporal arteries are without ropiness or tenderness. CV:  RRR with 3/6 SEM Lungs:  CTAB Neck/HEME:  There are no carotid bruits bilaterally. Skin: dry  Neurological examination:  Orientation: The  patient is alert and oriented x3. Cranial nerves: There is good facial symmetry.  There is rare right hemifacial spasm.  The speech is fluent and clear. Soft palate rises symmetrically and there is no tongue deviation. Hearing is intact to conversational tone. Sensation: Sensation is intact to light touch throughout Motor: Strength is at least antigravity x4.  Movement examination: Tone: There is normal tone today in the upper and lower extremity's. Abnormal movements: There is no tremor or dyskinesia today. Coordination:  There is decremation with RAM's, with any form of RAMS, including alternating supination and pronation of the forearm, hand opening and closing, finger taps, heel taps and toe taps on the right and the feet on the left Gait and Station: The patient ambulates with her walker fairly well.  She is flexed at the waist, but better than in the past due to the newer walker.    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      -continue carbidopa/levodopa 25/100, 1 po 6 times per day.  Told her she could add extra 1-2 tablets prn  -She will continue carbidopa/levodopa 50/200 at night  -Congratulated her on going to [facility]  2.  Hemifacial spasm, right  -s/p botox which was beneficial.  Has another botox scheduled in 3 months. 3.  Dysphagia.  -She had a MBE on November 19, 2017.  This was unremarkable, with the exception of the tablet of barium lodged at the distal esophagus at one point.  Mechanical soft diet with thin liquids as recommended.  -She had left TKA on 08/23/15 and she is doing well. 4.  Urinary incontinence  -saw urology and on oxybutynin.  Myrbetriq just added, but the patient is not sure she is going to be able to afford this.  She has samples for now. 5.  RBD  -better with klonopin, 0.5 mg at night. 6.  AM Headache  -Nocturnal polysomnogram done in February, 2019 did not reveal evidence of sleep apnea.  -Patient thinks it is related to nocturnal  hypertension and she has a follow-up with her primary care physician to discuss.  She is taking for too many Tylenol and we discussed limiting this. 7.  Constipation  -resolved but is having some urge bowel incontinence at times. 8.   Ovarian mass  -Patient has an appointment tomorrow with oncology 9.  inspiratory sigh, by hx  -is associated with MSA but reassured her nothing to worry about.  Didn't note on exam today 10. Dry skin  -commonly associated with MSA.  Try aquaphor or ceravie. 11.  F/u 5-6 months.

## 2018-03-12 ENCOUNTER — Encounter: Payer: Self-pay | Admitting: Neurology

## 2018-03-12 ENCOUNTER — Ambulatory Visit: Payer: Medicare Other | Admitting: Neurology

## 2018-03-12 ENCOUNTER — Telehealth: Payer: Self-pay | Admitting: *Deleted

## 2018-03-12 VITALS — BP 134/84 | HR 66 | Ht 68.0 in | Wt 243.0 lb

## 2018-03-12 DIAGNOSIS — N39498 Other specified urinary incontinence: Secondary | ICD-10-CM | POA: Diagnosis not present

## 2018-03-12 DIAGNOSIS — N839 Noninflammatory disorder of ovary, fallopian tube and broad ligament, unspecified: Secondary | ICD-10-CM | POA: Diagnosis not present

## 2018-03-12 DIAGNOSIS — G238 Other specified degenerative diseases of basal ganglia: Secondary | ICD-10-CM | POA: Diagnosis not present

## 2018-03-12 DIAGNOSIS — G5139 Clonic hemifacial spasm, unspecified: Secondary | ICD-10-CM

## 2018-03-12 DIAGNOSIS — N838 Other noninflammatory disorders of ovary, fallopian tube and broad ligament: Secondary | ICD-10-CM

## 2018-03-12 NOTE — Telephone Encounter (Addendum)
Called Seth Bake at [place] office and left a message with the new patient appt. Also called and spoke with the patient, gave the appt date/time for Friday. Asked the patient to arrive 30 minutes early to fill out some paperwork. Informed the patient about the free valet and she will have a pelvic exam.

## 2018-03-13 ENCOUNTER — Encounter: Payer: Self-pay | Admitting: Family Medicine

## 2018-03-13 ENCOUNTER — Telehealth: Payer: Self-pay | Admitting: Neurology

## 2018-03-13 ENCOUNTER — Other Ambulatory Visit: Payer: Self-pay

## 2018-03-13 ENCOUNTER — Inpatient Hospital Stay: Payer: Medicare Other | Attending: Obstetrics | Admitting: Obstetrics

## 2018-03-13 ENCOUNTER — Encounter: Payer: Self-pay | Admitting: Obstetrics

## 2018-03-13 ENCOUNTER — Ambulatory Visit: Payer: Medicare Other | Admitting: Family Medicine

## 2018-03-13 ENCOUNTER — Telehealth: Payer: Self-pay | Admitting: Oncology

## 2018-03-13 ENCOUNTER — Encounter: Payer: Self-pay | Admitting: Neurology

## 2018-03-13 VITALS — BP 132/75 | HR 60 | Temp 98.1°F | Resp 18 | Ht 68.0 in | Wt 243.0 lb

## 2018-03-13 VITALS — BP 108/66 | HR 64 | Temp 98.4°F | Resp 18 | Ht 68.0 in | Wt 236.0 lb

## 2018-03-13 DIAGNOSIS — R1900 Intra-abdominal and pelvic swelling, mass and lump, unspecified site: Secondary | ICD-10-CM | POA: Diagnosis not present

## 2018-03-13 DIAGNOSIS — Z79899 Other long term (current) drug therapy: Secondary | ICD-10-CM

## 2018-03-13 DIAGNOSIS — G238 Other specified degenerative diseases of basal ganglia: Secondary | ICD-10-CM

## 2018-03-13 DIAGNOSIS — I10 Essential (primary) hypertension: Secondary | ICD-10-CM

## 2018-03-13 DIAGNOSIS — G319 Degenerative disease of nervous system, unspecified: Secondary | ICD-10-CM

## 2018-03-13 DIAGNOSIS — R51 Headache: Secondary | ICD-10-CM

## 2018-03-13 DIAGNOSIS — R519 Headache, unspecified: Secondary | ICD-10-CM

## 2018-03-13 MED ORDER — HYDRALAZINE HCL 50 MG PO TABS: 50.0000 mg | ORAL_TABLET | Freq: Four times a day (QID) | ORAL | 0 refills | Status: DC

## 2018-03-13 MED ORDER — CLONIDINE HCL 0.2 MG PO TABS: ORAL_TABLET | ORAL | 0 refills | Status: DC

## 2018-03-13 MED ORDER — ATENOLOL 100 MG PO TABS: 100.0000 mg | ORAL_TABLET | Freq: Every day | ORAL | 3 refills | Status: DC

## 2018-03-13 NOTE — Patient Instructions (Addendum)
PLEASE CALL IN 1 WEEK OR RIGHT AFTER LABOR DAY TO LET ME KNOW WHAT YOUR BLOOD PRESSURES HAVE BEEN AFTER TAKING 2 TABS OF CLONIDINE AT NIGHT AND 1 IN THE A.M. IF YOU ARE STILL NOTICING THAT YOUR BP OVERNIGHT IS ELEVATED AFTER THIS WEEKEND, ADD IN 50MG  OF THE HYDRALAZINE EVERY NIGHT BEFORE BED AND ANOTHER WHEN YOU WAKE UP IN THE MIDDLE OF THE NIGHT.  IF you received an x-ray today, you will receive an invoice from [facility]. Please contact [facility] at [phone] with questions or concerns regarding your invoice.   IF you received labwork today, you will receive an invoice from [facility]. Please contact [facility] at [phone] with questions or concerns regarding your invoice.   Our billing staff will not be able to assist you with questions regarding bills from these companies.  You will be contacted with the lab results as soon as they are available. The fastest way to get your results is to activate your My Chart account. Instructions are located on the last page of this paperwork. If you have not heard from [place] regarding the results in 2 weeks, please contact this office.     Managing Your Hypertension Hypertension is commonly called high blood pressure. This is when the force of your blood pressing against the walls of your arteries is too strong. Arteries are blood vessels that carry blood from your heart throughout your body. Hypertension forces the heart to work harder to pump blood, and may cause the arteries to become narrow or stiff. Having untreated or uncontrolled hypertension can cause heart attack, stroke, kidney disease, and other problems. What are blood pressure readings? A blood pressure reading consists of a higher number over a lower number. Ideally, your blood pressure should be below 120/80. The first ("top") number is called the systolic pressure. It is a measure of the pressure in your arteries as your heart beats. The second ("bottom") number is called  the diastolic pressure. It is a measure of the pressure in your arteries as the heart relaxes. What does my blood pressure reading mean? Blood pressure is classified into four stages. Based on your blood pressure reading, your health care provider may use the following stages to determine what type of treatment you need, if any. Systolic pressure and diastolic pressure are measured in a unit called mm Hg. Normal  Systolic pressure: below 557.  Diastolic pressure: below 80. Elevated  Systolic pressure: 322-025.  Diastolic pressure: below 80. Hypertension stage 1  Systolic pressure: 427-062.  Diastolic pressure: 37-62. Hypertension stage 2  Systolic pressure: 831 or above.  Diastolic pressure: 90 or above. What health risks are associated with hypertension? Managing your hypertension is an important responsibility. Uncontrolled hypertension can lead to:  A heart attack.  A stroke.  A weakened blood vessel (aneurysm).  Heart failure.  Kidney damage.  Eye damage.  Metabolic syndrome.  Memory and concentration problems.  What changes can I make to manage my hypertension? Hypertension can be managed by making lifestyle changes and possibly by taking medicines. Your health care provider will help you make a plan to bring your blood pressure within a normal range. Eating and drinking  Eat a diet that is high in fiber and potassium, and low in salt (sodium), added sugar, and fat. An example eating plan is called the DASH (Dietary Approaches to Stop Hypertension) diet. To eat this way: ? Eat plenty of fresh fruits and vegetables. Try to fill half of your plate at each meal  with fruits and vegetables. ? Eat whole grains, such as whole wheat pasta, brown rice, or whole grain bread. Fill about one quarter of your plate with whole grains. ? Eat low-fat diary products. ? Avoid fatty cuts of meat, processed or cured meats, and poultry with skin. Fill about one quarter of your plate  with lean proteins such as fish, chicken without skin, beans, eggs, and tofu. ? Avoid premade and processed foods. These tend to be higher in sodium, added sugar, and fat.  Reduce your daily sodium intake. Most people with hypertension should eat less than 1,500 mg of sodium a day.  Limit alcohol intake to no more than 1 drink a day for nonpregnant women and 2 drinks a day for men. One drink equals 12 oz of beer, 5 oz of wine, or 1 oz of hard liquor. Lifestyle  Work with your health care provider to maintain a healthy body weight, or to lose weight. Ask what an ideal weight is for you.  Get at least 30 minutes of exercise that causes your heart to beat faster (aerobic exercise) most days of the week. Activities may include walking, swimming, or biking.  Include exercise to strengthen your muscles (resistance exercise), such as weight lifting, as part of your weekly exercise routine. Try to do these types of exercises for 30 minutes at least 3 days a week.  Do not use any products that contain nicotine or tobacco, such as cigarettes and e-cigarettes. If you need help quitting, ask your health care provider.  Control any long-term (chronic) conditions you have, such as high cholesterol or diabetes. Monitoring  Monitor your blood pressure at home as told by your health care provider. Your personal target blood pressure may vary depending on your medical conditions, your age, and other factors.  Have your blood pressure checked regularly, as often as told by your health care provider. Working with your health care provider  Review all the medicines you take with your health care provider because there may be side effects or interactions.  Talk with your health care provider about your diet, exercise habits, and other lifestyle factors that may be contributing to hypertension.  Visit your health care provider regularly. Your health care provider can help you create and adjust your plan for  managing hypertension. Will I need medicine to control my blood pressure? Your health care provider may prescribe medicine if lifestyle changes are not enough to get your blood pressure under control, and if:  Your systolic blood pressure is 130 or higher.  Your diastolic blood pressure is 80 or higher.  Take medicines only as told by your health care provider. Follow the directions carefully. Blood pressure medicines must be taken as prescribed. The medicine does not work as well when you skip doses. Skipping doses also puts you at risk for problems. Contact a health care provider if:  You think you are having a reaction to medicines you have taken.  You have repeated (recurrent) headaches.  You feel dizzy.  You have swelling in your ankles.  You have trouble with your vision. Get help right away if:  You develop a severe headache or confusion.  You have unusual weakness or numbness, or you feel faint.  You have severe pain in your chest or abdomen.  You vomit repeatedly.  You have trouble breathing. Summary  Hypertension is when the force of blood pumping through your arteries is too strong. If this condition is not controlled, it may put you at  risk for serious complications.  Your personal target blood pressure may vary depending on your medical conditions, your age, and other factors. For most people, a normal blood pressure is less than 120/80.  Hypertension is managed by lifestyle changes, medicines, or both. Lifestyle changes include weight loss, eating a healthy, low-sodium diet, exercising more, and limiting alcohol. This information is not intended to replace advice given to you by your health care provider. Make sure you discuss any questions you have with your health care provider. Document Released: 07/08/2012 Document Revised: 09/11/2016 Document Reviewed: 09/11/2016 Elsevier Interactive Patient Education  [employer].

## 2018-03-13 NOTE — Progress Notes (Addendum)
By signing my name below, I, Mayer Masker, attest that this documentation has been prepared under the direction and in the presence of Brigitte Pulse Laurey Arrow, MD. Electronically Signed: Mayer Masker, Medical Scribe 03/13/2018 at 5:59 PM.  Subjective:    Patient ID: Jacqueline Atkinson, female    DOB: May 20, 1956, 62 y.o.   MRN: [id number]  HPI Chief Complaint  Patient presents with  . Hypertension    Pt states she has brought doc of BPs. Pt states BP are good during the day and high at night.  . Follow-up   Jacqueline Atkinson is a 62 y.o. female who presents to Primary Care at [facility] for [place] of blood pressure.   She states during the day her BP is well-controlled, but at night, she will check and it will be elevated at 178/130, 147/107, 167/104, 155/106, but these improve throughout the day. Her pulse has remained the same. She has a HA in the mornings since October/November. She had a sleep test ordered by neurology with results WNL. She cut out caffeine but still has these issues.   Her HR was 80s-90s after being off atenolol for a week, but after restarting this, it went back to 60s.  She is taking 0.2 mg clonidine regularly. She is also taking hydralazine as directed. She will take this PRN and monitors her BP to see if she will take her dose or not. She denies symptoms of lows during the day. She wakes up every 2-3 hours during the night in order to use the bathroom.    Per pt she has a "huge" (17cm) ovarian cyst that could be causing her bladder and GI problems. She has constipation.   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   She denies any urinary symptoms.   Past Medical History:  Diagnosis Date  . Anemia    hx of  . Anxiety   . Arthritis    Left knee  osteoarthritis  . GERD (gastroesophageal reflux disease)   . Heart murmur   . History of hiatal hernia    small  . Hx of seasonal allergies    occ. use OTC  Mucinex or Antihistamine.  . Hyperlipidemia   . Hypertension   . Neuromuscular disorder ([place])    "multi system atrophy disease"- "pain, bilateral foot numbness, left knee pain"- Dr. Carles Collet is following.  . Ovarian cancer on left ([place]) 2019   serous borderline s/p BSO  . Pneumonia   . Shoulder disorder    right shoulder "rigidity due to Multi system atrophy"- ROM not limited"just tires me"   Past Surgical History:  Procedure Laterality Date  . ABDOMINAL HYSTERECTOMY     04-02-18 Dr. Gerarda Fraction  . CYSTOSCOPY WITH RETROGRADE PYELOGRAM, URETEROSCOPY AND STENT PLACEMENT Bilateral 05/04/2018   Procedure: CYSTOSCOPY WITH RETROGRADE PYELOGRAM, URETEROSCOPY AND STENT PLACEMENT;  Surgeon: Cleon Gustin, MD;  Location[facility];  Service: Urology;  Laterality: Bilateral;  . DILATION AND CURETTAGE, DIAGNOSTIC / THERAPEUTIC     1981 and 1998 due to missed AB  . ESOPHAGOGASTRODUODENOSCOPY ENDOSCOPY     with esophageal dilation  . FOOT SURGERY  2011   3rd metatarsal LT foot  . KNEE ARTHROSCOPY Left 02/08/2015   Procedure: LEFT KNEE ARTHROSCOPY ;  Surgeon: Latanya Maudlin, MD;  Location: [facility];  Service: Orthopedics;  Laterality: Left;  . LAPAROTOMY N/A 04/02/2018   Procedure: EXPLORATORY LAPAROTOMY;  Surgeon: Isabel Caprice,  MD;  Location: [facility];  Service: Gynecology;  Laterality: N/A;  . SALPINGOOPHORECTOMY Bilateral 04/02/2018   Procedure: BILATERAL SALPINGO OOPHORECTOMY;  Surgeon: Isabel Caprice, MD;  Location: [facility];  Service: Gynecology;  Laterality: Bilateral;  . TONSILLECTOMY AND ADENOIDECTOMY    . TOTAL KNEE ARTHROPLASTY Left 08/22/2015   Procedure: LEFT TOTAL  KNEE  ARTHROPLASTY;  Surgeon: Latanya Maudlin, MD;  Location: [facility];  Service: Orthopedics;  Laterality: Left;   Current Outpatient Medications on File Prior to Visit  Medication Sig Dispense Refill  . acetaminophen (TYLENOL) 500 MG tablet Take 1,000 mg by mouth every 8 (eight) hours as needed for moderate pain.       . carbidopa-levodopa (SINEMET CR) 50-200 MG tablet Take 1 tablet by mouth at bedtime. 90 tablet 1  . carbidopa-levodopa (SINEMET IR) 25-100 MG tablet TAKE 1 TABLET BY MOUTH 6 TIMES A DAY and 1-2 PRN daily (Patient taking differently: Take 1 tablet by mouth See admin instructions. Take 1 tablet by mouth six times daily) 720 tablet 1  . mirabegron ER (MYRBETRIQ) 50 MG TB24 tablet Take 50 mg by mouth daily.    Marland Kitchen oxybutynin (DITROPAN) 5 MG tablet Take 5 mg by mouth 2 (two) times daily.   1   No current facility-administered medications on file prior to visit.    Allergies  Allergen Reactions  . Sulfa Antibiotics     Long time ago and does not remember reaction   Family History  Problem Relation Age of Onset  . Diabetes Mother   . Kidney disease Mother   . Melanoma Mother        arm  . COPD Father   . Brain cancer Brother   . Melanoma Daughter    Social History   Socioeconomic History  . Marital status: Divorced    Spouse name: Not on file  . Number of children: 7  . Years of education: 2  . Highest education level: Not on file  Occupational History  . Occupation: ACTIVITY COORDINATOR    Employer: [employer]  Social Needs  . Financial resource strain: Not on file  . Food insecurity:    Worry: Not on file    Inability: Not on file  . Transportation needs:    Medical: Not on file    Non-medical: Not on file  Tobacco Use  . Smoking status: Former Smoker    Packs/day: 1.00    Years: 12.00    Pack years: 12.00    Last attempt to quit: 03/14/1988    Years since quitting: 30.4  . Smokeless tobacco: Never Used  Substance and Sexual Activity  . Alcohol use: No  . Drug use: No    Comment: edible ebrownies  months ago for pain  . Sexual activity: Not Currently  Lifestyle  . Physical activity:    Days per week: Not on file    Minutes per session: Not on file  . Stress: Not on file  Relationships  . Social connections:    Talks on phone: Not on file     Gets together: Not on file    Attends religious service: Not on file    Active member of club or organization: Not on file    Attends meetings of clubs or organizations: Not on file    Relationship status: Not on file  Other Topics Concern  . Not on file  Social History Narrative  . Not on file   Depression screen [facility] 2/9 03/13/2018 08/22/2017  08/16/2017 08/04/2017 07/02/2017  Decreased Interest 0 0 0 0 0  Down, Depressed, Hopeless 0 0 0 0 0  PHQ - 2 Score 0 0 0 0 0      Review of Systems  Constitutional: Negative for fever.  Eyes: Positive for visual disturbance.  Respiratory: Negative for shortness of breath.   Cardiovascular: Negative for chest pain.  Gastrointestinal: Positive for constipation.  Genitourinary: Positive for frequency. Negative for difficulty urinating, dysuria and hematuria.       Nocturia  Musculoskeletal: Positive for back pain (chronic).  Neurological: Positive for headaches. Negative for syncope.       Objective:   Physical Exam  Constitutional: She is oriented to person, place, and time. She appears well-developed and well-nourished. No distress.  HENT:  Head: Normocephalic and atraumatic.  Right Ear: External ear normal.  Left Ear: External ear normal.  Eyes: Conjunctivae are normal. No scleral icterus.  Neck: Normal range of motion. Neck supple. No thyromegaly present.  Cardiovascular: Normal rate, regular rhythm, normal heart sounds and intact distal pulses.  Pulses:      Dorsalis pedis pulses are 1+ on the left side.  Systolic ejection murmur, left sternal border  Pulmonary/Chest: Effort normal and breath sounds normal. No respiratory distress.  Musculoskeletal: She exhibits no edema.  Lymphadenopathy:    She has no cervical adenopathy.  Neurological: She is alert and oriented to person, place, and time.  Skin: Skin is warm and dry. She is not diaphoretic. No erythema.  Psychiatric: She has a normal mood and affect. Her behavior is normal.    Vitals:   03/13/18 1653  BP: 108/66  Pulse: 64  Resp: 18  Temp: 98.4 F (36.9 C)  TempSrc: Oral  SpO2: 97%  Weight: 236 lb (107 kg)  Height: 5\' 8"  (1.727 m)     General: Well Developed, well nourished, and in no acute distress.  HEENT: Normocephalic, atraumatic Skin: Warm and dry,  Chest:  Normal excursion, shape, no gross abn Respiratory: speaking in full sentences, no conversational dyspnea NeuroM-Sk: Ambulates w/o assistance, moves * 4 Psych: A and O *3, insight good, mood-full     Assessment & Plan:   1. Malignant hypertension   2. Intractable episodic headache, unspecified headache type   3. Multiple system atrophy C ([place])   4. Polypharmacy     Meds ordered this encounter  Medications  . cloNIDine (CATAPRES) 0.2 MG tablet    Sig: Take 1 tab po qam and 2 tabs po qhs    Dispense:  360 tablet    Refill:  0  . hydrALAZINE (APRESOLINE) 50 MG tablet    Sig: Take 1 tablet (50 mg total) by mouth 4 (four) times daily. As needed for elevated BP and take 1 po qhs    Dispense:  360 tablet    Refill:  0  . atenolol (TENORMIN) 100 MG tablet    Sig: Take 1 tablet (100 mg total) by mouth daily.    Dispense:  90 tablet    Refill:  3   I personally performed the services described in this documentation, which was scribed in my presence. The recorded information has been reviewed and considered, and addended by me as needed.   [name], M.D.  Primary Care at [facility] [address] [place],  [phone] phone [phone] fax  08/05/18 11:24 PM

## 2018-03-13 NOTE — Telephone Encounter (Signed)
Left a message with Dr. Doristine Devoid office regarding medical clearance for surgery.  Requested a return call.

## 2018-03-13 NOTE — Patient Instructions (Addendum)
We will need to obtain neurology clearance prior to surgery.                Preparing for your Surgery  Plan for surgery in the middle of June after your conference with Dr. Precious Haws at [place] will be scheduled for an exploratory laparotomy, bilateral salpingo-oophorectomy, possible total abdominal hysterectomy, possible staging.  We will contact you after neuro clearance has been obtained to select a date.  Pre-operative Testing -You will receive a phone call from presurgical testing at [facility] to arrange for a pre-operative testing appointment before your surgery.  This appointment normally occurs one to two weeks before your scheduled surgery.   -Bring your insurance card, copy of an advanced directive if applicable, medication list  -At that visit, you will be asked to sign a consent for a possible blood transfusion in case a transfusion becomes necessary during surgery.  The need for a blood transfusion is rare but having consent is a necessary part of your care.     -You should not be taking blood thinners or aspirin at least ten days prior to surgery unless instructed by your surgeon.  Day Before Surgery at [place] will be asked to take in a light diet the day before surgery.  Avoid carbonated beverages.  You will be advised to have nothing to eat or drink after midnight the evening before.    Eat a light diet the day before surgery.  Examples including soups, broths, toast, yogurt, mashed potatoes.  Things to avoid include carbonated beverages (fizzy beverages), raw fruits and raw vegetables, or beans.   If your bowels are filled with gas, your surgeon will have difficulty visualizing your pelvic organs which increases your surgical risks.  Your role in recovery Your role is to become active as soon as directed by your doctor, while still giving yourself time to heal.  Rest when you feel tired. You will be asked to do the following in order to  speed your recovery:  - Cough and breathe deeply. This helps toclear and expand your lungs and can prevent pneumonia. You may be given a spirometer to practice deep breathing. A staff member will show you how to use the spirometer. - Do mild physical activity. Walking or moving your legs help your circulation and body functions return to normal. A staff member will help you when you try to walk and will provide you with simple exercises. Do not try to get up or walk alone the first time. - Actively manage your pain. Managing your pain lets you move in comfort. We will ask you to rate your pain on a scale of zero to 10. It is your responsibility to tell your doctor or nurse where and how much you hurt so your pain can be treated.  Special Considerations -If you are diabetic, you may be placed on insulin after surgery to have closer control over your blood sugars to promote healing and recovery.  This does not mean that you will be discharged on insulin.  If applicable, your oral antidiabetics will be resumed when you are tolerating a solid diet.  -Your final pathology results from surgery should be available by the Friday after surgery and the results will be relayed to you when available.  -Dr. Lahoma Crocker is the Surgeon that assists your GYN Oncologist with surgery.  The next day after your surgery you will either see your GYN Oncologist or Dr. Lahoma Crocker.  Blood Transfusion Information WHAT IS A BLOOD TRANSFUSION? A transfusion is the replacement of blood or some of its parts. Blood is made up of multiple cells which provide different functions.  Red blood cells carry oxygen and are used for blood loss replacement.  White blood cells fight against infection.  Platelets control bleeding.  Plasma helps clot blood.  Other blood products are available for specialized needs, such as hemophilia or other clotting disorders. BEFORE THE TRANSFUSION  Who gives blood for  transfusions?   You may be able to donate blood to be used at a later date on yourself (autologous donation).  Relatives can be asked to donate blood. This is generally not any safer than if you have received blood from a stranger. The same precautions are taken to ensure safety when a relative's blood is donated.  Healthy volunteers who are fully evaluated to make sure their blood is safe. This is blood bank blood. Transfusion therapy is the safest it has ever been in the practice of medicine. Before blood is taken from a donor, a complete history is taken to make sure that person has no history of diseases nor engages in risky social behavior (examples are intravenous drug use or sexual activity with multiple partners). The donor's travel history is screened to minimize risk of transmitting infections, such as malaria. The donated blood is tested for signs of infectious diseases, such as HIV and hepatitis. The blood is then tested to be sure it is compatible with you in order to minimize the chance of a transfusion reaction. If you or a relative donates blood, this is often done in anticipation of surgery and is not appropriate for emergency situations. It takes many days to process the donated blood. RISKS AND COMPLICATIONS Although transfusion therapy is very safe and saves many lives, the main dangers of transfusion include:   Getting an infectious disease.  Developing a transfusion reaction. This is an allergic reaction to something in the blood you were given. Every precaution is taken to prevent this. The decision to have a blood transfusion has been considered carefully by your caregiver before blood is given. Blood is not given unless the benefits outweigh the risks.

## 2018-03-13 NOTE — Telephone Encounter (Signed)
Appeal letter faxed to [facility] at [phone] with confirmation received.

## 2018-03-13 NOTE — Telephone Encounter (Signed)
Jacqueline Atkinson called from [facility] to let you know that her Clonazepam has been Denied. If any questions please call at [phone]. Thanks

## 2018-03-13 NOTE — Telephone Encounter (Signed)
Received vm.  Dr. Carles Collet - please advise if clearance would be same for [facility] patient?  Okay for surgery. Take medication the morning of surgery if able. If nausea, zofran > phenergan. Please advise.

## 2018-03-14 ENCOUNTER — Encounter: Payer: Self-pay | Admitting: Obstetrics

## 2018-03-14 DIAGNOSIS — R1900 Intra-abdominal and pelvic swelling, mass and lump, unspecified site: Secondary | ICD-10-CM | POA: Insufficient documentation

## 2018-03-14 NOTE — H&P (View-Only) (Signed)
Consult Note: Gyn-Onc  Consult was requested by Dr. Tiana Loft  CC:  Chief Complaint  Patient presents with  . Pelvic mass    HPI: [name]  is a very nice 62 y.o. P7  Beginning October 2018 the patient noted some left lower back pain which worsened with standing.  Specifically it has been affecting her ADLs by interfering with her cooking meals.  At first she thought this was related to problems with her gait or her bony structure and she followed up with both physical therapy and orthopedics who felt that further work-up should be undertaken.  Recommendation was for imaging and a CT scan was ordered by her orthopedic surgeon December 2018.  She was informed that there was a GYN process for which she should follow-up with gynecology however she postponed this as she felt that this would not be the explanation for the pain.  Ultimately she did follow-up with Dr. Tiana Loft.  Initial thought process was this may be a fibroid uterus and ultrasound was performed.  This revealed normal uterus but a large cystic structure possibly both one on the right measuring 8 cm and another on the left measuring 8 cm uncertain origin possibly bowel in nature and therefore an MRI was requested.  MRI revealed normal uterus with normal endometrial lining however a large multiloculated cystic mass was noted with numerous internal enhancing septations.  A small unilocular cyst was noted in the left ovary measuring 2.7 cm but the main mass up to 14 x 9 x 17 cm.  No free fluid or ascites.  There are internal areas of enhancing septations which are mildly thickened in the larger mass.  A Ca1 25 is normal at 13.1 on 02/23/2018.  In addition a CEA is noted to be normal at 1.2.  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  She states is a progressive  neurologic disease and most people end up in a wheelchair possibly succumbed to their disease less than 10 years from the time of diagnosis.  She does currently have limited ambulation ability and presents today with a walker.  She is mostly concerned about her quality of life specifically the discomfort with standing for long periods while she is cooking.  Of less concern is the potential for malignancy as she does worry that treatment of such a process would decrease the quality of life of the possibly limited time she has left.  She denies nausea and vomiting she has had no significant changes in her body weight.  She has noted decreased appetite and some early satiety.  Her normal bowel movements are every 4 to 5 days and that has not changed.  She has noted some bladder spasms over the past year.  Pain is noted to be lower back upper left abdomen into her thighs 3 out of 10 which lasts in varying intervals Tylenol and rest relieves the pain feels like an ache standing aggravates the pain.  She has some concerns about general anesthesia and asked me if surgery can be done without it.  She is unsure what the side effects of her sulfa allergy ER.  She received this as a teenager.  Her neurologist for her degenerative disorder is Wells Guiles Tat, DO  Oncologic History: Pending further work-up   No history exists.    Measurement of disease:  No results for input(s): CA125, CAN125, CEA, CA199, ESTRADIOL, INHBB in the last 8760 hours.  Invalid input(s): INHIBINA  Pending further work-up . Preoperative Ca1 25 equal 13.3, CEA 1.2  Radiology: . 10/02/2017-CT pelvis-[facility]-left ovary unremarkable right ovary not visualized multiple complex low-density structures arising from the uterus which may represent fibroids the largest measuring 9 cm. . 02/23/2018-transvaginal transabdominal ultrasound [facility]-large simple multicystic areas bilaterally within the adnexa felt to be ovarian in nature left 8  x 5 x 6.8, left 7.8 x 5.9 x 5.2.  Vascularity is noted in the septations between the cyst?  On transabdominal imaging there is a midline simple cyst 8.5 x 7 x 9.4. Marland Kitchen 02/28/2018-MRI pelvis-[facility]-uterus 5.6 x 4.8 x 5.2 with an unremarkable endometrium.  Large multiloculated cystic mass 14.9 x 9.9 x 17.1.  On previous exam which is the CT scan from December 2018 this is increased in size.  There are numerous internal areas of enhancing septation some of the septations are thin others are irregular and thickened up to 3 mm.  In the dominant locule there are multiple tiny mural nodules up to 3 mm.  There is a small unilocular cyst in the left ovary 2.7 cm.  No ascites or peritoneal nodularity identified.  Current Meds:  Outpatient Encounter Medications as of 03/13/2018  Medication Sig  . acetaminophen (TYLENOL) 500 MG tablet Take 1,000 mg by mouth every 4 (four) hours as needed for moderate pain.   . carbidopa-levodopa (SINEMET CR) 50-200 MG tablet Take 1 tablet by mouth at bedtime.  . carbidopa-levodopa (SINEMET IR) 25-100 MG tablet TAKE 1 TABLET BY MOUTH 6 TIMES A DAY and 1-2 PRN daily  . clonazePAM (KLONOPIN) 0.5 MG tablet Take 1 tablet (0.5 mg total) by mouth at bedtime.  . mirabegron ER (MYRBETRIQ) 50 MG TB24 tablet Take 50 mg by mouth daily.  Marland Kitchen omeprazole (PRILOSEC) 40 MG capsule Take 1 capsule (40 mg total) by mouth daily. (Patient taking differently: Take 80 mg by mouth daily. )  . oxybutynin (DITROPAN) 5 MG tablet Take 5 mg by mouth daily.  Marland Kitchen telmisartan (MICARDIS) 80 MG tablet Take 1 tablet (80 mg total) by mouth daily.  . [DISCONTINUED] atenolol (TENORMIN) 100 MG tablet Take 1 tablet (100 mg total) by mouth daily.  . [DISCONTINUED] cloNIDine (CATAPRES) 0.2 MG tablet TAKE 1 TABLET BY MOUTH TWICE DAILY  . [DISCONTINUED] hydrALAZINE (APRESOLINE) 25 MG tablet Take 25 mg by mouth 4 (four) times daily.   No facility-administered encounter medications on file as of 03/13/2018.     Allergy:  Allergies   Allergen Reactions  . Sulfa Antibiotics     Long time ago and does not remember reaction     Social Hx:  Tobacco use: 18-pack-year quit in 1989 Alcohol use: None Illicit Drug use: None Illicit IV Drug use: None    Past Surgical Hx:  Past Surgical History:  Procedure Laterality Date  . DILATION AND CURETTAGE, DIAGNOSTIC / THERAPEUTIC     1981 and 1998 due to missed AB  . ESOPHAGOGASTRODUODENOSCOPY ENDOSCOPY     with esophageal dilation  . FOOT SURGERY  2011   3rd metatarsal LT foot  . KNEE ARTHROSCOPY Left 02/08/2015   Procedure: LEFT KNEE ARTHROSCOPY ;  Surgeon: Latanya Maudlin, MD;  Location: [facility];  Service: Orthopedics;  Laterality: Left;  . TONSILLECTOMY AND ADENOIDECTOMY    . TOTAL KNEE ARTHROPLASTY Left 08/22/2015   Procedure: LEFT TOTAL  KNEE  ARTHROPLASTY;  Surgeon: Latanya Maudlin, MD;  Location: [facility];  Service: Orthopedics;  Laterality: Left;    Past Medical Hx:  Past Medical History:  Diagnosis Date  . Anemia   . Anxiety   . Arthritis    Left knee  . Edema extremities    lower extremity edema left greater than right.  Marland Kitchen GERD (gastroesophageal reflux disease)   . Heart murmur   . History of hiatal hernia    small  . Hx of seasonal allergies    occ. use OTC Mucinex or Antihistamine.  . Hyperlipidemia   . Hypertension   . Mobility impaired    ambulates with walker, tendency to lose balance.  . Neuromuscular disorder ([place])    "multi system atrophy disease"- "pain, bilateral foot numbness, left knee pain"- Dr. Carles Collet is following.  . Shoulder disorder    right shoulder "rigidity due to Multi system atrophy"- ROM not limited"just tires me"    Past Gynecological History:   GYNECOLOGIC HISTORY:  No LMP recorded. Patient is postmenopausal. Menarche: 62 years old P 7; +5 miscarriages LMP 2008 Contraceptive oral contraceptives less than 10 years also used a diaphragm in the past HRT none  Last Pap 01/2018 negative for malignancy satisfactory for evaluation  no HPV performed  Family Hx:  Family History  Problem Relation Age of Onset  . Diabetes Mother   . Kidney disease Mother   . Melanoma Mother        arm  . COPD Father   . Brain cancer Brother   . Melanoma Daughter     Review of Systems:  Review of Systems  Eyes: Positive for eye problems.  Gastrointestinal: Positive for abdominal distention, abdominal pain and constipation.  Genitourinary: Positive for bladder incontinence.   Musculoskeletal: Positive for back pain and gait problem.  Neurological: Positive for extremity weakness, gait problem, headaches and numbness.  All other systems reviewed and are negative.    Vitals:  Blood pressure 132/75, pulse 60, temperature 98.1 F (36.7 C), temperature source Oral, resp. rate 18, height 5\' 8"  (1.727 m), weight 243 lb (110.2 kg), SpO2 96 %. Body mass index is 36.95 kg/m.   Physical Exam: ECOG PERFORMANCE STATUS: 2 - Symptomatic, <50% confined to bed   General :  Well developed, 62 y.o., female in no apparent distress HEENT:  Normocephalic/atraumatic, symmetric, EOMI, eyelids normal Neck:   Supple, no masses.  Lymphatics:  No cervical/ submandibular/ supraclavicular/ infraclavicular/ inguinal adenopathy Respiratory:  Respirations unlabored, no use of accessory muscles CV:   Deferred Breast:  Deferred Musculoskeletal: No CVA tenderness, normal muscle strength. Abdomen:  Overweight.  Soft, non-tender and nondistended. No evidence of hernia. No masses but limited exam due to the habitus. Extremities:  No lymphedema, no erythema, non-tender. Skin:   Normal inspection Neuro/Psych:  No focal motor deficit, no abnormal mental status. Normal gait. Normal affect. Alert and oriented to person, place, and time  Genito Urinary: Vulva: Normal external female genitalia.  Bladder/urethra: Urethral meatus normal in size and location. No lesions or   masses, well supported bladder Speculum exam: Vagina: No lesion, no discharge, no  bleeding. Cervix: Normal appearing with visualization of transformation zone, no lesions. Bimanual exam:  Uterus: Habitus limits examination of size, mobile.  Adnexa: No masses. Rectovaginal:  Good tone, no masses, no cul de sac nodularity, no parametrial involvement or nodularity.  Oncologic Summary: 1. Large pelvic mass 2. Neuro degenerative disorder   Assessment/Plan: 1. Pelvic mass ? Imaging of the pelvis reveals a large pelvic mass ? Imaging of the abdomen has not been been  performed as of yet ? Tumor markers are reassuring including Ca1 25 and CEA ? We reviewed factors supporting observation which is her desire for lack of aggressive management in the face of limited life expectancy.  However I did discuss the growth of the lesion between the 2 scans and the fact this may continue to increase in size and worsen her symptoms. ? Factors reviewed supporting surgical management her symptoms which are affecting her activities of daily living including standing and cooking.  In addition I cannot say with certainty that this is not a malignant lesion.  I do not suspect a highly malignant process borderline process is a possibility and these etiologies were all discussed with the patient.  It is difficult to determine with 948% certainty the natural course of the process without knowing the histology. 2. Management ? Of the options of observation versus surgery she prefers surgical management and I think that is reasonable ? Given the large size of the lesion I have recommended an open approach 3. Preoperative items will include  ? I have a low suspicion for malignancy I do not feel that chest x-ray will contribute to her management at this time and will defer to anesthesia ? Negative Pap smear is on record from April 2019 ? Neurologic clearance will be needed prior to proceeding.  Should her neurologist feels she is too high risk for general anesthesia and/or surgery then we will need to  regroup and determine a plan for observation 4. Surgical discussion ? We agreed on a plan for open BSO possible hysterectomy and staging ? We discussed if malignancy is suspected at the time of surgery that complete hysterectomy, BSO, and staging would be performed.  ? We discussed if a borderline tumor is found I would recommend  ? Staging hysterectomy as though malignancy have been found given the possibility of changing diagnosis to invasive malignancy and the final report  ? Surgical sketch was reviewed including the risk, benefits, alternatives of the procedure ? She was accompanied today by no one and her questions were answered to satisfaction. ? The patient was given a copy of the surgical sketch the end of the visit. 5. Return to clinic 10 to 14 days postop to review pathology and evaluate incisions      Isabel Caprice, MD  03/14/2018, 11:33 AM  Cc: Tiana Loft, MD (Referring OB/GYN) [name], MD (PCP) Alonza Bogus, DO (Neurology)

## 2018-03-14 NOTE — Progress Notes (Signed)
Consult Note: Gyn-Onc  Consult was requested by Dr. Tiana Loft  CC:  Chief Complaint  Patient presents with  . Pelvic mass    HPI: [name]  is a very nice 62 y.o. P7  Beginning October 2018 the patient noted some left lower back pain which worsened with standing.  Specifically it has been affecting her ADLs by interfering with her cooking meals.  At first she thought this was related to problems with her gait or her bony structure and she followed up with both physical therapy and orthopedics who felt that further work-up should be undertaken.  Recommendation was for imaging and a CT scan was ordered by her orthopedic surgeon December 2018.  She was informed that there was a GYN process for which she should follow-up with gynecology however she postponed this as she felt that this would not be the explanation for the pain.  Ultimately she did follow-up with Dr. Tiana Loft.  Initial thought process was this may be a fibroid uterus and ultrasound was performed.  This revealed normal uterus but a large cystic structure possibly both one on the right measuring 8 cm and another on the left measuring 8 cm uncertain origin possibly bowel in nature and therefore an MRI was requested.  MRI revealed normal uterus with normal endometrial lining however a large multiloculated cystic mass was noted with numerous internal enhancing septations.  A small unilocular cyst was noted in the left ovary measuring 2.7 cm but the main mass up to 14 x 9 x 17 cm.  No free fluid or ascites.  There are internal areas of enhancing septations which are mildly thickened in the larger mass.  A Ca1 25 is normal at 13.1 on 02/23/2018.  In addition a CEA is noted to be normal at 1.2.  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  She states is a progressive  neurologic disease and most people end up in a wheelchair possibly succumbed to their disease less than 10 years from the time of diagnosis.  She does currently have limited ambulation ability and presents today with a walker.  She is mostly concerned about her quality of life specifically the discomfort with standing for long periods while she is cooking.  Of less concern is the potential for malignancy as she does worry that treatment of such a process would decrease the quality of life of the possibly limited time she has left.  She denies nausea and vomiting she has had no significant changes in her body weight.  She has noted decreased appetite and some early satiety.  Her normal bowel movements are every 4 to 5 days and that has not changed.  She has noted some bladder spasms over the past year.  Pain is noted to be lower back upper left abdomen into her thighs 3 out of 10 which lasts in varying intervals Tylenol and rest relieves the pain feels like an ache standing aggravates the pain.  She has some concerns about general anesthesia and asked me if surgery can be done without it.  She is unsure what the side effects of her sulfa allergy ER.  She received this as a teenager.  Her neurologist for her degenerative disorder is Wells Guiles Tat, DO  Oncologic History: Pending further work-up   No history exists.    Measurement of disease:  No results for input(s): CA125, CAN125, CEA, CA199, ESTRADIOL, INHBB in the last 8760 hours.  Invalid input(s): INHIBINA  Pending further work-up . Preoperative Ca1 25 equal 13.3, CEA 1.2  Radiology: . 10/02/2017-CT pelvis-[facility]-left ovary unremarkable right ovary not visualized multiple complex low-density structures arising from the uterus which may represent fibroids the largest measuring 9 cm. . 02/23/2018-transvaginal transabdominal ultrasound [facility]-large simple multicystic areas bilaterally within the adnexa felt to be ovarian in nature left 8  x 5 x 6.8, left 7.8 x 5.9 x 5.2.  Vascularity is noted in the septations between the cyst?  On transabdominal imaging there is a midline simple cyst 8.5 x 7 x 9.4. Marland Kitchen 02/28/2018-MRI pelvis-[facility]-uterus 5.6 x 4.8 x 5.2 with an unremarkable endometrium.  Large multiloculated cystic mass 14.9 x 9.9 x 17.1.  On previous exam which is the CT scan from December 2018 this is increased in size.  There are numerous internal areas of enhancing septation some of the septations are thin others are irregular and thickened up to 3 mm.  In the dominant locule there are multiple tiny mural nodules up to 3 mm.  There is a small unilocular cyst in the left ovary 2.7 cm.  No ascites or peritoneal nodularity identified.  Current Meds:  Outpatient Encounter Medications as of 03/13/2018  Medication Sig  . acetaminophen (TYLENOL) 500 MG tablet Take 1,000 mg by mouth every 4 (four) hours as needed for moderate pain.   . carbidopa-levodopa (SINEMET CR) 50-200 MG tablet Take 1 tablet by mouth at bedtime.  . carbidopa-levodopa (SINEMET IR) 25-100 MG tablet TAKE 1 TABLET BY MOUTH 6 TIMES A DAY and 1-2 PRN daily  . clonazePAM (KLONOPIN) 0.5 MG tablet Take 1 tablet (0.5 mg total) by mouth at bedtime.  . mirabegron ER (MYRBETRIQ) 50 MG TB24 tablet Take 50 mg by mouth daily.  Marland Kitchen omeprazole (PRILOSEC) 40 MG capsule Take 1 capsule (40 mg total) by mouth daily. (Patient taking differently: Take 80 mg by mouth daily. )  . oxybutynin (DITROPAN) 5 MG tablet Take 5 mg by mouth daily.  Marland Kitchen telmisartan (MICARDIS) 80 MG tablet Take 1 tablet (80 mg total) by mouth daily.  . [DISCONTINUED] atenolol (TENORMIN) 100 MG tablet Take 1 tablet (100 mg total) by mouth daily.  . [DISCONTINUED] cloNIDine (CATAPRES) 0.2 MG tablet TAKE 1 TABLET BY MOUTH TWICE DAILY  . [DISCONTINUED] hydrALAZINE (APRESOLINE) 25 MG tablet Take 25 mg by mouth 4 (four) times daily.   No facility-administered encounter medications on file as of 03/13/2018.     Allergy:  Allergies   Allergen Reactions  . Sulfa Antibiotics     Long time ago and does not remember reaction     Social Hx:  Tobacco use: 18-pack-year quit in 1989 Alcohol use: None Illicit Drug use: None Illicit IV Drug use: None    Past Surgical Hx:  Past Surgical History:  Procedure Laterality Date  . DILATION AND CURETTAGE, DIAGNOSTIC / THERAPEUTIC     1981 and 1998 due to missed AB  . ESOPHAGOGASTRODUODENOSCOPY ENDOSCOPY     with esophageal dilation  . FOOT SURGERY  2011   3rd metatarsal LT foot  . KNEE ARTHROSCOPY Left 02/08/2015   Procedure: LEFT KNEE ARTHROSCOPY ;  Surgeon: Latanya Maudlin, MD;  Location: [facility];  Service: Orthopedics;  Laterality: Left;  . TONSILLECTOMY AND ADENOIDECTOMY    . TOTAL KNEE ARTHROPLASTY Left 08/22/2015   Procedure: LEFT TOTAL  KNEE  ARTHROPLASTY;  Surgeon: Latanya Maudlin, MD;  Location: [facility];  Service: Orthopedics;  Laterality: Left;    Past Medical Hx:  Past Medical History:  Diagnosis Date  . Anemia   . Anxiety   . Arthritis    Left knee  . Edema extremities    lower extremity edema left greater than right.  Marland Kitchen GERD (gastroesophageal reflux disease)   . Heart murmur   . History of hiatal hernia    small  . Hx of seasonal allergies    occ. use OTC Mucinex or Antihistamine.  . Hyperlipidemia   . Hypertension   . Mobility impaired    ambulates with walker, tendency to lose balance.  . Neuromuscular disorder ([place])    "multi system atrophy disease"- "pain, bilateral foot numbness, left knee pain"- Dr. Carles Collet is following.  . Shoulder disorder    right shoulder "rigidity due to Multi system atrophy"- ROM not limited"just tires me"    Past Gynecological History:   GYNECOLOGIC HISTORY:  No LMP recorded. Patient is postmenopausal. Menarche: 62 years old P 7; +5 miscarriages LMP 2008 Contraceptive oral contraceptives less than 10 years also used a diaphragm in the past HRT none  Last Pap 01/2018 negative for malignancy satisfactory for evaluation  no HPV performed  Family Hx:  Family History  Problem Relation Age of Onset  . Diabetes Mother   . Kidney disease Mother   . Melanoma Mother        arm  . COPD Father   . Brain cancer Brother   . Melanoma Daughter     Review of Systems:  Review of Systems  Eyes: Positive for eye problems.  Gastrointestinal: Positive for abdominal distention, abdominal pain and constipation.  Genitourinary: Positive for bladder incontinence.   Musculoskeletal: Positive for back pain and gait problem.  Neurological: Positive for extremity weakness, gait problem, headaches and numbness.  All other systems reviewed and are negative.    Vitals:  Blood pressure 132/75, pulse 60, temperature 98.1 F (36.7 C), temperature source Oral, resp. rate 18, height 5\' 8"  (1.727 m), weight 243 lb (110.2 kg), SpO2 96 %. Body mass index is 36.95 kg/m.   Physical Exam: ECOG PERFORMANCE STATUS: 2 - Symptomatic, <50% confined to bed   General :  Well developed, 62 y.o., female in no apparent distress HEENT:  Normocephalic/atraumatic, symmetric, EOMI, eyelids normal Neck:   Supple, no masses.  Lymphatics:  No cervical/ submandibular/ supraclavicular/ infraclavicular/ inguinal adenopathy Respiratory:  Respirations unlabored, no use of accessory muscles CV:   Deferred Breast:  Deferred Musculoskeletal: No CVA tenderness, normal muscle strength. Abdomen:  Overweight.  Soft, non-tender and nondistended. No evidence of hernia. No masses but limited exam due to the habitus. Extremities:  No lymphedema, no erythema, non-tender. Skin:   Normal inspection Neuro/Psych:  No focal motor deficit, no abnormal mental status. Normal gait. Normal affect. Alert and oriented to person, place, and time  Genito Urinary: Vulva: Normal external female genitalia.  Bladder/urethra: Urethral meatus normal in size and location. No lesions or   masses, well supported bladder Speculum exam: Vagina: No lesion, no discharge, no  bleeding. Cervix: Normal appearing with visualization of transformation zone, no lesions. Bimanual exam:  Uterus: Habitus limits examination of size, mobile.  Adnexa: No masses. Rectovaginal:  Good tone, no masses, no cul de sac nodularity, no parametrial involvement or nodularity.  Oncologic Summary: 1. Large pelvic mass 2. Neuro degenerative disorder   Assessment/Plan: 1. Pelvic mass ? Imaging of the pelvis reveals a large pelvic mass ? Imaging of the abdomen has not been been  performed as of yet ? Tumor markers are reassuring including Ca1 25 and CEA ? We reviewed factors supporting observation which is her desire for lack of aggressive management in the face of limited life expectancy.  However I did discuss the growth of the lesion between the 2 scans and the fact this may continue to increase in size and worsen her symptoms. ? Factors reviewed supporting surgical management her symptoms which are affecting her activities of daily living including standing and cooking.  In addition I cannot say with certainty that this is not a malignant lesion.  I do not suspect a highly malignant process borderline process is a possibility and these etiologies were all discussed with the patient.  It is difficult to determine with 384% certainty the natural course of the process without knowing the histology. 2. Management ? Of the options of observation versus surgery she prefers surgical management and I think that is reasonable ? Given the large size of the lesion I have recommended an open approach 3. Preoperative items will include  ? I have a low suspicion for malignancy I do not feel that chest x-ray will contribute to her management at this time and will defer to anesthesia ? Negative Pap smear is on record from April 2019 ? Neurologic clearance will be needed prior to proceeding.  Should her neurologist feels she is too high risk for general anesthesia and/or surgery then we will need to  regroup and determine a plan for observation 4. Surgical discussion ? We agreed on a plan for open BSO possible hysterectomy and staging ? We discussed if malignancy is suspected at the time of surgery that complete hysterectomy, BSO, and staging would be performed.  ? We discussed if a borderline tumor is found I would recommend  ? Staging hysterectomy as though malignancy have been found given the possibility of changing diagnosis to invasive malignancy and the final report  ? Surgical sketch was reviewed including the risk, benefits, alternatives of the procedure ? She was accompanied today by no one and her questions were answered to satisfaction. ? The patient was given a copy of the surgical sketch the end of the visit. 5. Return to clinic 10 to 14 days postop to review pathology and evaluate incisions      Isabel Caprice, MD  03/14/2018, 11:33 AM  Cc: Tiana Loft, MD (Referring OB/GYN) [name], MD (PCP) Alonza Bogus, DO (Neurology)

## 2018-03-16 ENCOUNTER — Telehealth: Payer: Self-pay

## 2018-03-16 ENCOUNTER — Ambulatory Visit: Payer: Medicare Other | Admitting: Family Medicine

## 2018-03-16 NOTE — Telephone Encounter (Signed)
Told [name] that Dr. Gerarda Fraction received the surgical clearance form Neurology. Her surgery is scheduled for 04-09-18. Pre Surgical testing will be intouch with her 10 days to 2 weeks prior to her surgery to set up pre-op appointment.

## 2018-03-16 NOTE — Telephone Encounter (Signed)
That is correct.  May take a little longer to recover from [place] with this diagnosis.  I have reviewed her onc notes

## 2018-03-16 NOTE — Telephone Encounter (Signed)
FYI

## 2018-03-17 ENCOUNTER — Telehealth: Payer: Self-pay | Admitting: Gynecologic Oncology

## 2018-03-17 ENCOUNTER — Telehealth: Payer: Self-pay | Admitting: *Deleted

## 2018-03-17 NOTE — Telephone Encounter (Signed)
Returned call to patient.  She states she does not think she will be going to the conference on June 6 and would like to move her surgery up to that date.  Advised her surgery would be moved and she would get a call from pre-surgical testing to arrange her pre-op.  No concerns voiced.  Advised to call for any needs.

## 2018-03-17 NOTE — Telephone Encounter (Signed)
Called and gave the patient the post op appt for June 14t

## 2018-03-24 NOTE — Patient Instructions (Signed)
Chee Schoeller  03/24/2018   Your procedure is scheduled on: 04-02-18   Report to [facility] Main  Entrance    Report to Admitting at 7:45 AM    Call this number if you have problems the morning of surgery [phone]   Remember: Eat a light diet the day before surgery.  Examples including soups, broths, toast, yogurt, mashed potatoes.  Things to avoid include carbonated beverages (fizzy beverages), raw fruits and raw vegetables, or beans.   If your bowels are filled with gas, your surgeon will have difficulty visualizing your pelvic organs which increases your surgical risks.  NO SOLID FOOD AFTER MIDNIGHT THE NIGHT PRIOR TO SURGERY. NOTHING BY MOUTH EXCEPT CLEAR LIQUIDS UNTIL 3 HOURS PRIOR TO [place] SURGERY. PLEASE FINISH ENSURE DRINK PER SURGEON ORDER 3 HOURS PRIOR TO SCHEDULED SURGERY TIME WHICH NEEDS TO BE COMPLETED AT 6:45 AM.   CLEAR LIQUID DIET   Foods Allowed                                                                     Foods Excluded  Coffee and tea, regular and decaf                             liquids that you cannot  Plain Jell-O in any flavor                                             see through such as: Fruit ices (not with fruit pulp)                                     milk, soups, orange juice  Iced Popsicles                                    All solid food Carbonated beverages, regular and diet                                    Cranberry, grape and apple juices Sports drinks like Gatorade Lightly seasoned clear broth or consume(fat free) Sugar, honey syrup  Sample Menu Breakfast                                Lunch                                     Supper Cranberry juice                    Beef broth  Chicken broth Jell-O                                     Grape juice                           Apple juice Coffee or tea                        Jell-O                                      Popsicle                                          Coffee or tea                        Coffee or tea  _____________________________________________________________________    Take these medicines the morning of surgery with A SIP OF WATER: Atenolol (Tenolol), Carbidopa-Levodopa (Sinemet CR), and Omeprazole (Prilosec)                                You may not have any metal on your body including hair pins and              piercings  Do not wear jewelry, make-up, lotions, powders or perfumes, deodorant             Do not wear nail polish.  Do not shave  48 hours prior to surgery.               Do not bring valuables to the hospital. [place].  Contacts, dentures or bridgework may not be worn into surgery.  Leave suitcase in the car. After surgery it may be brought to your room.    Special Instructions: N/A              Please read over the following fact sheets you were given: _____________________________________________________________________        [facility] - Preparing for Surgery Before surgery, you can play an important role.  Because skin is not sterile, your skin needs to be as free of germs as possible.  You can reduce the number of germs on your skin by washing with CHG (chlorahexidine gluconate) soap before surgery.  CHG is an antiseptic cleaner which kills germs and bonds with the skin to continue killing germs even after washing. Please DO NOT use if you have an allergy to CHG or antibacterial soaps.  If your skin becomes reddened/irritated stop using the CHG and inform your nurse when you arrive at Short Stay. Do not shave (including legs and underarms) for at least 48 hours prior to the first CHG shower.  You may shave your face/neck. Please follow these instructions carefully:  1.  Shower with CHG Soap the night before surgery and the  morning of Surgery.  2.  If you choose to wash your hair, wash your hair first as usual with  your  normal  shampoo.  3.  After you shampoo, rinse your hair and body thoroughly to remove the  shampoo.                           4.  Use CHG as you would any other liquid soap.  You can apply chg directly  to the skin and wash                       Gently with a scrungie or clean washcloth.  5.  Apply the CHG Soap to your body ONLY FROM THE NECK DOWN.   Do not use on face/ open                           Wound or open sores. Avoid contact with eyes, ears mouth and genitals (private parts).                       Wash face,  Genitals (private parts) with your normal soap.             6.  Wash thoroughly, paying special attention to the area where your surgery  will be performed.  7.  Thoroughly rinse your body with warm water from the neck down.  8.  DO NOT shower/wash with your normal soap after using and rinsing off  the CHG Soap.                9.  Pat yourself dry with a clean towel.            10.  Wear clean pajamas.            11.  Place clean sheets on your bed the night of your first shower and do not  sleep with pets. Day of Surgery : Do not apply any lotions/deodorants the morning of surgery.  Please wear clean clothes to the hospital/surgery center.  FAILURE TO FOLLOW THESE INSTRUCTIONS MAY RESULT IN THE CANCELLATION OF YOUR SURGERY PATIENT SIGNATURE_________________________________  NURSE SIGNATURE__________________________________  ________________________________________________________________________   Adam Phenix  An incentive spirometer is a tool that can help keep your lungs clear and active. This tool measures how well you are filling your lungs with each breath. Taking long deep breaths may help reverse or decrease the chance of developing breathing (pulmonary) problems (especially infection) following:  A long period of time when you are unable to move or be active. BEFORE THE PROCEDURE   If the spirometer includes an indicator to show your best effort,  your nurse or respiratory therapist will set it to a desired goal.  If possible, sit up straight or lean slightly forward. Try not to slouch.  Hold the incentive spirometer in an upright position. INSTRUCTIONS FOR USE  1. Sit on the edge of your bed if possible, or sit up as far as you can in bed or on a chair. 2. Hold the incentive spirometer in an upright position. 3. Breathe out normally. 4. Place the mouthpiece in your mouth and seal your lips tightly around it. 5. Breathe in slowly and as deeply as possible, raising the piston or the ball toward the top of the column. 6. Hold your breath for 3-5 seconds or for as long as possible. Allow the piston or ball to fall to the bottom of the column. 7. Remove the mouthpiece from your mouth  and breathe out normally. 8. Rest for a few seconds and repeat Steps 1 through 7 at least 10 times every 1-2 hours when you are awake. Take your time and take a few normal breaths between deep breaths. 9. The spirometer may include an indicator to show your best effort. Use the indicator as a goal to work toward during each repetition. 10. After each set of 10 deep breaths, practice coughing to be sure your lungs are clear. If you have an incision (the cut made at the time of surgery), support your incision when coughing by placing a pillow or rolled up towels firmly against it. Once you are able to get out of bed, walk around indoors and cough well. You may stop using the incentive spirometer when instructed by your caregiver.  RISKS AND COMPLICATIONS  Take your time so you do not get dizzy or light-headed.  If you are in pain, you may need to take or ask for pain medication before doing incentive spirometry. It is harder to take a deep breath if you are having pain. AFTER USE  Rest and breathe slowly and easily.  It can be helpful to keep track of a log of your progress. Your caregiver can provide you with a simple table to help with this. If you are  using the spirometer at home, follow these instructions: [place] IF:   You are having difficultly using the spirometer.  You have trouble using the spirometer as often as instructed.  Your pain medication is not giving enough relief while using the spirometer.  You develop fever of 100.5 F (38.1 C) or higher. SEEK IMMEDIATE MEDICAL CARE IF:   You cough up bloody sputum that had not been present before.  You develop fever of 102 F (38.9 C) or greater.  You develop worsening pain at or near the incision site. MAKE SURE YOU:   Understand these instructions.  Will watch your condition.  Will get help right away if you are not doing well or get worse. Document Released: 02/24/2007 Document Revised: 01/06/2012 Document Reviewed: 04/27/2007 [facility] Patient Information 2014 [place], [place].   ________________________________________________________________________

## 2018-03-25 ENCOUNTER — Telehealth: Payer: Self-pay | Admitting: Family Medicine

## 2018-03-25 ENCOUNTER — Telehealth: Payer: Self-pay | Admitting: Neurology

## 2018-03-25 ENCOUNTER — Inpatient Hospital Stay (HOSPITAL_COMMUNITY)
Admission: RE | Admit: 2018-03-25 | Discharge: 2018-03-25 | Disposition: A | Discharge status: Home / Self Care | Payer: Medicare Other | Source: Ambulatory Visit

## 2018-03-25 NOTE — Telephone Encounter (Signed)
Jacqueline Atkinson, tell pt that klonopin denied by insurance.  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  We appealed and it was denied but pt can still appeal and I would recommend she do that.

## 2018-03-25 NOTE — Patient Instructions (Signed)
Jacqueline Atkinson  03/25/2018   Your procedure is scheduled on: 04-02-18   Report to [facility] Main  Entrance  Report to admitting at     [place] AM    Call this number if you have problems the morning of surgery [phone]             Follow a light diet the day before surgery  Examples are toast yogurt mashed potatoes soups and broths. AVOID raw fruits and veggies and carbonated beverages  Remember: Do not eat food :After Midnight.  NO SOLID FOOD AFTER MIDNIGHT THE NIGHT PRIOR TO SURGERY. NOTHING BY MOUTH EXCEPT CLEAR LIQUIDS UNTIL 3 HOURS PRIOR TO [place] SURGERY. PLEASE FINISH ENSURE DRINK PER SURGEON ORDER 3 HOURS PRIOR TO SCHEDULED SURGERY TIME WHICH NEEDS TO BE COMPLETED AT ___0645 am_________. I       Take these medicines the morning of surgery with A SIP OF WATER: atenolol, carbidopa-levadopa , eye drops clonidine, omeprazole                                You may not have any metal on your body including hair pins and              piercings  Do not wear jewelry, make-up, lotions, powders or perfumes, deodorant             Do not wear nail polish.  Do not shave  48 hours prior to surgery.    Do not bring valuables to the hospital. [place].  Contacts, dentures or bridgework may not be worn into surgery.  Leave suitcase in the car. After surgery it may be brought to your room.               Please read over the following fact sheets you were given: _____________________________________________________________________           [facility] - Preparing for Surgery Before surgery, you can play an important role.  Because skin is not sterile, your skin needs to be as free of germs as possible.  You can reduce the number of germs on your skin by washing with CHG (chlorahexidine gluconate) soap before surgery.  CHG is an antiseptic cleaner which kills germs and bonds with the skin to continue killing germs even  after washing. Please DO NOT use if you have an allergy to CHG or antibacterial soaps.  If your skin becomes reddened/irritated stop using the CHG and inform your nurse when you arrive at Short Stay. Do not shave (including legs and underarms) for at least 48 hours prior to the first CHG shower.  You may shave your face/neck. Please follow these instructions carefully:  1.  Shower with CHG Soap the night before surgery and the  morning of Surgery.  2.  If you choose to wash your hair, wash your hair first as usual with your  normal  shampoo.  3.  After you shampoo, rinse your hair and body thoroughly to remove the  shampoo.                           4.  Use CHG as you would any other liquid soap.  You  can apply chg directly  to the skin and wash                       Gently with a scrungie or clean washcloth.  5.  Apply the CHG Soap to your body ONLY FROM THE NECK DOWN.   Do not use on face/ open                           Wound or open sores. Avoid contact with eyes, ears mouth and genitals (private parts).                       Wash face,  Genitals (private parts) with your normal soap.             6.  Wash thoroughly, paying special attention to the area where your surgery  will be performed.  7.  Thoroughly rinse your body with warm water from the neck down.  8.  DO NOT shower/wash with your normal soap after using and rinsing off  the CHG Soap.                9.  Pat yourself dry with a clean towel.            10.  Wear clean pajamas.            11.  Place clean sheets on your bed the night of your first shower and do not  sleep with pets. Day of Surgery : Do not apply any lotions/deodorants the morning of surgery.  Please wear clean clothes to the hospital/surgery center.  FAILURE TO FOLLOW THESE INSTRUCTIONS MAY RESULT IN THE CANCELLATION OF YOUR SURGERY PATIENT SIGNATURE_________________________________  NURSE  SIGNATURE__________________________________  ________________________________________________________________________  WHAT IS A BLOOD TRANSFUSION? Blood Transfusion Information  A transfusion is the replacement of blood or some of its parts. Blood is made up of multiple cells which provide different functions.  Red blood cells carry oxygen and are used for blood loss replacement.  White blood cells fight against infection.  Platelets control bleeding.  Plasma helps clot blood.  Other blood products are available for specialized needs, such as hemophilia or other clotting disorders. BEFORE THE TRANSFUSION  Who gives blood for transfusions?   Healthy volunteers who are fully evaluated to make sure their blood is safe. This is blood bank blood. Transfusion therapy is the safest it has ever been in the practice of medicine. Before blood is taken from a donor, a complete history is taken to make sure that person has no history of diseases nor engages in risky social behavior (examples are intravenous drug use or sexual activity with multiple partners). The donor's travel history is screened to minimize risk of transmitting infections, such as malaria. The donated blood is tested for signs of infectious diseases, such as HIV and hepatitis. The blood is then tested to be sure it is compatible with you in order to minimize the chance of a transfusion reaction. If you or a relative donates blood, this is often done in anticipation of surgery and is not appropriate for emergency situations. It takes many days to process the donated blood. RISKS AND COMPLICATIONS Although transfusion therapy is very safe and saves many lives, the main dangers of transfusion include:   Getting an infectious disease.  Developing a transfusion reaction. This is an allergic reaction to something in the blood you were given. Every  precaution is taken to prevent this. The decision to have a blood transfusion has been  considered carefully by your caregiver before blood is given. Blood is not given unless the benefits outweigh the risks. AFTER THE TRANSFUSION  Right after receiving a blood transfusion, you will usually feel much better and more energetic. This is especially true if your red blood cells have gotten low (anemic). The transfusion raises the level of the red blood cells which carry oxygen, and this usually causes an energy increase.  The nurse administering the transfusion will monitor you carefully for complications. HOME CARE INSTRUCTIONS  No special instructions are needed after a transfusion. You may find your energy is better. Speak with your caregiver about any limitations on activity for underlying diseases you may have. SEEK MEDICAL CARE IF:   Your condition is not improving after your transfusion.  You develop redness or irritation at the intravenous (IV) site. SEEK IMMEDIATE MEDICAL CARE IF:  Any of the following symptoms occur over the next 12 hours:  Shaking chills.  You have a temperature by mouth above 102 F (38.9 C), not controlled by medicine.  Chest, back, or muscle pain.  People around you feel you are not acting correctly or are confused.  Shortness of breath or difficulty breathing.  Dizziness and fainting.  You get a rash or develop hives.  You have a decrease in urine output.  Your urine turns a dark color or changes to pink, red, or brown. Any of the following symptoms occur over the next 10 days:  You have a temperature by mouth above 102 F (38.9 C), not controlled by medicine.  Shortness of breath.  Weakness after normal activity.  The white part of the eye turns yellow (jaundice).  You have a decrease in the amount of urine or are urinating less often.  Your urine turns a dark color or changes to pink, red, or brown. Document Released: 10/11/2000 Document Revised: 01/06/2012 Document Reviewed: 05/30/2008 ExitCare Patient Information 2014  [place].  _______________________________________________________________________  Incentive Spirometer  An incentive spirometer is a tool that can help keep your lungs clear and active. This tool measures how well you are filling your lungs with each breath. Taking long deep breaths may help reverse or decrease the chance of developing breathing (pulmonary) problems (especially infection) following:  A long period of time when you are unable to move or be active. BEFORE THE PROCEDURE   If the spirometer includes an indicator to show your best effort, your nurse or respiratory therapist will set it to a desired goal.  If possible, sit up straight or lean slightly forward. Try not to slouch.  Hold the incentive spirometer in an upright position. INSTRUCTIONS FOR USE  1. Sit on the edge of your bed if possible, or sit up as far as you can in bed or on a chair. 2. Hold the incentive spirometer in an upright position. 3. Breathe out normally. 4. Place the mouthpiece in your mouth and seal your lips tightly around it. 5. Breathe in slowly and as deeply as possible, raising the piston or the ball toward the top of the column. 6. Hold your breath for 3-5 seconds or for as long as possible. Allow the piston or ball to fall to the bottom of the column. 7. Remove the mouthpiece from your mouth and breathe out normally. 8. Rest for a few seconds and repeat Steps 1 through 7 at least 10 times every 1-2 hours when you are awake.  Take your time and take a few normal breaths between deep breaths. 9. The spirometer may include an indicator to show your best effort. Use the indicator as a goal to work toward during each repetition. 10. After each set of 10 deep breaths, practice coughing to be sure your lungs are clear. If you have an incision (the cut made at the time of surgery), support your incision when coughing by placing a pillow or rolled up towels firmly against it. Once you are able to get  out of bed, walk around indoors and cough well. You may stop using the incentive spirometer when instructed by your caregiver.  RISKS AND COMPLICATIONS  Take your time so you do not get dizzy or light-headed.  If you are in pain, you may need to take or ask for pain medication before doing incentive spirometry. It is harder to take a deep breath if you are having pain. AFTER USE  Rest and breathe slowly and easily.  It can be helpful to keep track of a log of your progress. Your caregiver can provide you with a simple table to help with this. If you are using the spirometer at home, follow these instructions: [place] IF:   You are having difficultly using the spirometer.  You have trouble using the spirometer as often as instructed.  Your pain medication is not giving enough relief while using the spirometer.  You develop fever of 100.5 F (38.1 C) or higher. SEEK IMMEDIATE MEDICAL CARE IF:   You cough up bloody sputum that had not been present before.  You develop fever of 102 F (38.9 C) or greater.  You develop worsening pain at or near the incision site. MAKE SURE YOU:   Understand these instructions.  Will watch your condition.  Will get help right away if you are not doing well or get worse. Document Released: 02/24/2007 Document Revised: 01/06/2012 Document Reviewed: 04/27/2007 [facility] Patient Information 2014 [place], [place].   ________________________________________________________________________

## 2018-03-25 NOTE — Telephone Encounter (Signed)
Patient made aware.

## 2018-03-25 NOTE — Telephone Encounter (Signed)
Copied from [place] [id number]. Topic: Quick Communication - See Telephone Encounter >> Mar 25, 2018 11:55 AM Cleaster Corin, NT wrote: CRM for notification. See Telephone encounter for: 03/25/18.  cloNIDine (CATAPRES) 0.2 MG tablet [[id number]] med. Is Working but making pt. Very tired all 4 of the b/p med. That pt. Is currently taking is making pt. Very constipated. On   04/02/18 pt. Stated that she is having  abdominal surgery pt. Would like to go back to taking med. The way she has been taking med. Before and will talk with provider after surgery to see what can be done next Can be reached at [phone]

## 2018-03-26 ENCOUNTER — Other Ambulatory Visit: Payer: Self-pay

## 2018-03-26 ENCOUNTER — Encounter (HOSPITAL_COMMUNITY): Payer: Self-pay

## 2018-03-26 ENCOUNTER — Encounter (HOSPITAL_COMMUNITY)
Admission: RE | Admit: 2018-03-26 | Discharge: 2018-03-26 | Disposition: A | Discharge status: Home / Self Care | Payer: Medicare Other | Source: Ambulatory Visit | Attending: Obstetrics | Admitting: Obstetrics

## 2018-03-26 DIAGNOSIS — R001 Bradycardia, unspecified: Secondary | ICD-10-CM | POA: Diagnosis not present

## 2018-03-26 DIAGNOSIS — Z01812 Encounter for preprocedural laboratory examination: Secondary | ICD-10-CM | POA: Diagnosis present

## 2018-03-26 DIAGNOSIS — I4510 Unspecified right bundle-branch block: Secondary | ICD-10-CM | POA: Insufficient documentation

## 2018-03-26 DIAGNOSIS — Z01810 Encounter for preprocedural cardiovascular examination: Secondary | ICD-10-CM | POA: Diagnosis present

## 2018-03-26 DIAGNOSIS — F419 Anxiety disorder, unspecified: Secondary | ICD-10-CM | POA: Insufficient documentation

## 2018-03-26 DIAGNOSIS — I10 Essential (primary) hypertension: Secondary | ICD-10-CM | POA: Diagnosis not present

## 2018-03-26 DIAGNOSIS — E785 Hyperlipidemia, unspecified: Secondary | ICD-10-CM | POA: Insufficient documentation

## 2018-03-26 DIAGNOSIS — K219 Gastro-esophageal reflux disease without esophagitis: Secondary | ICD-10-CM | POA: Insufficient documentation

## 2018-03-26 DIAGNOSIS — M7989 Other specified soft tissue disorders: Secondary | ICD-10-CM | POA: Diagnosis not present

## 2018-03-26 DIAGNOSIS — R1900 Intra-abdominal and pelvic swelling, mass and lump, unspecified site: Secondary | ICD-10-CM | POA: Diagnosis not present

## 2018-03-26 DIAGNOSIS — D641 Secondary sideroblastic anemia due to disease: Secondary | ICD-10-CM | POA: Insufficient documentation

## 2018-03-26 HISTORY — DX: Pneumonia, unspecified organism: J18.9

## 2018-03-26 LAB — CBC
HEMATOCRIT: 40.3 % (ref 36.0–46.0)
HEMOGLOBIN: 13.3 g/dL (ref 12.0–15.0)
MCH: 29.3 pg (ref 26.0–34.0)
MCHC: 33 g/dL (ref 30.0–36.0)
MCV: 88.8 fL (ref 78.0–100.0)
Platelets: 175 10*3/uL (ref 150–400)
RBC: 4.54 MIL/uL (ref 3.87–5.11)
RDW: 13.9 % (ref 11.5–15.5)
WBC: 5.3 10*3/uL (ref 4.0–10.5)

## 2018-03-26 LAB — COMPREHENSIVE METABOLIC PANEL
ALBUMIN: 4.6 g/dL (ref 3.5–5.0)
ALK PHOS: 112 U/L (ref 38–126)
ALT: 8 U/L — ABNORMAL LOW (ref 14–54)
ANION GAP: 9 (ref 5–15)
AST: 24 U/L (ref 15–41)
BILIRUBIN TOTAL: 0.5 mg/dL (ref 0.3–1.2)
BUN: 11 mg/dL (ref 6–20)
CALCIUM: 9.1 mg/dL (ref 8.9–10.3)
CO2: 24 mmol/L (ref 22–32)
Chloride: 107 mmol/L (ref 101–111)
Creatinine, Ser: 0.78 mg/dL (ref 0.44–1.00)
GFR calc Af Amer: >60 mL/min (ref >60)
GFR calc non Af Amer: >60 mL/min (ref >60)
GLUCOSE: 98 mg/dL (ref 65–99)
Potassium: 4.1 mmol/L (ref 3.5–5.1)
Sodium: 140 mmol/L (ref 135–145)
TOTAL PROTEIN: 7.2 g/dL (ref 6.5–8.1)

## 2018-03-26 LAB — URINALYSIS, ROUTINE W REFLEX MICROSCOPIC
BILIRUBIN URINE: NEGATIVE
Glucose, UA: NEGATIVE mg/dL
KETONES UR: NEGATIVE mg/dL
Nitrite: POSITIVE — AB
PROTEIN: NEGATIVE mg/dL
Specific Gravity, Urine: 1.012 (ref 1.005–1.030)
pH: 5 (ref 5.0–8.0)

## 2018-03-27 ENCOUNTER — Ambulatory Visit: Payer: Medicare Other | Admitting: Physical Therapy

## 2018-03-27 LAB — URINE CULTURE

## 2018-03-27 NOTE — Progress Notes (Signed)
Pt. Aware of time change arrive at 1100 am to admitting presurgery drink finished by 1000am . Pt. Verbalized understanding

## 2018-03-27 NOTE — Telephone Encounter (Signed)
Phone call to patient to clarify message from call center.   Jacqueline Atkinson states the clonidine is working, but suspects it is it making her constipated and tired. Stopped taking 0.4mg  x1 week ago, went back to taking 0.2mg   in am and in pm. Has not had any severe headaches. States she was falling asleep in the middle of the day, is less sleepy now.  Regarding constipation: Last BM was Sunday, having BM's x1 a week. Patient states she is taking magnesium citrate, drinking a 10 oz bottle once a week for the last two weeks. 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 Reports she has soft stool. Tries to eat a lot of fiber. Trying to stay active. She states, "Every one of my BP meds has constipation as side effect. I'm on 6 different meds with constipation as a side effect."  She states she has increased her hydralazine also increased. Taking this 4 times a day currently.  Patient would like advice on how she should take her clonidine and what she should do regarding constipation.   Dr. Brigitte Pulse, please advise.

## 2018-03-30 NOTE — Progress Notes (Signed)
UA and culture routed to Dr. Gerarda Fraction via epic

## 2018-03-31 MED ORDER — SENNOSIDES-DOCUSATE SODIUM 8.6-50 MG PO TABS: 2.0000 | ORAL_TABLET | Freq: Every day | ORAL | Status: DC

## 2018-03-31 NOTE — Telephone Encounter (Signed)
Patient advised. Has not had bowel movement in 9 days. Advised that if she does not go by tomorrow she may need follow-up in ED.

## 2018-03-31 NOTE — Telephone Encounter (Signed)
I'm sorry she reacted so poorly to the clonidine increase but glad she is doing better now. Unfortunately no easier answers with the BP - maybe try changing hydralazine to 100mg  three times a day to increase effect and compliance?  For her bowels, recommend she start senokot s 2 tabs every night before bed - can gradually increase up to 4 pills qhs if she needs- would be best to find a regimen she can just take daily since the instigating causes are not going to go away.  This will provide the natural "laxative" effect which it sounds like she is in need of as well as the softener. And LOTS of "smooth move" tea.

## 2018-04-02 ENCOUNTER — Inpatient Hospital Stay (HOSPITAL_COMMUNITY)
Admission: RE | Admit: 2018-04-02 | Discharge: 2018-04-05 | DRG: 743 | Disposition: A | Discharge status: Home Health | Payer: Medicare Other | Source: Ambulatory Visit | Attending: Obstetrics | Admitting: Obstetrics

## 2018-04-02 ENCOUNTER — Encounter (HOSPITAL_COMMUNITY): Payer: Self-pay

## 2018-04-02 ENCOUNTER — Encounter (HOSPITAL_COMMUNITY)
Admission: RE | Disposition: A | Discharge status: Home Health | Payer: Self-pay | Source: Ambulatory Visit | Attending: Obstetrics

## 2018-04-02 ENCOUNTER — Inpatient Hospital Stay (HOSPITAL_COMMUNITY): Payer: Medicare Other | Admitting: Anesthesiology

## 2018-04-02 ENCOUNTER — Encounter: Payer: Medicare Other | Admitting: Occupational Therapy

## 2018-04-02 ENCOUNTER — Ambulatory Visit: Payer: Medicare Other | Admitting: Physical Therapy

## 2018-04-02 ENCOUNTER — Other Ambulatory Visit: Payer: Self-pay

## 2018-04-02 DIAGNOSIS — R1900 Intra-abdominal and pelvic swelling, mass and lump, unspecified site: Secondary | ICD-10-CM | POA: Diagnosis present

## 2018-04-02 DIAGNOSIS — Z79899 Other long term (current) drug therapy: Secondary | ICD-10-CM | POA: Diagnosis not present

## 2018-04-02 DIAGNOSIS — K219 Gastro-esophageal reflux disease without esophagitis: Secondary | ICD-10-CM | POA: Diagnosis present

## 2018-04-02 DIAGNOSIS — Z96652 Presence of left artificial knee joint: Secondary | ICD-10-CM | POA: Diagnosis not present

## 2018-04-02 DIAGNOSIS — D271 Benign neoplasm of left ovary: Secondary | ICD-10-CM | POA: Diagnosis not present

## 2018-04-02 DIAGNOSIS — E785 Hyperlipidemia, unspecified: Secondary | ICD-10-CM | POA: Diagnosis not present

## 2018-04-02 DIAGNOSIS — Z6836 Body mass index (BMI) 36.0-36.9, adult: Secondary | ICD-10-CM

## 2018-04-02 DIAGNOSIS — F419 Anxiety disorder, unspecified: Secondary | ICD-10-CM | POA: Diagnosis present

## 2018-04-02 DIAGNOSIS — Z882 Allergy status to sulfonamides status: Secondary | ICD-10-CM

## 2018-04-02 DIAGNOSIS — Z87891 Personal history of nicotine dependence: Secondary | ICD-10-CM

## 2018-04-02 DIAGNOSIS — E669 Obesity, unspecified: Secondary | ICD-10-CM | POA: Diagnosis not present

## 2018-04-02 DIAGNOSIS — D270 Benign neoplasm of right ovary: Secondary | ICD-10-CM | POA: Diagnosis present

## 2018-04-02 DIAGNOSIS — I10 Essential (primary) hypertension: Secondary | ICD-10-CM | POA: Diagnosis present

## 2018-04-02 DIAGNOSIS — R8569 Abnormal cytological findings in specimens from other digestive organs and abdominal cavity: Secondary | ICD-10-CM | POA: Diagnosis not present

## 2018-04-02 HISTORY — PX: LAPAROTOMY: SHX154

## 2018-04-02 HISTORY — PX: SALPINGOOPHORECTOMY: SHX82

## 2018-04-02 LAB — URINALYSIS, ROUTINE W REFLEX MICROSCOPIC
Bilirubin Urine: NEGATIVE
GLUCOSE, UA: NEGATIVE mg/dL
HGB URINE DIPSTICK: NEGATIVE
Ketones, ur: NEGATIVE mg/dL
NITRITE: NEGATIVE
Protein, ur: NEGATIVE mg/dL
SPECIFIC GRAVITY, URINE: 1.017 (ref 1.005–1.030)
pH: 5 (ref 5.0–8.0)

## 2018-04-02 LAB — TYPE AND SCREEN
ABO/RH(D): O POS
Antibody Screen: NEGATIVE

## 2018-04-02 SURGERY — LAPAROTOMY, EXPLORATORY
Anesthesia: General | Site: Abdomen

## 2018-04-02 MED ORDER — SUFENTANIL CITRATE 50 MCG/ML IV SOLN
INTRAVENOUS | Status: DC | PRN
  Administered 2018-04-02 (×2): 10 ug via INTRAVENOUS
  Administered 2018-04-02: 20 ug via INTRAVENOUS
  Administered 2018-04-02: 10 ug via INTRAVENOUS

## 2018-04-02 MED ORDER — SENNOSIDES-DOCUSATE SODIUM 8.6-50 MG PO TABS
2.0000 | ORAL_TABLET | Freq: Every day | ORAL | Status: DC
  Administered 2018-04-02 – 2018-04-04 (×3): 2 via ORAL
  Filled 2018-04-02 (×3): qty 2

## 2018-04-02 MED ORDER — KETOROLAC TROMETHAMINE 15 MG/ML IJ SOLN
INTRAMUSCULAR | Status: AC
  Administered 2018-04-02: 15 mg via INTRAVENOUS
  Filled 2018-04-02: qty 1

## 2018-04-02 MED ORDER — ACETAMINOPHEN 500 MG PO TABS: 1000.0000 mg | ORAL_TABLET | Freq: Four times a day (QID) | ORAL | Status: DC

## 2018-04-02 MED ORDER — STERILE WATER FOR IRRIGATION IR SOLN
Status: DC | PRN
  Administered 2018-04-02: 2000 mL

## 2018-04-02 MED ORDER — HYDRALAZINE HCL 20 MG/ML IJ SOLN
10.0000 mg | INTRAMUSCULAR | Status: DC | PRN
  Administered 2018-04-02: 10 mg via INTRAVENOUS

## 2018-04-02 MED ORDER — FENTANYL CITRATE (PF) 100 MCG/2ML IJ SOLN
INTRAMUSCULAR | Status: AC
  Administered 2018-04-02: 50 ug via INTRAVENOUS
  Filled 2018-04-02: qty 2

## 2018-04-02 MED ORDER — SODIUM CHLORIDE 0.9 % IJ SOLN
INTRAMUSCULAR | Status: AC
  Filled 2018-04-02: qty 10

## 2018-04-02 MED ORDER — KETAMINE HCL 10 MG/ML IJ SOLN
INTRAMUSCULAR | Status: DC | PRN
  Administered 2018-04-02: 20 mg via INTRAVENOUS
  Administered 2018-04-02: 50 mg via INTRAVENOUS

## 2018-04-02 MED ORDER — ONDANSETRON HCL 4 MG PO TABS: 4.0000 mg | ORAL_TABLET | Freq: Four times a day (QID) | ORAL | Status: DC | PRN

## 2018-04-02 MED ORDER — ENOXAPARIN SODIUM 40 MG/0.4ML ~~LOC~~ SOLN
40.0000 mg | SUBCUTANEOUS | Status: AC
  Administered 2018-04-02: 40 mg via SUBCUTANEOUS
  Filled 2018-04-02 (×2): qty 0.4

## 2018-04-02 MED ORDER — CLONAZEPAM 0.5 MG PO TABS
0.5000 mg | ORAL_TABLET | Freq: Every day | ORAL | Status: DC
  Administered 2018-04-02 – 2018-04-04 (×3): 0.5 mg via ORAL
  Filled 2018-04-02 (×3): qty 1

## 2018-04-02 MED ORDER — LACTATED RINGERS IV SOLN
INTRAVENOUS | Status: DC
  Administered 2018-04-02 (×3): via INTRAVENOUS

## 2018-04-02 MED ORDER — GLYCOPYRROLATE 0.2 MG/ML IJ SOLN
INTRAMUSCULAR | Status: DC | PRN
  Administered 2018-04-02: 0.2 mg via INTRAVENOUS

## 2018-04-02 MED ORDER — OXYCODONE HCL 5 MG/5ML PO SOLN
5.0000 mg | Freq: Once | ORAL | Status: DC | PRN
  Filled 2018-04-02: qty 5

## 2018-04-02 MED ORDER — SODIUM CHLORIDE 0.9 % IV SOLN
2.0000 g | Freq: Once | INTRAVENOUS | Status: DC
  Filled 2018-04-02: qty 2

## 2018-04-02 MED ORDER — GABAPENTIN 300 MG PO CAPS
300.0000 mg | ORAL_CAPSULE | ORAL | Status: AC
  Administered 2018-04-02: 300 mg via ORAL
  Filled 2018-04-02: qty 1

## 2018-04-02 MED ORDER — ACETAMINOPHEN 10 MG/ML IV SOLN
INTRAVENOUS | Status: AC
  Administered 2018-04-02: 1000 mg via INTRAVENOUS
  Filled 2018-04-02: qty 100

## 2018-04-02 MED ORDER — CARBIDOPA-LEVODOPA ER 50-200 MG PO TBCR
1.0000 | EXTENDED_RELEASE_TABLET | Freq: Every day | ORAL | Status: DC
  Administered 2018-04-02 – 2018-04-04 (×3): 1 via ORAL
  Filled 2018-04-02 (×3): qty 1

## 2018-04-02 MED ORDER — SUFENTANIL CITRATE 50 MCG/ML IV SOLN
INTRAVENOUS | Status: AC
  Filled 2018-04-02: qty 1

## 2018-04-02 MED ORDER — LIDOCAINE 2% (20 MG/ML) 5 ML SYRINGE
INTRAMUSCULAR | Status: AC
  Filled 2018-04-02: qty 5

## 2018-04-02 MED ORDER — SODIUM CHLORIDE 0.9 % IV SOLN
INTRAVENOUS | Status: DC
  Administered 2018-04-02: 20:00:00 via INTRAVENOUS

## 2018-04-02 MED ORDER — PROPOFOL 10 MG/ML IV BOLUS
INTRAVENOUS | Status: AC
  Filled 2018-04-02: qty 40

## 2018-04-02 MED ORDER — DEXAMETHASONE SODIUM PHOSPHATE 4 MG/ML IJ SOLN: 4.0000 mg | INTRAMUSCULAR | Status: DC

## 2018-04-02 MED ORDER — SCOPOLAMINE 1 MG/3DAYS TD PT72
1.0000 | MEDICATED_PATCH | TRANSDERMAL | Status: DC
  Administered 2018-04-02: 1.5 mg via TRANSDERMAL
  Filled 2018-04-02: qty 1

## 2018-04-02 MED ORDER — MIDAZOLAM HCL 2 MG/2ML IJ SOLN
INTRAMUSCULAR | Status: DC | PRN
  Administered 2018-04-02: 2 mg via INTRAVENOUS

## 2018-04-02 MED ORDER — LIDOCAINE 2% (20 MG/ML) 5 ML SYRINGE
INTRAMUSCULAR | Status: DC | PRN
  Administered 2018-04-02: 100 mg via INTRAVENOUS

## 2018-04-02 MED ORDER — DEXAMETHASONE SODIUM PHOSPHATE 10 MG/ML IJ SOLN
INTRAMUSCULAR | Status: DC | PRN
  Administered 2018-04-02: 10 mg via INTRAVENOUS

## 2018-04-02 MED ORDER — SUGAMMADEX SODIUM 500 MG/5ML IV SOLN
INTRAVENOUS | Status: AC
  Filled 2018-04-02: qty 5

## 2018-04-02 MED ORDER — KETOROLAC TROMETHAMINE 15 MG/ML IJ SOLN
15.0000 mg | Freq: Four times a day (QID) | INTRAMUSCULAR | Status: DC | PRN
  Administered 2018-04-02 – 2018-04-04 (×5): 15 mg via INTRAVENOUS
  Filled 2018-04-02 (×4): qty 1

## 2018-04-02 MED ORDER — CARBIDOPA-LEVODOPA 25-100 MG PO TABS
1.0000 | ORAL_TABLET | ORAL | Status: DC
  Administered 2018-04-02 – 2018-04-05 (×21): 1 via ORAL
  Filled 2018-04-02 (×17): qty 1

## 2018-04-02 MED ORDER — PHENYLEPHRINE HCL 10 MG/ML IJ SOLN
INTRAVENOUS | Status: DC | PRN
  Administered 2018-04-02: 50 ug/min via INTRAVENOUS

## 2018-04-02 MED ORDER — BUPIVACAINE HCL 0.25 % IJ SOLN
INTRAMUSCULAR | Status: DC | PRN
  Administered 2018-04-02: 20 mL

## 2018-04-02 MED ORDER — ACETAMINOPHEN 500 MG PO TABS
1000.0000 mg | ORAL_TABLET | ORAL | Status: AC
  Administered 2018-04-02: 1000 mg via ORAL
  Filled 2018-04-02: qty 2

## 2018-04-02 MED ORDER — SODIUM CHLORIDE 0.9 % IV SOLN: INTRAVENOUS | Status: DC

## 2018-04-02 MED ORDER — MORPHINE SULFATE (PF) 2 MG/ML IV SOLN: 1.0000 mg | INTRAVENOUS | Status: DC | PRN

## 2018-04-02 MED ORDER — OXYCODONE HCL 5 MG PO TABS: 5.0000 mg | ORAL_TABLET | Freq: Once | ORAL | Status: DC | PRN

## 2018-04-02 MED ORDER — ONDANSETRON HCL 4 MG/2ML IJ SOLN: 4.0000 mg | Freq: Four times a day (QID) | INTRAMUSCULAR | Status: DC | PRN

## 2018-04-02 MED ORDER — MORPHINE SULFATE (PF) 2 MG/ML IV SOLN
1.0000 mg | INTRAVENOUS | Status: DC | PRN
  Administered 2018-04-02 (×2): 1 mg via INTRAVENOUS
  Filled 2018-04-02 (×2): qty 1

## 2018-04-02 MED ORDER — ACETAMINOPHEN 500 MG PO TABS
1000.0000 mg | ORAL_TABLET | Freq: Four times a day (QID) | ORAL | Status: AC
  Administered 2018-04-02 – 2018-04-03 (×3): 1000 mg via ORAL
  Filled 2018-04-02 (×5): qty 2

## 2018-04-02 MED ORDER — FENTANYL CITRATE (PF) 100 MCG/2ML IJ SOLN
25.0000 ug | INTRAMUSCULAR | Status: DC | PRN
  Administered 2018-04-02 (×3): 50 ug via INTRAVENOUS

## 2018-04-02 MED ORDER — PROPOFOL 500 MG/50ML IV EMUL
INTRAVENOUS | Status: DC | PRN
  Administered 2018-04-02: 10 ug/kg/min via INTRAVENOUS

## 2018-04-02 MED ORDER — DEXAMETHASONE SODIUM PHOSPHATE 10 MG/ML IJ SOLN
INTRAMUSCULAR | Status: AC
  Filled 2018-04-02: qty 1

## 2018-04-02 MED ORDER — MIRABEGRON ER 25 MG PO TB24
50.0000 mg | ORAL_TABLET | Freq: Every day | ORAL | Status: DC
  Administered 2018-04-03 – 2018-04-05 (×3): 50 mg via ORAL
  Filled 2018-04-02 (×3): qty 2

## 2018-04-02 MED ORDER — CARBOXYMETHYLCELLUL-GLYCERIN 0.5-0.9 % OP SOLN: 1.0000 [drp] | Freq: Three times a day (TID) | OPHTHALMIC | Status: DC | PRN

## 2018-04-02 MED ORDER — PROPOFOL 10 MG/ML IV BOLUS
INTRAVENOUS | Status: DC | PRN
  Administered 2018-04-02: 150 mg via INTRAVENOUS

## 2018-04-02 MED ORDER — HYDRALAZINE HCL 20 MG/ML IJ SOLN
INTRAMUSCULAR | Status: AC
  Administered 2018-04-02: 10 mg via INTRAVENOUS
  Filled 2018-04-02: qty 1

## 2018-04-02 MED ORDER — HYDROCODONE-ACETAMINOPHEN 5-325 MG PO TABS
1.0000 | ORAL_TABLET | ORAL | Status: DC | PRN
  Administered 2018-04-03 – 2018-04-04 (×2): 2 via ORAL
  Filled 2018-04-02 (×3): qty 2

## 2018-04-02 MED ORDER — SODIUM CHLORIDE 0.9 % IV SOLN
2.0000 g | INTRAVENOUS | Status: AC
  Administered 2018-04-02: 2 g via INTRAVENOUS
  Filled 2018-04-02: qty 2

## 2018-04-02 MED ORDER — PANTOPRAZOLE SODIUM 40 MG PO TBEC
40.0000 mg | DELAYED_RELEASE_TABLET | Freq: Every day | ORAL | Status: DC
  Administered 2018-04-03: 40 mg via ORAL
  Filled 2018-04-02: qty 1

## 2018-04-02 MED ORDER — BUPIVACAINE LIPOSOME 1.3 % IJ SUSP
20.0000 mL | Freq: Once | INTRAMUSCULAR | Status: AC
  Administered 2018-04-02: 20 mL
  Filled 2018-04-02: qty 20

## 2018-04-02 MED ORDER — ONDANSETRON HCL 4 MG/2ML IJ SOLN: 4.0000 mg | Freq: Once | INTRAMUSCULAR | Status: DC | PRN

## 2018-04-02 MED ORDER — ROCURONIUM BROMIDE 10 MG/ML (PF) SYRINGE
PREFILLED_SYRINGE | INTRAVENOUS | Status: DC | PRN
  Administered 2018-04-02: 40 mg via INTRAVENOUS
  Administered 2018-04-02 (×2): 10 mg via INTRAVENOUS

## 2018-04-02 MED ORDER — HYDROCODONE-ACETAMINOPHEN 5-325 MG PO TABS: 1.0000 | ORAL_TABLET | ORAL | Status: DC | PRN

## 2018-04-02 MED ORDER — ONDANSETRON HCL 4 MG/2ML IJ SOLN
INTRAMUSCULAR | Status: DC | PRN
  Administered 2018-04-02: 4 mg via INTRAVENOUS

## 2018-04-02 MED ORDER — ENOXAPARIN SODIUM 40 MG/0.4ML ~~LOC~~ SOLN: 40.0000 mg | SUBCUTANEOUS | Status: DC

## 2018-04-02 MED ORDER — ROCURONIUM BROMIDE 10 MG/ML (PF) SYRINGE
PREFILLED_SYRINGE | INTRAVENOUS | Status: AC
  Filled 2018-04-02: qty 5

## 2018-04-02 MED ORDER — ENOXAPARIN SODIUM 40 MG/0.4ML ~~LOC~~ SOLN
40.0000 mg | SUBCUTANEOUS | Status: DC
  Administered 2018-04-03 – 2018-04-05 (×3): 40 mg via SUBCUTANEOUS
  Filled 2018-04-02 (×3): qty 0.4

## 2018-04-02 MED ORDER — SODIUM CHLORIDE 0.9 % IJ SOLN
INTRAMUSCULAR | Status: DC | PRN
  Administered 2018-04-02: 20 mL via INTRAVENOUS

## 2018-04-02 MED ORDER — SODIUM CHLORIDE 0.9 % IJ SOLN
INTRAMUSCULAR | Status: AC
  Filled 2018-04-02: qty 50

## 2018-04-02 MED ORDER — ONDANSETRON HCL 4 MG/2ML IJ SOLN
INTRAMUSCULAR | Status: AC
  Filled 2018-04-02: qty 2

## 2018-04-02 MED ORDER — ATENOLOL 50 MG PO TABS
100.0000 mg | ORAL_TABLET | Freq: Every day | ORAL | Status: DC
  Administered 2018-04-03 – 2018-04-05 (×3): 100 mg via ORAL
  Filled 2018-04-02 (×3): qty 2

## 2018-04-02 MED ORDER — OXYBUTYNIN CHLORIDE 5 MG PO TABS
5.0000 mg | ORAL_TABLET | Freq: Two times a day (BID) | ORAL | Status: DC
  Administered 2018-04-02 – 2018-04-05 (×6): 5 mg via ORAL
  Filled 2018-04-02 (×6): qty 1

## 2018-04-02 MED ORDER — MIDAZOLAM HCL 2 MG/2ML IJ SOLN
INTRAMUSCULAR | Status: AC
  Filled 2018-04-02: qty 2

## 2018-04-02 MED ORDER — PHENYLEPHRINE HCL 10 MG/ML IJ SOLN
INTRAMUSCULAR | Status: AC
  Filled 2018-04-02: qty 1

## 2018-04-02 MED ORDER — BUPIVACAINE HCL (PF) 0.25 % IJ SOLN
INTRAMUSCULAR | Status: AC
  Filled 2018-04-02: qty 30

## 2018-04-02 MED ORDER — SUGAMMADEX SODIUM 200 MG/2ML IV SOLN
INTRAVENOUS | Status: DC | PRN
  Administered 2018-04-02: 250 mg via INTRAVENOUS

## 2018-04-02 MED ORDER — POLYVINYL ALCOHOL 1.4 % OP SOLN: 1.0000 [drp] | OPHTHALMIC | Status: DC | PRN

## 2018-04-02 MED ORDER — KETOROLAC TROMETHAMINE 15 MG/ML IJ SOLN: 15.0000 mg | Freq: Four times a day (QID) | INTRAMUSCULAR | Status: DC | PRN

## 2018-04-02 MED ORDER — ACETAMINOPHEN 10 MG/ML IV SOLN
1000.0000 mg | Freq: Once | INTRAVENOUS | Status: AC
  Administered 2018-04-02: 1000 mg via INTRAVENOUS

## 2018-04-02 MED ORDER — KETAMINE HCL 10 MG/ML IJ SOLN
INTRAMUSCULAR | Status: AC
  Filled 2018-04-02: qty 1

## 2018-04-02 SURGICAL SUPPLY — 47 items
APL SKNCLS STERI-STRIP NONHPOA (GAUZE/BANDAGES/DRESSINGS) ×3
ATTRACTOMAT 16X20 MAGNETIC DRP (DRAPES) ×4 IMPLANT
BENZOIN TINCTURE PRP APPL 2/3 (GAUZE/BANDAGES/DRESSINGS) ×2 IMPLANT
BLADE EXTENDED COATED 6.5IN (ELECTRODE) ×4 IMPLANT
BRR ADH 6X5 SEPRAFILM 1 SHT (MISCELLANEOUS)
CHLORAPREP W/TINT 26ML (MISCELLANEOUS) ×4 IMPLANT
CLIP VESOCCLUDE LG 6/CT (CLIP) ×4 IMPLANT
CLIP VESOCCLUDE MED 6/CT (CLIP) ×4 IMPLANT
CLIP VESOCCLUDE MED LG 6/CT (CLIP) ×2 IMPLANT
CONT SPEC 4OZ CLIKSEAL STRL BL (MISCELLANEOUS) ×2 IMPLANT
DRAPE INCISE IOBAN 66X45 STRL (DRAPES) IMPLANT
DRAPE UNDERBUTTOCKS STRL (DRAPE) ×4 IMPLANT
DRAPE WARM FLUID 44X44 (DRAPE) ×4 IMPLANT
ELECT REM PT RETURN 15FT ADLT (MISCELLANEOUS) ×4 IMPLANT
GAUZE 4X4 16PLY RFD (DISPOSABLE) IMPLANT
GAUZE SPONGE 4X4 12PLY STRL (GAUZE/BANDAGES/DRESSINGS) ×2 IMPLANT
GLOVE BIOGEL PI IND STRL 7.0 (GLOVE) ×6 IMPLANT
GLOVE BIOGEL PI INDICATOR 7.0 (GLOVE) ×2
GLOVE SURG SS PI 6.5 STRL IVOR (GLOVE) ×8 IMPLANT
GOWN STRL REUS W/ TWL LRG LVL3 (GOWN DISPOSABLE) ×3 IMPLANT
GOWN STRL REUS W/TWL LRG LVL3 (GOWN DISPOSABLE) ×8 IMPLANT
KIT BASIN OR (CUSTOM PROCEDURE TRAY) ×4 IMPLANT
LIGASURE IMPACT 36 18CM CVD LR (INSTRUMENTS) ×4 IMPLANT
NEEDLE HYPO 22GX1.5 SAFETY (NEEDLE) ×8 IMPLANT
NS IRRIG 1000ML POUR BTL (IV SOLUTION) ×4 IMPLANT
PACK GENERAL/GYN (CUSTOM PROCEDURE TRAY) ×4 IMPLANT
RETAINER VISCERA MED (MISCELLANEOUS) ×2 IMPLANT
SEPRAFILM MEMBRANE 5X6 (MISCELLANEOUS) IMPLANT
SHEET LAVH (DRAPES) ×4 IMPLANT
SPONGE LAP 18X18 RF (DISPOSABLE) ×2 IMPLANT
STRIP CLOSURE SKIN 1/2X4 (GAUZE/BANDAGES/DRESSINGS) ×2 IMPLANT
SUCTION POOLE TIP (SUCTIONS) IMPLANT
SUT MNCRL AB 4-0 PS2 18 (SUTURE) ×16 IMPLANT
SUT PDS AB 1 TP1 96 (SUTURE) ×8 IMPLANT
SUT PLAIN 2 0 XLH (SUTURE) ×4 IMPLANT
SUT SILK 2 0 (SUTURE)
SUT SILK 2-0 18XBRD TIE 12 (SUTURE) IMPLANT
SUT VIC AB 0 CT1 18XCR BRD 8 (SUTURE) ×3 IMPLANT
SUT VIC AB 0 CT1 36 (SUTURE) ×24 IMPLANT
SUT VIC AB 0 CT1 8-18 (SUTURE) ×4
SUT VIC AB 4-0 PS2 18 (SUTURE) ×8 IMPLANT
SUT VICRYL 0 TIES 12 18 (SUTURE) ×4 IMPLANT
SYR 30ML LL (SYRINGE) ×8 IMPLANT
SYR BULB IRRIGATION 50ML (SYRINGE) ×2 IMPLANT
TOWEL OR 17X26 10 PK STRL BLUE (TOWEL DISPOSABLE) ×4 IMPLANT
TOWEL OR NON WOVEN STRL DISP B (DISPOSABLE) ×4 IMPLANT
TRAY FOLEY MTR SLVR 14FR STAT (SET/KITS/TRAYS/PACK) ×4 IMPLANT

## 2018-04-02 NOTE — Interval H&P Note (Signed)
History and Physical Interval Note:  04/02/2018 2:26 PM  Jacqueline Atkinson  has presented today for surgery, with the diagnosis of PELVIC MASS  The various methods of treatment have been discussed with the patient and family. After consideration of risks, benefits and other options for treatment, the patient has consented to  Procedure(s): EXPLORATORY LAPAROTOMY (N/A) BILATERAL SALPINGO OOPHORECTOMY (Bilateral) POSSIBLE TOTAL HYSTERECTOMY ABDOMINAL (N/A) POSSIBLE STAGING (N/A) as a surgical intervention .  The patient's history has been reviewed, patient examined, no change in status, stable for surgery.  I have reviewed the patient's chart and labs.  Questions were answered to the patient's satisfaction.     Isabel Caprice

## 2018-04-02 NOTE — Anesthesia Preprocedure Evaluation (Addendum)
Anesthesia Evaluation  Patient identified by MRN, date of birth, ID band Patient awake    Reviewed: Allergy & Precautions, NPO status , Patient's Chart, lab work & pertinent test results, reviewed documented beta blocker date and time   Airway Mallampati: II  TM Distance: >3 FB Neck ROM: Full    Dental no notable dental hx. (+) Teeth Intact, Dental Advisory Given   Pulmonary former smoker,    Pulmonary exam normal breath sounds clear to auscultation       Cardiovascular Exercise Tolerance: Good hypertension, Pt. on medications and Pt. 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 Cerebellar problems and autonomic instabiltiy - hypotension with General anesthesia.  [place].  Neuromuscular disease    GI/Hepatic Neg liver ROS, hiatal hernia, GERD  Medicated and Controlled,Esophageal stricture   Endo/Other  Obesity  Renal/GU negative Renal ROS  negative genitourinary   Musculoskeletal  (+) Arthritis , Osteoarthritis,    Abdominal (+) + obese,   Peds negative pediatric ROS (+)  Hematology negative hematology ROS (+)   Anesthesia Other Findings   Reproductive/Obstetrics negative OB ROS                            Anesthesia Physical  Anesthesia Plan  ASA: III  Anesthesia Plan: General   Post-op Pain Management:    Induction: Intravenous  PONV Risk Score and Plan: 4 or greater and Treatment may vary due to age or medical condition, Ondansetron, Midazolam, Dexamethasone and Propofol infusion  Airway Management Planned: Oral ETT  Additional Equipment: None  Intra-op Plan:   Post-operative Plan: Extubation in OR  Informed Consent: I have reviewed the patients History and Physical, chart, labs and discussed the procedure including the risks,  benefits and alternatives for the proposed anesthesia with the patient or authorized representative who has indicated his/her understanding and acceptance.   Dental advisory given  Plan Discussed with: CRNA, Anesthesiologist and Surgeon  Anesthesia Plan Comments:      Anesthesia Quick Evaluation

## 2018-04-02 NOTE — Transfer of Care (Signed)
Immediate Anesthesia Transfer of Care Note  Patient: Jacqueline Atkinson  Procedure(s) Performed: EXPLORATORY LAPAROTOMY (N/A Abdomen) BILATERAL SALPINGO OOPHORECTOMY (Bilateral Abdomen)  Patient Location: PACU  Anesthesia Type:General  Level of Consciousness: sedated, patient cooperative and responds to stimulation  Airway & Oxygen Therapy: Patient Spontanous Breathing and Patient connected to face mask oxygen  Post-op Assessment: Report given to RN and Post -op Vital signs reviewed and stable  Post vital signs: Reviewed and stable  Last Vitals:  Vitals Value Taken Time  BP 177/91 04/02/2018  5:12 PM  Temp    Pulse 65 04/02/2018  5:14 PM  Resp 13 04/02/2018  5:14 PM  SpO2 99 % 04/02/2018  5:14 PM  Vitals shown include unvalidated device data.  Last Pain:  Vitals:   04/02/18 1138  TempSrc:   PainSc: 0-No pain         Complications: No apparent anesthesia complications

## 2018-04-02 NOTE — Anesthesia Procedure Notes (Signed)
Procedure Name: Intubation Date/Time: 04/02/2018 2:42 PM Performed by: Sharlette Dense, CRNA Patient Re-evaluated:Patient Re-evaluated prior to induction Oxygen Delivery Method: Circle system utilized Preoxygenation: Pre-oxygenation with 100% oxygen Induction Type: IV induction Ventilation: Mask ventilation without difficulty and Oral airway inserted - appropriate to patient size Laryngoscope Size: Sabra Heck and 2 Grade View: Grade I Tube type: Oral Tube size: 7.5 mm Number of attempts: 1 Airway Equipment and Method: Stylet Placement Confirmation: ETT inserted through vocal cords under direct vision,  positive ETCO2 and breath sounds checked- equal and bilateral Secured at: 21 cm Tube secured with: Tape Dental Injury: Teeth and Oropharynx as per pre-operative assessment

## 2018-04-02 NOTE — Op Note (Signed)
Date: 03/05/18   Preoperative Diagnosis: Abdominal-pelvic mass   Postoperative Diagnosis: Bilateral Ovarian cysts   Procedure(s) Performed:  1. Exploratory laparotomy with bilateral salpingo-oophorectomy 2. Pelvic washings    Surgeon: Mart Piggs, MD   Assistant Surgeon: Lahoma Crocker, M.D. (an MD assistant was necessary for tissue manipulation, management of instrumentation, retraction and positioning due to the complexity of the case and hospital policies).   Specimens: Bilateral tubes / ovaries and peritoneal washings   Estimated Blood Loss: < 50 mL.     Complications: None.    Operative Findings:  Left ovarian cyst bilobed ~15x6cm, right ovarian cyst ~5cm.   Anesthesia: GETA   Procedure in Detail:    The patient was taken the operating room.  General endotracheal anesthesia was performed without complications.  The patient was placed in dorsal lithotomy position.  She was prepped and draped in sterile fashion.  A timeout was performed.     A Foley catheter was placed to gravity by me.   A midline vertical incision was made and carried through the subcutaneous tissue to the fascia. The fascial incision was made and extended superiorally. The rectus muscles were separated. The peritoneum was identified and entered. Peritoneal incision was extended longitudinally.  The abdominal cavity was entered sharply and without incident. Exploration revealed the above findings. Washings were collected. A Bookwalter retractor was then placed. A survey of the abdomen and pelvis revealed the above findings.    After packing the bowel into the upper abdomen, we began the procedure by entering the  pelvic sidewall.  The left retroperitoneal space was opened and the left ureter identified.  The overlying infudibulopelvic ligament was isolated, clamped, transected, and ligated.  The adnexa was then skeletonized towards the pelvis.  Heaney clamp was placed across the utero-ovarian ligament.  The specimen was then transected sharply and sent for frozen section.  The pedicle was then suture-ligated with a 0-Vicryl suture.  Hemostasis was noted. The dissection was difficult due to the volume of the mass and minimal remaining space for tissue manipulation.   Attention was turned the right adnexa.  Findings as noted. The right retroperitoneal space was opened.  The right ureter was identified.  The right infundibulopelvic ligament was isolated, clamped, transected, and ligated.  The adnexa was then skeletonized towards the pelvis.  Heaney clamp was placed across the utero-ovarian ligament. The specimen was then transected sharply and sent for frozen section.  The pedicle was then suture-ligated with a 0-Vicryl suture.  Hemostasis was noted.   Frozen section returned with benign bilateral ovarian cysts (cystadenofibroma on left, cystadenoma on right.   Irrigation was used.  Hemostasis was noted.   The fascia was reapproximated with 0 looped PDS using a total of two sutures. The subcutaneous layer was then irrigated copiously and re-approximated with 2-0 plain gut.  The subcutaneous layer was then re-approximated with interrupted 4-0 Vicryl and then running 4-0 Monocryl.  Benzoin and steristrips were placed.   The patient tolerated the procedure well. Sponge, lap and needle counts were correct x 2.

## 2018-04-02 NOTE — Progress Notes (Addendum)
The patient and her family let the nurse know that she is a DNR but on her chart it has she is a full code. RN made a copy of the out of facility DNR sheet and put it in the patient's chart and put a purple bracelet on her wrist. Dr. Denman George was notified.

## 2018-04-03 ENCOUNTER — Encounter (HOSPITAL_COMMUNITY): Payer: Self-pay | Admitting: Obstetrics

## 2018-04-03 LAB — CBC
HCT: 40.1 % (ref 36.0–46.0)
Hemoglobin: 13.3 g/dL (ref 12.0–15.0)
MCH: 29 pg (ref 26.0–34.0)
MCHC: 33.2 g/dL (ref 30.0–36.0)
MCV: 87.6 fL (ref 78.0–100.0)
Platelets: 263 10*3/uL (ref 150–400)
RBC: 4.58 MIL/uL (ref 3.87–5.11)
RDW: 14.1 % (ref 11.5–15.5)
WBC: 14.6 10*3/uL — ABNORMAL HIGH (ref 4.0–10.5)

## 2018-04-03 LAB — BASIC METABOLIC PANEL
Anion gap: 7 (ref 5–15)
BUN: 10 mg/dL (ref 6–20)
CALCIUM: 9.2 mg/dL (ref 8.9–10.3)
CO2: 27 mmol/L (ref 22–32)
CREATININE: 0.8 mg/dL (ref 0.44–1.00)
Chloride: 106 mmol/L (ref 101–111)
GFR calc Af Amer: >60 mL/min (ref >60)
GFR calc non Af Amer: >60 mL/min (ref >60)
GLUCOSE: 133 mg/dL — AB (ref 65–99)
Potassium: 4.2 mmol/L (ref 3.5–5.1)
Sodium: 140 mmol/L (ref 135–145)

## 2018-04-03 MED ORDER — PANTOPRAZOLE SODIUM 40 MG PO TBEC
40.0000 mg | DELAYED_RELEASE_TABLET | Freq: Two times a day (BID) | ORAL | Status: DC
  Administered 2018-04-03 – 2018-04-05 (×4): 40 mg via ORAL
  Filled 2018-04-03 (×4): qty 1

## 2018-04-03 MED ORDER — CLONIDINE HCL 0.2 MG PO TABS
0.4000 mg | ORAL_TABLET | Freq: Every day | ORAL | Status: DC
  Administered 2018-04-03 – 2018-04-04 (×2): 0.4 mg via ORAL
  Filled 2018-04-03 (×2): qty 2

## 2018-04-03 MED ORDER — CLONIDINE HCL 0.2 MG PO TABS
0.2000 mg | ORAL_TABLET | Freq: Every day | ORAL | Status: DC
  Administered 2018-04-04 – 2018-04-05 (×2): 0.2 mg via ORAL
  Filled 2018-04-03 (×2): qty 1

## 2018-04-03 MED ORDER — CHEWING GUM (ORBIT) SUGAR FREE
1.0000 | CHEWING_GUM | Freq: Three times a day (TID) | ORAL | Status: DC
  Administered 2018-04-03 – 2018-04-05 (×7): 1 via ORAL
  Filled 2018-04-03: qty 1

## 2018-04-03 NOTE — Evaluation (Signed)
Physical Therapy Evaluation Patient Details Name: Jacqueline Atkinson MRN: [id number] DOB: 08-16-1956 Today's Date: 04/03/2018   History of Present Illness  Patient is a 62 y/o female admitted wtih bilateral ovarian cysts now s/p exp lap w/ BSO.  PMH positive for HTN, HLD, OA, GERD, PNA and MSA.  Clinical Impression  PAtient presents with decreased independence with mobility due to deficits listed in PT problem list.  Currently min to mod A for mobility, previously was independent with walker.  Will need follow up HHPT at d/c.     Follow Up Recommendations Home health PT;Supervision/Assistance - 24 hour    Equipment Recommendations  None recommended by PT    Recommendations for Other Services       Precautions / Restrictions Precautions Precautions: Fall      Mobility  Bed Mobility Overal bed mobility: Needs Assistance Bed Mobility: Supine to Sit     Supine to sit: HOB elevated;Mod assist     General bed mobility comments: assist for clearing EOB with feet and lifting trunk  Transfers Overall transfer level: Needs assistance Equipment used: 4-wheeled walker Transfers: Sit to/from Stand Sit to Stand: From elevated surface;Min assist         General transfer comment: assist for balance, getting COG over BOS  Ambulation/Gait Ambulation/Gait assistance: Min assist Ambulation Distance (Feet): 400 Feet Assistive device: 4-wheeled walker Gait Pattern/deviations: Step-to pattern;Step-through pattern;Decreased stride length;Wide base of support;Trunk flexed;Shuffle     General Gait Details: cues and facilitation for trunk/posture  as gets flexed at hips and tends to go too fast with walker, but mostly self corrects  Stairs            Wheelchair Mobility    Modified Rankin (Stroke Patients Only)       Balance Overall balance assessment: Needs assistance Sitting-balance support: Feet supported Sitting balance-Leahy Scale: Fair     Standing balance support:  Bilateral upper extremity supported Standing balance-Leahy Scale: Poor Standing balance comment: UE support for balance, increased time to get positioned for balance once standing                             Pertinent Vitals/Pain Pain Assessment: 0-10 Pain Score: 4  Pain Location: incisional Pain Descriptors / Indicators: Sore;Operative site guarding Pain Intervention(s): Monitored during session;Premedicated before session    Home Living Family/patient expects to be discharged to:: Private residence Living Arrangements: Children Available Help at Discharge: Family;Available 24 hours/day Type of Home: House Home Access: Stairs to enter Entrance Stairs-Rails: Right Entrance Stairs-Number of Steps: 3 Home Layout: One level Home Equipment: Walker - 4 wheels;Hand held shower head;Electric scooter;Grab bars - tub/shower;Grab bars - toilet;Shower seat Additional Comments: lift chair    Prior Function Level of Independence: Independent with assistive device(s)               Hand Dominance   Dominant Hand: Right    Extremity/Trunk Assessment        Lower Extremity Assessment Lower Extremity Assessment: LLE deficits/detail LLE Deficits / Details: report get stuck in flexion at times since TKA, strength at least 3/5 hip flexion 4/5 knee extension stiffness and catch noted with flexion  LLE Sensation: decreased light touch(feet/legs feel like "contcrete blocks") LLE Coordination: decreased fine motor       Communication   Communication: No difficulties  Cognition Arousal/Alertness: Awake/alert Behavior During Therapy: WFL for tasks assessed/performed Overall Cognitive Status: Within Functional Limits for tasks assessed  General Comments General comments (skin integrity, edema, etc.): multiple family in the room, discussed 3-4 times a day walking with assist and wrote on white board, RN aware     Exercises General Exercises - Lower Extremity Heel Slides: AAROM;5 reps;Both;Supine   Assessment/Plan    PT Assessment Patient needs continued PT services  PT Problem List Decreased strength;Decreased mobility;Decreased safety awareness;Decreased activity tolerance;Decreased balance;Decreased knowledge of use of DME;Pain       PT Treatment Interventions DME instruction;Functional mobility training;Balance training;Patient/family education;Gait training;Therapeutic activities;Therapeutic exercise;Stair training    PT Goals (Current goals can be found in the Care Plan section)  Acute Rehab PT Goals Patient Stated Goal: to return to independent PT Goal Formulation: With patient/family Time For Goal Achievement: 04/10/18 Potential to Achieve Goals: Good    Frequency Min 3X/week   Barriers to discharge        Co-evaluation               AM-PAC PT "6 Clicks" Daily Activity  Outcome Measure Difficulty turning over in bed (including adjusting bedclothes, sheets and blankets)?: Unable Difficulty moving from lying on back to sitting on the side of the bed? : Unable Difficulty sitting down on and standing up from a chair with arms (e.g., wheelchair, bedside commode, etc,.)?: Unable Help needed moving to and from a bed to chair (including a wheelchair)?: A Little Help needed walking in hospital room?: A Little Help needed climbing 3-5 steps with a railing? : A Lot 6 Click Score: 11    End of Session Equipment Utilized During Treatment: Gait belt Activity Tolerance: Patient tolerated treatment well Patient left: with call bell/phone within reach;in chair(built up with 4 pillows) Nurse Communication: Mobility status PT Visit Diagnosis: Other abnormalities of gait and mobility (R26.89);Muscle weakness (generalized) (M62.81)    Time: 4076-8088 PT Time Calculation (min) (ACUTE ONLY): 33 min   Charges:   PT Evaluation $PT Eval Moderate Complexity: 1 Mod PT Treatments $Gait  Training: 8-22 mins   PT G CodesMagda Atkinson, [place] [phone] 04/03/2018   Jacqueline Atkinson 04/03/2018, 5:00 PM

## 2018-04-03 NOTE — Progress Notes (Signed)
1 Day Post-Op Procedure(s) (LRB): EXPLORATORY LAPAROTOMY (N/A) BILATERAL SALPINGO OOPHORECTOMY (Bilateral)  1522/1522-01   Past Medical History:  Diagnosis Date  . Anemia    hx of  . Anxiety   . Arthritis    Left knee  osteoarthritis  . Edema extremities    lower extremity edema left greater than right.  Marland Kitchen GERD (gastroesophageal reflux disease)   . Heart murmur   . History of hiatal hernia    small  . Hx of seasonal allergies    occ. use OTC Mucinex or Antihistamine.  . Hyperlipidemia   . Hypertension   . Mobility impaired    ambulates with walker, tendency to lose balance.  . Neuromuscular disorder ([place])    "multi system atrophy disease"- "pain, bilateral foot numbness, left knee pain"- Dr. Carles Collet is following.  . Pneumonia   . 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  No nausea.    Objective: Vital signs in last 24 hours: Temp:  [97.6 F (36.4 C)-99 F (37.2 C)] 98.4 F (36.9 C) (06/07 0524) Pulse Rate:  [61-75] 61 (06/07 0524) Resp:  [12-18] 18 (06/07 0524) BP: (137-190)/(74-97) 154/80 (06/07 0524) SpO2:  [93 %-99 %] 97 % (06/07 0524) Weight:  [237 lb (107.5 kg)-239 lb 9.6 oz (108.7 kg)] 239 lb 9.6 oz (108.7 kg) (06/07 0500) Last BM Date: 04/07/18  Intake/Output from previous day: 06/06 0701 - 06/07 0700 In: 2983.3 [P.O.:120; I.V.:2763.3; IV Piggyback:100] Out: 1875 [Urine:1775; Blood:100] No data found.   Physical Examination: General: alert, cooperative and no distress Resp: clear to auscultation bilaterally Cardio: regular rate and rhythm GI: normal findings: soft, non-tender Extremities: extremities normal, atraumatic, no cyanosis or edema  Wound covered, no staining. Occasional bowel sounds.  Labs: WBC/Hgb/Hct/Plts:  14.6/13.3/40.1/263 (06/07 0411) BUN/Cr/glu/ALT/AST/amyl/lip:  10/0.80/--/--/--/--/-- (06/07  0411)  CBC Latest Ref Rng & Units 04/03/2018 03/26/2018 04/23/2016  WBC 4.0 - 10.5 K/uL 14.6(H) 5.3 8.1  Hemoglobin 12.0 - 15.0 g/dL 13.3 13.3 14.1  Hematocrit 36.0 - 46.0 % 40.1 40.3 40.6  Platelets 150 - 400 K/uL 263 175 227   BMP Latest Ref Rng & Units 04/03/2018 03/26/2018 08/04/2017  Glucose 65 - 99 mg/dL 133(H) 98 91  BUN 6 - 20 mg/dL 10 11 12   Creatinine 0.44 - 1.00 mg/dL 0.80 0.78 0.78  BUN/Creat Ratio 12 - 28 - - 15  Sodium 135 - 145 mmol/L 140 140 143  Potassium 3.5 - 5.1 mmol/L 4.2 4.1 4.0  Chloride 101 - 111 mmol/L 106 107 105  CO2 22 - 32 mmol/L 27 24 24   Calcium 8.9 - 10.3 mg/dL 9.2 9.1 9.1      Assessment/Plan:  62 y.o. s/p Procedure(s): EXPLORATORY LAPAROTOMY BILATERAL SALPINGO OOPHORECTOMY: stable  Pain:  Pain is moderately controlled on IV or oral medications.  Neuro: no issues; on all medications for degenerative disorder  Pulm: no issues  Heme: no issues  CV:  Hypertension:  mildly elevated. Current treatment:  2 of her home meds. If still elevated tomorrow will restart remaining home meds.  Prophylaxis: pharmacologic prophylaxis (with any of the following: enoxaparin (Lovenox) 40mg  SQ 2 hours prior to surgery then every day).  GU:  No issues.; good UO  GI:    . Tolerating po: Yes    . Treatment for N/V: zofran PRN.  FEN:  . IVF decrease to [facility] . Elec WNL on POD1 .  Diet advance as tolerated  ID: no issues  Advance diet Encourage ambulation Phys Therapy eval then foley dc  Consults: None  The patient is to be discharged to home. May need Lovenox depending on mobility at time of discharge   LOS: 1 day    Isabel Caprice 04/03/2018, 8:10 AM

## 2018-04-04 LAB — URINE CULTURE

## 2018-04-04 MED ORDER — IRBESARTAN 300 MG PO TABS
300.0000 mg | ORAL_TABLET | Freq: Every day | ORAL | Status: DC
  Administered 2018-04-04 – 2018-04-05 (×2): 300 mg via ORAL
  Filled 2018-04-04 (×2): qty 1

## 2018-04-04 MED ORDER — ENOXAPARIN (LOVENOX) PATIENT EDUCATION KIT
PACK | Freq: Once | Status: AC
  Administered 2018-04-04: 12:00:00
  Filled 2018-04-04: qty 1

## 2018-04-04 MED ORDER — ACETAMINOPHEN 500 MG PO TABS
500.0000 mg | ORAL_TABLET | Freq: Four times a day (QID) | ORAL | Status: DC
  Administered 2018-04-04 – 2018-04-05 (×4): 500 mg via ORAL
  Filled 2018-04-04 (×4): qty 1

## 2018-04-04 NOTE — Progress Notes (Signed)
2 Days Post-Op Procedure(s) (LRB): EXPLORATORY LAPAROTOMY (N/A) BILATERAL SALPINGO OOPHORECTOMY (Bilateral)  1522/1522-01   Past Medical History:  Diagnosis Date  . Anemia    hx of  . Anxiety   . Arthritis    Left knee  osteoarthritis  . Edema extremities    lower extremity edema left greater than right.  Marland Kitchen GERD (gastroesophageal reflux disease)   . Heart murmur   . History of hiatal hernia    small  . Hx of seasonal allergies    occ. use OTC Mucinex or Antihistamine.  . Hyperlipidemia   . Hypertension   . Mobility impaired    ambulates with walker, tendency to lose balance.  . Neuromuscular disorder ([place])    "multi system atrophy disease"- "pain, bilateral foot numbness, left knee pain"- Dr. Carles Collet is following.  . Pneumonia   . 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 No N/V. Tolerating regular diet. Passing flatus. Saw PTx yesterday.  Objective: Vital signs in last 24 hours: Temp:  [98 F (36.7 C)-98.6 F (37 C)] 98.6 F (37 C) (06/08 0513) Pulse Rate:  [58-72] 58 (06/08 0513) Resp:  [16-17] 16 (06/08 0513) BP: (137-173)/(68-88) 173/88 (06/08 0513) SpO2:  [94 %-98 %] 97 % (06/08 0513) Weight:  [246 lb 1.6 oz (111.6 kg)] 246 lb 1.6 oz (111.6 kg) (06/08 0513) Last BM Date: 03/31/18  Intake/Output from previous day: 06/07 0701 - 06/08 0700 In: 922.7 [P.O.:720; I.V.:202.7] Out: 1100 [Urine:1100] No data found.   Physical Examination: General: alert, cooperative and no distress Resp: clear to auscultation bilaterally Cardio: regular rate and rhythm GI: normal findings: soft, non-tender Extremities: extremities normal, atraumatic, no cyanosis or edema  Wound CDI with SS intact, mild staining and ecchymosis at sites of injection. + bowel sounds, soft NTND.  Labs:      CBC Latest Ref Rng & Units 04/03/2018 03/26/2018 04/23/2016  WBC 4.0 - 10.5  K/uL 14.6(H) 5.3 8.1  Hemoglobin 12.0 - 15.0 g/dL 13.3 13.3 14.1  Hematocrit 36.0 - 46.0 % 40.1 40.3 40.6  Platelets 150 - 400 K/uL 263 175 227   BMP Latest Ref Rng & Units 04/03/2018 03/26/2018 08/04/2017  Glucose 65 - 99 mg/dL 133(H) 98 91  BUN 6 - 20 mg/dL 10 11 12   Creatinine 0.44 - 1.00 mg/dL 0.80 0.78 0.78  BUN/Creat Ratio 12 - 28 - - 15  Sodium 135 - 145 mmol/L 140 140 143  Potassium 3.5 - 5.1 mmol/L 4.2 4.1 4.0  Chloride 101 - 111 mmol/L 106 107 105  CO2 22 - 32 mmol/L 27 24 24   Calcium 8.9 - 10.3 mg/dL 9.2 9.1 9.1      Assessment/Plan:  62 y.o. s/p Procedure(s): EXPLORATORY LAPAROTOMY BILATERAL SALPINGO OOPHORECTOMY: stable  Pain:  Pain well controlled controlled on oral medications.  Neuro: no issues; on all medications for degenerative disorder  Pulm: no issues  Heme: no issues  CV:  Hypertension:  mildly elevated. Current treatment:  2 of her home meds. Restart Mycardis.   Prophylaxis: pharmacologic prophylaxis (with any of the following: enoxaparin (Lovenox) 40mg  SQ 2 hours prior to surgery then every day).  GU:  No issues.; good UO  GI:    . Tolerating po: Yes    . Treatment for N/V: zofran PRN.  FEN:  . IVF decrease to [facility] . Elec WNL on POD1 . Diet advance as tolerated  ID: no  issues  Consults: None  The patient may be discharged home as soon as tomorrow.  Will need home PTx  Will need Lovenox teaching to family.   LOS: 2 days    Isabel Caprice 04/04/2018, 8:03 AM

## 2018-04-05 MED ORDER — HYDROCODONE-ACETAMINOPHEN 5-325 MG PO TABS: 1.0000 | ORAL_TABLET | ORAL | 0 refills | Status: DC | PRN

## 2018-04-05 MED ORDER — ENOXAPARIN SODIUM 40 MG/0.4ML ~~LOC~~ SOLN: 40.0000 mg | SUBCUTANEOUS | 0 refills | Status: DC

## 2018-04-05 NOTE — Care Management Important Message (Signed)
Important Message  Patient Details  Name: TIEA MANNINEN MRN: [id number] Date of Birth: 1956/03/04   Medicare Important Message Given:  Yes    Erenest Rasher, RN 04/05/2018, 11:31 AM

## 2018-04-05 NOTE — Progress Notes (Signed)
Discharge instructions given to pt and all questions were answered.  

## 2018-04-05 NOTE — Discharge Instructions (Signed)
04/05/2018   Activity: 1. Be up and out of the bed during the day.  Take a nap if needed.  You may walk up steps but be careful and use the hand rail.  Stair climbing will tire you more than you think, you may need to stop part way and rest.   2. No lifting, pulling, pushing, or straining anything more than 5 pounds for 4 weeks. You may be able to return to full activity at 6 weeks, but this will be further discussed at your postoperative visits.  3. No driving for 2 weeks.  Do Not drive if you are taking narcotic pain medicine.  4. Shower daily.  Use soap and water on your incision and pat dry; don't rub.   5. No sexual activity and nothing in the vagina for 4 weeks.  Medications:  - As long as you have never been told to avoid ibuprofen and/or tylenol use these first for pain control. Take these regularly (every 6 hours) to decrease the build up of pain.  - If necessary, for severe pain not relieved by the above, take your pain prescription.  - While taking your prescription pain medication you should take stool softener (I prefer you purchase docusate sodium "Colace") 2-3 times per day to reduce the likelihood of constipation. If this causes diarrhea, stop its use.  Diet: 1. Low sodium Heart Healthy Diet is recommended. 2. It is safe to use a gentle laxative if you have difficulty moving your bowels as long as you have no nausea or vomiting and you are passing flatus.  Wound Care: 1. Keep clean and dry.  Shower daily. 2. If you have steri-strips on your incision these will fall off after 2-3 weeks. At 3 weeks you may take these off carefully after showering.   Reasons to call the Doctor:   Fever - Oral temperature greater than 100.4 degrees Fahrenheit  Foul-smelling vaginal discharge  Difficulty urinating  Nausea and vomiting  Increased pain at the site of the incision that is unrelieved with pain medicine.  Difficulty breathing with or without chest pain  New calf pain  especially if only on one side  Sudden, continuing increased vaginal bleeding/drainage with or without clots.   Follow-up: 1. See Dr. Gerarda Fraction in 1-2 weeks.  Contacts: For questions or concerns you should contact:  Our office [phone] After hours and on week-ends for urgent issues relating to our treatment for you, please call [phone] and ask to speak to the physician on call for Gynecologic Oncology  We will not refill pain medications if we are not your primary provider or after hours. Plan accordingly if you are running low.

## 2018-04-05 NOTE — Anesthesia Postprocedure Evaluation (Signed)
Anesthesia Post Note  Patient: Jacqueline Atkinson  Procedure(s) Performed: EXPLORATORY LAPAROTOMY (N/A Abdomen) BILATERAL SALPINGO OOPHORECTOMY (Bilateral Abdomen)     Patient location during evaluation: PACU Anesthesia Type: General Level of consciousness: awake and alert Pain management: pain level controlled Vital Signs Assessment: post-procedure vital signs reviewed and stable Respiratory status: spontaneous breathing, nonlabored ventilation, respiratory function stable and patient connected to nasal cannula oxygen Cardiovascular status: blood pressure returned to baseline and stable Postop Assessment: no apparent nausea or vomiting Anesthetic complications: no    Last Vitals:  Vitals:   04/05/18 0524 04/05/18 0941  BP: (!) 165/82 (!) 158/93  Pulse: (!) 50 (!) 52  Resp: 18   Temp: 36.7 C   SpO2: 96% 99%    Last Pain:  Vitals:   04/05/18 0841  TempSrc:   PainSc: 3                  Audry Pili

## 2018-04-05 NOTE — Discharge Summary (Signed)
Physician Discharge Summary  Patient ID: Jacqueline Atkinson MRN: [id number] DOB/AGE: 1955/11/13 62 y.o.  Admit date: 04/02/2018 Discharge date: 04/05/2018  Admission Diagnoses: Pelvic mass and abdominal swelling  Discharge Diagnoses:  Active Problems:   Pelvic mass   Intra-abdominal and pelvic swelling, mass and lump, unspecified site   Discharged Condition: stable  Hospital Course:  1/ patient was admitted on 04/02/18 for Exploratory laparotomy and mass resection 2/ surgery was uncomplicated  3/ on postoperative day 3 the patient was meeting discharge criteria: tolerating PO, voiding urine, ambulating, pain well controlled on oral medications, passing flatus. 4/ new medications on discharge include Vicodin and Lovenox.  Home health for nursing and PTx ordered  Consults: PTx  Significant Diagnostic Studies: usual postoperative care  Treatments: surgery: and usual postoperative care  Discharge Exam: Blood pressure (!) 158/93, pulse (!) 52, temperature 98 F (36.7 C), temperature source Oral, resp. rate 18, height 5\' 8"  (1.727 m), weight 242 lb 6.4 oz (110 kg), SpO2 99 %. General appearance: alert, cooperative and no distress Resp: clear to auscultation bilaterally Cardio: regular rate and rhythm and murmur GI: soft, non-tender; bowel sounds normal; no masses,  no organomegaly Extremities: extremities normal, atraumatic, no cyanosis or edema  Disposition:     Follow-up Information    Precious Haws B, MD Follow up in 1 week(s).   Specialty:  Gynecologic Oncology Why:  call to confirm followup. Should be scheduled in next 7-10 days Contact information: [place] [address] [phone]           Signed: Isabel Caprice 04/05/2018, 9:45 AM

## 2018-04-05 NOTE — Care Management Note (Signed)
Case Management Note  Patient Details  Name: Jacqueline Atkinson MRN: [id number] Date of Birth: 18-Jan-1956  Subjective/Objective:      Pelvic mass, 04/02/18 for Exploratory laparotomy and mass resection              Action/Plan: 04/05/2018 1131 am NCM spoke to pt and dtrs at bedside. Offered choice for HH/list provided. Pt states she has DME from [facility]. Contacted [facility] with new referral for [facility]. Pt has RW, Rollator, hospital bed, lift chair, scooter, handicap bathroom and handicap van. Has 4 daughters at home to assist with care. Has a dtr that is a Therapist, sports to assist with injections.   Expected Discharge Date:  04/05/18               Expected Discharge Plan:  [place]  In-House Referral:  NA  Discharge planning Services  CM Consult  Post Acute Care Choice:  Home Health Choice offered to:  Patient  DME Arranged:  [facility] DME Agency:  NA  HH Arranged:  RN, PT [place] Agency:  [place]  Status of Service:  Completed, signed off  If discussed at [place] of Stay Meetings, dates discussed:    Additional Comments:  Erenest Rasher, RN 04/05/2018, 5:20 PM

## 2018-04-07 ENCOUNTER — Encounter: Payer: Medicare Other | Admitting: Occupational Therapy

## 2018-04-07 ENCOUNTER — Ambulatory Visit: Payer: Medicare Other | Admitting: Physical Therapy

## 2018-04-07 DIAGNOSIS — G319 Degenerative disease of nervous system, unspecified: Secondary | ICD-10-CM | POA: Diagnosis not present

## 2018-04-07 DIAGNOSIS — K219 Gastro-esophageal reflux disease without esophagitis: Secondary | ICD-10-CM | POA: Diagnosis not present

## 2018-04-07 DIAGNOSIS — Z48816 Encounter for surgical aftercare following surgery on the genitourinary system: Secondary | ICD-10-CM | POA: Diagnosis not present

## 2018-04-07 DIAGNOSIS — E785 Hyperlipidemia, unspecified: Secondary | ICD-10-CM | POA: Diagnosis not present

## 2018-04-07 DIAGNOSIS — I10 Essential (primary) hypertension: Secondary | ICD-10-CM | POA: Diagnosis not present

## 2018-04-07 DIAGNOSIS — M1990 Unspecified osteoarthritis, unspecified site: Secondary | ICD-10-CM | POA: Diagnosis not present

## 2018-04-08 NOTE — Progress Notes (Signed)
Progress Note: Gyn-Onc Followup Est Patient  Consult was originally requested by Dr. Tiana Loft  CC:  Chief Complaint  Patient presents with  . Ovarian Cancer    Stage IA Serous Borderline Left Ovary    HPI: [name]  is a very nice 62 y.o. 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  This was diagnosed in 2015.  She states is a progressive neurologic disease and most people end up in a wheelchair possibly succumbed to their disease less than 10 years from the time of diagnosis.  She does currently have limited ambulation ability and presents today with a walker.  She is mostly concerned about her quality of life specifically the discomfort with standing for long periods while she is cooking.  Of less concern is the potential for malignancy as she does worry that treatment of such a process would decrease the quality of life of the possibly limited time she has left.   Interval History Since her last visit I took her to the operating room on 03/05/18 and performed exploratory laparotomy with bilateral salpingo-oophorectomy with pelvicwashings. Frozen section revealed benign bilateral tubes/ovaries. However, final pathology revealed: 1. Ovary and fallopian tube, left - SEROUS BORDERLINE TUMOR/ATYPICAL PROLIFERATIVE SEROUS TUMOR (3.2 CM) CONFINED TO THE OVARIAN PARENCHYMA - BENIGN FALLOPIAN TUBE - SEE ONCOLOGY TABLE AND COMMENT BELOW;  Tumor Size: 3.2 x 1.5 cm Histologic Type: Serous borderline tumor/atypical proliferative serous tumor Histologic Grade: Low grade Implants: Not identified 2. Ovary and fallopian tube, right - BENIGN SEROUS CYSTADENOFIBROMA (8.2 CM) - BENIGN FALLOPIAN TUBE WITH PARATUBAL CYST (0.8 CM) 3. 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  She returns  today for an early postoperative check. She is doing well with pain controlled, normal bowel (for her) and bladder function. No issues with the wound. No fevers.    Oncologic History Beginning October 2018 the patient noted some left lower back pain which worsened with standing.  Specifically it had been affecting her ADLs by interfering with her cooking meals.  At first she thought this was related to problems with her gait or her bony structure and she followed up with both physical therapy and orthopedics who felt that further work-up should be undertaken.  Recommendation was for imaging and a CT scan was ordered by her orthopedic surgeon December 2018.  She was informed that there was a GYN process for which she should follow-up with gynecology however she postponed this as she felt that this would not be the explanation for the pain.  Ultimately she did follow-up with Dr. Tiana Loft.  Initial thought process was this may be a fibroid uterus and ultrasound was performed.  This revealed normal uterus but a large cystic structure possibly both one on the right measuring 8 cm and another on the left measuring 8 cm uncertain origin possibly bowel in nature and therefore an MRI was requested.  MRI revealed normal uterus with normal endometrial lining however a large multiloculated cystic mass was noted with numerous internal enhancing septations.  A small unilocular cyst was noted in the left ovary measuring 2.7 cm but the main mass up to 14 x 9 x 17 cm.  No free fluid or ascites.  There are internal areas of enhancing septations which are mildly thickened in the larger  mass.  A Ca125 is normal at 13.1 on 02/23/2018.  In addition a CEA is noted to be normal at 1.2.  Given these findings patient was referred for management  She denied nausea and vomiting she had no significant changes in her body weight.  She noted decreased appetite and some early satiety.  Her normal bowel movements are every 4 to 5  days and that had not changed.  She noted some bladder spasms over the year prior. Pain was lower back upper left abdomen into her thighs 3 out of 10 which lasted in varying intervals     She is unsure what the side effects of her sulfa allergy ER.  She received this as a teenager.  Her neurologist for her degenerative disorder is Wells Guiles Tat, DO   Measurement of disease:  Follow tumor markers HOWEVER normal preop CA125 . Preoperative Ca1 25 equal 13.3, CEA 1.2  Radiology: . 10/02/2017-CT pelvis-[facility]-left ovary unremarkable right ovary not visualized multiple complex low-density structures arising from the uterus which may represent fibroids the largest measuring 9 cm. . 02/23/2018-transvaginal transabdominal ultrasound [facility]-large simple multicystic areas bilaterally within the adnexa felt to be ovarian in nature left 8 x 5 x 6.8, left 7.8 x 5.9 x 5.2.  Vascularity is noted in the septations between the cyst?  On transabdominal imaging there is a midline simple cyst 8.5 x 7 x 9.4. Marland Kitchen 02/28/2018-MRI pelvis-[facility]-uterus 5.6 x 4.8 x 5.2 with an unremarkable endometrium.  Large multiloculated cystic mass 14.9 x 9.9 x 17.1.  On previous exam which is the CT scan from December 2018 this is increased in size.  There are numerous internal areas of enhancing septation some of the septations are thin others are irregular and thickened up to 3 mm.  In the dominant locule there are multiple tiny mural nodules up to 3 mm.  There is a small unilocular cyst in the left ovary 2.7 cm.  No ascites or peritoneal nodularity identified.  Current Meds:  Outpatient Encounter Medications as of 04/10/2018  Medication Sig  . acetaminophen (TYLENOL) 500 MG tablet Take 1,000 mg by mouth every 8 (eight) hours as needed for moderate pain.   Marland Kitchen atenolol (TENORMIN) 100 MG tablet Take 1 tablet (100 mg total) by mouth daily.  . carbidopa-levodopa (SINEMET CR) 50-200 MG tablet Take 1 tablet by mouth at bedtime.  .  carbidopa-levodopa (SINEMET IR) 25-100 MG tablet TAKE 1 TABLET BY MOUTH 6 TIMES A DAY and 1-2 PRN daily (Patient taking differently: Take 1 tablet by mouth See admin instructions. TAKE 1 TABLET BY MOUTH 8 TIMES A DAY)  . Carboxymethylcellul-Glycerin (LUBRICATING EYE DROPS OP) Place 1 drop into both eyes 3 (three) times daily as needed (dry eyes).  . clonazePAM (KLONOPIN) 0.5 MG tablet Take 1 tablet (0.5 mg total) by mouth at bedtime.  . cloNIDine (CATAPRES) 0.2 MG tablet Take 1 tab po qam and 2 tabs po qhs (Patient taking differently: Take 0.2 mg by mouth 2 (two) times daily. )  . enoxaparin (LOVENOX) 40 MG/0.4ML injection Inject 0.4 mLs (40 mg total) into the skin daily.  . hydrALAZINE (APRESOLINE) 50 MG tablet Take 1 tablet (50 mg total) by mouth 4 (four) times daily. As needed for elevated BP and take 1 po qhs (Patient taking differently: Take 50 mg by mouth 4 (four) times daily as needed. As needed for elevated BP and take 1 po qhs)  . mirabegron ER (MYRBETRIQ) 50 MG TB24 tablet Take 50 mg by mouth daily.  Marland Kitchen  omeprazole (PRILOSEC) 20 MG capsule Take 20 mg by mouth 2 (two) times daily before a meal.  . oxybutynin (DITROPAN) 5 MG tablet Take 5 mg by mouth 2 (two) times daily.   Marland Kitchen senna-docusate (SENOKOT S) 8.6-50 MG tablet Take 2 tablets by mouth at bedtime.  Marland Kitchen telmisartan (MICARDIS) 80 MG tablet Take 1 tablet (80 mg total) by mouth daily.  . [DISCONTINUED] HYDROcodone-acetaminophen (NORCO/VICODIN) 5-325 MG tablet Take 1 tablet by mouth every 4 (four) hours as needed for moderate pain. (Patient not taking: Reported on 04/10/2018)   No facility-administered encounter medications on file as of 04/10/2018.     Allergy:  Allergies  Allergen Reactions  . Sulfa Antibiotics     Long time ago and does not remember reaction     Social Hx:  Tobacco use: 18-pack-year quit in 1989 Alcohol use: None Illicit Drug use: None Illicit IV Drug use: None    Past Surgical Hx:  Past Surgical History:   Procedure Laterality Date  . ABDOMINAL HYSTERECTOMY     04-02-18 Dr. Gerarda Fraction  . DILATION AND CURETTAGE, DIAGNOSTIC / THERAPEUTIC     1981 and 1998 due to missed AB  . ESOPHAGOGASTRODUODENOSCOPY ENDOSCOPY     with esophageal dilation  . FOOT SURGERY  2011   3rd metatarsal LT foot  . KNEE ARTHROSCOPY Left 02/08/2015   Procedure: LEFT KNEE ARTHROSCOPY ;  Surgeon: Latanya Maudlin, MD;  Location: [facility];  Service: Orthopedics;  Laterality: Left;  . LAPAROTOMY N/A 04/02/2018   Procedure: EXPLORATORY LAPAROTOMY;  Surgeon: Isabel Caprice, MD;  Location: [facility];  Service: Gynecology;  Laterality: N/A;  . SALPINGOOPHORECTOMY Bilateral 04/02/2018   Procedure: BILATERAL SALPINGO OOPHORECTOMY;  Surgeon: Isabel Caprice, MD;  Location: [facility];  Service: Gynecology;  Laterality: Bilateral;  . TONSILLECTOMY AND ADENOIDECTOMY    . TOTAL KNEE ARTHROPLASTY Left 08/22/2015   Procedure: LEFT TOTAL  KNEE  ARTHROPLASTY;  Surgeon: Latanya Maudlin, MD;  Location: [facility];  Service: Orthopedics;  Laterality: Left;    Past Medical Hx:  Past Medical History:  Diagnosis Date  . Anemia    hx of  . Anxiety   . Arthritis    Left knee  osteoarthritis  . GERD (gastroesophageal reflux disease)   . Heart murmur   . History of hiatal hernia    small  . Hx of seasonal allergies    occ. use OTC Mucinex or Antihistamine.  . Hyperlipidemia   . Hypertension   . Neuromuscular disorder ([place])    "multi system atrophy disease"- "pain, bilateral foot numbness, left knee pain"- Dr. Carles Collet is following.  . Ovarian cancer on left ([place]) 2019   serous borderline s/p BSO  . Pneumonia   . Shoulder disorder    right shoulder "rigidity due to Multi system atrophy"- ROM not limited"just tires me"    Past Gynecological History:   GYNECOLOGIC HISTORY:  No LMP recorded. Patient is postmenopausal. Menarche: 62 years old P 7; +5 miscarriages LMP 2008 Contraceptive oral contraceptives less than 10 years also used a diaphragm in the  past HRT none  Last Pap 01/2018 negative for malignancy satisfactory for evaluation no HPV performed  Family Hx:  Family History  Problem Relation Age of Onset  . Diabetes Mother   . Kidney disease Mother   . Melanoma Mother        arm  . COPD Father   . Brain cancer Brother   . Melanoma Daughter     Review of Systems:  Review of  Systems  Gastrointestinal: Positive for constipation.  Musculoskeletal: Positive for back pain.  All other systems reviewed and are negative.    Vitals:  Blood pressure (!) 109/59, pulse 64, temperature 97.9 F (36.6 C), temperature source Oral, resp. rate 20, height 5\' 8"  (1.727 m), weight 234 lb (106.1 kg), SpO2 99 %. Body mass index is 35.58 kg/m.   Physical Exam: ECOG PERFORMANCE STATUS: 0 - Asymptomatic   General :  Well developed, 62 y.o., female in no apparent distress HEENT:  Normocephalic/atraumatic, symmetric, EOMI, eyelids normal Neck:   No visible masses.  Respiratory:  Respirations unlabored, no use of accessory muscles CV:   Deferred Breast:  Deferred Musculoskeletal: Normal muscle strength. Abdomen:  Wound CDI with some ecchymoses peri-incisional.  No visible masses or protrusion. NT. Extremities:  No visible edema or deformities Skin:   Normal inspection Neuro/Psych:  No focal motor deficit, no abnormal mental status. Normal gait. Normal affect. Alert and oriented to person, place, and time    Oncologic Summary: 1. 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 Neuro degenerative disorder   Assessment/Plan: 1. Serous Borderline Ovarian CA ? We reviewed the reports and she was given a copy ? I do not recommend further surgery to stage the peritoneum. In addition, there is data that hysterectomy is not necessary.  ? I will present her at tumor conference to be sure plan for observation agreed. ? Needs a CXR to complete staging. 2. Surveillance plan ? RTC Q 6 months for pelvic and  CA125 ? NOTE though that CA125 was not elevated preop. Thus offering intermittent imaging reasonable  3. Postop management ? RTC one month for final wound check ? Activity restrictions reviewed ? EKG copy given to patient and already routed to PCP (new BBB?)      Isabel Caprice, MD  04/10/2018, 10:29 AM  Cc: Tiana Loft, MD (Referring OB/GYN) [name], MD (PCP) Alonza Bogus, DO (Neurology)

## 2018-04-09 ENCOUNTER — Telehealth: Payer: Self-pay | Admitting: Family Medicine

## 2018-04-09 ENCOUNTER — Telehealth: Payer: Self-pay

## 2018-04-09 NOTE — Telephone Encounter (Signed)
Phone call to Clair Gulling, verbal given for PT once a week for three weeks.   Clair Gulling would like a copy of patient's medications to update their records. Med list faxed to [phone] per Jim's request.

## 2018-04-09 NOTE — Telephone Encounter (Signed)
Copied from [employer] [id number]. Topic: Quick Communication - See Telephone Encounter >> Apr 09, 2018  9:17 AM Percell Belt A wrote: CRM for notification. See Telephone encounter for: 04/09/18. Clair Gulling with [facility] -[phone] He seen pt last night after being discharged from hospital  Bp value -140/98- pt stated this is her normal  Pt stated that her bp med dose is different from bottle and discharge papers He is needing a updated med list - Fax number [phone]  Need verbal for PT  1 week 3 Until she starts outpatient Neuo PT

## 2018-04-09 NOTE — Telephone Encounter (Addendum)
Herbert Deaner PT with [facility] [phone], patient has been evaluated for PT, verifying that pt can have PT "1 time a week for 3 weeks for functional mobility" .  Approved by  Joylene John NP at this time. No other needs at this time.

## 2018-04-10 ENCOUNTER — Ambulatory Visit (HOSPITAL_COMMUNITY)
Admission: RE | Admit: 2018-04-10 | Discharge: 2018-04-10 | Disposition: A | Discharge status: Home / Self Care | Payer: Medicare Other | Source: Ambulatory Visit | Attending: Obstetrics | Admitting: Obstetrics

## 2018-04-10 ENCOUNTER — Encounter: Payer: Self-pay | Admitting: Obstetrics

## 2018-04-10 ENCOUNTER — Telehealth: Payer: Self-pay

## 2018-04-10 ENCOUNTER — Inpatient Hospital Stay: Payer: Medicare Other | Attending: Obstetrics | Admitting: Obstetrics

## 2018-04-10 VITALS — BP 109/59 | HR 64 | Temp 97.9°F | Resp 20 | Ht 68.0 in | Wt 234.0 lb

## 2018-04-10 DIAGNOSIS — R351 Nocturia: Secondary | ICD-10-CM | POA: Diagnosis not present

## 2018-04-10 DIAGNOSIS — C562 Malignant neoplasm of left ovary: Secondary | ICD-10-CM | POA: Diagnosis present

## 2018-04-10 DIAGNOSIS — Z90722 Acquired absence of ovaries, bilateral: Secondary | ICD-10-CM | POA: Insufficient documentation

## 2018-04-10 DIAGNOSIS — C569 Malignant neoplasm of unspecified ovary: Secondary | ICD-10-CM | POA: Insufficient documentation

## 2018-04-10 DIAGNOSIS — N3946 Mixed incontinence: Secondary | ICD-10-CM | POA: Diagnosis not present

## 2018-04-10 DIAGNOSIS — Z90710 Acquired absence of both cervix and uterus: Secondary | ICD-10-CM | POA: Insufficient documentation

## 2018-04-10 DIAGNOSIS — R1900 Intra-abdominal and pelvic swelling, mass and lump, unspecified site: Secondary | ICD-10-CM | POA: Insufficient documentation

## 2018-04-10 DIAGNOSIS — R079 Chest pain, unspecified: Secondary | ICD-10-CM | POA: Diagnosis not present

## 2018-04-10 NOTE — Telephone Encounter (Signed)
Ongoing call to patient per Joylene John NP regarding recent chest xray, results are "normal" - pt voiced understanding. No other needs per pt at this time.

## 2018-04-10 NOTE — Patient Instructions (Signed)
1. RTC one month for a wound check 2. Continued activity restrictions

## 2018-04-20 ENCOUNTER — Telehealth: Payer: Self-pay | Admitting: Oncology

## 2018-04-20 ENCOUNTER — Telehealth: Payer: Self-pay | Admitting: Gynecologic Oncology

## 2018-04-20 ENCOUNTER — Encounter: Payer: Self-pay | Admitting: Gynecologic Oncology

## 2018-04-20 NOTE — Telephone Encounter (Signed)
Returned call to patient.  She had called earlier today stating she stopped taking her lovenox on Sunday due to bruising (lemon size) and she took a 325 mg aspirin today.  She states she has taken over half of the prescription for the lovenox and wants to know if she has to continue.  She is also asking if she can get in the pool.  Spoke with Dr. Gerarda Fraction who recommends the patient completing the full course of lovenox.  Advised that aspirin does not work to prevent DVT like the lovenox.  Advised no swimming until seen in the office at her post-op.  Also advised that if the bruising is concerning, to come into the office for evaluation but she does not feel that is necessary.  Reportable signs and symptoms reviewed.  Advised to keep [place] updated and to call for any needs.

## 2018-04-20 NOTE — Progress Notes (Signed)
Gynecologic Oncology Multi-Disciplinary Disposition Conference Note  Date of the Conference: April 20, 2018  Patient Name: Jacqueline Atkinson [facility]  Referring Provider: Dr. Murrell Redden Primary GYN Oncologist: Dr. Precious Haws  Stage/Disposition:  Stage IA ovarian serous borderline tumor of left ovary. Disposition is to close surveillance.   This Multidisciplinary conference took place involving physicians from [place], [place], Radiation Oncology, Pathology, Radiology along with the Gynecologic Oncology Nurse Practitioner and RN.  Comprehensive assessment of the patient's malignancy, staging, need for surgery, chemotherapy, radiation therapy, and need for further testing were reviewed. Supportive measures, both inpatient and following discharge were also discussed. The recommended plan of care is documented. Greater than 35 minutes were spent correlating and coordinating this patient's care.

## 2018-04-20 NOTE — Telephone Encounter (Signed)
Called Jacqueline Atkinson and notified her that the tumor board was held this morning and the recommendation is for close observation.  She verbalized understanding and agreement.

## 2018-04-28 ENCOUNTER — Other Ambulatory Visit: Payer: Self-pay

## 2018-04-28 ENCOUNTER — Ambulatory Visit: Payer: Medicare Other | Attending: Neurology | Admitting: Speech Pathology

## 2018-04-28 ENCOUNTER — Encounter: Payer: Self-pay | Admitting: Physical Therapy

## 2018-04-28 ENCOUNTER — Encounter: Payer: Self-pay | Admitting: Speech Pathology

## 2018-04-28 ENCOUNTER — Ambulatory Visit: Payer: Medicare Other | Admitting: Occupational Therapy

## 2018-04-28 ENCOUNTER — Ambulatory Visit: Payer: Medicare Other | Admitting: Physical Therapy

## 2018-04-28 ENCOUNTER — Encounter: Payer: Self-pay | Admitting: Occupational Therapy

## 2018-04-28 DIAGNOSIS — R4701 Aphasia: Secondary | ICD-10-CM | POA: Diagnosis present

## 2018-04-28 DIAGNOSIS — R2681 Unsteadiness on feet: Secondary | ICD-10-CM | POA: Diagnosis present

## 2018-04-28 DIAGNOSIS — M25611 Stiffness of right shoulder, not elsewhere classified: Secondary | ICD-10-CM | POA: Diagnosis present

## 2018-04-28 DIAGNOSIS — R29898 Other symptoms and signs involving the musculoskeletal system: Secondary | ICD-10-CM | POA: Diagnosis present

## 2018-04-28 DIAGNOSIS — R29818 Other symptoms and signs involving the nervous system: Secondary | ICD-10-CM | POA: Diagnosis present

## 2018-04-28 DIAGNOSIS — R278 Other lack of coordination: Secondary | ICD-10-CM

## 2018-04-28 DIAGNOSIS — R41841 Cognitive communication deficit: Secondary | ICD-10-CM | POA: Insufficient documentation

## 2018-04-28 DIAGNOSIS — R293 Abnormal posture: Secondary | ICD-10-CM

## 2018-04-28 DIAGNOSIS — R2689 Other abnormalities of gait and mobility: Secondary | ICD-10-CM

## 2018-04-28 NOTE — Patient Instructions (Signed)
Language activities to do at home - games or apps  Monument a 3 ring bider with a section for OT/PT/ST and a copy of your schedule   Memory Compensation Strategies  1. Use "WARM" strategy. W= write it down A=  associate it R=  repeat it M=  make a mental picture  2. You can keep a Social worker. Use a 3-ring notebook with sections for the following:  calendar, important names and phone numbers, medications, doctors' names/phone numbers, "to do list"/reminders, and a section to journal what you did each day  3. Use a calendar to write appointments down.  4. Write yourself a schedule for the day.  This can be placed on the calendar or in a separate section of the Memory Notebook.  Keeping a regular schedule can help memory.  5. Use medication organizer with sections for each day or morning/evening pills  You may need help loading it  6. Keep a basket, or pegboard by the door.   Place items that you need to take out with you in the basket or on the pegboard.  You may also want to include a message board for reminders.  7. Use sticky notes. Place sticky notes with reminders in a place where the task is performed.  For example:  "turn off the stove" placed by the stove, "lock the door" placed on the door at eye level, "take your medications" on the bathroom mirror or by the place where you normally take your medications  8. Use alarms/timers.  Use while cooking to remind yourself to check on food or as a reminder to take your medicine, or as a reminder to make a call, or as a reminder to perform another task, etc.  9. Use a small tape recorder to record important information and notes for yourself.

## 2018-04-28 NOTE — Therapy (Signed)
[facility] [address] [place], [name], [address] Phone: [phone]   Fax:  [phone]  Physical Therapy Evaluation  Patient Details  Name: Jacqueline Atkinson MRN: [id number] Date of Birth: 1956-07-02 Referring Provider: Dr. Wells Guiles Tat   Encounter Date: 04/28/2018  PT End of Session - 04/28/18 2224    Visit Number  1    Number of Visits  17    Date for PT Re-Evaluation  07/27/18    Authorization Type  [employer] Medicare VL:  [place] (progress note needed every 10th visit)    Authorization Time Period  PT cert 12/30/60-5/68/12    PT Start Time  1105    PT Stop Time  1146    PT Time Calculation (min)  41 min    Activity Tolerance  Patient tolerated treatment well    Behavior During Therapy  [facility] for tasks assessed/performed       Past Medical History:  Diagnosis Date  . Anemia    hx of  . Anxiety   . Arthritis    Left knee  osteoarthritis  . GERD (gastroesophageal reflux disease)   . Heart murmur   . History of hiatal hernia    small  . Hx of seasonal allergies    occ. use OTC Mucinex or Antihistamine.  . Hyperlipidemia   . Hypertension   . Neuromuscular disorder ([place])    "multi system atrophy disease"- "pain, bilateral foot numbness, left knee pain"- Dr. Carles Collet is following.  . Ovarian cancer on left ([place]) 2019   serous borderline s/p BSO  . Pneumonia   . 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 ABDOMINAL HYSTERECTOMY     04-02-18 Dr. Gerarda Fraction  . DILATION AND CURETTAGE, DIAGNOSTIC / THERAPEUTIC     1981 and 1998 due to missed AB  . ESOPHAGOGASTRODUODENOSCOPY ENDOSCOPY     with esophageal dilation  . FOOT SURGERY  2011   3rd metatarsal LT foot  . KNEE ARTHROSCOPY Left 02/08/2015   Procedure: LEFT KNEE ARTHROSCOPY ;  Surgeon: Latanya Maudlin, MD;  [facility]: [facility];  Service: Orthopedics;   Laterality: Left;  . LAPAROTOMY N/A 04/02/2018   Procedure: EXPLORATORY LAPAROTOMY;  Surgeon: Isabel Caprice, MD;  [facility]: [facility];  Service: Gynecology;  Laterality: N/A;  . SALPINGOOPHORECTOMY Bilateral 04/02/2018   Procedure: BILATERAL SALPINGO OOPHORECTOMY;  Surgeon: Isabel Caprice, MD;  [facility];  Service: Gynecology;  Laterality: Bilateral;  . TONSILLECTOMY AND ADENOIDECTOMY    . TOTAL KNEE ARTHROPLASTY Left 08/22/2015   Procedure: LEFT TOTAL  KNEE  ARTHROPLASTY;  Surgeon: Latanya Maudlin, MD;  [facility];  Service: Orthopedics;  Laterality: Left;    There were no vitals filed for this visit.   Subjective Assessment - 04/28/18 1108    Subjective  [facility]ed out at [facility], but then started having back pain>abdominal pain, then mass removed.  Had HHPT following surgery.  Some days my balance is very off; had 4 days very off balance and difficulty moving, then yesterday was a good day.  I know when I get tired or unsteady, I lean too much on my walker, try to stop and correct.  Have had a couple of falls.    Pertinent History  surgery to remove abdominal mass 04/02/18, Hx of MSA; knee arthritis    Patient Stated Goals  Wants to work on balance  and stamina.    Currently in Pain?  No/denies         [facility] PT Assessment - 04/28/18 1117      Assessment   Medical Diagnosis  MSA    Referring Provider  Tat, Wells Guiles    Prior Therapy  -- ended 09/2017      Precautions   Precautions  Fall      Balance Screen   Has the patient fallen in the past 6 months  Yes    How many times?  2    Has the patient had a decrease in activity level because of a fear of falling?   Yes    Is the patient reluctant to leave their home because of a fear of falling?   No "it's just so hard to leave the house"      [place]    Available Help at Discharge  Family    Type of [place] to enter  Trying to get ramp    Entrance Stairs-Number of Steps  3    Entrance Stairs-Rails  Right    Home Layout  One level    [place] - 2 wheels;Walker - 4 wheels;Electric scooter;Tub bench;Shower seat;Grab bars - tub/shower U-step RW      Prior Function   Level of Independence  Independent with basic ADLs;Independent with household mobility with device;Independent with community mobility with device    Leisure  Was going to [facility] prior to abdominal surgery; performs balance exercise from HHPT; would like to get back to water activities      Observation/Other Assessments   Focus on Therapeutic Outcomes (FOTO)   NA      Posture/Postural Control   Posture/Postural Control  Postural limitations    Postural Limitations  Forward head;Flexed trunk      Transfers   Transfers  Sit to Stand;Stand to Sit    Sit to Stand  6: Modified independent (Device/Increase time);With upper extremity assist;From chair/3-in-1 Specific leg placement due to L knee pain    Stand to Sit  6: Modified independent (Device/Increase time);With upper extremity assist;To chair/3-in-1    Comments  RLE straight, and L knee bent and more posterior      Ambulation/Gait   Ambulation/Gait  Yes    Ambulation/Gait Assistance  5: Supervision    Ambulation/Gait Assistance Details  More forward flexed posture with increased walking distance    Ambulation Distance (Feet)  300 Feet    Assistive device  -- U-step RW    Gait Pattern  Step-through pattern;Trunk flexed;Decreased dorsiflexion - right;Decreased dorsiflexion - left    Ambulation Surface  Level;Indoor    Gait velocity  23.97 sec = 1.37 ft/sec    Gait Comments  3 minute walk test using U-step RW:  265 ft (decreased from 417 ft at d/c 09/2017)      Standardized Balance Assessment   Standardized Balance Assessment  Timed Up and Go Test      Timed Up and Go Test   Normal TUG (seconds)  36.53    Cognitive TUG (seconds)  36.53    TUG Comments  Scores >13.5 sec  indicate increased fall risk; >30 seconds indicates increased difficulty with ADLs in home. (At d/c 09/2017:  TUG score was 22.28 sec)                Objective  measurements completed on examination: See above findings.                PT Short Term Goals - 04/28/18 2236      PT SHORT TERM GOAL #1   Title  Pt will be independent with HEP for improved transfers, gait and balance.  TARGET 05/29/18    Time  4    Period  Weeks    Status  New    Target Date  05/29/18      PT SHORT TERM GOAL #2   Title  Pt will improve TUG score to less than or equal to 30 seconds for decreased difficulty with ADLs at home, decreased fall risk.    Time  4    Period  Weeks    Status  New    Target Date  05/29/18      PT SHORT TERM GOAL #3   Title  Pt will improve gait velocity to at least 1.8 ft/sec for improved gait efficiency and safety.    Time  4    Period  Weeks    Status  New    Target Date  05/29/18      PT SHORT TERM GOAL #4   Title  Pt will verbalize understanding of fall prevention in the home environment.    Time  4    Period  Weeks    Status  New    Target Date  05/29/18        PT Long Term Goals - 04/28/18 2238      PT LONG TERM GOAL #1   Title  Pt will be independent with progression of HEP to address posture, balance, transfers and gait.  TARGET 10/17/17    Time  8    Period  Weeks    Status  New    Target Date  06/26/18      PT LONG TERM GOAL #2   Title  Pt will improve TUG cognitive score to less than or equal to 25 seconds for decreased fall risk.    Time  8    Period  Weeks    Status  New    Target Date  06/26/18      PT LONG TERM GOAL #3   Title  Pt will improve gait velocity to at least 2 ft/sec for improved gait efficiency and safety.    Time  8    Period  Weeks    Status  New    Target Date  06/26/18      PT LONG TERM GOAL #4   Title  Pt will improve 3 minute walk test by 50 ft for improved efficiency and safety with gait.    Time  8     Period  Weeks    Status  New    Target Date  06/26/18      PT LONG TERM GOAL #5   Title  Pt will verbalize plans for ongoing community fitness upon D/C from PT, including possibilty of aquatic therapy if appropriate.    Time  8    Period  Weeks    Status  New    Target Date  06/26/18             Plan - 04/28/18 2225    Clinical Impression Statement  Pt is a 62 year old female with history of MSA, known to this therapist from previous bout of therapy ending in 09/2017.  Since discharge, she had had  surgery to remove pelvic mass and has has HHPT.  While she feels she has recovered from her surgery, she no longer feels comfortable performing previous PT HEP, and she has had 2 falls.  She presents with decreased balance, decreased funcitonal lower extremity strength, abnormal posture, decreased endurance, decreased gait independence and safety.  Pt's gait velocity has slowed from discharge (2.26 ft/sec>1.37 ft/sec), TUG cog score slowed (22.28 sec>36.53 sec), and distance in 3 minute walk test has slowed (417 ft>265 ft).  Pt will benefit from skilled physical therapy to address the above stated deficits for improved functional mobility and decreased fall risk.    History and Personal Factors relevant to plan of care:  Hx of MSA, recent surgery to remove pelvic mass, knee OA; slowed mobiltiy measures from previous bout of PT ending 09/2017    Clinical Presentation  Evolving    Clinical Presentation due to:  at fall risk per gait velocity, TUG; hx of 2 recent falls    Clinical Decision Making  Moderate    Rehab Potential  Good    PT Frequency  2x / week    PT Duration  8 weeks    PT Treatment/Interventions  ADLs/Self Care Home Management;Aquatic Therapy;Gait training;Stair training;Functional mobility training;Therapeutic activities;Therapeutic exercise;Balance training;Patient/family education;Neuromuscular re-education    PT Next Visit Plan  [place]view standing balance HEP (from HHPT and from  previous bout of PT ending 09/2017) and update as needed; gait activities and walking program for home; possibility of aquatic therapy for strength, balance exercises        Patient will benefit from skilled therapeutic intervention in order to improve the following deficits and impairments:  Abnormal gait, Decreased activity tolerance, Decreased balance, Decreased endurance, Decreased mobility, Decreased strength, Difficulty walking, Postural dysfunction  Visit Diagnosis: Unsteadiness on feet  Other abnormalities of gait and mobility  Abnormal posture  Other symptoms and signs involving the nervous system     Problem List Patient Active Problem List   Diagnosis Date Noted  . Intra-abdominal and pelvic swelling, mass and lump, unspecified site 04/02/2018  . Pelvic mass 03/14/2018  . Dyspnea on exertion 08/05/2017  . Hyperlipidemia 04/23/2016  . Arthritis 04/23/2016  . 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 Postoperative anemia due to acute blood loss 08/29/2015  . 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 Gait difficulty 12/29/2013  . Back pain 12/29/2013  . Paresthesia 12/19/2012  . Essential hypertension 11/28/2012  . Polypharmacy 11/28/2012  . GERD (gastroesophageal reflux disease) 11/28/2012    Gizel Riedlinger W. 04/28/2018, 10:45 PM  Frazier Butt., PT   [facility] [address] [place], [name], [address] Phone: [phone]   Fax:  [phone]  Name: KYLIEANN EAGLES MRN: [id number] Date of Birth: [birth year]

## 2018-04-28 NOTE — Therapy (Signed)
[facility] [address] [place], [name], [address] Phone: [phone]   Fax:  [phone]  Occupational Therapy Evaluation  Patient Details  Name: Jacqueline Atkinson MRN: [id number] Date of Birth: [birth year] Referring Provider: Dr. Wells Guiles Tat   Encounter Date: 04/28/2018  OT End of Session - 04/28/18 1937    Visit Number  1    Number of Visits  17    Date for OT Re-Evaluation  06/27/18    Authorization Type  [employer] Medicare    Authorization Time Period  cert. 04/28/18-07/27/18    Authorization - Visit Number  1    Authorization - Number of Visits  10    OT Start Time  1150    OT Stop Time  6222    OT Time Calculation (min)  45 min    Activity Tolerance  Patient tolerated treatment well    Behavior During Therapy  WFL for tasks assessed/performed       Past Medical History:  Diagnosis Date  . Anemia    hx of  . Anxiety   . Arthritis    Left knee  osteoarthritis  . GERD (gastroesophageal reflux disease)   . Heart murmur   . History of hiatal hernia    small  . Hx of seasonal allergies    occ. use OTC Mucinex or Antihistamine.  . Hyperlipidemia   . Hypertension   . Neuromuscular disorder ([place])    "multi system atrophy disease"- "pain, bilateral foot numbness, left knee pain"- Dr. Carles Collet is following.  . Ovarian cancer on left ([place]) 2019   serous borderline s/p BSO  . Pneumonia   . 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 ABDOMINAL HYSTERECTOMY     04-02-18 Dr. Gerarda Fraction  . DILATION AND CURETTAGE, DIAGNOSTIC / THERAPEUTIC     1981 and 1998 due to missed AB  . ESOPHAGOGASTRODUODENOSCOPY ENDOSCOPY     with esophageal dilation  . FOOT SURGERY  2011   3rd metatarsal LT foot  . KNEE ARTHROSCOPY Left 02/08/2015   Procedure: LEFT KNEE ARTHROSCOPY ;  Surgeon: Latanya Maudlin, MD;  [facility];   Service: Orthopedics;  Laterality: Left;  . LAPAROTOMY N/A 04/02/2018   Procedure: EXPLORATORY LAPAROTOMY;  Surgeon: Isabel Caprice, MD;  [facility]: [facility];  Service: Gynecology;  Laterality: N/A;  . SALPINGOOPHORECTOMY Bilateral 04/02/2018   Procedure: BILATERAL SALPINGO OOPHORECTOMY;  Surgeon: Isabel Caprice, MD;  [facility];  Service: Gynecology;  Laterality: Bilateral;  . TONSILLECTOMY AND ADENOIDECTOMY    . TOTAL KNEE ARTHROPLASTY Left 08/22/2015   Procedure: LEFT TOTAL  KNEE  ARTHROPLASTY;  Surgeon: Latanya Maudlin, MD;  [facility];  Service: Orthopedics;  Laterality: Left;    There were no vitals filed for this visit.  Subjective Assessment - 04/28/18 1154    Patient is accompained by:  Family member daughter    Pertinent History  MSA (multiple systems atrophy; L knee replacement 2016; HTN; migraines; dyspnea on exertion; hyperlipidemia, pelvic mass (surgery 04/02/18); arthritis; GERD; HTN    Limitations  no swimming/heavy lifting until cleared by surgeon due to pelvic mass removed 04/02/18; (appt 7/12), fall risk    Patient Stated Goals  return to the pool, improve writing, improve coordination/R hand use, improve R shoulder rigidity    Currently in Pain?  No/denies  [facility] OT Assessment - 04/28/18 1155      Assessment   Medical Diagnosis  MSA (multiple systems atrophy)    Referring Provider  Dr. Wells Guiles Tat    Hand Dominance  Right    Prior Therapy  last OT 09/2017      Precautions   Precautions  Fall      Balance Screen   Has the patient fallen in the past 6 months  Yes    How many times?  a couple adjusting pillows, falls back and to the [place] expects to be discharged to:  Private residence    Lives With  Family 2 daughters and son-in-law      Prior Function   Level of Independence  Independent with basic ADLs;Independent with household mobility with device;Independent with community mobility with device    Vocation   On disability    Leisure  enjoys painting (hasn't done lately), writing poems, cannot use stationary bike due to L knee pain, has not been exercising in water recently not been performing OT HEP lately      ADL   Eating/Feeding  -- difficulty cutting/cutting with fork    Eating/Feeding Patient Percentage  -- difficulty getting food in mouth/turning utensil    Where assessed - Eating/feeding  -- switches to LUE at times    Grooming  -- stopped using make-up, electric toothbrush    Grooming Patient Percentage  -- able to brush hair/difficulty putting in ponytail    Upper Body Bathing  Modified independent    Lower Body Bathing  Modified independent    Upper Body Dressing  Increased time pull-over, front closure bra    Lower Body Dressing  Increased time difficulty    Toilet Transfer  Modified independent    Toilet Transer Equipment  Raised toilet seat with arms (or 3-in-1over toilet)    Toileting - Clothing Manipulation  Increased time wears mostly dresses    Toileting -  Horticulturist, commercial bars;Shower seat with back;Walk in shower hand-held shower head    Transfers/Ambulation Related to ADL's  lift chair that she uses some. difficulty sit>stand. difficulty getting in passenger side of car.  Uses hospital bed and able to get in/out.      IADL   Prior Level of Function Shopping  has scooter    Prior Level of Function Light Housekeeping  family primarily performing now    Light Housekeeping  -- loads dishwasher, unable to unload    Meal Prep  Able to complete simple cold meal and snack prep family primarily performs    Investment banker, corporate own vehicle limited, difficulty making turns with UE rigidity    Medication Management  -- difficulty picking up pills      Mobility   Mobility Status  History of falls;Freezing    Mobility Status Comments  Uses U-step walker, freezing with R foot with  direction changes (in kitchen), difficulty opening doors, difficulty taking a step backwards       Written Expression   Dominant Hand  Right    Handwriting  100% legible;Increased time for 2 sentences, incr effort, decr legibility if writes more      Vision - History   Additional Comments  Pt reports cataracts, 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      Activity  Tolerance   Activity Tolerance Comments  fatigues quickly      Cognition   Overall Cognitive Status  Impaired/Different from baseline Cognition to be assessed further in functional context prn    Area of Impairment  Attention;Memory    Memory  Decreased short-term memory    Cognition Comments  Speech therapy plans to further assess cognition and address further      Observation/Other Assessments   Other Surveys   Select    Physical Performance Test    Yes    Simulated Eating Time (seconds)  12.25    Donning Doffing Jacket Time (seconds)  15.77      Posture/Postural Control   Posture/Postural Control  Postural limitations    Postural Limitations  Forward head;Flexed trunk      Sensation   Additional Comments  Pt reports numbness in feet "feel like concrete blocks"      Coordination   9 Hole Peg Test  Right;Left    Right 9 Hole Peg Test  25.87    Left 9 Hole Peg Test  23.78    Box and Blocks  R-47blocks, L-60blocks      Tone   Assessment Location  Right Upper Extremity;Left Upper Extremity      ROM / Strength   AROM / PROM / Strength  AROM      AROM   Overall AROM   Deficits    Overall AROM Comments  LUE grossly WFL; RUE with 140* shoulder flex with min v.c. for elbow ext (150* LUE) and decr R supination      RUE Tone   RUE Tone  Moderate      LUE Tone   LUE Tone  Mild                      OT Education - 04/28/18 1914    Education Details  Concerns with safety with driving (vision changes, decr reaction time, fatigue, and rigidity for UE for turns and LE for  pedals); OT POC and eval results    Person(s) Educated  Patient;Child(ren)    Methods  Explanation    Comprehension  Verbalized understanding       OT Short Term Goals - 04/28/18 2029      OT SHORT TERM GOAL #1   Title  Pt will be independent with updated HEP.--check 05/28/18    Time  4    Period  Weeks    Status  New      OT SHORT TERM GOAL #2   Title  Pt will verbalize understanding of appropriate community fitness opportunities (pool exercises, etc) and PD-related resources.    Time  4    Period  Weeks    Status  New        OT Long Term Goals - 04/28/18 2030      OT LONG TERM GOAL #1   Title  Pt will be verbalize understanding of adaptive strategies/AE for ADLs/IADLs to incr safety and ease.--check LTGs 07/27/18    Time  8    Period  Weeks    Status  New      OT LONG TERM GOAL #2   Title  Pt will improve dominant RUE functional reaching and coordination as shown by improving score on box and blocks test by at least 5.    Baseline  R-47, L-60 blocks    Time  8    Period  Weeks    Status  New  OT LONG TERM GOAL #3   Title  Pt will demo at least 145* R shoulder flexion for improved RUE reaching/rigidity.    Baseline  R-140*, L-150*    Time  8    Period  Weeks    Status  New      OT LONG TERM GOAL #4   Title  Pt will be able to write at least 1/2 page with good legibility for leisure tasks.    Time  8    Period  Weeks    Status  New      OT LONG TERM GOAL #5   Title  --    Time  --    Period  --    Status  --            Plan - 04/28/18 1938    Clinical Impression Statement  Pt is a 62 y.o. female with diagnosis of MSA.  Pt returns to occupational therapy due to incr difficulty with ADLs.  PMH includes: L knee replacement 2016; HTN; migraines; dyspnea on exertion; hyperlipidemia, pelvic mass; arthritis; GERD; HTN.   Pt presents with bradykinesia, rigidity, decr coordination, abnormal posture, decr functional mobility/balance affecting ADLs/IADLs.  Pt  would benefit from occupational therapy to address these deficits for incr safety, ease with ADLs/IADLs, improved UE functional use, to prevent future complications.    Occupational Profile and client history currently impacting functional performance  Pt is mod I with BADLs, but is slowly decreasing participation in IADLs due to difficulty.  Pt also reports incr difficulty with BADLs and difficulty/decline in leisure activities affecting quality of life.    Occupational performance deficits (Please refer to evaluation for details):  ADL's;IADL's;Social Participation;Leisure    Rehab Potential  Fair    OT Frequency  1x / week 2x/week if/when pt can do aquatic therapy (1x/wk in pool +1x/wk in clinic)    OT Duration  8 weeks    OT Treatment/Interventions  Aquatic Therapy;Self-care/ADL training;Cryotherapy;Paraffin;Therapeutic exercise;DME and/or AE instruction;Functional Mobility Training;Cognitive remediation/compensation;Balance training;Visual/perceptual remediation/compensation;Manual Therapy;Neuromuscular education;Fluidtherapy;Moist Heat;Ultrasound;Energy conservation;Passive range of motion;Therapeutic activities;Patient/family education    Plan  review/update HEP (supine PWR, PWR! hands)    Clinical Decision Making  Several treatment options, min-mod task modification necessary    Consulted and Agree with Plan of Care  Patient       Patient will benefit from skilled therapeutic intervention in order to improve the following deficits and impairments:  Decreased balance, Decreased endurance, Decreased mobility, Impaired sensation, Impaired vision/preception, Impaired tone, Decreased range of motion, Decreased cognition, Decreased activity tolerance, Decreased coordination, Decreased knowledge of use of DME, Impaired UE functional use, Improper spinal/pelvic alignment, Decreased strength, Impaired flexibility, Decreased safety awareness  Visit Diagnosis: Other symptoms and signs involving the  nervous system  Other symptoms and signs involving the musculoskeletal system  Other lack of coordination  Stiffness of right shoulder, not elsewhere classified  Abnormal posture  Other abnormalities of gait and mobility  Unsteadiness on feet    Problem List Patient Active Problem List   Diagnosis Date Noted  . Intra-abdominal and pelvic swelling, mass and lump, unspecified site 04/02/2018  . Pelvic mass 03/14/2018  . Dyspnea on exertion 08/05/2017  . Hyperlipidemia 04/23/2016  . Arthritis 04/23/2016  . 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 Postoperative anemia due to acute blood loss 08/29/2015  . 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 Gait difficulty 12/29/2013  . Back pain 12/29/2013  . Paresthesia 12/19/2012  . Essential hypertension 11/28/2012  .  Polypharmacy 11/28/2012  . GERD (gastroesophageal reflux disease) 11/28/2012    [facility] 04/28/2018, 8:39 PM  [facility] [address] [place], [name], [address] Phone: [phone]   Fax:  [phone]  Name: Jacqueline Atkinson MRN: [id number] Date of Birth: [birth year]   Vianne Bulls, OTR/L [facility] [address] [place], [place]  [address] [phone] phone [phone] 04/28/18 8:41 PM

## 2018-04-28 NOTE — Therapy (Signed)
[facility] [address] [place], [name], [address] Phone: [phone]   Fax:  [phone]  Speech Language Pathology Evaluation  Patient Details  Name: Jacqueline Atkinson MRN: [id number] Date of Birth: Feb 09, 1956 Referring Provider: Dr. Wells Guiles Tat   Encounter Date: 04/28/2018  End of Session - 04/28/18 1121    Visit Number  1    Number of Visits  17    Date for SLP Re-Evaluation  06/26/18    SLP Start Time  1029    SLP Stop Time   1100    SLP Time Calculation (min)  31 min    Activity Tolerance  Patient tolerated treatment well       Past Medical History:  Diagnosis Date  . Anemia    hx of  . Anxiety   . Arthritis    Left knee  osteoarthritis  . GERD (gastroesophageal reflux disease)   . Heart murmur   . History of hiatal hernia    small  . Hx of seasonal allergies    occ. use OTC Mucinex or Antihistamine.  . Hyperlipidemia   . Hypertension   . Neuromuscular disorder ([place])    "multi system atrophy disease"- "pain, bilateral foot numbness, left knee pain"- Dr. Carles Collet is following.  . Ovarian cancer on left ([place]) 2019   serous borderline s/p BSO  . Pneumonia   . 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 ABDOMINAL HYSTERECTOMY     04-02-18 Dr. Gerarda Fraction  . DILATION AND CURETTAGE, DIAGNOSTIC / THERAPEUTIC     1981 and 1998 due to missed AB  . ESOPHAGOGASTRODUODENOSCOPY ENDOSCOPY     with esophageal dilation  . FOOT SURGERY  2011   3rd metatarsal LT foot  . KNEE ARTHROSCOPY Left 02/08/2015   Procedure: LEFT KNEE ARTHROSCOPY ;  Surgeon: Latanya Maudlin, MD;  Location: [facility];  Service: Orthopedics;  Laterality: Left;  . LAPAROTOMY N/A 04/02/2018   Procedure: EXPLORATORY LAPAROTOMY;  Surgeon: Isabel Caprice, MD;  Location: [facility];  Service: Gynecology;  Laterality: N/A;  . SALPINGOOPHORECTOMY  Bilateral 04/02/2018   Procedure: BILATERAL SALPINGO OOPHORECTOMY;  Surgeon: Isabel Caprice, MD;  Location: [facility];  Service: Gynecology;  Laterality: Bilateral;  . TONSILLECTOMY AND ADENOIDECTOMY    . TOTAL KNEE ARTHROPLASTY Left 08/22/2015   Procedure: LEFT TOTAL  KNEE  ARTHROPLASTY;  Surgeon: Latanya Maudlin, MD;  Location: [facility];  Service: Orthopedics;  Laterality: Left;    There were no vitals filed for this visit.  Subjective Assessment - 04/28/18 1106    Subjective  "I don't know if it's memory or I can't find words" Pt arrived 15 minutes late for ST evaluation         SLP Evaluation [facility] - 04/28/18 1033      SLP Visit Information   SLP Received On  04/28/18    Referring Provider  Dr. Wells Guiles Tat    Onset Date  approx 2015    Medical Diagnosis  MultiSystem Atrophy C MCA      Subjective   Patient/Family Stated Goal  "I would like to have my thesaurus in my head back and my mouth to work"      General Information   HPI  Pt know to PT/OT for prior courses of therapy for MSA.  She receives Botox for hemifacial spasm.  She reports that it  is getting better.  She is still on carbidopa/levodopa 25/100, 1 tablet 6 times per day and carbidopa/levodopa 50/200 at night.  At her request, she was referred to gynecology after our last visit.  She had a normal CA-125 and she had cysts on the ultrasound.  She subsequently had an MRI and this was noted.  She has a large multilobulated cystic mass on the right ovary.  Pt reports word finding difficluties throughout the day, reduced memory and attention for about 9 moths, (snice 07/2017) Pt and daughter report it is getting worse. MBSS 11/19/17 revealed oropharyngeal swallow WFL. Pt reports h/o esophageal stricture with dialation and xerostomia due to meds vs botox    Behavioral/Cognition  Pt driving very little. Daughter reports family is encouraging her not to drive due to physical limitations and slower reaction time    Mobility Status  walks  with walker      Balance Screen   Has the patient fallen in the past 6 months  Yes    How many times?  a couple    Has the patient had a decrease in activity level because of a fear of falling?   Yes    Is the patient reluctant to leave their home because of a fear of falling?   Yes      Prior Functional Status   Cognitive/Linguistic Baseline  Baseline deficits    Baseline deficit details  mild memory    Type of Home  House     Lives With  Family;Son;Daughter    Available Support  Family    Vocation  On disability      Cognition   Overall Cognitive Status  Impaired/Different from baseline    Area of Impairment  Attention;Memory;Problem solving    Current Attention Level  Selective    Memory  Decreased short-term memory    Problem Solving  Difficulty sequencing;Slow processing    Problem Solving  Impaired    Problem Solving Impairment  Verbal complex;Functional complex    Animator Comprehension   Overall Auditory Comprehension  Appears within functional limits for tasks assessed      Reading Comprehension   Reading Status  Not tested Pt reports reduced vision affecting reading at times      Expression   Primary Mode of Expression  Verbal      Verbal Expression   Overall Verbal Expression  Impaired    Initiation  No impairment    Automatic Speech  -- WFL    Repetition  No impairment    Naming  Impairment    Responsive  76-100% accurate    Confrontation  75-100% accurate    Convergent  75-100% accurate    Divergent  75-100% accurate    Verbal Errors  Semantic paraphasias;Aware of errors    Pragmatics  No impairment    Interfering Components  Attention    Effective Techniques  Phonemic cues      Written Expression   Dominant Hand  Right    Written Expression  Not tested      Oral Motor/Sensory Function   Overall Oral Motor/Sensory Function  Impaired    Labial ROM  Reduced right    Labial Symmetry  Abnormal symmetry right     Labial Strength  Reduced Right    Labial Sensation  Within Functional Limits    Labial Coordination  WFL    Lingual ROM  Within Functional Limits    Lingual Symmetry  Within Functional Limits    Lingual Strength  Within Functional Limits    Lingual Sensation  Within Functional Limits    Lingual Coordination  WFL    Facial ROM  Reduced right    Facial Symmetry  Right droop    Velum  Within Functional Limits ? some right droop - does not affect speech or swallowoing    Mandible  Within Functional Limits      Motor Speech   Overall Motor Speech  Impaired    Respiration  Within functional limits    Phonation  Normal    Resonance  Within functional limits    Articulation  Impaired    Level of Impairment  Conversation    Intelligibility  Intelligibility reduced    Word  75-100% accurate    Phrase  75-100% accurate    Sentence  75-100% accurate    Conversation  75-100% accurate    Motor Planning  Witnin functional limits    Motor Speech Errors  Aware    Effective Techniques  Slow rate;Over-articulate      Standardized Assessments   Standardized Assessments   [place] Naming Test-2nd edition    Boston Naming Test-2nd edition   57/60 - WNL                      SLP Education - 04/28/18 1121    Education Details  compensations for dysnomia; compensations for memory; goals for ST; need for cognitive assessment next visit    Person(s) Educated  Patient;Child(ren)    Methods  Explanation;Demonstration;Handout    Comprehension  Verbalized understanding;Need further instruction       SLP Short Term Goals - 04/28/18 1132      SLP SHORT TERM GOAL #1   Title  Pt will complete complex sequential naming and naming with parameters 8/10x with occasional min A    Time  4    Period  Weeks    Status  New      SLP SHORT TERM GOAL #2   Title  Pt will complete formal cognitive linguistic assessment and written assessment within 1st 3 visits    Time  2    Period  Weeks    Status   New      SLP SHORT TERM GOAL #4   Title  Pt will use external aids for medications and financial management with occasional min A over 2 sessions    Time  4    Period  Weeks    Status  New      SLP SHORT TERM GOAL #5   Title  Pt will perform moderately complex functional reasoning, problem solving, organizing tasks with 90% accuracy and occasional min A over 2 sessions    Time  4    Status  New       SLP Long Term Goals - 04/28/18 1134      SLP LONG TERM GOAL #1   Title  Pt will utilize compensatoins for dysnomia over 20 minute complex conversation for 3 episodes of dysnomia with rare min A    Time  8    Period  Weeks    Status  New      SLP LONG TERM GOAL #2   Title  Pt will alternate attention between 2 mildly complex cognitive linguistic tasks with 85% on each and occasional min A    Time  8    Period  Weeks    Status  New  SLP LONG TERM GOAL #3   Title  Pt will utilize compensations for dysarthria as needed during 10 minute conversation over 2 sessions with rare min A    Time  8    Period  Weeks    Status  New      SLP LONG TERM GOAL #4   Title  Pt will ID errors in cognitive linguistic tasks 3/5x with rare min A over 2 sessions.     Time  8    Period  Weeks    Status  New       Plan - 04/28/18 1141    Clinical Impression Statement  Ms. Lippard, a 62 y.o. female with h/o of MSA is referred for outpt ST due to c/o of word finding difficulties and memory impairment, as well as right facial weakness (She receives Botox for facial spasms per her report). She reports word finding difficulties began about 6 months ago and have gotten worse. Pt verbalizes frustration when she can't think of a word, which happens througout the day. 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 Ms. Fedele reports slurred speech which gets worse at night. Today, she is 100% intellgible in this quiet office with minimal slur/dysarthria Rapic  alternating speech is [facility]. Her daughter reports semantic paraphasias. Today, Ms. Whaling scored a 57/60 on the BNT-2 which is WNL. She named 15 words that begin with "f" in 1 minute, also WNL. Ms. Smelcer reports loosing items, some difficulty managing meds and finances. When asked, she affirms her attention has declined. I recommend skillled ST to maximize verbal expression, cognition for improved independence, safety and to reduce caregiver burden Due to pt arriving late, will f/u with formal cognitive testing next few therapy visits.  Goals may be modified based on formal cognitive assessment       Patient will benefit from skilled therapeutic intervention in order to improve the following deficits and impairments:   Cognitive communication deficit - Plan: SLP plan of care cert/re-cert  Aphasia - Plan: SLP plan of care cert/re-cert    Problem List Patient Active Problem List   Diagnosis Date Noted  . Intra-abdominal and pelvic swelling, mass and lump, unspecified site 04/02/2018  . Pelvic mass 03/14/2018  . Dyspnea on exertion 08/05/2017  . Hyperlipidemia 04/23/2016  . Arthritis 04/23/2016  . 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 Postoperative anemia due to acute blood loss 08/29/2015  . 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 Gait difficulty 12/29/2013  . Back pain 12/29/2013  . Paresthesia 12/19/2012  . Essential hypertension 11/28/2012  . Polypharmacy 11/28/2012  . GERD (gastroesophageal reflux disease) 11/28/2012    Nanna Ertle, Annye Rusk MS, CCC-SLP 04/28/2018, 11:42 AM  [facility] [address] [place], [name], [address] Phone: [phone]   Fax:  [phone]  Name: Jacqueline Atkinson MRN: [id number] Date of Birth: June 08, 1956

## 2018-05-04 ENCOUNTER — Encounter (HOSPITAL_COMMUNITY): Payer: Self-pay

## 2018-05-04 ENCOUNTER — Ambulatory Visit (HOSPITAL_BASED_OUTPATIENT_CLINIC_OR_DEPARTMENT_OTHER): Admission: RE | Admit: 2018-05-04 | Payer: Medicare Other | Source: Ambulatory Visit | Admitting: Urology

## 2018-05-04 ENCOUNTER — Telehealth: Payer: Self-pay | Admitting: *Deleted

## 2018-05-04 ENCOUNTER — Other Ambulatory Visit: Payer: Self-pay | Admitting: Obstetrics

## 2018-05-04 ENCOUNTER — Observation Stay (HOSPITAL_COMMUNITY)
Admission: EM | Admit: 2018-05-04 | Discharge: 2018-05-05 | Disposition: A | Discharge status: Home / Self Care | Payer: Medicare Other | Attending: Obstetrics | Admitting: Obstetrics

## 2018-05-04 ENCOUNTER — Encounter (HOSPITAL_COMMUNITY)
Admission: EM | Disposition: A | Discharge status: Home / Self Care | Payer: Self-pay | Source: Home / Self Care | Attending: Emergency Medicine

## 2018-05-04 ENCOUNTER — Emergency Department (HOSPITAL_COMMUNITY): Payer: Medicare Other

## 2018-05-04 ENCOUNTER — Ambulatory Visit: Payer: Medicare Other | Admitting: Obstetrics

## 2018-05-04 ENCOUNTER — Other Ambulatory Visit: Payer: Self-pay | Admitting: Urology

## 2018-05-04 ENCOUNTER — Emergency Department (HOSPITAL_BASED_OUTPATIENT_CLINIC_OR_DEPARTMENT_OTHER): Payer: Medicare Other | Admitting: Anesthesiology

## 2018-05-04 ENCOUNTER — Other Ambulatory Visit: Payer: Self-pay

## 2018-05-04 DIAGNOSIS — N132 Hydronephrosis with renal and ureteral calculous obstruction: Secondary | ICD-10-CM | POA: Diagnosis not present

## 2018-05-04 DIAGNOSIS — D649 Anemia, unspecified: Secondary | ICD-10-CM | POA: Diagnosis not present

## 2018-05-04 DIAGNOSIS — Z96652 Presence of left artificial knee joint: Secondary | ICD-10-CM | POA: Diagnosis not present

## 2018-05-04 DIAGNOSIS — N136 Pyonephrosis: Principal | ICD-10-CM | POA: Insufficient documentation

## 2018-05-04 DIAGNOSIS — N1330 Unspecified hydronephrosis: Secondary | ICD-10-CM | POA: Diagnosis not present

## 2018-05-04 DIAGNOSIS — R1904 Left lower quadrant abdominal swelling, mass and lump: Secondary | ICD-10-CM

## 2018-05-04 DIAGNOSIS — Z79899 Other long term (current) drug therapy: Secondary | ICD-10-CM | POA: Diagnosis not present

## 2018-05-04 DIAGNOSIS — F419 Anxiety disorder, unspecified: Secondary | ICD-10-CM | POA: Insufficient documentation

## 2018-05-04 DIAGNOSIS — Z6838 Body mass index (BMI) 38.0-38.9, adult: Secondary | ICD-10-CM | POA: Diagnosis not present

## 2018-05-04 DIAGNOSIS — I10 Essential (primary) hypertension: Secondary | ICD-10-CM | POA: Diagnosis not present

## 2018-05-04 DIAGNOSIS — R1032 Left lower quadrant pain: Secondary | ICD-10-CM | POA: Diagnosis not present

## 2018-05-04 DIAGNOSIS — R109 Unspecified abdominal pain: Secondary | ICD-10-CM

## 2018-05-04 DIAGNOSIS — N135 Crossing vessel and stricture of ureter without hydronephrosis: Secondary | ICD-10-CM | POA: Diagnosis not present

## 2018-05-04 DIAGNOSIS — Z8543 Personal history of malignant neoplasm of ovary: Secondary | ICD-10-CM | POA: Diagnosis not present

## 2018-05-04 DIAGNOSIS — E785 Hyperlipidemia, unspecified: Secondary | ICD-10-CM | POA: Diagnosis not present

## 2018-05-04 DIAGNOSIS — Z882 Allergy status to sulfonamides status: Secondary | ICD-10-CM | POA: Diagnosis not present

## 2018-05-04 DIAGNOSIS — K219 Gastro-esophageal reflux disease without esophagitis: Secondary | ICD-10-CM | POA: Insufficient documentation

## 2018-05-04 DIAGNOSIS — Z90722 Acquired absence of ovaries, bilateral: Secondary | ICD-10-CM | POA: Diagnosis not present

## 2018-05-04 DIAGNOSIS — Z87891 Personal history of nicotine dependence: Secondary | ICD-10-CM | POA: Insufficient documentation

## 2018-05-04 DIAGNOSIS — E669 Obesity, unspecified: Secondary | ICD-10-CM | POA: Insufficient documentation

## 2018-05-04 DIAGNOSIS — Z9079 Acquired absence of other genital organ(s): Secondary | ICD-10-CM | POA: Diagnosis not present

## 2018-05-04 DIAGNOSIS — R1900 Intra-abdominal and pelvic swelling, mass and lump, unspecified site: Secondary | ICD-10-CM | POA: Insufficient documentation

## 2018-05-04 DIAGNOSIS — K8020 Calculus of gallbladder without cholecystitis without obstruction: Secondary | ICD-10-CM | POA: Diagnosis not present

## 2018-05-04 HISTORY — PX: CYSTOSCOPY WITH RETROGRADE PYELOGRAM, URETEROSCOPY AND STENT PLACEMENT: SHX5789

## 2018-05-04 LAB — URINALYSIS, ROUTINE W REFLEX MICROSCOPIC
BILIRUBIN URINE: NEGATIVE
Glucose, UA: NEGATIVE mg/dL
KETONES UR: NEGATIVE mg/dL
Nitrite: NEGATIVE
PH: 5 (ref 5.0–8.0)
PROTEIN: NEGATIVE mg/dL
Specific Gravity, Urine: 1.01 (ref 1.005–1.030)

## 2018-05-04 LAB — BASIC METABOLIC PANEL
Anion gap: 10 (ref 5–15)
BUN: 11 mg/dL (ref 8–23)
CHLORIDE: 106 mmol/L (ref 98–111)
CO2: 26 mmol/L (ref 22–32)
Calcium: 9.6 mg/dL (ref 8.9–10.3)
Creatinine, Ser: 0.81 mg/dL (ref 0.44–1.00)
GFR calc Af Amer: >60 mL/min (ref >60)
GFR calc non Af Amer: >60 mL/min (ref >60)
GLUCOSE: 113 mg/dL — AB (ref 70–99)
POTASSIUM: 4.1 mmol/L (ref 3.5–5.1)
Sodium: 142 mmol/L (ref 135–145)

## 2018-05-04 LAB — CBC WITH DIFFERENTIAL/PLATELET
Basophils Absolute: 0 10*3/uL (ref 0.0–0.1)
Basophils Relative: 0 %
EOS PCT: 1 %
Eosinophils Absolute: 0.1 10*3/uL (ref 0.0–0.7)
HCT: 39.2 % (ref 36.0–46.0)
Hemoglobin: 12.8 g/dL (ref 12.0–15.0)
LYMPHS ABS: 1.4 10*3/uL (ref 0.7–4.0)
LYMPHS PCT: 12 %
MCH: 29 pg (ref 26.0–34.0)
MCHC: 32.7 g/dL (ref 30.0–36.0)
MCV: 88.7 fL (ref 78.0–100.0)
MONO ABS: 0.6 10*3/uL (ref 0.1–1.0)
Monocytes Relative: 5 %
Neutro Abs: 9.7 10*3/uL — ABNORMAL HIGH (ref 1.7–7.7)
Neutrophils Relative %: 82 %
PLATELETS: 265 10*3/uL (ref 150–400)
RBC: 4.42 MIL/uL (ref 3.87–5.11)
RDW: 14.1 % (ref 11.5–15.5)
WBC: 11.8 10*3/uL — ABNORMAL HIGH (ref 4.0–10.5)

## 2018-05-04 SURGERY — CYSTOURETEROSCOPY, WITH RETROGRADE PYELOGRAM AND STENT INSERTION
Anesthesia: Monitor Anesthesia Care | Laterality: Bilateral

## 2018-05-04 SURGERY — Surgical Case
Anesthesia: *Unknown

## 2018-05-04 MED ORDER — DIPHENHYDRAMINE HCL 50 MG/ML IJ SOLN: 12.5000 mg | Freq: Four times a day (QID) | INTRAMUSCULAR | Status: DC | PRN

## 2018-05-04 MED ORDER — ONDANSETRON HCL 4 MG PO TABS: 4.0000 mg | ORAL_TABLET | Freq: Four times a day (QID) | ORAL | Status: DC | PRN

## 2018-05-04 MED ORDER — ONDANSETRON HCL 4 MG/2ML IJ SOLN
4.0000 mg | Freq: Once | INTRAMUSCULAR | Status: AC
  Administered 2018-05-04: 4 mg via INTRAVENOUS
  Filled 2018-05-04: qty 2

## 2018-05-04 MED ORDER — LIDOCAINE HCL URETHRAL/MUCOSAL 2 % EX GEL
CUTANEOUS | Status: DC | PRN
  Administered 2018-05-04: 1

## 2018-05-04 MED ORDER — MEPERIDINE HCL 25 MG/ML IJ SOLN
6.2500 mg | INTRAMUSCULAR | Status: DC | PRN
  Filled 2018-05-04: qty 1

## 2018-05-04 MED ORDER — PROPOFOL 10 MG/ML IV BOLUS
INTRAVENOUS | Status: DC | PRN
  Administered 2018-05-04 (×2): 50 mg via INTRAVENOUS

## 2018-05-04 MED ORDER — CARBIDOPA-LEVODOPA 25-100 MG PO TABS
1.0000 | ORAL_TABLET | Freq: Three times a day (TID) | ORAL | Status: DC
  Administered 2018-05-04: 1 via ORAL
  Filled 2018-05-04: qty 1

## 2018-05-04 MED ORDER — HYDROCODONE-ACETAMINOPHEN 5-325 MG PO TABS: 1.0000 | ORAL_TABLET | ORAL | Status: DC | PRN

## 2018-05-04 MED ORDER — MIRABEGRON ER 25 MG PO TB24
50.0000 mg | ORAL_TABLET | Freq: Every day | ORAL | Status: DC
  Administered 2018-05-04 – 2018-05-05 (×2): 50 mg via ORAL
  Filled 2018-05-04 (×3): qty 2

## 2018-05-04 MED ORDER — BELLADONNA ALKALOIDS-OPIUM 16.2-60 MG RE SUPP
RECTAL | Status: DC | PRN
  Administered 2018-05-04: 1 via RECTAL

## 2018-05-04 MED ORDER — CLONIDINE HCL 0.2 MG PO TABS
0.2000 mg | ORAL_TABLET | Freq: Two times a day (BID) | ORAL | Status: DC
  Administered 2018-05-04 – 2018-05-05 (×2): 0.2 mg via ORAL
  Filled 2018-05-04 (×2): qty 1

## 2018-05-04 MED ORDER — FENTANYL CITRATE (PF) 100 MCG/2ML IJ SOLN
INTRAMUSCULAR | Status: AC
  Filled 2018-05-04: qty 2

## 2018-05-04 MED ORDER — PANTOPRAZOLE SODIUM 40 MG PO TBEC
40.0000 mg | DELAYED_RELEASE_TABLET | Freq: Every day | ORAL | Status: DC
  Administered 2018-05-04 – 2018-05-05 (×2): 40 mg via ORAL
  Filled 2018-05-04 (×2): qty 1

## 2018-05-04 MED ORDER — DOCUSATE SODIUM 100 MG PO CAPS
100.0000 mg | ORAL_CAPSULE | Freq: Two times a day (BID) | ORAL | Status: DC
  Administered 2018-05-04 – 2018-05-05 (×2): 100 mg via ORAL
  Filled 2018-05-04 (×2): qty 1

## 2018-05-04 MED ORDER — LIDOCAINE 2% (20 MG/ML) 5 ML SYRINGE
INTRAMUSCULAR | Status: DC | PRN
  Administered 2018-05-04: 50 mg via INTRAVENOUS

## 2018-05-04 MED ORDER — LACTATED RINGERS IV SOLN
INTRAVENOUS | Status: DC
  Administered 2018-05-04: 15:00:00 via INTRAVENOUS
  Filled 2018-05-04: qty 1000

## 2018-05-04 MED ORDER — CARBIDOPA-LEVODOPA 25-100 MG PO TABS
1.0000 | ORAL_TABLET | Freq: Every day | ORAL | Status: DC
  Administered 2018-05-04 – 2018-05-05 (×4): 1 via ORAL
  Filled 2018-05-04 (×4): qty 1

## 2018-05-04 MED ORDER — MIDAZOLAM HCL 2 MG/2ML IJ SOLN
INTRAMUSCULAR | Status: AC
  Filled 2018-05-04: qty 2

## 2018-05-04 MED ORDER — MIDAZOLAM HCL 5 MG/5ML IJ SOLN
INTRAMUSCULAR | Status: DC | PRN
  Administered 2018-05-04 (×2): 1 mg via INTRAVENOUS

## 2018-05-04 MED ORDER — CLONAZEPAM 0.5 MG PO TABS
0.5000 mg | ORAL_TABLET | Freq: Every day | ORAL | Status: DC
  Administered 2018-05-04: 0.5 mg via ORAL
  Filled 2018-05-04: qty 1

## 2018-05-04 MED ORDER — IRBESARTAN 300 MG PO TABS
300.0000 mg | ORAL_TABLET | Freq: Every day | ORAL | Status: DC
  Administered 2018-05-04 – 2018-05-05 (×2): 300 mg via ORAL
  Filled 2018-05-04 (×2): qty 1

## 2018-05-04 MED ORDER — OXYCODONE HCL 5 MG/5ML PO SOLN
5.0000 mg | Freq: Once | ORAL | Status: DC | PRN
  Filled 2018-05-04: qty 5

## 2018-05-04 MED ORDER — CEFAZOLIN SODIUM-DEXTROSE 2-4 GM/100ML-% IV SOLN
2.0000 g | INTRAVENOUS | Status: AC
  Administered 2018-05-04: 2 g via INTRAVENOUS
  Filled 2018-05-04: qty 100

## 2018-05-04 MED ORDER — ONDANSETRON HCL 4 MG/2ML IJ SOLN
INTRAMUSCULAR | Status: AC
  Filled 2018-05-04: qty 2

## 2018-05-04 MED ORDER — OXYCODONE HCL 5 MG PO TABS
5.0000 mg | ORAL_TABLET | Freq: Once | ORAL | Status: DC | PRN
  Filled 2018-05-04: qty 1

## 2018-05-04 MED ORDER — LABETALOL HCL 5 MG/ML IV SOLN
INTRAVENOUS | Status: DC | PRN
  Administered 2018-05-04: 5 mg via INTRAVENOUS

## 2018-05-04 MED ORDER — FENTANYL CITRATE (PF) 100 MCG/2ML IJ SOLN
25.0000 ug | INTRAMUSCULAR | Status: DC | PRN
  Administered 2018-05-04 (×4): 25 ug via INTRAVENOUS
  Filled 2018-05-04: qty 1

## 2018-05-04 MED ORDER — SODIUM CHLORIDE 0.9 % IR SOLN
Status: DC | PRN
  Administered 2018-05-04: 3000 mL via INTRAVESICAL

## 2018-05-04 MED ORDER — CARBIDOPA-LEVODOPA 25-100 MG PO TABS
1.0000 | ORAL_TABLET | Freq: Three times a day (TID) | ORAL | Status: DC
  Administered 2018-05-04: 2 via ORAL
  Filled 2018-05-04: qty 1

## 2018-05-04 MED ORDER — LIDOCAINE HCL URETHRAL/MUCOSAL 2 % EX GEL
CUTANEOUS | Status: AC
  Filled 2018-05-04: qty 5

## 2018-05-04 MED ORDER — ACETAMINOPHEN 325 MG PO TABS: 650.0000 mg | ORAL_TABLET | ORAL | Status: DC | PRN

## 2018-05-04 MED ORDER — PROPOFOL 500 MG/50ML IV EMUL
INTRAVENOUS | Status: AC
  Filled 2018-05-04: qty 50

## 2018-05-04 MED ORDER — PROMETHAZINE HCL 25 MG/ML IJ SOLN
6.2500 mg | INTRAMUSCULAR | Status: DC | PRN
  Administered 2018-05-04: 6.25 mg via INTRAVENOUS
  Filled 2018-05-04: qty 1

## 2018-05-04 MED ORDER — PROPOFOL 500 MG/50ML IV EMUL
INTRAVENOUS | Status: DC | PRN
  Administered 2018-05-04: 50 ug/kg/min via INTRAVENOUS

## 2018-05-04 MED ORDER — ATENOLOL 50 MG PO TABS
100.0000 mg | ORAL_TABLET | Freq: Every day | ORAL | Status: DC
  Administered 2018-05-04 – 2018-05-05 (×2): 100 mg via ORAL
  Filled 2018-05-04 (×2): qty 2

## 2018-05-04 MED ORDER — PRENATAL MULTIVITAMIN CH
1.0000 | ORAL_TABLET | Freq: Every day | ORAL | Status: DC
  Administered 2018-05-05: 1 via ORAL
  Filled 2018-05-04: qty 1

## 2018-05-04 MED ORDER — CEFAZOLIN SODIUM-DEXTROSE 2-4 GM/100ML-% IV SOLN
INTRAVENOUS | Status: AC
  Filled 2018-05-04: qty 100

## 2018-05-04 MED ORDER — CARBIDOPA-LEVODOPA 25-100 MG PO TABS
1.0000 | ORAL_TABLET | Freq: Three times a day (TID) | ORAL | Status: DC
  Filled 2018-05-04: qty 1

## 2018-05-04 MED ORDER — CARBIDOPA-LEVODOPA 25-100 MG PO TABS
1.0000 | ORAL_TABLET | Freq: Three times a day (TID) | ORAL | Status: DC
  Filled 2018-05-04 (×6): qty 1

## 2018-05-04 MED ORDER — ZOLPIDEM TARTRATE 5 MG PO TABS: 5.0000 mg | ORAL_TABLET | Freq: Every evening | ORAL | Status: DC | PRN

## 2018-05-04 MED ORDER — MORPHINE SULFATE (PF) 4 MG/ML IV SOLN
4.0000 mg | Freq: Once | INTRAVENOUS | Status: AC
  Administered 2018-05-04: 4 mg via INTRAVENOUS
  Filled 2018-05-04: qty 1

## 2018-05-04 MED ORDER — SODIUM CHLORIDE 0.9 % IV SOLN
INTRAVENOUS | Status: DC
  Administered 2018-05-04: 20:00:00 via INTRAVENOUS

## 2018-05-04 MED ORDER — IOHEXOL 300 MG/ML  SOLN
INTRAMUSCULAR | Status: DC | PRN
  Administered 2018-05-04: 10 mL via URETHRAL

## 2018-05-04 MED ORDER — BELLADONNA ALKALOIDS-OPIUM 16.2-60 MG RE SUPP: 1.0000 | Freq: Four times a day (QID) | RECTAL | Status: DC | PRN

## 2018-05-04 MED ORDER — BELLADONNA ALKALOIDS-OPIUM 16.2-60 MG RE SUPP
RECTAL | Status: AC
  Filled 2018-05-04: qty 1

## 2018-05-04 MED ORDER — DIPHENHYDRAMINE HCL 12.5 MG/5ML PO ELIX: 12.5000 mg | ORAL_SOLUTION | Freq: Four times a day (QID) | ORAL | Status: DC | PRN

## 2018-05-04 MED ORDER — HYDROMORPHONE HCL 1 MG/ML IJ SOLN: 0.2000 mg | INTRAMUSCULAR | Status: DC | PRN

## 2018-05-04 MED ORDER — SODIUM CHLORIDE 0.9 % IV SOLN: INTRAVENOUS | Status: DC

## 2018-05-04 MED ORDER — CARBIDOPA-LEVODOPA ER 50-200 MG PO TBCR
1.0000 | EXTENDED_RELEASE_TABLET | Freq: Every day | ORAL | Status: DC
  Administered 2018-05-04: 1 via ORAL
  Filled 2018-05-04 (×2): qty 1

## 2018-05-04 MED ORDER — FENTANYL CITRATE (PF) 100 MCG/2ML IJ SOLN
INTRAMUSCULAR | Status: DC | PRN
  Administered 2018-05-04 (×2): 50 ug via INTRAVENOUS

## 2018-05-04 MED ORDER — ONDANSETRON HCL 4 MG/2ML IJ SOLN
4.0000 mg | INTRAMUSCULAR | Status: DC | PRN
  Administered 2018-05-04: 4 mg via INTRAVENOUS
  Filled 2018-05-04: qty 2

## 2018-05-04 MED ORDER — FENTANYL CITRATE (PF) 100 MCG/2ML IJ SOLN
50.0000 ug | Freq: Once | INTRAMUSCULAR | Status: AC
  Administered 2018-05-04: 50 ug via INTRAVENOUS
  Filled 2018-05-04: qty 2

## 2018-05-04 MED ORDER — LIDOCAINE 2% (20 MG/ML) 5 ML SYRINGE
INTRAMUSCULAR | Status: AC
  Filled 2018-05-04: qty 5

## 2018-05-04 MED ORDER — ONDANSETRON HCL 4 MG/2ML IJ SOLN: 4.0000 mg | Freq: Four times a day (QID) | INTRAMUSCULAR | Status: DC | PRN

## 2018-05-04 MED ORDER — OXYBUTYNIN CHLORIDE 5 MG PO TABS
5.0000 mg | ORAL_TABLET | Freq: Two times a day (BID) | ORAL | Status: DC
  Administered 2018-05-04 – 2018-05-05 (×2): 5 mg via ORAL
  Filled 2018-05-04 (×2): qty 1

## 2018-05-04 MED ORDER — HYDRALAZINE HCL 50 MG PO TABS
50.0000 mg | ORAL_TABLET | Freq: Two times a day (BID) | ORAL | Status: DC
  Administered 2018-05-04 – 2018-05-05 (×2): 50 mg via ORAL
  Filled 2018-05-04 (×2): qty 1

## 2018-05-04 MED ORDER — HYDROMORPHONE HCL 1 MG/ML IJ SOLN: 0.5000 mg | INTRAMUSCULAR | Status: DC | PRN

## 2018-05-04 MED ORDER — OXYCODONE-ACETAMINOPHEN 5-325 MG PO TABS: 1.0000 | ORAL_TABLET | ORAL | Status: DC | PRN

## 2018-05-04 MED ORDER — PROMETHAZINE HCL 25 MG/ML IJ SOLN
INTRAMUSCULAR | Status: AC
  Filled 2018-05-04: qty 1

## 2018-05-04 SURGICAL SUPPLY — 24 items
BAG DRAIN URO-CYSTO SKYTR STRL (DRAIN) ×2 IMPLANT
BAG DRN UROCATH (DRAIN) ×1
BASKET STONE 1.7 NGAGE (UROLOGICAL SUPPLIES) IMPLANT
CATH INTERMIT  6FR 70CM (CATHETERS) IMPLANT
CLOTH BEACON ORANGE TIMEOUT ST (SAFETY) ×2 IMPLANT
EXTRACTOR STONE 1.7FRX115CM (UROLOGICAL SUPPLIES) IMPLANT
FIBER LASER FLEXIVA 365 (UROLOGICAL SUPPLIES) IMPLANT
FIBER LASER TRAC TIP (UROLOGICAL SUPPLIES) IMPLANT
GLOVE BIO SURGEON STRL SZ8 (GLOVE) ×2 IMPLANT
GOWN STRL REUS W/TWL XL LVL3 (GOWN DISPOSABLE) ×2 IMPLANT
GUIDEWIRE ANG ZIPWIRE 038X150 (WIRE) ×2 IMPLANT
GUIDEWIRE STR DUAL SENSOR (WIRE) IMPLANT
INFUSOR MANOMETER BAG 3000ML (MISCELLANEOUS) ×2 IMPLANT
IV NS 1000ML (IV SOLUTION) ×2
IV NS 1000ML BAXH (IV SOLUTION) ×1 IMPLANT
IV NS IRRIG 3000ML ARTHROMATIC (IV SOLUTION) ×2 IMPLANT
KIT TURNOVER CYSTO (KITS) ×2 IMPLANT
MANIFOLD NEPTUNE II (INSTRUMENTS) ×2 IMPLANT
NS IRRIG 500ML POUR BTL (IV SOLUTION) ×2 IMPLANT
PACK CYSTO (CUSTOM PROCEDURE TRAY) ×2 IMPLANT
STENT URET 6FRX26 CONTOUR (STENTS) ×1 IMPLANT
SYR 10ML LL (SYRINGE) ×2 IMPLANT
TUBE CONNECTING 12X1/4 (SUCTIONS) IMPLANT
TUBING UROLOGY SET (TUBING) IMPLANT

## 2018-05-04 NOTE — ED Notes (Signed)
Pt reminded of need for UA. Pt unable to void at this time

## 2018-05-04 NOTE — ED Notes (Signed)
Pt transported by [employer] CNA

## 2018-05-04 NOTE — Op Note (Signed)
.  Preoperative diagnosis: Left hydronephrosis  Postoperative diagnosis: Same  Procedure: 1 cystoscopy 2. Bilateral retrograde pyelography 3.  Intraoperative fluoroscopy, under one hour, with interpretation 4. Left 6 x 26 JJ stent placement  Attending: Nicolette Bang  Anesthesia: General  Estimated blood loss: None  Drains: Left 6 x 26 JJ ureteral stent without tether  Specimens: none  Antibiotics: ancef  Findings: Normal right retrograde pyelogram. left proximal ureteral narrowing with associated moderate hydronephrosis. No evidence of urine leak. No masses/lesions in the bladder. Ureteral orifices in normal anatomic location.  Indications: Patient is a 62 year old female with new left hydronephrosis after left ovarian lass excision. She was found to have a fluid collection compressing her left ureter. After discussing treatment options, they decided proceed with left stent placement and bilateral retrograde pyelograms.  Procedure her in detail: The patient was brought to the operating room and a brief timeout was done to ensure correct patient, correct procedure, correct site.  General anesthesia was administered patient was placed in dorsal lithotomy position.  Their genitalia was then prepped and draped in usual sterile fashion.  A rigid 26 French cystoscope was passed in the urethra and the bladder.  Bladder was inspected free masses or lesions.  the ureteral orifices were in the normal orthotopic locations. a 6 french ureteral catheter was then instilled into the right ureteral orifice.  a gentle retrograde was obtained and findings noted above.  a 6 french ureteral catheter was then instilled into the left ureteral orifice.  a gentle retrograde was obtained and findings noted above.  we then placed a zip wire through the ureteral catheter and advanced up to the renal pelvis.    We then placed a 6 x 26 double-j ureteral stent over the original zip wire.  We then removed the wire and  good coil was noted in the the renal pelvis under fluoroscopy and the bladder under direct vision.  the bladder was then drained and this concluded the procedure which was well tolerated by patient.  Complications: None  Condition: Stable, extubated, transferred to PACU  Plan: Patient is to be admitted for observation. The stent is to remain in place until the fluid collection was resolved.

## 2018-05-04 NOTE — ED Notes (Signed)
Pt attempted to void without success.

## 2018-05-04 NOTE — ED Notes (Signed)
Called urology- had Dr Alyson Ingles to call Dr Tamera Punt at [phone]

## 2018-05-04 NOTE — ED Notes (Signed)
ED TO INPATIENT HANDOFF REPORT  Name/Age/Gender Jacqueline Atkinson 62 y.o. female  Code Status Code Status History    Date Active Date Inactive Code Status Order ID Comments User Context   04/03/2018 0808 04/05/2018 1507 DNR [id number]  Isabel Caprice, MD Inpatient   04/02/2018 1849 04/03/2018 0808 Full Code [id number]  Isabel Caprice, MD Inpatient   04/02/2018 1849 04/02/2018 1849 Full Code [id number]  Isabel Caprice, MD Inpatient   02/13/2016 1507 04/02/2018 1059 DNR [id number]  Annamaria Helling, [facility]   08/22/2015 1733 08/25/2015 1814 Full Code [id number]  Latanya Maudlin, MD Inpatient    Questions for Most Recent Historical Code Status (Order [id number])    Question Answer Comment   In the event of cardiac or respiratory ARREST Do not call a "code blue"    In the event of cardiac or respiratory ARREST Do not perform Intubation, CPR, defibrillation or ACLS    In the event of cardiac or respiratory ARREST Use medication by any route, position, wound care, and other measures to relive pain and suffering. May use oxygen, suction and manual treatment of airway obstruction as needed for comfort.    Comments Patient verbalized prior status of DNR to be in place       Home/SNF/Other Home  Chief Complaint left flank pain  Level of Care/Admitting Diagnosis ED Disposition    ED Disposition Condition Comment   Admit  The patient appears reasonably stabilized for admission considering the current resources, flow, and capabilities available in the ED at this time, and I doubt any other [facility] requiring further screening and/or treatment in the ED prior to admission is  present.       Medical History Past Medical History:  Diagnosis Date  . Anemia    hx of  . Anxiety   . Arthritis    Left knee  osteoarthritis  . GERD (gastroesophageal reflux disease)   . Heart murmur   . History of hiatal hernia    small  . Hx of seasonal allergies    occ. use OTC Mucinex or Antihistamine.  .  Hyperlipidemia   . Hypertension   . Neuromuscular disorder ([place])    "multi system atrophy disease"- "pain, bilateral foot numbness, left knee pain"- Dr. Carles Collet is following.  . Ovarian cancer on left ([place]) 2019   serous borderline s/p BSO  . Pneumonia   . Shoulder disorder    right shoulder "rigidity due to Multi system atrophy"- ROM not limited"just tires me"    Allergies Allergies  Allergen Reactions  . Sulfa Antibiotics     Long time ago and does not remember reaction    IV Location/Drains/Wounds Patient Lines/Drains/Airways Status   Active Line/Drains/Airways    Name:   Placement date:   Placement time:   Site:   Days:   Peripheral IV 05/04/18 Left Hand   05/04/18    1009    Hand   less than 1   Incision (Closed) 02/08/15 Leg Left   02/08/15    1451     1181   Incision (Closed) 08/22/15 Knee Left   08/22/15    1440     986   Incision (Closed) 04/02/18 Abdomen   04/02/18    1645     32          Labs/Imaging Results for orders placed or performed during the hospital encounter of 05/04/18 (from the past 48 hour(s))  Basic metabolic panel     Status: Abnormal  Collection Time: 05/04/18 10:09 AM  Result Value Ref Range   Sodium 142 135 - 145 mmol/L   Potassium 4.1 3.5 - 5.1 mmol/L   Chloride 106 98 - 111 mmol/L    Comment: Please note change in reference range.   CO2 26 22 - 32 mmol/L   Glucose, Bld 113 (H) 70 - 99 mg/dL    Comment: Please note change in reference range.   BUN 11 8 - 23 mg/dL    Comment: Please note change in reference range.   Creatinine, Ser 0.81 0.44 - 1.00 mg/dL   Calcium 9.6 8.9 - 10.3 mg/dL   GFR calc non Af Amer >60 >60 mL/min   GFR calc Af Amer >60 >60 mL/min    Comment: (NOTE) The eGFR has been calculated using the CKD EPI equation. This calculation has not been validated in all clinical situations. eGFR's persistently <60 mL/min signify possible Chronic Kidney Disease.    Anion gap 10 5 - 15    Comment: Performed at [facility], [place] [address], [place], [place] [address]  CBC with Differential     Status: Abnormal   Collection Time: 05/04/18 10:09 AM  Result Value Ref Range   WBC 11.8 (H) 4.0 - 10.5 K/uL   RBC 4.42 3.87 - 5.11 MIL/uL   Hemoglobin 12.8 12.0 - 15.0 g/dL   HCT 39.2 36.0 - 46.0 %   MCV 88.7 78.0 - 100.0 fL   MCH 29.0 26.0 - 34.0 pg   MCHC 32.7 30.0 - 36.0 g/dL   RDW 14.1 11.5 - 15.5 %   Platelets 265 150 - 400 K/uL   Neutrophils Relative % 82 %   Neutro Abs 9.7 (H) 1.7 - 7.7 K/uL   Lymphocytes Relative 12 %   Lymphs Abs 1.4 0.7 - 4.0 K/uL   Monocytes Relative 5 %   Monocytes Absolute 0.6 0.1 - 1.0 K/uL   Eosinophils Relative 1 %   Eosinophils Absolute 0.1 0.0 - 0.7 K/uL   Basophils Relative 0 %   Basophils Absolute 0.0 0.0 - 0.1 K/uL    Comment: Performed at [facility], [place] [address] [place], [place] [address]   Ct Renal Stone Study  Result Date: 05/04/2018 CLINICAL DATA:  Acute left-sided abdominal pain. EXAM: CT ABDOMEN AND PELVIS WITHOUT CONTRAST TECHNIQUE: Multidetector CT imaging of the abdomen and pelvis was performed following the standard protocol without IV contrast. COMPARISON:  CT scan of October 02, 2017 and MRI scan of Mar 10, 2018. FINDINGS: Lower chest: Large hiatal hernia is noted. Visualized lung bases are unremarkable. Hepatobiliary: Minimal cholelithiasis is noted without inflammation. The liver is unremarkable on these unenhanced images. No biliary dilatation is noted. Pancreas: Unremarkable. No pancreatic ductal dilatation or surrounding inflammatory changes. Spleen: Normal in size without focal abnormality. Adrenals/Urinary Tract: Adrenal glands appear normal. Right kidney and ureter are unremarkable. Urinary bladder appears normal. Moderate left hydroureteronephrosis is noted. No obstructing calculus is noted. This may be due to possible external compression by 5.3 x 3.6 cm left retroperitoneal mass or complex fluid collection. Stomach/Bowel:  Stomach is within normal limits. Appendix appears normal. No evidence of bowel wall thickening, distention, or inflammatory changes. Vascular/Lymphatic: Mild atherosclerotic calcifications of abdominal aorta and iliac arteries are noted. As noted above, 5.3 x 3.6 cm rounded left retroperitoneal density is noted concerning for adenopathy or neoplasm, or possibly complex fluid collection. Reproductive: Status post hysterectomy and bilateral oophorectomy. Other: No abdominal wall hernia or abnormality. No abdominopelvic  ascites. Musculoskeletal: No acute or significant osseous findings. IMPRESSION: Status post recent hysterectomy and bilateral oophorectomy. There is interval development of 5.3 x 3.6 cm left retroperitoneal density which appears to be causing external compression of the left ureter, resulting in moderate left hydronephrosis. This density is concerning for possible metastatic adenopathy or neoplasm, or possibly complex fluid collection. Given the history of recent surgery, seroma cannot be excluded. Further evaluation with MRI with and without gadolinium administration is recommended. Large hiatal hernia. Minimal cholelithiasis without inflammation. Electronically Signed   By: Marijo Conception, M.D.   On: 05/04/2018 10:58    Pending Labs Unresulted Labs (From admission, onward)   Start     Ordered   05/04/18 0936  Urinalysis, Routine w reflex microscopic- may I&O cath if menses  Once,   STAT     05/04/18 0935      Vitals/Pain Today's Vitals   05/04/18 1105 05/04/18 1127 05/04/18 1127 05/04/18 1130  BP:    (!) 168/95  Pulse:    76  Resp:    14  Temp:      TempSrc:      SpO2:    95%  Weight:      Height:      PainSc: 10-Worst pain ever 8  8      Isolation Precautions No active isolations  Medications Medications  fentaNYL (SUBLIMAZE) injection 50 mcg (50 mcg Intravenous Given 05/04/18 1008)  ondansetron (ZOFRAN) injection 4 mg (4 mg Intravenous Given 05/04/18 1007)  morphine 4  MG/ML injection 4 mg (4 mg Intravenous Given 05/04/18 1109)    Mobility walks

## 2018-05-04 NOTE — Transfer of Care (Signed)
Immediate Anesthesia Transfer of Care Note  Patient: Jacqueline Atkinson  Procedure(s) Performed: CYSTOSCOPY WITH RETROGRADE PYELOGRAM, URETEROSCOPY AND STENT PLACEMENT (Bilateral )  Patient Location: PACU  Anesthesia Type:MAC  Level of Consciousness: drowsy  Airway & Oxygen Therapy: Patient Spontanous Breathing and Patient connected to face mask oxygen  Post-op Assessment: Report given to RN  Post vital signs: Reviewed and stable  Last Vitals: 156/90, 70, 20, 100%, 98.4 Vitals Value Taken Time  BP 156/90 05/04/2018  3:23 PM  Temp 36.9 C 05/04/2018  3:23 PM  Pulse 70 05/04/2018  3:26 PM  Resp 17 05/04/2018  3:26 PM  SpO2 100 % 05/04/2018  3:26 PM  Vitals shown include unvalidated device data.  Last Pain:  Vitals:   05/04/18 1434  TempSrc:   PainSc: 7       Patients Stated Pain Goal: 5 (83/43/73 5789)  Complications: No apparent anesthesia complications

## 2018-05-04 NOTE — Telephone Encounter (Signed)
Patient called in to say that she is having sharp left sided pain, and having chills.  Per Joylene John, NP suggested that patient go to the urgent care or primary care physician , or the ed if the pain gets worse.  Patient states that her daughter is going to take her to get medical attention. Patient is scheduled for a 2:30pm appointment with Dr. Gerarda Fraction today.  Patient states "  I may not be able to keep my appointment".  I told patient to let our office know and we can reschedule when she is feeling better.  Patient verbalized understanding.

## 2018-05-04 NOTE — Anesthesia Preprocedure Evaluation (Signed)
Anesthesia Evaluation  Patient identified by MRN, date of birth, ID band Patient awake    Reviewed: Allergy & Precautions, NPO status , Patient's Chart, lab work & pertinent test results, reviewed documented beta blocker date and time   Airway Mallampati: II  TM Distance: >3 FB Neck ROM: Full    Dental no notable dental hx. (+) Teeth Intact, Dental Advisory Given   Pulmonary former smoker,    Pulmonary exam normal breath sounds clear to auscultation       Cardiovascular Exercise Tolerance: Good hypertension, Pt. on medications and Pt. 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 Cerebellar problems and autonomic instabiltiy - hypotension with General anesthesia.  Regional [place].  Neuromuscular disease    GI/Hepatic Neg liver ROS, hiatal hernia, GERD  Medicated and Controlled,Esophageal stricture   Endo/Other  Obesity  Renal/GU negative Renal ROS  negative genitourinary   Musculoskeletal  (+) Arthritis , Osteoarthritis,    Abdominal (+) + obese,   Peds negative pediatric ROS (+)  Hematology negative hematology ROS (+)   Anesthesia Other Findings   Reproductive/Obstetrics negative OB ROS                             Anesthesia Physical  Anesthesia Plan  ASA: III  Anesthesia Plan: General and MAC   Post-op Pain Management:    Induction:   PONV Risk Score and Plan: 4 or greater and Treatment may vary due to age or medical condition, Ondansetron, Midazolam, Dexamethasone and Propofol infusion  Airway Management Planned: Natural Airway, Nasal Cannula and Simple Face Mask  Additional Equipment: None  Intra-op Plan:   Post-operative Plan:   Informed Consent: I have reviewed the patients History and Physical, chart, labs and  discussed the procedure including the risks, benefits and alternatives for the proposed anesthesia with the patient or authorized representative who has indicated his/her understanding and acceptance.     Plan Discussed with: CRNA and Surgeon  Anesthesia Plan Comments:         Anesthesia Quick Evaluation

## 2018-05-04 NOTE — H&P (Signed)
H&P Admission  CC:  Chief Complaint  Patient presents with  . Flank Pain  . Nausea    HPI: [name]  is a very nice 62 y.o. 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  This was diagnosed in 2015.  She states is a progressive neurologic disease and most people end up in a wheelchair possibly succumbed to their disease less than 10 years from the time of diagnosis.  She does currently have limited ambulation ability and presents today with a walker.  She is mostly concerned about her quality of life specifically the discomfort with standing for long periods while she is cooking.  Of less concern is the potential for malignancy as she does worry that treatment of such a process would decrease the quality of life of the possibly limited time she has left.   Interval History She was due for a final postop check today prior to disposition to surveillance. She had severe flank pain and instead presented to the ER. Workup with CT (renal protocol) revealed a cystic mass on my review at the left pelvic brim and mass effect on the left ureter causing hydronephrosis.  Urology has been called and plans to take her for a retrograde pyelogram and likely stent placement.  She is on the stretcher being transferred to preop holding when I am visiting. She expresses understanding of plan by urology.   Oncologic History Beginning October 2018 the patient noted some left lower back pain which worsened with standing.  Specifically it had been affecting her ADLs by interfering with her cooking meals.  At first she thought this was related to problems with her gait or her bony structure and she followed up with both physical therapy and orthopedics who felt that further work-up should be undertaken.  Recommendation was for imaging and a CT scan was ordered by her orthopedic surgeon December 2018.  She was informed that there  was a GYN process for which she should follow-up with gynecology however she postponed this as she felt that this would not be the explanation for the pain.  Ultimately she did follow-up with Dr. Tiana Loft.  Initial thought process was this may be a fibroid uterus and ultrasound was performed.  This revealed normal uterus but a large cystic structure possibly both one on the right measuring 8 cm and another on the left measuring 8 cm uncertain origin possibly bowel in nature and therefore an MRI was requested.  MRI revealed normal uterus with normal endometrial lining however a large multiloculated cystic mass was noted with numerous internal enhancing septations.  A small unilocular cyst was noted in the left ovary measuring 2.7 cm but the main mass up to 14 x 9 x 17 cm.  No free fluid or ascites.  There are internal areas of enhancing septations which are mildly thickened in the larger mass.  A Ca125 is normal at 13.1 on 02/23/2018.  In addition a CEA is noted to be normal at 1.2.  Given these findings patient was referred for management  She denied nausea and vomiting she had no significant changes in her body weight.  She noted decreased appetite and some early satiety.  Her normal bowel movements are every 4 to 5 days and that had not changed.  She noted some bladder spasms over the year prior. Pain was lower back upper left abdomen into her thighs 3 out of 10 which  lasted in varying intervals   I took her to the operating room on 03/05/18 and performed exploratory laparotomy with bilateral salpingo-oophorectomy with pelvicwashings. Frozen section revealed benign bilateral tubes/ovaries. However, final pathology revealed: 1. Ovary and fallopian tube, left - SEROUS BORDERLINE TUMOR/ATYPICAL PROLIFERATIVE SEROUS TUMOR (3.2 CM) CONFINED TO THE OVARIAN PARENCHYMA - BENIGN FALLOPIAN TUBE - SEE ONCOLOGY TABLE AND COMMENT BELOW;  Tumor Size: 3.2 x 1.5 cm Histologic Type: Serous borderline  tumor/atypical proliferative serous tumor Histologic Grade: Low grade Implants: Not identified 2. Ovary and fallopian tube, right - BENIGN SEROUS CYSTADENOFIBROMA (8.2 CM) - BENIGN FALLOPIAN TUBE WITH PARATUBAL CYST (0.8 CM) 3. 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   She is unsure what the side effects of her sulfa allergy ER.  She received this as a teenager.  Her neurologist for her degenerative disorder is Wells Guiles Tat, DO   Measurement of disease:  Follow tumor markers HOWEVER normal preop CA125 . Preoperative Ca1 25 equal 13.3, CEA 1.2  Radiology: . 10/02/2017-CT pelvis-[facility]-left ovary unremarkable right ovary not visualized multiple complex low-density structures arising from the uterus which may represent fibroids the largest measuring 9 cm. . 02/23/2018-transvaginal transabdominal ultrasound [facility]-large simple multicystic areas bilaterally within the adnexa felt to be ovarian in nature left 8 x 5 x 6.8, left 7.8 x 5.9 x 5.2.  Vascularity is noted in the septations between the cyst?  On transabdominal imaging there is a midline simple cyst 8.5 x 7 x 9.4. Marland Kitchen 02/28/2018-MRI pelvis-[facility]-uterus 5.6 x 4.8 x 5.2 with an unremarkable endometrium.  Large multiloculated cystic mass 14.9 x 9.9 x 17.1.  On previous exam which is the CT scan from December 2018 this is increased in size.  There are numerous internal areas of enhancing septation some of the septations are thin others are irregular and thickened up to 3 mm.  In the dominant locule there are multiple tiny mural nodules up to 3 mm.  There is a small unilocular cyst in the left ovary 2.7 cm.  No ascites or peritoneal nodularity identified. Dg Chest 2 View  Result Date: 04/10/2018 CLINICAL DATA:  Known left ovarian neoplasm EXAM: CHEST - 2 VIEW COMPARISON:  08/05/2017 FINDINGS: Cardiac shadows  within normal limits. The lungs are well aerated bilaterally. No focal infiltrate or sizable effusion is seen. No bony abnormality is noted. IMPRESSION: No active cardiopulmonary disease. Electronically Signed   By: Inez Catalina M.D.   On: 04/10/2018 12:00   Ct Renal Stone Study  Result Date: 05/04/2018 CLINICAL DATA:  Acute left-sided abdominal pain. EXAM: CT ABDOMEN AND PELVIS WITHOUT CONTRAST TECHNIQUE: Multidetector CT imaging of the abdomen and pelvis was performed following the standard protocol without IV contrast. COMPARISON:  CT scan of October 02, 2017 and MRI scan of Mar 10, 2018. FINDINGS: Lower chest: Large hiatal hernia is noted. Visualized lung bases are unremarkable. Hepatobiliary: Minimal cholelithiasis is noted without inflammation. The liver is unremarkable on these unenhanced images. No biliary dilatation is noted. Pancreas: Unremarkable. No pancreatic ductal dilatation or surrounding inflammatory changes. Spleen: Normal in size without focal abnormality. Adrenals/Urinary Tract: Adrenal glands appear normal. Right kidney and ureter are unremarkable. Urinary bladder appears normal. Moderate left hydroureteronephrosis is noted. No obstructing calculus is noted. This may be due to possible external compression by 5.3 x 3.6 cm left retroperitoneal mass or complex fluid collection. Stomach/Bowel: Stomach  is within normal limits. Appendix appears normal. No evidence of bowel wall thickening, distention, or inflammatory changes. Vascular/Lymphatic: Mild atherosclerotic calcifications of abdominal aorta and iliac arteries are noted. As noted above, 5.3 x 3.6 cm rounded left retroperitoneal density is noted concerning for adenopathy or neoplasm, or possibly complex fluid collection. Reproductive: Status post hysterectomy and bilateral oophorectomy. Other: No abdominal wall hernia or abnormality. No abdominopelvic ascites. Musculoskeletal: No acute or significant osseous findings. IMPRESSION: Status  post recent hysterectomy and bilateral oophorectomy. There is interval development of 5.3 x 3.6 cm left retroperitoneal density which appears to be causing external compression of the left ureter, resulting in moderate left hydronephrosis. This density is concerning for possible metastatic adenopathy or neoplasm, or possibly complex fluid collection. Given the history of recent surgery, seroma cannot be excluded. Further evaluation with MRI with and without gadolinium administration is recommended. Large hiatal hernia. Minimal cholelithiasis without inflammation. Electronically Signed   By: Marijo Conception, M.D.   On: 05/04/2018 10:58  .  Marland Kitchen   Current Meds:  Outpatient Encounter Medications as of 04/10/2018  Medication Sig  . acetaminophen (TYLENOL) 500 MG tablet Take 1,000 mg by mouth every 8 (eight) hours as needed for moderate pain.   Marland Kitchen atenolol (TENORMIN) 100 MG tablet Take 1 tablet (100 mg total) by mouth daily.  . carbidopa-levodopa (SINEMET CR) 50-200 MG tablet Take 1 tablet by mouth at bedtime.  . carbidopa-levodopa (SINEMET IR) 25-100 MG tablet TAKE 1 TABLET BY MOUTH 6 TIMES A DAY and 1-2 PRN daily (Patient taking differently: Take 1 tablet by mouth See admin instructions. TAKE 1 TABLET BY MOUTH 8 TIMES A DAY)  . Carboxymethylcellul-Glycerin (LUBRICATING EYE DROPS OP) Place 1 drop into both eyes 3 (three) times daily as needed (dry eyes).  . clonazePAM (KLONOPIN) 0.5 MG tablet Take 1 tablet (0.5 mg total) by mouth at bedtime.  . cloNIDine (CATAPRES) 0.2 MG tablet Take 1 tab po qam and 2 tabs po qhs (Patient taking differently: Take 0.2 mg by mouth 2 (two) times daily. )  . enoxaparin (LOVENOX) 40 MG/0.4ML injection Inject 0.4 mLs (40 mg total) into the skin daily.  . hydrALAZINE (APRESOLINE) 50 MG tablet Take 1 tablet (50 mg total) by mouth 4 (four) times daily. As needed for elevated BP and take 1 po qhs (Patient taking differently: Take 50 mg by mouth 4 (four) times daily as needed. As needed  for elevated BP and take 1 po qhs)  . mirabegron ER (MYRBETRIQ) 50 MG TB24 tablet Take 50 mg by mouth daily.  Marland Kitchen omeprazole (PRILOSEC) 20 MG capsule Take 20 mg by mouth 2 (two) times daily before a meal.  . oxybutynin (DITROPAN) 5 MG tablet Take 5 mg by mouth 2 (two) times daily.   Marland Kitchen senna-docusate (SENOKOT S) 8.6-50 MG tablet Take 2 tablets by mouth at bedtime.  Marland Kitchen telmisartan (MICARDIS) 80 MG tablet Take 1 tablet (80 mg total) by mouth daily.  . [DISCONTINUED] HYDROcodone-acetaminophen (NORCO/VICODIN) 5-325 MG tablet Take 1 tablet by mouth every 4 (four) hours as needed for moderate pain. (Patient not taking: Reported on 04/10/2018)   No facility-administered encounter medications on file as of 04/10/2018.     Allergy:  Allergies  Allergen Reactions  . Sulfa Antibiotics     Long time ago and does not remember reaction     Social Hx:  Tobacco use: 18-pack-year quit in 1989 Alcohol use: None Illicit Drug use: None Illicit IV Drug use: None    Past Surgical Hx:  Past Surgical History:  Procedure Laterality Date  . ABDOMINAL HYSTERECTOMY     04-02-18 Dr. Gerarda Fraction  . DILATION AND CURETTAGE, DIAGNOSTIC / THERAPEUTIC     1981 and 1998 due to missed AB  . ESOPHAGOGASTRODUODENOSCOPY ENDOSCOPY     with esophageal dilation  . FOOT SURGERY  2011   3rd metatarsal LT foot  . KNEE ARTHROSCOPY Left 02/08/2015   Procedure: LEFT KNEE ARTHROSCOPY ;  Surgeon: Latanya Maudlin, MD;  Location: [facility];  Service: Orthopedics;  Laterality: Left;  . LAPAROTOMY N/A 04/02/2018   Procedure: EXPLORATORY LAPAROTOMY;  Surgeon: Isabel Caprice, MD;  Location: [facility];  Service: Gynecology;  Laterality: N/A;  . SALPINGOOPHORECTOMY Bilateral 04/02/2018   Procedure: BILATERAL SALPINGO OOPHORECTOMY;  Surgeon: Isabel Caprice, MD;  Location: [facility];  Service: Gynecology;  Laterality: Bilateral;  . TONSILLECTOMY AND ADENOIDECTOMY    . TOTAL KNEE ARTHROPLASTY Left 08/22/2015   Procedure: LEFT TOTAL  KNEE  ARTHROPLASTY;   Surgeon: Latanya Maudlin, MD;  Location: [facility];  Service: Orthopedics;  Laterality: Left;    Past Medical Hx:  Past Medical History:  Diagnosis Date  . Anemia    hx of  . Anxiety   . Arthritis    Left knee  osteoarthritis  . GERD (gastroesophageal reflux disease)   . Heart murmur   . History of hiatal hernia    small  . Hx of seasonal allergies    occ. use OTC Mucinex or Antihistamine.  . Hyperlipidemia   . Hypertension   . Neuromuscular disorder ([place])    "multi system atrophy disease"- "pain, bilateral foot numbness, left knee pain"- Dr. Carles Collet is following.  . Ovarian cancer on left ([place]) 2019   serous borderline s/p BSO  . Pneumonia   . Shoulder disorder    right shoulder "rigidity due to Multi system atrophy"- ROM not limited"just tires me"    Past Gynecological History:   GYNECOLOGIC HISTORY:  No LMP recorded. Patient has had a hysterectomy. Menarche: 62 years old P 7; +5 miscarriages LMP 2008 Contraceptive oral contraceptives less than 10 years also used a diaphragm in the past HRT none  Last Pap 01/2018 negative for malignancy satisfactory for evaluation no HPV performed  Family Hx:  Family History  Problem Relation Age of Onset  . Diabetes Mother   . Kidney disease Mother   . Melanoma Mother        arm  . COPD Father   . Brain cancer Brother   . Melanoma Daughter     Review of Systems: As in HPI  Vitals:  Blood pressure (!) 158/83, pulse 74, temperature 98.4 F (36.9 C), resp. rate 19, height 5\' 8"  (1.727 m), weight 250 lb (113.4 kg), SpO2 100 %. Body mass index is 38.01 kg/m.   Physical Exam:   General :  Well developed, 62 y.o., female in mild apparent distress HEENT:  Normocephalic/atraumatic, symmetric, EOMI, eyelids normal Neck:   No visible masses.  Respiratory:  Respirations unlabored, no use of accessory muscles CV:   Deferred Breast:  Deferred Musculoskeletal: Normal muscle strength. Abdomen:  Unable to examine Extremities:  No  visible edema or deformities Skin:   Normal inspection Neuro/Psych:  No focal motor deficit, no abnormal mental status. Normal gait. Normal affect. Alert and oriented to person, place, and time    Oncologic Summary: 1. 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 Neuro degenerative disorder 3. Left Hydronephrosis/left pelvic mass   Assessment/Plan: 1. Serous Borderline  Ovarian CA ? Observation is planned. Prognosis is good. 2. Hydronephrosis ? Per urology (likely stent placement this afternoon) 3. Pelvic mass ? Hopefully hematoma or postoperative seroma.  ? Located where the IP ligament is ligated so hematoma possible given the postoperative Lovenox. ? Other etiology potential urinoma - per urology if that is the case.  4. Will admit to our service for observation, pain control, and defer to urology for further planning    Isabel Caprice, MD  05/04/2018, 3:36 PM

## 2018-05-04 NOTE — ED Triage Notes (Signed)
Patient c/o left flank pain that radiates into the left mid abdomen since 0400 today. patient denies any problems with urination.

## 2018-05-04 NOTE — H&P (Addendum)
Referring Physician: Dr. Gerarda Fraction  Chief Complaint: Left flank pain  History of Present Illness: Jacqueline Atkinson is a 62yo with of multisystem atrophy and left ovarian cancer who developed left flank pain yesterday. The pain was severe, sharp, constant, and nonradiaiting. She underwent excision of a left ovarian tumor 1 month ago. Ct scan from the ER showed a 2-3cm left periureteral fluid collection with moderate left hydronephrosis. Creatinine 0.8. No fevers/chills/sweats. She has nausea but no vomiting. No worsening lower urinary tract symptoms. No exacerbating/alleviating events. No other associated symptoms.  Past Medical History:  Diagnosis Date  . Anemia    hx of  . Anxiety   . Arthritis    Left knee  osteoarthritis  . GERD (gastroesophageal reflux disease)   . Heart murmur   . History of hiatal hernia    small  . Hx of seasonal allergies    occ. use OTC Mucinex or Antihistamine.  . Hyperlipidemia   . Hypertension   . Neuromuscular disorder ([place])    "multi system atrophy disease"- "pain, bilateral foot numbness, left knee pain"- Dr. Carles Collet is following.  . Ovarian cancer on left ([place]) 2019   serous borderline s/p BSO  . Pneumonia   . Shoulder disorder    right shoulder "rigidity due to Multi system atrophy"- ROM not limited"just tires me"   Past Surgical History:  Procedure Laterality Date  . ABDOMINAL HYSTERECTOMY     04-02-18 Dr. Gerarda Fraction  . DILATION AND CURETTAGE, DIAGNOSTIC / THERAPEUTIC     1981 and 1998 due to missed AB  . ESOPHAGOGASTRODUODENOSCOPY ENDOSCOPY     with esophageal dilation  . FOOT SURGERY  2011   3rd metatarsal LT foot  . KNEE ARTHROSCOPY Left 02/08/2015   Procedure: LEFT KNEE ARTHROSCOPY ;  Surgeon: Latanya Maudlin, MD;  Location: [facility];  Service: Orthopedics;  Laterality: Left;  . LAPAROTOMY N/A 04/02/2018   Procedure: EXPLORATORY LAPAROTOMY;  Surgeon: Isabel Caprice, MD;  Location: [facility];  Service: Gynecology;  Laterality: N/A;  . SALPINGOOPHORECTOMY  Bilateral 04/02/2018   Procedure: BILATERAL SALPINGO OOPHORECTOMY;  Surgeon: Isabel Caprice, MD;  Location: [facility];  Service: Gynecology;  Laterality: Bilateral;  . TONSILLECTOMY AND ADENOIDECTOMY    . TOTAL KNEE ARTHROPLASTY Left 08/22/2015   Procedure: LEFT TOTAL  KNEE  ARTHROPLASTY;  Surgeon: Latanya Maudlin, MD;  Location: [facility];  Service: Orthopedics;  Laterality: Left;    Home Medications:  Current Facility-Administered Medications  Medication Dose Route Frequency Provider Last Rate Last Dose  . carbidopa-levodopa (SINEMET IR) 25-100 MG per tablet immediate release 1 tablet  1 tablet Oral TID Hatchett, Franklin, MD      . ceFAZolin (ANCEF) IVPB 2g/100 mL premix  2 g Intravenous 30 min Pre-Op McKenzie, Candee Furbish, MD      . lactated ringers infusion   Intravenous Continuous Montez Hageman, MD       Allergies:  Allergies  Allergen Reactions  . Sulfa Antibiotics     Long time ago and does not remember reaction    Family History  Problem Relation Age of Onset  . Diabetes Mother   . Kidney disease Mother   . Melanoma Mother        arm  . COPD Father   . Brain cancer Brother   . Melanoma Daughter    Social History:  reports that she quit smoking about 30 years ago. She has a 12.00 pack-year smoking history. She has never used smokeless tobacco. She reports that she does not drink  alcohol or use drugs.  Review of Systems  Constitutional: Positive for malaise/fatigue.  Genitourinary: Positive for flank pain.  Neurological: Positive for weakness.  All other systems reviewed and are negative.   Physical Exam:  Vital signs in last 24 hours: Temp:  [98.5 F (36.9 C)-98.7 F (37.1 C)] 98.5 F (36.9 C) (07/08 1357) Pulse Rate:  [72-80] 73 (07/08 1357) Resp:  [14-18] 18 (07/08 1357) BP: (155-197)/(92-104) 173/99 (07/08 1357) SpO2:  [93 %-97 %] 97 % (07/08 1357) Weight:  [113.4 kg (250 lb)] 113.4 kg (250 lb) (07/08 1357) Physical Exam  Constitutional: She is oriented to  person, place, and time. She appears well-developed and well-nourished.  HENT:  Head: Normocephalic and atraumatic.  Eyes: Pupils are equal, round, and reactive to light. EOM are normal.  Neck: Normal range of motion. No thyromegaly present.  Cardiovascular: Normal rate and regular rhythm.  Respiratory: Effort normal. No respiratory distress.  GI: Soft. She exhibits no distension.  Musculoskeletal: Normal range of motion. She exhibits edema.  Neurological: She is alert and oriented to person, place, and time.  Skin: Skin is warm and dry.  Psychiatric: She has a normal mood and affect. Her behavior is normal. Judgment and thought content normal.    Laboratory Data:  Results for orders placed or performed during the hospital encounter of 05/04/18 (from the past 24 hour(s))  Basic metabolic panel     Status: Abnormal   Collection Time: 05/04/18 10:09 AM  Result Value Ref Range   Sodium 142 135 - 145 mmol/L   Potassium 4.1 3.5 - 5.1 mmol/L   Chloride 106 98 - 111 mmol/L   CO2 26 22 - 32 mmol/L   Glucose, Bld 113 (H) 70 - 99 mg/dL   BUN 11 8 - 23 mg/dL   Creatinine, Ser 0.81 0.44 - 1.00 mg/dL   Calcium 9.6 8.9 - 10.3 mg/dL   GFR calc non Af Amer >60 >60 mL/min   GFR calc Af Amer >60 >60 mL/min   Anion gap 10 5 - 15  CBC with Differential     Status: Abnormal   Collection Time: 05/04/18 10:09 AM  Result Value Ref Range   WBC 11.8 (H) 4.0 - 10.5 K/uL   RBC 4.42 3.87 - 5.11 MIL/uL   Hemoglobin 12.8 12.0 - 15.0 g/dL   HCT 39.2 36.0 - 46.0 %   MCV 88.7 78.0 - 100.0 fL   MCH 29.0 26.0 - 34.0 pg   MCHC 32.7 30.0 - 36.0 g/dL   RDW 14.1 11.5 - 15.5 %   Platelets 265 150 - 400 K/uL   Neutrophils Relative % 82 %   Neutro Abs 9.7 (H) 1.7 - 7.7 K/uL   Lymphocytes Relative 12 %   Lymphs Abs 1.4 0.7 - 4.0 K/uL   Monocytes Relative 5 %   Monocytes Absolute 0.6 0.1 - 1.0 K/uL   Eosinophils Relative 1 %   Eosinophils Absolute 0.1 0.0 - 0.7 K/uL   Basophils Relative 0 %   Basophils  Absolute 0.0 0.0 - 0.1 K/uL  Urinalysis, Routine w reflex microscopic- may I&O cath if menses     Status: Abnormal   Collection Time: 05/04/18 12:12 PM  Result Value Ref Range   Color, Urine YELLOW YELLOW   APPearance HAZY (A) CLEAR   Specific Gravity, Urine 1.010 1.005 - 1.030   pH 5.0 5.0 - 8.0   Glucose, UA NEGATIVE NEGATIVE mg/dL   Hgb urine dipstick SMALL (A) NEGATIVE   Bilirubin Urine NEGATIVE  NEGATIVE   Ketones, ur NEGATIVE NEGATIVE mg/dL   Protein, ur NEGATIVE NEGATIVE mg/dL   Nitrite NEGATIVE NEGATIVE   Leukocytes, UA LARGE (A) NEGATIVE   RBC / HPF 0-5 0 - 5 RBC/hpf   WBC, UA >50 (H) 0 - 5 WBC/hpf   Bacteria, UA MANY (A) NONE SEEN   Squamous Epithelial / LPF 0-5 0 - 5   WBC Clumps PRESENT    Mucus PRESENT    No results found for this or any previous visit (from the past 240 hour(s)). Creatinine: Recent Labs    05/04/18 1009  CREATININE 0.81   Baseline Creatinine: unknown  Impression/Assessment:  62yo with left hydronephrosis and concern for left ureteral injury  Plan:  The risks/benefits/alternatives to cystoscopy, bilateral retrograde pyelograms, left ureteral stent placement was explained to the patient and she understands and wishes to proceed with surgery  Nicolette Bang 05/04/2018, 2:17 PM

## 2018-05-04 NOTE — ED Provider Notes (Signed)
[facility] DEPT Provider Note   CSN: [id number] Arrival date & time: 05/04/18  0867     History   Chief Complaint Chief Complaint  Patient presents with  . Flank Pain  . Nausea    HPI Jacqueline Atkinson is a 62 y.o. female.  Patient is a 62 year old female who presents with flank pain.  She states that she had sudden onset of sharp pain in her left mid back that started about 4 AM this morning.  It now radiates to her left mid and lower abdomen.  She denies any known difficulty urinating or hematuria.  She had some nausea but no vomiting.  No fevers.  No change in bowel habits.  No history of similar symptoms in the past.  She did recently have bilateral oophorectomy.  She was previously receiving Lovenox for DVT prevention but has since completed the course.     Past Medical History:  Diagnosis Date  . Anemia    hx of  . Anxiety   . Arthritis    Left knee  osteoarthritis  . GERD (gastroesophageal reflux disease)   . Heart murmur   . History of hiatal hernia    small  . Hx of seasonal allergies    occ. use OTC Mucinex or Antihistamine.  . Hyperlipidemia   . Hypertension   . Neuromuscular disorder ([place])    "multi system atrophy disease"- "pain, bilateral foot numbness, left knee pain"- Dr. Carles Collet is following.  . Ovarian cancer on left ([place]) 2019   serous borderline s/p BSO  . Pneumonia   . Shoulder disorder    right shoulder "rigidity due to Multi system atrophy"- ROM not limited"just tires me"    Patient Active Problem List   Diagnosis Date Noted  . Intra-abdominal and pelvic swelling, mass and lump, unspecified site 04/02/2018  . Pelvic mass 03/14/2018  . Dyspnea on exertion 08/05/2017  . Hyperlipidemia 04/23/2016  . Arthritis 04/23/2016  . 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 Postoperative anemia due to acute blood loss 08/29/2015  . 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 Gait difficulty 12/29/2013  . Back pain  12/29/2013  . Paresthesia 12/19/2012  . Essential hypertension 11/28/2012  . Polypharmacy 11/28/2012  . GERD (gastroesophageal reflux disease) 11/28/2012    Past Surgical History:  Procedure Laterality Date  . ABDOMINAL HYSTERECTOMY     04-02-18 Dr. Gerarda Fraction  . DILATION AND CURETTAGE, DIAGNOSTIC / THERAPEUTIC     1981 and 1998 due to missed AB  . ESOPHAGOGASTRODUODENOSCOPY ENDOSCOPY     with esophageal dilation  . FOOT SURGERY  2011   3rd metatarsal LT foot  . KNEE ARTHROSCOPY Left 02/08/2015   Procedure: LEFT KNEE ARTHROSCOPY ;  Surgeon: Latanya Maudlin, MD;  Location: [facility];  Service: Orthopedics;  Laterality: Left;  . LAPAROTOMY N/A 04/02/2018   Procedure: EXPLORATORY LAPAROTOMY;  Surgeon: Isabel Caprice, MD;  Location: [facility];  Service: Gynecology;  Laterality: N/A;  . SALPINGOOPHORECTOMY Bilateral 04/02/2018   Procedure: BILATERAL SALPINGO OOPHORECTOMY;  Surgeon: Isabel Caprice, MD;  Location: [facility];  Service: Gynecology;  Laterality: Bilateral;  . TONSILLECTOMY AND ADENOIDECTOMY    . TOTAL KNEE ARTHROPLASTY Left 08/22/2015   Procedure: LEFT TOTAL  KNEE  ARTHROPLASTY;  Surgeon: Latanya Maudlin, MD;  Location: [facility];  Service: Orthopedics;  Laterality: Left;     OB History   None      Home Medications    Prior to Admission medications   Medication  Sig Start Date End Date Taking? Authorizing Provider  acetaminophen (TYLENOL) 500 MG tablet Take 1,000 mg by mouth every 8 (eight) hours as needed for moderate pain.    Yes [provider]  atenolol (TENORMIN) 100 MG tablet Take 1 tablet (100 mg total) by mouth daily. 03/13/18  Yes Shawnee Knapp, MD  carbidopa-levodopa (SINEMET CR) 50-200 MG tablet Take 1 tablet by mouth at bedtime. 03/05/18  Yes Tat, Eustace Quail, DO  carbidopa-levodopa (SINEMET IR) 25-100 MG tablet TAKE 1 TABLET BY MOUTH 6 TIMES A DAY and 1-2 PRN daily Patient taking differently: Take 1 tablet by mouth See admin instructions. TAKE 1 TABLET BY MOUTH 6 TIMES A DAY  01/28/18  Yes Tat, Eustace Quail, DO  Carboxymethylcellul-Glycerin (LUBRICATING EYE DROPS OP) Place 1 drop into both eyes 3 (three) times daily as needed (dry eyes).   Yes [provider]  clonazePAM (KLONOPIN) 0.5 MG tablet Take 1 tablet (0.5 mg total) by mouth at bedtime. 12/09/17  Yes Tat, Eustace Quail, DO  cloNIDine (CATAPRES) 0.2 MG tablet Take 1 tab po qam and 2 tabs po qhs Patient taking differently: Take 0.2 mg by mouth 2 (two) times daily.  03/13/18  Yes Shawnee Knapp, MD  hydrALAZINE (APRESOLINE) 50 MG tablet Take 1 tablet (50 mg total) by mouth 4 (four) times daily. As needed for elevated BP and take 1 po qhs Patient taking differently: Take 50 mg by mouth 2 (two) times daily. As needed for elevated BP and take 1 po qhs 03/13/18  Yes Shawnee Knapp, MD  mirabegron ER (MYRBETRIQ) 50 MG TB24 tablet Take 50 mg by mouth daily.   Yes [provider]  omeprazole (PRILOSEC) 20 MG capsule Take 20 mg by mouth 2 (two) times daily before a meal.   Yes [provider]  oxybutynin (DITROPAN) 5 MG tablet Take 5 mg by mouth 2 (two) times daily.  01/26/18  Yes [provider]  senna-docusate (SENOKOT S) 8.6-50 MG tablet Take 2 tablets by mouth at bedtime. 03/31/18  Yes Shawnee Knapp, MD  telmisartan (MICARDIS) 80 MG tablet Take 1 tablet (80 mg total) by mouth daily. 01/03/18  Yes Shawnee Knapp, MD  enoxaparin (LOVENOX) 40 MG/0.4ML injection Inject 0.4 mLs (40 mg total) into the skin daily. Patient not taking: Reported on 05/04/2018 04/06/18   Isabel Caprice, MD    Family History Family History  Problem Relation Age of Onset  . Diabetes Mother   . Kidney disease Mother   . Melanoma Mother        arm  . COPD Father   . Brain cancer Brother   . Melanoma Daughter     Social History Social History   Tobacco Use  . Smoking status: Former Smoker    Packs/day: 1.00    Years: 12.00    Pack years: 12.00    Last attempt to quit: 03/14/1988    Years since quitting: 30.1  . Smokeless  tobacco: Never Used  Substance Use Topics  . Alcohol use: No  . Drug use: No    Comment: edible ebrownies  months ago for pain     Allergies   Sulfa antibiotics   Review of Systems Review of Systems  Constitutional: Negative for chills, diaphoresis, fatigue and fever.  HENT: Negative for congestion, rhinorrhea and sneezing.   Eyes: Negative.   Respiratory: Negative for cough, chest tightness and shortness of breath.   Cardiovascular: Negative for chest pain and leg swelling.  Gastrointestinal:  Positive for abdominal pain and nausea. Negative for blood in stool, diarrhea and vomiting.  Genitourinary: Positive for flank pain. Negative for difficulty urinating, frequency and hematuria.  Musculoskeletal: Positive for back pain. Negative for arthralgias.  Skin: Negative for rash.  Neurological: Negative for dizziness, speech difficulty, weakness, numbness and headaches.     Physical Exam Updated Vital Signs BP (!) 168/95   Pulse 76   Temp 98.7 F (37.1 C) (Oral)   Resp 14   Ht 5\' 8"  (1.727 m)   Wt 113.4 kg (250 lb)   SpO2 95%   BMI 38.01 kg/m   Physical Exam  Constitutional: She is oriented to person, place, and time. She appears well-developed and well-nourished.  Appears uncomfortable from pain  HENT:  Head: Normocephalic and atraumatic.  Eyes: Pupils are equal, round, and reactive to light.  Neck: Normal range of motion. Neck supple.  Cardiovascular: Normal rate, regular rhythm and normal heart sounds.  Pulmonary/Chest: Effort normal and breath sounds normal. No respiratory distress. She has no wheezes. She has no rales. She exhibits no tenderness.  Abdominal: Soft. Bowel sounds are normal. There is tenderness. There is no rebound and no guarding.  Tenderness to the left mid abdomen and left flank, there is an area of ecchymosis over the left mid abdomen from her prior Lovenox injections  Musculoskeletal: Normal range of motion. She exhibits edema.  Lymphadenopathy:      She has no cervical adenopathy.  Neurological: She is alert and oriented to person, place, and time.  Skin: Skin is warm and dry. No rash noted.  Psychiatric: She has a normal mood and affect.     ED Treatments / Results  Labs (all labs ordered are listed, but only abnormal results are displayed) Labs Reviewed  BASIC METABOLIC PANEL - Abnormal; Notable for the following components:      Result Value   Glucose, Bld 113 (*)    All other components within normal limits  CBC WITH DIFFERENTIAL/PLATELET - Abnormal; Notable for the following components:   WBC 11.8 (*)    Neutro Abs 9.7 (*)    All other components within normal limits  URINALYSIS, ROUTINE W REFLEX MICROSCOPIC    EKG None  Radiology Ct Renal Stone Study  Result Date: 05/04/2018 CLINICAL DATA:  Acute left-sided abdominal pain. EXAM: CT ABDOMEN AND PELVIS WITHOUT CONTRAST TECHNIQUE: Multidetector CT imaging of the abdomen and pelvis was performed following the standard protocol without IV contrast. COMPARISON:  CT scan of October 02, 2017 and MRI scan of Mar 10, 2018. FINDINGS: Lower chest: Large hiatal hernia is noted. Visualized lung bases are unremarkable. Hepatobiliary: Minimal cholelithiasis is noted without inflammation. The liver is unremarkable on these unenhanced images. No biliary dilatation is noted. Pancreas: Unremarkable. No pancreatic ductal dilatation or surrounding inflammatory changes. Spleen: Normal in size without focal abnormality. Adrenals/Urinary Tract: Adrenal glands appear normal. Right kidney and ureter are unremarkable. Urinary bladder appears normal. Moderate left hydroureteronephrosis is noted. No obstructing calculus is noted. This may be due to possible external compression by 5.3 x 3.6 cm left retroperitoneal mass or complex fluid collection. Stomach/Bowel: Stomach is within normal limits. Appendix appears normal. No evidence of bowel wall thickening, distention, or inflammatory changes.  Vascular/Lymphatic: Mild atherosclerotic calcifications of abdominal aorta and iliac arteries are noted. As noted above, 5.3 x 3.6 cm rounded left retroperitoneal density is noted concerning for adenopathy or neoplasm, or possibly complex fluid collection. Reproductive: Status post hysterectomy and bilateral oophorectomy. Other: No abdominal wall hernia or  abnormality. No abdominopelvic ascites. Musculoskeletal: No acute or significant osseous findings. IMPRESSION: Status post recent hysterectomy and bilateral oophorectomy. There is interval development of 5.3 x 3.6 cm left retroperitoneal density which appears to be causing external compression of the left ureter, resulting in moderate left hydronephrosis. This density is concerning for possible metastatic adenopathy or neoplasm, or possibly complex fluid collection. Given the history of recent surgery, seroma cannot be excluded. Further evaluation with MRI with and without gadolinium administration is recommended. Large hiatal hernia. Minimal cholelithiasis without inflammation. Electronically Signed   By: Marijo Conception, M.D.   On: 05/04/2018 10:58    Procedures Procedures (including critical care time)  Medications Ordered in ED Medications  fentaNYL (SUBLIMAZE) injection 50 mcg (50 mcg Intravenous Given 05/04/18 1008)  ondansetron (ZOFRAN) injection 4 mg (4 mg Intravenous Given 05/04/18 1007)  morphine 4 MG/ML injection 4 mg (4 mg Intravenous Given 05/04/18 1109)     Initial Impression / Assessment and Plan / ED Course  I have reviewed the triage vital signs and the nursing notes.  Pertinent labs & imaging results that were available during my care of the patient were reviewed by me and considered in my medical decision making (see chart for details).     11:09 CT scan shows a complex fluid collection versus mass in the area of her recent abdominal surgery which is compressing the left ureter causing moderate hydronephrosis.  Patient did have a  hysterectomy/oophorectomy on June 6 by Dr. Gerarda Fraction with GYN/onc.  I have paged urology as patient may need a stent.  I also have paged Dr. Gerarda Fraction.  Dr. Gerarda Fraction will admit the patient for further work-up.  I did speak with Dr. Alyson Ingles with urology who will also see the patient for possible stent placement.  Patient and family updated.  Final Clinical Impressions(s) / ED Diagnoses   Final diagnoses:  Left lower quadrant abdominal mass  Obstruction of left ureter    ED Discharge Orders    None       Malvin Johns, MD 05/04/18 1143

## 2018-05-05 ENCOUNTER — Encounter (HOSPITAL_BASED_OUTPATIENT_CLINIC_OR_DEPARTMENT_OTHER): Payer: Self-pay | Admitting: Urology

## 2018-05-05 DIAGNOSIS — N132 Hydronephrosis with renal and ureteral calculous obstruction: Secondary | ICD-10-CM | POA: Diagnosis not present

## 2018-05-05 DIAGNOSIS — R1032 Left lower quadrant pain: Secondary | ICD-10-CM | POA: Diagnosis not present

## 2018-05-05 LAB — CBC WITH DIFFERENTIAL/PLATELET
Basophils Absolute: 0 10*3/uL (ref 0.0–0.1)
Basophils Relative: 0 %
Eosinophils Absolute: 0 10*3/uL (ref 0.0–0.7)
Eosinophils Relative: 0 %
HEMATOCRIT: 35.1 % — AB (ref 36.0–46.0)
HEMOGLOBIN: 11.4 g/dL — AB (ref 12.0–15.0)
Lymphocytes Relative: 5 %
Lymphs Abs: 0.9 10*3/uL (ref 0.7–4.0)
MCH: 29.1 pg (ref 26.0–34.0)
MCHC: 32.5 g/dL (ref 30.0–36.0)
MCV: 89.5 fL (ref 78.0–100.0)
MONOS PCT: 6 %
Monocytes Absolute: 1.1 10*3/uL — ABNORMAL HIGH (ref 0.1–1.0)
NEUTROS ABS: 16.7 10*3/uL — AB (ref 1.7–7.7)
NEUTROS PCT: 89 %
Platelets: 235 10*3/uL (ref 150–400)
RBC: 3.92 MIL/uL (ref 3.87–5.11)
RDW: 14.4 % (ref 11.5–15.5)
WBC: 18.7 10*3/uL — ABNORMAL HIGH (ref 4.0–10.5)

## 2018-05-05 LAB — COMPREHENSIVE METABOLIC PANEL
ALBUMIN: 3.6 g/dL (ref 3.5–5.0)
ALK PHOS: 98 U/L (ref 38–126)
ALT: 6 U/L (ref 0–44)
ANION GAP: 8 (ref 5–15)
AST: 22 U/L (ref 15–41)
BILIRUBIN TOTAL: 0.4 mg/dL (ref 0.3–1.2)
BUN: 12 mg/dL (ref 8–23)
CALCIUM: 8.7 mg/dL — AB (ref 8.9–10.3)
CO2: 27 mmol/L (ref 22–32)
CREATININE: 0.94 mg/dL (ref 0.44–1.00)
Chloride: 105 mmol/L (ref 98–111)
GFR calc non Af Amer: >60 mL/min (ref >60)
GLUCOSE: 116 mg/dL — AB (ref 70–99)
Potassium: 4.1 mmol/L (ref 3.5–5.1)
Sodium: 140 mmol/L (ref 135–145)
Total Protein: 6.4 g/dL — ABNORMAL LOW (ref 6.5–8.1)

## 2018-05-05 MED ORDER — CEFPODOXIME PROXETIL 200 MG PO TABS: 200.0000 mg | ORAL_TABLET | Freq: Two times a day (BID) | ORAL | 0 refills | Status: AC

## 2018-05-05 MED ORDER — SODIUM CHLORIDE 0.9 % IV SOLN
2.0000 g | INTRAVENOUS | Status: DC
  Administered 2018-05-05: 2 g via INTRAVENOUS
  Filled 2018-05-05: qty 2

## 2018-05-05 NOTE — Discharge Summary (Signed)
Physician Discharge Summary  Patient ID: Jacqueline Atkinson MRN: [id number] DOB/AGE: 1956-03-02 62 y.o.  Admit date: 05/04/2018 Discharge date: 05/05/2018  Admission Diagnoses: <principal problem not specified>  Hydronephrosis, flank pain, nausea  Discharge Diagnoses:  Active Problems:   Hydronephrosis   Flank pain Pyelonephritis  Discharged Condition: stable  Hospital Course:  1/ patient was admitted on 05/04/18 for pain and findings of hydronephrosis for ureteral stent placement. She felt to have pyelonephritis after the stent placement. 2/ surgery was uncomplicated  3/ on postoperative day 1 the patient was meeting discharge criteria: tolerating PO, voiding urine, ambulating, pain well controlled on oral medications 4/ new medications on discharge include Antibiotic per urology.    Consults: urology  Significant Diagnostic Studies: CT done in ER.   Treatments: antibiotics: and surgery:   Discharge Exam: Blood pressure 127/72, pulse 66, temperature 98.4 F (36.9 C), temperature source Oral, resp. rate 18, height 5\' 8"  (1.727 m), weight 250 lb (113.4 kg), SpO2 94 %. General appearance: alert, cooperative and no distress Resp: nonlabored  GI: soft, NTND. Wounds CDI. Extremities: extremities normal, atraumatic, no cyanosis or edema Incision/Wound:CDI  Disposition: Discharge disposition: 01-Home or Self Care       Discharge Instructions    Call MD for:  persistant nausea and vomiting   Complete by:  As directed    Call MD for:  temperature >100.4   Complete by:  As directed    Diet - low sodium heart healthy   Complete by:  As directed    Increase activity slowly   Complete by:  As directed      Allergies as of 05/05/2018      Reactions   Sulfa Antibiotics    Long time ago and does not remember reaction      Medication List    STOP taking these medications   enoxaparin 40 MG/0.4ML injection Commonly known as:  LOVENOX     TAKE these medications    acetaminophen 500 MG tablet Commonly known as:  TYLENOL Take 1,000 mg by mouth every 8 (eight) hours as needed for moderate pain.   atenolol 100 MG tablet Commonly known as:  TENORMIN Take 1 tablet (100 mg total) by mouth daily.   carbidopa-levodopa 25-100 MG tablet Commonly known as:  SINEMET IR TAKE 1 TABLET BY MOUTH 6 TIMES A DAY and 1-2 PRN daily What changed:    how much to take  how to take this  when to take this  additional instructions   carbidopa-levodopa 50-200 MG tablet Commonly known as:  SINEMET CR Take 1 tablet by mouth at bedtime. What changed:  Another medication with the same name was changed. Make sure you understand how and when to take each.   cefpodoxime 200 MG tablet Commonly known as:  VANTIN Take 1 tablet (200 mg total) by mouth 2 (two) times daily for 14 days.   clonazePAM 0.5 MG tablet Commonly known as:  KLONOPIN Take 1 tablet (0.5 mg total) by mouth at bedtime.   cloNIDine 0.2 MG tablet Commonly known as:  CATAPRES Take 1 tab po qam and 2 tabs po qhs What changed:    how much to take  how to take this  when to take this  additional instructions   hydrALAZINE 50 MG tablet Commonly known as:  APRESOLINE Take 1 tablet (50 mg total) by mouth 4 (four) times daily. As needed for elevated BP and take 1 po qhs What changed:    when to take this  additional instructions   LUBRICATING EYE DROPS OP Place 1 drop into both eyes 3 (three) times daily as needed (dry eyes).   MYRBETRIQ 50 MG Tb24 tablet Generic drug:  mirabegron ER Take 50 mg by mouth daily.   omeprazole 20 MG capsule Commonly known as:  PRILOSEC Take 20 mg by mouth 2 (two) times daily before a meal.   oxybutynin 5 MG tablet Commonly known as:  DITROPAN Take 5 mg by mouth 2 (two) times daily.   senna-docusate 8.6-50 MG tablet Commonly known as:  SENOKOT S Take 2 tablets by mouth at bedtime.   telmisartan 80 MG tablet Commonly known as:  MICARDIS Take 1 tablet  (80 mg total) by mouth daily.      Follow-up Information    Isabel Caprice, MD Follow up in 1 month(s).   Specialty:  Gynecologic Oncology Contact information: [name] [address] [phone]        Cleon Gustin, MD. Schedule an appointment as soon as possible for a visit.   Specialty:  Urology Why:  Dr. Alyson Ingles wants to see you in ~71month to see about removing the stent.  Contact information: [place] [address] [phone]           Signed: Isabel Caprice 05/05/2018, 12:32 PM

## 2018-05-05 NOTE — Progress Notes (Signed)
Pt remains a&ox4, ambulatory with minimal assistance. Discharge instructions reviewed, questions,concerns denied. No change from am assessment.

## 2018-05-05 NOTE — Anesthesia Postprocedure Evaluation (Signed)
Anesthesia Post Note  Patient: Jacqueline Atkinson  Procedure(s) Performed: CYSTOSCOPY WITH RETROGRADE PYELOGRAM, URETEROSCOPY AND STENT PLACEMENT (Bilateral )     Patient location during evaluation: PACU Anesthesia Type: MAC Level of consciousness: awake Pain management: pain level controlled Vital Signs Assessment: post-procedure vital signs reviewed and stable Respiratory status: spontaneous breathing Cardiovascular status: stable Postop Assessment: no apparent nausea or vomiting Anesthetic complications: no    Last Vitals:  Vitals:   05/04/18 2038 05/05/18 1022  BP: (!) 155/95 127/72  Pulse: 90 66  Resp: 18   Temp: 36.9 C   SpO2: 97% 94%    Last Pain:  Vitals:   05/04/18 2123  TempSrc:   PainSc: 0-No pain   Pain Goal: Patients Stated Pain Goal: 5 (05/04/18 1434)               Franquez

## 2018-05-05 NOTE — Progress Notes (Signed)
1 Day Post-Op Subjective: Patient reports  Resolution of left flank pain. WBC count increased to 18 today. Fever 100 last night.  Objective: Vital signs in last 24 hours: Temp:  [98.4 F (36.9 C)-100.1 F (37.8 C)] 98.4 F (36.9 C) (07/08 2038) Pulse Rate:  [72-107] 90 (07/08 2038) Resp:  [14-32] 18 (07/08 2038) BP: (155-197)/(83-127) 155/95 (07/08 2038) SpO2:  [90 %-100 %] 97 % (07/08 2038) Weight:  [113.4 kg (250 lb)] 113.4 kg (250 lb) (07/08 1357)  Intake/Output from previous day: 07/08 0701 - 07/09 0700 In: 1104.2 [I.V.:1004.2; IV Piggyback:100] Out: 165 [Urine:100; Emesis/NG output:60; Blood:5] Intake/Output this shift: No intake/output data recorded.  Physical Exam:  General:alert, cooperative and appears stated age GI: soft, non tender, normal bowel sounds, no palpable masses, no organomegaly, no inguinal hernia Female genitalia: not done Extremities: extremities normal, atraumatic, no cyanosis or edema  Lab Results: Recent Labs    05/04/18 1009 05/05/18 0417  HGB 12.8 11.4*  HCT 39.2 35.1*   BMET Recent Labs    05/04/18 1009 05/05/18 0417  NA 142 140  K 4.1 4.1  CL 106 105  CO2 26 27  GLUCOSE 113* 116*  BUN 11 12  CREATININE 0.81 0.94  CALCIUM 9.6 8.7*   No results for input(s): LABPT, INR in the last 72 hours. No results for input(s): LABURIN in the last 72 hours. Results for orders placed or performed during the hospital encounter of 04/02/18  Urine Culture     Status: Abnormal   Collection Time: 03/26/18 11:39 AM  Result Value Ref Range Status   Specimen Description   Final    URINE, RANDOM Performed at [facility], [place] [address], [place], [place] [address]    Special Requests   Final    NONE Performed at [facility], [place] [address] [place], [place] [address]    Culture MULTIPLE SPECIES PRESENT, SUGGEST RECOLLECTION (A)  Final   Report Status 03/27/2018 FINAL  Final  Urine culture     Status:  Abnormal   Collection Time: 04/02/18  6:13 PM  Result Value Ref Range Status   Specimen Description   Final    URINE, RANDOM Performed at [facility] [address], [place], [place] [address]    Special Requests   Final    NONE Performed at [facility], [place] [address] [place], [place] [address]    Culture (A)  Final    <10,000 COLONIES/mL INSIGNIFICANT GROWTH Performed at [facility] [address], [place], [place] [address]    Report Status 04/04/2018 FINAL  Final    Studies/Results: Ct Renal Stone Study  Result Date: 05/04/2018 CLINICAL DATA:  Acute left-sided abdominal pain. EXAM: CT ABDOMEN AND PELVIS WITHOUT CONTRAST TECHNIQUE: Multidetector CT imaging of the abdomen and pelvis was performed following the standard protocol without IV contrast. COMPARISON:  CT scan of October 02, 2017 and MRI scan of Mar 10, 2018. FINDINGS: Lower chest: Large hiatal hernia is noted. Visualized lung bases are unremarkable. Hepatobiliary: Minimal cholelithiasis is noted without inflammation. The liver is unremarkable on these unenhanced images. No biliary dilatation is noted. Pancreas: Unremarkable. No pancreatic ductal dilatation or surrounding inflammatory changes. Spleen: Normal in size without focal abnormality. Adrenals/Urinary Tract: Adrenal glands appear normal. Right kidney and ureter are unremarkable. Urinary bladder appears normal. Moderate left hydroureteronephrosis is noted. No obstructing calculus is noted. This may be due to possible external compression by 5.3 x 3.6 cm left retroperitoneal  mass or complex fluid collection. Stomach/Bowel: Stomach is within normal limits. Appendix appears normal. No evidence of bowel wall thickening, distention, or inflammatory changes. Vascular/Lymphatic: Mild atherosclerotic calcifications of abdominal aorta and iliac arteries are noted. As noted above, 5.3 x 3.6 cm rounded left retroperitoneal density is noted  concerning for adenopathy or neoplasm, or possibly complex fluid collection. Reproductive: Status post hysterectomy and bilateral oophorectomy. Other: No abdominal wall hernia or abnormality. No abdominopelvic ascites. Musculoskeletal: No acute or significant osseous findings. IMPRESSION: Status post recent hysterectomy and bilateral oophorectomy. There is interval development of 5.3 x 3.6 cm left retroperitoneal density which appears to be causing external compression of the left ureter, resulting in moderate left hydronephrosis. This density is concerning for possible metastatic adenopathy or neoplasm, or possibly complex fluid collection. Given the history of recent surgery, seroma cannot be excluded. Further evaluation with MRI with and without gadolinium administration is recommended. Large hiatal hernia. Minimal cholelithiasis without inflammation. Electronically Signed   By: Marijo Conception, M.D.   On: 05/04/2018 10:58    Assessment/Plan: 62yo with left ureteral obstruction and pyelonephritis 1. Rocephin 2g given today. The patient should be discharged on vantin 200mg  BID for 14 days 2. Left ureteral obstruction: I discussed the management with the patient. We will have her followup in my office in 4 weeks with a CT stone study. If the fluid collection has resolved we will remove the stent at that time   LOS: 0 days   Nicolette Bang 05/05/2018, 8:26 AM

## 2018-05-05 NOTE — Discharge Instructions (Signed)
Complete your antibiotics. Call Dr. Alyson Ingles if any issues filling the prescription for alternative.

## 2018-05-06 ENCOUNTER — Emergency Department (HOSPITAL_COMMUNITY)
Admission: EM | Admit: 2018-05-06 | Discharge: 2018-05-06 | Disposition: A | Discharge status: Home / Self Care | Payer: Medicare Other | Attending: Emergency Medicine | Admitting: Emergency Medicine

## 2018-05-06 ENCOUNTER — Encounter (HOSPITAL_COMMUNITY): Payer: Self-pay | Admitting: *Deleted

## 2018-05-06 ENCOUNTER — Encounter (HOSPITAL_COMMUNITY): Payer: Self-pay

## 2018-05-06 ENCOUNTER — Emergency Department (HOSPITAL_COMMUNITY): Payer: Medicare Other

## 2018-05-06 ENCOUNTER — Emergency Department (HOSPITAL_COMMUNITY)
Admission: EM | Admit: 2018-05-06 | Discharge: 2018-05-06 | Disposition: A | Discharge status: Home / Self Care | Payer: Medicare Other | Source: Home / Self Care | Attending: Emergency Medicine | Admitting: Emergency Medicine

## 2018-05-06 ENCOUNTER — Other Ambulatory Visit: Payer: Self-pay

## 2018-05-06 DIAGNOSIS — R109 Unspecified abdominal pain: Secondary | ICD-10-CM | POA: Insufficient documentation

## 2018-05-06 DIAGNOSIS — R1032 Left lower quadrant pain: Secondary | ICD-10-CM | POA: Insufficient documentation

## 2018-05-06 DIAGNOSIS — Z79899 Other long term (current) drug therapy: Secondary | ICD-10-CM

## 2018-05-06 DIAGNOSIS — Z5321 Procedure and treatment not carried out due to patient leaving prior to being seen by health care provider: Secondary | ICD-10-CM | POA: Insufficient documentation

## 2018-05-06 DIAGNOSIS — I10 Essential (primary) hypertension: Secondary | ICD-10-CM

## 2018-05-06 DIAGNOSIS — T8389XA Other specified complication of genitourinary prosthetic devices, implants and grafts, initial encounter: Secondary | ICD-10-CM | POA: Diagnosis not present

## 2018-05-06 DIAGNOSIS — Z87891 Personal history of nicotine dependence: Secondary | ICD-10-CM | POA: Insufficient documentation

## 2018-05-06 LAB — URINALYSIS, ROUTINE W REFLEX MICROSCOPIC
Bilirubin Urine: NEGATIVE
Glucose, UA: NEGATIVE mg/dL
Ketones, ur: NEGATIVE mg/dL
Nitrite: NEGATIVE
PROTEIN: 30 mg/dL — AB
Specific Gravity, Urine: 1.006 (ref 1.005–1.030)
pH: 7 (ref 5.0–8.0)

## 2018-05-06 LAB — CBC
HCT: 35.8 % — ABNORMAL LOW (ref 36.0–46.0)
HEMOGLOBIN: 11.4 g/dL — AB (ref 12.0–15.0)
MCH: 28.6 pg (ref 26.0–34.0)
MCHC: 31.8 g/dL (ref 30.0–36.0)
MCV: 89.9 fL (ref 78.0–100.0)
Platelets: 187 10*3/uL (ref 150–400)
RBC: 3.98 MIL/uL (ref 3.87–5.11)
RDW: 14 % (ref 11.5–15.5)
WBC: 11.1 10*3/uL — ABNORMAL HIGH (ref 4.0–10.5)

## 2018-05-06 LAB — COMPREHENSIVE METABOLIC PANEL
ALBUMIN: 3.9 g/dL (ref 3.5–5.0)
ALK PHOS: 104 U/L (ref 38–126)
ALT: <5 U/L (ref 0–44)
ANION GAP: 11 (ref 5–15)
AST: 30 U/L (ref 15–41)
BUN: 11 mg/dL (ref 8–23)
CALCIUM: 9.1 mg/dL (ref 8.9–10.3)
CO2: 26 mmol/L (ref 22–32)
Chloride: 103 mmol/L (ref 98–111)
Creatinine, Ser: 0.88 mg/dL (ref 0.44–1.00)
GFR calc Af Amer: >60 mL/min (ref >60)
GFR calc non Af Amer: >60 mL/min (ref >60)
GLUCOSE: 118 mg/dL — AB (ref 70–99)
Potassium: 4 mmol/L (ref 3.5–5.1)
SODIUM: 140 mmol/L (ref 135–145)
Total Bilirubin: 0.5 mg/dL (ref 0.3–1.2)
Total Protein: 6.5 g/dL (ref 6.5–8.1)

## 2018-05-06 LAB — LIPASE, BLOOD: Lipase: 27 U/L (ref 11–51)

## 2018-05-06 MED ORDER — HYDROMORPHONE HCL 1 MG/ML IJ SOLN
1.0000 mg | Freq: Once | INTRAMUSCULAR | Status: AC
  Administered 2018-05-06: 1 mg via INTRAVENOUS

## 2018-05-06 MED ORDER — OXYCODONE-ACETAMINOPHEN 5-325 MG PO TABS: 1.0000 | ORAL_TABLET | ORAL | 0 refills | Status: DC | PRN

## 2018-05-06 MED ORDER — ONDANSETRON HCL 4 MG PO TABS: 4.0000 mg | ORAL_TABLET | Freq: Four times a day (QID) | ORAL | 0 refills | Status: DC

## 2018-05-06 MED ORDER — ONDANSETRON HCL 4 MG/2ML IJ SOLN
4.0000 mg | Freq: Once | INTRAMUSCULAR | Status: AC
  Administered 2018-05-06: 4 mg via INTRAVENOUS

## 2018-05-06 NOTE — ED Provider Notes (Signed)
[facility] EMERGENCY DEPARTMENT Provider Note   CSN: [id number] Arrival date & time: 05/06/18  0326     History   Chief Complaint Chief Complaint  Patient presents with  . Abdominal Pain  . Flank Pain    HPI Jacqueline Atkinson is a 62 y.o. female.  Flank pain.  Patient reports that she had a ureteral stent placed 2 days ago for hydronephrosis.  Patient reports that she had cancer surgery 1 month ago and it was felt that there was some fluid collection in the area of the surgery that was causing hydronephrosis.  She was hospitalized briefly, had a stent placement and reports that her flank pain resolved.  Overnight tonight, however, she started having severe, sharp and stabbing pain with nausea, similar to her original pain.     Past Medical History:  Diagnosis Date  . Anemia    hx of  . Anxiety   . Arthritis    Left knee  osteoarthritis  . GERD (gastroesophageal reflux disease)   . Heart murmur   . History of hiatal hernia    small  . Hx of seasonal allergies    occ. use OTC Mucinex or Antihistamine.  . Hyperlipidemia   . Hypertension   . Neuromuscular disorder ([place])    "multi system atrophy disease"- "pain, bilateral foot numbness, left knee pain"- Dr. Carles Collet is following.  . Ovarian cancer on left ([place]) 2019   serous borderline s/p BSO  . Pneumonia   . Shoulder disorder    right shoulder "rigidity due to Multi system atrophy"- ROM not limited"just tires me"    Patient Active Problem List   Diagnosis Date Noted  . Hydronephrosis 05/04/2018  . Flank pain 05/04/2018  . Intra-abdominal and pelvic swelling, mass and lump, unspecified site 04/02/2018  . Pelvic mass 03/14/2018  . Dyspnea on exertion 08/05/2017  . Hyperlipidemia 04/23/2016  . Arthritis 04/23/2016  . 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 Postoperative anemia due to acute blood loss 08/29/2015  . 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 Gait difficulty 12/29/2013  .  Back pain 12/29/2013  . Paresthesia 12/19/2012  . Essential hypertension 11/28/2012  . Polypharmacy 11/28/2012  . GERD (gastroesophageal reflux disease) 11/28/2012    Past Surgical History:  Procedure Laterality Date  . ABDOMINAL HYSTERECTOMY     04-02-18 Dr. Gerarda Fraction  . CYSTOSCOPY WITH RETROGRADE PYELOGRAM, URETEROSCOPY AND STENT PLACEMENT Bilateral 05/04/2018   Procedure: CYSTOSCOPY WITH RETROGRADE PYELOGRAM, URETEROSCOPY AND STENT PLACEMENT;  Surgeon: Cleon Gustin, MD;  Location: [facility];  Service: Urology;  Laterality: Bilateral;  . DILATION AND CURETTAGE, DIAGNOSTIC / THERAPEUTIC     1981 and 1998 due to missed AB  . ESOPHAGOGASTRODUODENOSCOPY ENDOSCOPY     with esophageal dilation  . FOOT SURGERY  2011   3rd metatarsal LT foot  . KNEE ARTHROSCOPY Left 02/08/2015   Procedure: LEFT KNEE ARTHROSCOPY ;  Surgeon: Latanya Maudlin, MD;  Location: [facility];  Service: Orthopedics;  Laterality: Left;  . LAPAROTOMY N/A 04/02/2018   Procedure: EXPLORATORY LAPAROTOMY;  Surgeon: Isabel Caprice, MD;  Location: [facility];  Service: Gynecology;  Laterality: N/A;  . SALPINGOOPHORECTOMY Bilateral 04/02/2018   Procedure: BILATERAL SALPINGO OOPHORECTOMY;  Surgeon: Isabel Caprice, MD;  Location: [facility];  Service: Gynecology;  Laterality: Bilateral;  . TONSILLECTOMY AND ADENOIDECTOMY    . TOTAL KNEE ARTHROPLASTY Left 08/22/2015   Procedure: LEFT TOTAL  KNEE  ARTHROPLASTY;  Surgeon: Latanya Maudlin, MD;  Location:  [facility];  Service: Orthopedics;  Laterality: Left;     OB History   None      Home Medications    Prior to Admission medications   Medication Sig Start Date End Date Taking? Authorizing Provider  acetaminophen (TYLENOL) 500 MG tablet Take 1,000 mg by mouth every 8 (eight) hours as needed for moderate pain.    Yes [provider]  atenolol (TENORMIN) 100 MG tablet Take 1 tablet (100 mg total) by mouth daily. 03/13/18  Yes Shawnee Knapp, MD  carbidopa-levodopa  (SINEMET CR) 50-200 MG tablet Take 1 tablet by mouth at bedtime. 03/05/18  Yes Tat, Eustace Quail, DO  carbidopa-levodopa (SINEMET IR) 25-100 MG tablet TAKE 1 TABLET BY MOUTH 6 TIMES A DAY and 1-2 PRN daily Patient taking differently: Take 1 tablet by mouth See admin instructions. Take 1 tablet by mouth six times daily 01/28/18  Yes Tat, Eustace Quail, DO  Carboxymethylcellul-Glycerin (LUBRICATING EYE DROPS OP) Place 1 drop into both eyes 3 (three) times daily as needed (dry eyes).   Yes [provider]  clonazePAM (KLONOPIN) 0.5 MG tablet Take 1 tablet (0.5 mg total) by mouth at bedtime. 12/09/17  Yes Tat, Eustace Quail, DO  cloNIDine (CATAPRES) 0.2 MG tablet Take 1 tab po qam and 2 tabs po qhs Patient taking differently: Take 0.2 mg by mouth 2 (two) times daily.  03/13/18  Yes Shawnee Knapp, MD  hydrALAZINE (APRESOLINE) 50 MG tablet Take 1 tablet (50 mg total) by mouth 4 (four) times daily. As needed for elevated BP and take 1 po qhs Patient taking differently: Take 50 mg by mouth 2 (two) times daily. As needed for elevated BP and take 1 po qhs 03/13/18  Yes Shawnee Knapp, MD  mirabegron ER (MYRBETRIQ) 50 MG TB24 tablet Take 50 mg by mouth daily.   Yes [provider]  omeprazole (PRILOSEC) 20 MG capsule Take 20 mg by mouth 2 (two) times daily before a meal.   Yes [provider]  oxybutynin (DITROPAN) 5 MG tablet Take 5 mg by mouth 2 (two) times daily.  01/26/18  Yes [provider]  senna-docusate (SENOKOT S) 8.6-50 MG tablet Take 2 tablets by mouth at bedtime. Patient taking differently: Take 2 tablets by mouth daily.  03/31/18  Yes Shawnee Knapp, MD  telmisartan (MICARDIS) 80 MG tablet Take 1 tablet (80 mg total) by mouth daily. 01/03/18  Yes Shawnee Knapp, MD  cefpodoxime (VANTIN) 200 MG tablet Take 1 tablet (200 mg total) by mouth 2 (two) times daily for 14 days. Patient not taking: Reported on 05/06/2018 05/05/18 05/19/18  Isabel Caprice, MD  ondansetron (ZOFRAN) 4 MG tablet Take 1 tablet  (4 mg total) by mouth every 6 (six) hours. 05/06/18   Orpah Greek, MD  oxyCODONE-acetaminophen (PERCOCET) 5-325 MG tablet Take 1 tablet by mouth every 4 (four) hours as needed. 05/06/18   Orpah Greek, MD    Family History Family History  Problem Relation Age of Onset  . Diabetes Mother   . Kidney disease Mother   . Melanoma Mother        arm  . COPD Father   . Brain cancer Brother   . Melanoma Daughter     Social History Social History   Tobacco Use  . Smoking status: Former Smoker    Packs/day: 1.00    Years: 12.00    Pack years: 12.00    Last attempt to quit: 03/14/1988  Years since quitting: 30.1  . Smokeless tobacco: Never Used  Substance Use Topics  . Alcohol use: No  . Drug use: No    Comment: edible ebrownies  months ago for pain     Allergies   Sulfa antibiotics   Review of Systems Review of Systems  Gastrointestinal: Positive for nausea.  Genitourinary: Positive for flank pain.  All other systems reviewed and are negative.    Physical Exam Updated Vital Signs BP 119/67   Pulse (!) 59   Temp 97.9 F (36.6 C) (Oral)   Resp 17   SpO2 91%   Physical Exam  Constitutional: She is oriented to person, place, and time. She appears well-developed and well-nourished. No distress.  HENT:  Head: Normocephalic and atraumatic.  Right Ear: Hearing normal.  Left Ear: Hearing normal.  Nose: Nose normal.  Mouth/Throat: Oropharynx is clear and moist and mucous membranes are normal.  Eyes: Pupils are equal, round, and reactive to light. Conjunctivae and EOM are normal.  Neck: Normal range of motion. Neck supple.  Cardiovascular: Regular rhythm, S1 normal and S2 normal. Exam reveals no gallop and no friction rub.  No murmur heard. Pulmonary/Chest: Effort normal and breath sounds normal. No respiratory distress. She exhibits no tenderness.  Abdominal: Soft. Normal appearance and bowel sounds are normal. There is no hepatosplenomegaly. There  is no tenderness. There is CVA tenderness. There is no rebound, no guarding, no tenderness at McBurney's point and negative Murphy's sign. No hernia.  Musculoskeletal: Normal range of motion.  Neurological: She is alert and oriented to person, place, and time. She has normal strength. No cranial nerve deficit or sensory deficit. Coordination normal. GCS eye subscore is 4. GCS verbal subscore is 5. GCS motor subscore is 6.  Skin: Skin is warm, dry and intact. No rash noted. No cyanosis.  Psychiatric: She has a normal mood and affect. Her speech is normal and behavior is normal. Thought content normal.  Nursing note and vitals reviewed.    ED Treatments / Results  Labs (all labs ordered are listed, but only abnormal results are displayed) Labs Reviewed  COMPREHENSIVE METABOLIC PANEL - Abnormal; Notable for the following components:      Result Value   Glucose, Bld 118 (*)    All other components within normal limits  CBC - Abnormal; Notable for the following components:   WBC 11.1 (*)    Hemoglobin 11.4 (*)    HCT 35.8 (*)    All other components within normal limits  URINALYSIS, ROUTINE W REFLEX MICROSCOPIC - Abnormal; Notable for the following components:   Hgb urine dipstick LARGE (*)    Protein, ur 30 (*)    Leukocytes, UA SMALL (*)    Bacteria, UA RARE (*)    All other components within normal limits  LIPASE, BLOOD    EKG None  Radiology US Renal  Result Date: 05/06/2018 CLINICAL DATA:  Worsening pain after stent placement May 04, 2018 EXAM: RENAL / URINARY TRACT ULTRASOUND COMPLETE COMPARISON:  CT abdomen and pelvis May 04, 2018 FINDINGS: Right Kidney: Length: 11.7 cm. Echogenicity within normal limits. No mass or hydronephrosis visualized. Left Kidney: Length: 12.5 cm. Echogenicity within normal limits. No mass or hydronephrosis visualized. No sonographically identified stent. Bladder: Appears normal for degree of bladder distention. IMPRESSION: Negative renal ultrasound.  Electronically Signed   By: Elon Alas M.D.   On: 05/06/2018 05:26    Procedures Procedures (including critical care time)  Medications Ordered in ED Medications  HYDROmorphone (DILAUDID)  injection 1 mg (1 mg Intravenous Given 05/06/18 0438)  ondansetron (ZOFRAN) injection 4 mg (4 mg Intravenous Given 05/06/18 0438)     Initial Impression / Assessment and Plan / ED Course  I have reviewed the triage vital signs and the nursing notes.  Pertinent labs & imaging results that were available during my care of the patient were reviewed by me and considered in my medical decision making (see chart for details).     Patient recently underwent ureteral stenting because of hydronephrosis secondary to external compression on her ureter.  She has started to have severe flank pain once again.  She was told she may have an infection when she had her stent placed, however, culture did not grow pathogens.  Ultrasound today does not show any hydronephrosis, stent is functional.  Patient has had significant improvement with pain medication.  She was counseled to continue her antibiotic, follow-up with urology.  Final Clinical Impressions(s) / ED Diagnoses   Final diagnoses:  Flank pain    ED Discharge Orders        Ordered    oxyCODONE-acetaminophen (PERCOCET) 5-325 MG tablet  Every 4 hours PRN     05/06/18 0713    ondansetron (ZOFRAN) 4 MG tablet  Every 6 hours     05/06/18 0713       Orpah Greek, MD 05/08/18 [phone]

## 2018-05-06 NOTE — ED Notes (Signed)
Pt. Seen leaving the room in a Wheelchair escorted by family.

## 2018-05-06 NOTE — ED Triage Notes (Signed)
Pt states that she just LWBS at [facility] due to wait times, had stent placement yesterday, today L sided flank pain became worse, radiates to lower abdomen, pt also had incontinence episode, denies hematuria.

## 2018-05-06 NOTE — ED Notes (Addendum)
Pt left without being seen by doctor or signing [place]

## 2018-05-06 NOTE — ED Notes (Signed)
Patient transported to Ultrasound 

## 2018-05-06 NOTE — ED Triage Notes (Signed)
Pt states she woke up with intense left lower abdominal pain, nausea and urinary incontinence. Pt had ureteral stent placement yesterday. Denies fevers. No meds PTA.

## 2018-05-06 NOTE — ED Notes (Signed)
Pt's family told this RN that they were going to see how long the wait at [facility] was and leave if they were not seen soon.

## 2018-05-06 NOTE — ED Notes (Signed)
Pt. Was seen ambulating to bathroom and refused wheelchair.

## 2018-05-08 ENCOUNTER — Ambulatory Visit: Payer: Medicare Other | Admitting: Obstetrics

## 2018-05-24 ENCOUNTER — Other Ambulatory Visit: Payer: Self-pay | Admitting: Neurology

## 2018-05-25 ENCOUNTER — Telehealth: Payer: Self-pay | Admitting: *Deleted

## 2018-05-25 NOTE — Telephone Encounter (Signed)
Patient called and left a message regarding an appt. Called the patient back regarding her appt. Patient thought she had an appt already scheduled. Patient/provider canceled appt for July 12. Patient given multiple dates to reschedule, she will call back after speaking with her daughter

## 2018-05-28 ENCOUNTER — Encounter: Payer: Self-pay | Admitting: Physical Therapy

## 2018-05-28 ENCOUNTER — Ambulatory Visit: Payer: Medicare Other

## 2018-05-28 ENCOUNTER — Ambulatory Visit: Payer: Medicare Other | Attending: Neurology | Admitting: Physical Therapy

## 2018-05-28 DIAGNOSIS — R278 Other lack of coordination: Secondary | ICD-10-CM | POA: Diagnosis not present

## 2018-05-28 DIAGNOSIS — R293 Abnormal posture: Secondary | ICD-10-CM | POA: Insufficient documentation

## 2018-05-28 DIAGNOSIS — R1310 Dysphagia, unspecified: Secondary | ICD-10-CM

## 2018-05-28 DIAGNOSIS — R29818 Other symptoms and signs involving the nervous system: Secondary | ICD-10-CM | POA: Insufficient documentation

## 2018-05-28 DIAGNOSIS — R4701 Aphasia: Secondary | ICD-10-CM | POA: Insufficient documentation

## 2018-05-28 DIAGNOSIS — R41841 Cognitive communication deficit: Secondary | ICD-10-CM

## 2018-05-28 DIAGNOSIS — R2689 Other abnormalities of gait and mobility: Secondary | ICD-10-CM | POA: Insufficient documentation

## 2018-05-28 DIAGNOSIS — R2681 Unsteadiness on feet: Secondary | ICD-10-CM | POA: Insufficient documentation

## 2018-05-28 DIAGNOSIS — R29898 Other symptoms and signs involving the musculoskeletal system: Secondary | ICD-10-CM | POA: Diagnosis not present

## 2018-05-28 DIAGNOSIS — M25611 Stiffness of right shoulder, not elsewhere classified: Secondary | ICD-10-CM | POA: Diagnosis not present

## 2018-05-28 NOTE — Therapy (Signed)
[facility] [address] [place], [name], [address] Phone: [phone]   Fax:  [phone]  Physical Therapy Treatment  Patient Details  Name: Jacqueline Atkinson MRN: [id number] Date of Birth: [birth year] Referring Provider: Dr. Wells Guiles Tat   Encounter Date: 05/28/2018  PT End of Session - 05/28/18 1638    Visit Number  2    Number of Visits  17    Date for PT Re-Evaluation  07/27/18    Authorization Type  [employer] VL:  [place] (progress note needed every 10th visit)    Authorization Time Period  PT cert 05/01/09-2/58/52    PT Start Time  1329 Pt arrived late    PT Stop Time  1400    PT Time Calculation (min)  31 min    Activity Tolerance  Patient tolerated treatment well needs one sitting break with standing activities    Behavior During Therapy  [facility] for tasks assessed/performed       Past Medical History:  Diagnosis Date  . Anemia    hx of  . Anxiety   . Arthritis    Left knee  osteoarthritis  . GERD (gastroesophageal reflux disease)   . Heart murmur   . History of hiatal hernia    small  . Hx of seasonal allergies    occ. use OTC Mucinex or Antihistamine.  . Hyperlipidemia   . Hypertension   . Neuromuscular disorder ([place])    "multi system atrophy disease"- "pain, bilateral foot numbness, left knee pain"- Dr. Carles Collet is following.  . Ovarian cancer on left ([place]) 2019   serous borderline s/p BSO  . Pneumonia   . 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 ABDOMINAL HYSTERECTOMY     04-02-18 Dr. Gerarda Fraction  . CYSTOSCOPY WITH RETROGRADE PYELOGRAM, URETEROSCOPY AND STENT PLACEMENT Bilateral 05/04/2018   Procedure: CYSTOSCOPY WITH RETROGRADE PYELOGRAM, URETEROSCOPY AND STENT PLACEMENT;  Surgeon: Cleon Gustin, MD;  Location: [facility];  Service: Urology;  Laterality: Bilateral;  .  DILATION AND CURETTAGE, DIAGNOSTIC / THERAPEUTIC     1981 and 1998 due to missed AB  . ESOPHAGOGASTRODUODENOSCOPY ENDOSCOPY     with esophageal dilation  . FOOT SURGERY  2011   3rd metatarsal LT foot  . KNEE ARTHROSCOPY Left 02/08/2015   Procedure: LEFT KNEE ARTHROSCOPY ;  Surgeon: Latanya Maudlin, MD;  Location: [facility];  Service: Orthopedics;  Laterality: Left;  . LAPAROTOMY N/A 04/02/2018   Procedure: EXPLORATORY LAPAROTOMY;  Surgeon: Isabel Caprice, MD;  Location: [facility];  Service: Gynecology;  Laterality: N/A;  . SALPINGOOPHORECTOMY Bilateral 04/02/2018   Procedure: BILATERAL SALPINGO OOPHORECTOMY;  Surgeon: Isabel Caprice, MD;  Location: [facility];  Service: Gynecology;  Laterality: Bilateral;  . TONSILLECTOMY AND ADENOIDECTOMY    . TOTAL KNEE ARTHROPLASTY Left 08/22/2015   Procedure: LEFT TOTAL  KNEE  ARTHROPLASTY;  Surgeon: Latanya Maudlin, MD;  Location: [facility];  Service: Orthopedics;  Laterality: Left;    There were no vitals filed for this visit.  Subjective Assessment - 05/28/18 1331    Subjective  Have had another surgery due to problems with my ureter, and I'm still having flank pain.  Still don't have stamina for even my exercises and for my walking.  No restrictions/precautions from surgery.    Pertinent History  surgery to remove abdominal mass 04/02/18, Hx of MSA;  knee arthritis    Patient Stated Goals  Wants to work on balance and stamina.    Currently in Pain?  Yes    Pain Score  2     Pain Location  Flank    Pain Orientation  Left    Pain Descriptors / Indicators  Aching    Pain Frequency  Intermittent    Aggravating Factors   urination    Pain Relieving Factors  pain medication helps                       [facility] Adult PT Treatment/Exercise - 05/28/18 0001      Posture/Postural Control   Posture Comments  Seated PWR! UP x 10 reps, then anterior/posterior pelvic tilts in sitting, finding neutral spine sitting posture:  alternating UE liftsx 10 reps, then  alternating marching x 4 reps each leg with cues to keep neutral spine/abdominal activation.      High Level Balance   High Level Balance Comments  REviewed her previous HHPT HEP (which she is doing at home); at sink:  gentle minisquats x 10 reps (cues for hinging at hips to prevent strong posterior lean), hip abduction, marching in place, heel toe raises (cues to avoid strong posterior lean), then SLS.  Pt needs 1 seated rest break during exercises at counter.  Discussed stamina/endurance, decreased activity level overall since her surgery in June and recommended continued performance of HEP and trying to get out of home for activities.  Discussed trying to add more seated exercises and maybe walking program updates to continue to address pt's fatigue level/endurance with functional mobility activities.               PT Short Term Goals - 05/28/18 1639      PT SHORT TERM GOAL #1   Title  Pt will be independent with HEP for improved transfers, gait and balance.  TARGET 05/29/18  Updated TARGET 06/28/18 due to pt not being scheduled since eval    Time  4    Period  Weeks    Status  On-going    Target Date  06/28/18      PT SHORT TERM GOAL #2   Title  Pt will improve TUG score to less than or equal to 30 seconds for decreased difficulty with ADLs at home, decreased fall risk.    Time  4    Period  Weeks    Status  On-going    Target Date  06/28/18      PT SHORT TERM GOAL #3   Title  Pt will improve gait velocity to at least 1.8 ft/sec for improved gait efficiency and safety.    Time  4    Period  Weeks    Status  On-going    Target Date  06/28/18      PT SHORT TERM GOAL #4   Title  Pt will verbalize understanding of fall prevention in the home environment.    Time  4    Period  Weeks    Status  On-going    Target Date  06/28/18        PT Long Term Goals - 05/28/18 1640      PT LONG TERM GOAL #1   Title  Pt will be independent with progression of HEP to address posture,  balance, transfers and gait.  Updated TARGET 07/27/18    Time  8    Period  Weeks    Status  On-going    Target Date  07/27/18      PT LONG TERM GOAL #2   Title  Pt will improve TUG cognitive score to less than or equal to 25 seconds for decreased fall risk.    Time  8    Period  Weeks    Status  On-going    Target Date  07/27/18      PT LONG TERM GOAL #3   Title  Pt will improve gait velocity to at least 2 ft/sec for improved gait efficiency and safety.    Time  8    Period  Weeks    Status  On-going    Target Date  07/27/18      PT LONG TERM GOAL #4   Title  Pt will improve 3 minute walk test by 50 ft for improved efficiency and safety with gait.    Time  8    Period  Weeks    Status  On-going    Target Date  07/27/18      PT LONG TERM GOAL #5   Title  Pt will verbalize plans for ongoing community fitness upon D/C from PT, including possibilty of aquatic therapy if appropriate.    Time  8    Period  Weeks    Status  On-going    Target Date  07/27/18            Plan - 05/28/18 1641    Clinical Impression Statement  Pt returns today for first PT visit since PT eval on 04/28/18, due to scheduling issues and medical follow up regarding her pelivs surgery.  She returns today with reports of no falls, but overall decreased stamina.  Pt did arrive late, so session was focused on review of the HEP she is performing at home and some seated exercises.  STGs and LTGs appear to remain appropriate and have been modified to reflect pt's absence and return today.    Rehab Potential  Good    PT Frequency  2x / week    PT Duration  8 weeks    PT Treatment/Interventions  ADLs/Self Care Home Management;Aquatic Therapy;Gait training;Stair training;Functional mobility training;Therapeutic activities;Therapeutic exercise;Balance training;Patient/family education;Neuromuscular re-education    PT Next Visit Plan  Seated posture, trunk stability, lower extremity exercises that patient can add to  HEP; discuss walking program for home       Patient will benefit from skilled therapeutic intervention in order to improve the following deficits and impairments:  Abnormal gait, Decreased activity tolerance, Decreased balance, Decreased endurance, Decreased mobility, Decreased strength, Difficulty walking, Postural dysfunction  Visit Diagnosis: Unsteadiness on feet     Problem List Patient Active Problem List   Diagnosis Date Noted  . Hydronephrosis 05/04/2018  . Flank pain 05/04/2018  . Intra-abdominal and pelvic swelling, mass and lump, unspecified site 04/02/2018  . Pelvic mass 03/14/2018  . Dyspnea on exertion 08/05/2017  . Hyperlipidemia 04/23/2016  . Arthritis 04/23/2016  . 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 Postoperative anemia due to acute blood loss 08/29/2015  . 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 Gait difficulty 12/29/2013  . Back pain 12/29/2013  . Paresthesia 12/19/2012  . Essential hypertension 11/28/2012  . Polypharmacy 11/28/2012  . GERD (gastroesophageal reflux disease) 11/28/2012    Lainie Daubert W. 05/28/2018, 4:44 PM  Frazier Butt., PT   [facility] [address] [place], [name], [address] Phone: [phone]   Fax:  [phone]  Name:  Jacqueline Atkinson MRN: [id number] Date of Birth: [birth year]

## 2018-05-28 NOTE — Therapy (Signed)
[facility] [address] [place], [name], [address] Phone: [phone]   Fax:  [phone]  Speech Language Pathology Treatment  Patient Details  Name: Jacqueline Atkinson: [id number] Date of Birth: [birth year] Referring Provider: Dr. Wells Guiles Atkinson   Encounter Date: 05/28/2018  End of Session - 05/28/18 1500    Visit Number  2    Number of Visits  17    Date for SLP Re-Evaluation  07/13/18 extended re-eval date due to pt with 4-week interim between 1st and 2nd visit    SLP Start Time  1404    SLP Stop Time   1445    SLP Time Calculation (min)  41 min    Activity Tolerance  Patient tolerated treatment well       Past Medical History:  Diagnosis Date  . Anemia    hx of  . Anxiety   . Arthritis    Left knee  osteoarthritis  . GERD (gastroesophageal reflux disease)   . Heart murmur   . History of hiatal hernia    small  . Hx of seasonal allergies    occ. use OTC Mucinex or Antihistamine.  . Hyperlipidemia   . Hypertension   . Neuromuscular disorder ([place])    "multi system atrophy disease"- "pain, bilateral foot numbness, left knee pain"- Dr. Carles Atkinson is following.  . Ovarian cancer on left ([place]) 2019   serous borderline s/p BSO  . Pneumonia   . 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 ABDOMINAL HYSTERECTOMY     04-02-18 Dr. Gerarda Atkinson  . CYSTOSCOPY WITH RETROGRADE PYELOGRAM, URETEROSCOPY AND STENT PLACEMENT Bilateral 05/04/2018   Procedure: CYSTOSCOPY WITH RETROGRADE PYELOGRAM, URETEROSCOPY AND STENT PLACEMENT;  Surgeon: Jacqueline Gustin, MD;  Location: [facility];  Service: Urology;  Laterality: Bilateral;  . DILATION AND CURETTAGE, DIAGNOSTIC / THERAPEUTIC     1981 and 1998 due to missed AB  . ESOPHAGOGASTRODUODENOSCOPY ENDOSCOPY     with esophageal dilation  . FOOT SURGERY  2011    3rd metatarsal LT foot  . KNEE ARTHROSCOPY Left 02/08/2015   Procedure: LEFT KNEE ARTHROSCOPY ;  Surgeon: Jacqueline Maudlin, MD;  Location: [facility];  Service: Orthopedics;  Laterality: Left;  . LAPAROTOMY N/A 04/02/2018   Procedure: EXPLORATORY LAPAROTOMY;  Surgeon: Jacqueline Caprice, MD;  Location: [facility];  Service: Gynecology;  Laterality: N/A;  . SALPINGOOPHORECTOMY Bilateral 04/02/2018   Procedure: BILATERAL SALPINGO OOPHORECTOMY;  Surgeon: Jacqueline Caprice, MD;  Location: [facility];  Service: Gynecology;  Laterality: Bilateral;  . TONSILLECTOMY AND ADENOIDECTOMY    . TOTAL KNEE ARTHROPLASTY Left 08/22/2015   Procedure: LEFT TOTAL  KNEE  ARTHROPLASTY;  Surgeon: Jacqueline Maudlin, MD;  Location: [facility];  Service: Orthopedics;  Laterality: Left;    There were no vitals filed for this visit.  Subjective Assessment - 05/28/18 1402    Currently in Pain?  Yes    Pain Score  2     Pain Location  Flank    Pain Orientation  Left    Pain Descriptors / Indicators  Aching    Pain Onset  1 to 4 weeks ago    Pain Frequency  Intermittent    Aggravating Factors   urination    Pain Relieving Factors  meds            ADULT SLP TREATMENT -  05/28/18 1407      General Information   Behavior/Cognition  Alert;Cooperative;Pleasant mood      Treatment Provided   Treatment provided  Cognitive-Linquistic      Cognitive-Linquistic Treatment   Treatment focused on  Cognition;Aphasia    Skilled Treatment  Aphasia (sp tx): SLP educated pt re: anomia compensations (synonym, circumlocution, describe, gesture, draw). SLP provided examples of each. SLP also gave suggestions when pt generates a word that she experienced anomia with. (cognitive tx 17 minutes) SLP initiated the Cognitive Linguisitic Quick Test and this will conclude tomorrow if pt arrives at her visit with enough time to complete tomorrow. (Pt has historically been routinely late for tx appointments so far.      Assessment / Recommendations / Plan    Plan  Continue with current plan of care      Progression Toward Goals   Progression toward goals  Progressing toward goals       SLP Education - 05/28/18 1459    Education Details  compensations for anomia, techniques to improve word finding with words pt has anomia with    Person(s) Educated  Patient    Methods  Explanation;Demonstration    Comprehension  Verbalized understanding;Returned demonstration;Need further instruction       SLP Short Term Goals - 05/28/18 1407      SLP SHORT TERM GOAL #1   Title  Pt will complete complex sequential naming and naming with parameters 8/10x with occasional min A    Time  4    Period  Weeks    Status  On-going      SLP SHORT TERM GOAL #2   Title  Pt will complete formal cognitive linguistic assessment and written assessment within 1st 3 visits    Time  2    Period  Weeks    Status  On-going      SLP SHORT TERM GOAL #4   Title  Pt will use external aids for medications and financial management with occasional min A over 2 sessions    Time  4    Period  Weeks    Status  New      SLP SHORT TERM GOAL #5   Title  Pt will perform moderately complex functional reasoning, problem solving, organizing tasks with 90% accuracy and occasional min A over 2 sessions    Time  4    Status  New       SLP Long Term Goals - 05/28/18 1507      SLP LONG TERM GOAL #1   Title  Pt will utilize compensatoins for dysnomia over 20 minute complex conversation for 3 episodes of dysnomia with rare min A    Time  8    Period  Weeks    Status  On-going      SLP LONG TERM GOAL #2   Title  Pt will alternate attention between 2 mildly complex cognitive linguistic tasks with 85% on each and occasional min A    Time  8    Period  Weeks    Status  On-going      SLP LONG TERM GOAL #3   Title  Pt will utilize compensations for dysarthria as needed during 10 minute conversation over 2 sessions with rare min A    Time  8    Period  Weeks    Status  On-going       SLP LONG TERM GOAL #4   Title  Pt will ID errors  in cognitive linguistic tasks 3/5x with rare min A over 2 sessions.     Time  8    Period  Weeks    Status  On-going       Plan - 05/28/18 1501    Clinical Impression Statement  [name] reports cont'd word finding difficulties and cont'd frustration when she can't think of a word, which occurs periodically during the day. During session today pt was 100% intellgible in this quiet office. SLP initiated Cognitive Lingustic Quick Test (CLQT) to be completed tomorrow. I recommend skillled ST to maximize verbal expression, cognition for improved independence, safety and to reduce caregiver burden.    Speech Therapy Frequency  2x / week    Duration  -- 8 weeks or 17 visits    Treatment/Interventions  Language facilitation;Environmental controls;Cueing hierarchy;Oral motor exercises;SLP instruction and feedback;Compensatory techniques;Cognitive reorganization;Functional tasks;Compensatory strategies;Internal/external aids;Multimodal communcation approach;Patient/family education    Potential to Achieve Goals  Fair    Potential Considerations  Medical prognosis       Patient will benefit from skilled therapeutic intervention in order to improve the following deficits and impairments:   Aphasia  Cognitive communication deficit  Dysphagia, unspecified type    Problem List Patient Active Problem List   Diagnosis Date Noted  . Hydronephrosis 05/04/2018  . Flank pain 05/04/2018  . Intra-abdominal and pelvic swelling, mass and lump, unspecified site 04/02/2018  . Pelvic mass 03/14/2018  . Dyspnea on exertion 08/05/2017  . Hyperlipidemia 04/23/2016  . Arthritis 04/23/2016  . 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 Postoperative anemia due to acute blood loss 08/29/2015  . 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 Gait difficulty 12/29/2013  . Back pain 12/29/2013  . Paresthesia 12/19/2012  . Essential hypertension  11/28/2012  . Polypharmacy 11/28/2012  . GERD (gastroesophageal reflux disease) 11/28/2012    [facility] ,MS, CCC-SLP  05/28/2018, 3:07 PM  [facility] [address] [place], [name], [address] Phone: [phone]   Fax:  [phone]   Name: Jacqueline Atkinson: [id number] Date of Birth: 12-Jun-1956

## 2018-05-28 NOTE — Patient Instructions (Signed)
   Ways to compensate for word finding:  1) use a synonym  2) describe it  3) rephrase (circumlocution)  ================ Gesturing Drawing

## 2018-05-29 ENCOUNTER — Ambulatory Visit: Payer: Medicare Other

## 2018-05-29 DIAGNOSIS — R2681 Unsteadiness on feet: Secondary | ICD-10-CM | POA: Diagnosis not present

## 2018-05-29 DIAGNOSIS — R1310 Dysphagia, unspecified: Secondary | ICD-10-CM

## 2018-05-29 DIAGNOSIS — R4701 Aphasia: Secondary | ICD-10-CM

## 2018-05-29 DIAGNOSIS — R41841 Cognitive communication deficit: Secondary | ICD-10-CM

## 2018-05-29 NOTE — Therapy (Signed)
[facility] [address] [place], [name], [address] Phone: [phone]   Fax:  [phone]  Speech Language Pathology Treatment  Patient Details  Name: Jacqueline Atkinson MRN: [id number] Date of Birth: Aug 13, 1956 Referring Provider: Dr. Wells Guiles Tat   Encounter Date: 05/29/2018  End of Session - 05/29/18 1711    Visit Number  3    Number of Visits  17    Date for SLP Re-Evaluation  07/13/18    SLP Start Time  1106    SLP Stop Time   1146    SLP Time Calculation (min)  40 min    Activity Tolerance  Patient tolerated treatment well       Past Medical History:  Diagnosis Date  . Anemia    hx of  . Anxiety   . Arthritis    Left knee  osteoarthritis  . GERD (gastroesophageal reflux disease)   . Heart murmur   . History of hiatal hernia    small  . Hx of seasonal allergies    occ. use OTC Mucinex or Antihistamine.  . Hyperlipidemia   . Hypertension   . Neuromuscular disorder ([place])    "multi system atrophy disease"- "pain, bilateral foot numbness, left knee pain"- Dr. Carles Collet is following.  . Ovarian cancer on left ([place]) 2019   serous borderline s/p BSO  . Pneumonia   . 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 ABDOMINAL HYSTERECTOMY     04-02-18 Dr. Gerarda Fraction  . CYSTOSCOPY WITH RETROGRADE PYELOGRAM, URETEROSCOPY AND STENT PLACEMENT Bilateral 05/04/2018   Procedure: CYSTOSCOPY WITH RETROGRADE PYELOGRAM, URETEROSCOPY AND STENT PLACEMENT;  Surgeon: Cleon Gustin, MD;  Location: [facility];  Service: Urology;  Laterality: Bilateral;  . DILATION AND CURETTAGE, DIAGNOSTIC / THERAPEUTIC     1981 and 1998 due to missed AB  . ESOPHAGOGASTRODUODENOSCOPY ENDOSCOPY     with esophageal dilation  . FOOT SURGERY  2011   3rd metatarsal LT foot  . KNEE ARTHROSCOPY Left 02/08/2015   Procedure:  LEFT KNEE ARTHROSCOPY ;  Surgeon: Latanya Maudlin, MD;  Location: [facility];  Service: Orthopedics;  Laterality: Left;  . LAPAROTOMY N/A 04/02/2018   Procedure: EXPLORATORY LAPAROTOMY;  Surgeon: Isabel Caprice, MD;  Location: [facility];  Service: Gynecology;  Laterality: N/A;  . SALPINGOOPHORECTOMY Bilateral 04/02/2018   Procedure: BILATERAL SALPINGO OOPHORECTOMY;  Surgeon: Isabel Caprice, MD;  Location: [facility];  Service: Gynecology;  Laterality: Bilateral;  . TONSILLECTOMY AND ADENOIDECTOMY    . TOTAL KNEE ARTHROPLASTY Left 08/22/2015   Procedure: LEFT TOTAL  KNEE  ARTHROPLASTY;  Surgeon: Latanya Maudlin, MD;  Location: [facility];  Service: Orthopedics;  Laterality: Left;    There were no vitals filed for this visit.  Subjective Assessment - 05/29/18 1111    Subjective  Pt enjoyed doing homework (synonym and antonym).    Currently in Pain?  Yes    Pain Score  2     Pain Location  Flank    Pain Orientation  Left    Pain Descriptors / Indicators  Aching    Pain Type  Surgical pain    Pain Onset  1 to 4 weeks ago    Pain Frequency  Intermittent    Aggravating Factors   urination     Pain Relieving Factors  meds  ADULT SLP TREATMENT - 05/29/18 1114      General Information   Behavior/Cognition  Alert;Cooperative;Pleasant mood      Treatment Provided   Treatment provided  Cognitive-Linquistic      Cognitive-Linquistic Treatment   Treatment focused on  Cognition    Skilled Treatment  Cognition: SLP completed Cognitive Linguistic Quick Test (CLQT). SLP ?s pt impulsivity as she began x3 prior to hearing the end of the instructions. Pt noted also to begin task without planning or organizaing (design making) - pt often drew three lines and then checked previous designs instead of checking previous designs first PRIOR to begin drawing another design. SLP asked pt if this was typical prior to dx of MSA and pt stated she would sometimes "dive into" an activity without planning but as  soon as she realized it required planningn she would stop, plan, and execute plan. Now she gets into an activity and sometimes gets "stuck" because of not pre-planning or organizing.       Assessment / Recommendations / Plan   Plan  Goals updated      Progression Toward Goals   Progression toward goals  Progressing toward goals       SLP Education - 05/29/18 1711    Education Details  deficit areas (executive function (planning/organizing))    Person(s) Educated  Patient    Methods  Explanation;Demonstration    Comprehension  Verbalized understanding       SLP Short Term Goals - 05/29/18 1715      SLP SHORT TERM GOAL #1   Title  fd    Time  4    Period  Weeks    Status  On-going      SLP SHORT TERM GOAL #2   Title  Pt will complete formal cognitive linguistic assessment and written assessment within 1st 3 visits    Time  2    Period  Weeks    Status  On-going      SLP SHORT TERM GOAL #4   Title  Pt will use external aids for medications and financial management with occasional min A over 2 sessions    Time  4    Period  Weeks    Status  New      SLP SHORT TERM GOAL #5   Title  Pt will perform moderately complex functional reasoning, problem solving, organizing tasks with 90% accuracy and occasional min A over 2 sessions    Time  4    Status  New       SLP Long Term Goals - 05/29/18 1258      SLP LONG TERM GOAL #1   Title  Pt will utilize compensatoins for dysnomia over 20 minute complex conversation for 3 episodes of dysnomia with rare min A    Time  8    Period  Weeks    Status  On-going      SLP LONG TERM GOAL #2   Title  Pt will alternate attention between 2 mildly complex cognitive linguistic tasks with 85% on each and occasional min A    Time  8    Period  Weeks    Status  On-going      SLP LONG TERM GOAL #3   Title  Pt will utilize compensations for dysarthria as needed during 10 minute conversation over 2 sessions with rare min A    Time  8     Period  Weeks    Status  On-going  SLP LONG TERM GOAL #4   Title  Pt will ID errors in cognitive linguistic tasks 3/5x with rare min A over 2 sessions.     Time  8    Period  Weeks    Status  On-going      SLP LONG TERM GOAL #5   Title  pt will demo compensatory measures (preplanning, etc) for therapy tasks requiring planning and organization 90% of the time    Time  8    Period  Weeks    Status  New       Plan - 05/29/18 1712    Clinical Impression Statement  SLP completed CognitiveLinguistic Quick Test today and found pt's executive function as a deficit (planning, organization, problem solving in higher complex situations). Pt stated she had a trip to [place] yesterday, did not plan to write what she needed and forgot some things. Additionally, the pharmacy was not open and so just went to do other shopping and never returned to pharmacy due to not planning a contingency - didn't set an alarm, or a reminder, etc. Goal for executive function (planning, organization) was added. I recommend skillled ST to maximize verbal expression, cognition for improved independence, safety and to reduce caregiver burden.    Speech Therapy Frequency  2x / week    Duration  -- 8 weeks or 17 visits    Treatment/Interventions  Language facilitation;Environmental controls;Cueing hierarchy;Oral motor exercises;SLP instruction and feedback;Compensatory techniques;Cognitive reorganization;Functional tasks;Compensatory strategies;Internal/external aids;Multimodal communcation approach;Patient/family education    Potential to Achieve Goals  Fair    Potential Considerations  Medical prognosis       Patient will benefit from skilled therapeutic intervention in order to improve the following deficits and impairments:   Cognitive communication deficit  Aphasia  Dysphagia, unspecified type    Problem List Patient Active Problem List   Diagnosis Date Noted  . Hydronephrosis 05/04/2018  . Flank pain  05/04/2018  . Intra-abdominal and pelvic swelling, mass and lump, unspecified site 04/02/2018  . Pelvic mass 03/14/2018  . Dyspnea on exertion 08/05/2017  . Hyperlipidemia 04/23/2016  . Arthritis 04/23/2016  . 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 Postoperative anemia due to acute blood loss 08/29/2015  . 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 Gait difficulty 12/29/2013  . Back pain 12/29/2013  . Paresthesia 12/19/2012  . Essential hypertension 11/28/2012  . Polypharmacy 11/28/2012  . GERD (gastroesophageal reflux disease) 11/28/2012    [facility] ,MS, CCC-SLP  05/29/2018, 5:15 PM  [facility] [address] [place], [name], [address] Phone: [phone]   Fax:  [phone]   Name: Jacqueline Atkinson MRN: [id number] Date of Birth: [birth year]

## 2018-06-02 DIAGNOSIS — R109 Unspecified abdominal pain: Secondary | ICD-10-CM | POA: Diagnosis not present

## 2018-06-02 DIAGNOSIS — N131 Hydronephrosis with ureteral stricture, not elsewhere classified: Secondary | ICD-10-CM | POA: Diagnosis not present

## 2018-06-04 ENCOUNTER — Ambulatory Visit: Payer: Medicare Other

## 2018-06-04 ENCOUNTER — Ambulatory Visit: Payer: Medicare Other | Admitting: Occupational Therapy

## 2018-06-04 DIAGNOSIS — R2681 Unsteadiness on feet: Secondary | ICD-10-CM | POA: Diagnosis not present

## 2018-06-04 DIAGNOSIS — R4701 Aphasia: Secondary | ICD-10-CM | POA: Diagnosis not present

## 2018-06-04 DIAGNOSIS — R29818 Other symptoms and signs involving the nervous system: Secondary | ICD-10-CM | POA: Diagnosis not present

## 2018-06-04 DIAGNOSIS — R29898 Other symptoms and signs involving the musculoskeletal system: Secondary | ICD-10-CM | POA: Diagnosis not present

## 2018-06-04 DIAGNOSIS — R41841 Cognitive communication deficit: Secondary | ICD-10-CM | POA: Diagnosis not present

## 2018-06-04 DIAGNOSIS — M25611 Stiffness of right shoulder, not elsewhere classified: Secondary | ICD-10-CM

## 2018-06-04 DIAGNOSIS — R293 Abnormal posture: Secondary | ICD-10-CM | POA: Diagnosis not present

## 2018-06-04 DIAGNOSIS — R2689 Other abnormalities of gait and mobility: Secondary | ICD-10-CM | POA: Diagnosis not present

## 2018-06-04 DIAGNOSIS — R1310 Dysphagia, unspecified: Secondary | ICD-10-CM | POA: Diagnosis not present

## 2018-06-04 DIAGNOSIS — R278 Other lack of coordination: Secondary | ICD-10-CM | POA: Diagnosis not present

## 2018-06-04 NOTE — Patient Instructions (Signed)
  Please complete the assigned speech therapy homework prior to your next session and return it to the speech therapist at your next visit.  

## 2018-06-04 NOTE — Therapy (Signed)
[facility] [address] [place], [name], [address] Phone: [phone]   Fax:  [phone]  Speech Language Pathology Treatment  Patient Details  Name: Jacqueline Atkinson MRN: [id number] Date of Birth: [birth year] Referring Provider: Dr. Wells Guiles Tat   Encounter Date: 06/04/2018  End of Session - 06/04/18 1658    Visit Number  4    Number of Visits  17    Date for SLP Re-Evaluation  07/13/18    SLP Start Time  1533    SLP Stop Time   1615    SLP Time Calculation (min)  42 min    Activity Tolerance  Patient tolerated treatment well       Past Medical History:  Diagnosis Date  . Anemia    hx of  . Anxiety   . Arthritis    Left knee  osteoarthritis  . GERD (gastroesophageal reflux disease)   . Heart murmur   . History of hiatal hernia    small  . Hx of seasonal allergies    occ. use OTC Mucinex or Antihistamine.  . Hyperlipidemia   . Hypertension   . Neuromuscular disorder ([place])    "multi system atrophy disease"- "pain, bilateral foot numbness, left knee pain"- Dr. Carles Collet is following.  . Ovarian cancer on left ([place]) 2019   serous borderline s/p BSO  . Pneumonia   . 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 ABDOMINAL HYSTERECTOMY     04-02-18 Dr. Gerarda Fraction  . CYSTOSCOPY WITH RETROGRADE PYELOGRAM, URETEROSCOPY AND STENT PLACEMENT Bilateral 05/04/2018   Procedure: CYSTOSCOPY WITH RETROGRADE PYELOGRAM, URETEROSCOPY AND STENT PLACEMENT;  Surgeon: Cleon Gustin, MD;  Location: [facility];  Service: Urology;  Laterality: Bilateral;  . DILATION AND CURETTAGE, DIAGNOSTIC / THERAPEUTIC     1981 and 1998 due to missed AB  . ESOPHAGOGASTRODUODENOSCOPY ENDOSCOPY     with esophageal dilation  . FOOT SURGERY  2011   3rd metatarsal LT foot  . KNEE ARTHROSCOPY Left 02/08/2015   Procedure:  LEFT KNEE ARTHROSCOPY ;  Surgeon: Latanya Maudlin, MD;  Location: [facility];  Service: Orthopedics;  Laterality: Left;  . LAPAROTOMY N/A 04/02/2018   Procedure: EXPLORATORY LAPAROTOMY;  Surgeon: Isabel Caprice, MD;  Location: [facility];  Service: Gynecology;  Laterality: N/A;  . SALPINGOOPHORECTOMY Bilateral 04/02/2018   Procedure: BILATERAL SALPINGO OOPHORECTOMY;  Surgeon: Isabel Caprice, MD;  Location: [facility];  Service: Gynecology;  Laterality: Bilateral;  . TONSILLECTOMY AND ADENOIDECTOMY    . TOTAL KNEE ARTHROPLASTY Left 08/22/2015   Procedure: LEFT TOTAL  KNEE  ARTHROPLASTY;  Surgeon: Latanya Maudlin, MD;  Location: [facility];  Service: Orthopedics;  Laterality: Left;    There were no vitals filed for this visit.  Subjective Assessment - 06/04/18 1538    Subjective  "I made a discovery this week. I found out that without time constraints I do a lot better." (pt, re: divergent naming tasks)    Currently in Pain?  Yes    Pain Score  2     Pain Location  Flank    Pain Orientation  Left    Pain Descriptors / Indicators  Aching    Pain Type  Surgical pain    Pain Onset  1 to 4 weeks ago    Pain Frequency  Intermittent    Aggravating Factors   urination  Pain Relieving Factors  meds            ADULT SLP TREATMENT - 06/04/18 1541      General Information   Behavior/Cognition  Alert;Cooperative;Pleasant mood      Treatment Provided   Treatment provided  Cognitive-Linquistic      Cognitive-Linquistic Treatment   Treatment focused on  Cognition    Skilled Treatment  SLP reviewed pt's homework with her. Pt with possible rare impulsivity on the written directinos task overlooking one color ("violet"). Pt sequenced 8-step sequences and req'd initial cues for double-checking her sequences, was independent on remaining sequences for double checking. Details in pt's verbal description of each card lacking in 1/8 in first sequence, 1/8 in second sequence, and pt 100% with last sequence.       Assessment / Recommendations / Plan   Plan  Continue with current plan of care      Progression Toward Goals   Progression toward goals  Progressing toward goals       SLP Education - 06/04/18 1658    Education Details  double checking responses    Person(s) Educated  Patient    Methods  Explanation    Comprehension  Verbalized understanding;Need further instruction       SLP Short Term Goals - 06/04/18 1711      SLP SHORT TERM GOAL #1   Title  Pt will complete complex sequential naming and naming with parameters 8/10x with occasional min A    Time  3    Period  Weeks    Status  On-going      SLP SHORT TERM GOAL #2   Title  Pt will complete formal cognitive linguistic assessment and written assessment within 1st 3 visits    Time  --    Period  --    Status  Achieved      SLP SHORT TERM GOAL #4   Title  Pt will use external aids for medications and financial management with occasional min A over 2 sessions    Time  4    Period  Weeks    Status  New      SLP SHORT TERM GOAL #5   Title  Pt will perform moderately complex functional reasoning, problem solving, organizing tasks with 90% accuracy and occasional min A over 2 sessions    Time  4    Status  New       SLP Long Term Goals - 06/04/18 1712      SLP LONG TERM GOAL #1   Title  Pt will utilize compensatoins for dysnomia over 20 minute complex conversation for 3 episodes of dysnomia with rare min A    Time  7    Period  Weeks    Status  On-going      SLP LONG TERM GOAL #2   Title  Pt will alternate attention between 2 mildly complex cognitive linguistic tasks with 85% on each and occasional min A    Time  7    Period  Weeks    Status  On-going      SLP LONG TERM GOAL #3   Title  Pt will utilize compensations for dysarthria as needed during 10 minute conversation over 2 sessions with rare min A    Time  7    Period  Weeks    Status  On-going      SLP LONG TERM GOAL #4   Title  Pt will ID errors in  cognitive linguistic tasks 3/5x with rare min A over 2 sessions.     Time  7    Period  Weeks    Status  On-going      SLP LONG TERM GOAL #5   Title  pt will demo compensatory measures (preplanning, etc) for therapy tasks requiring planning and organization 90% of the time    Time  7    Period  Weeks    Status  On-going       Plan - 06/04/18 1658    Clinical Impression Statement  Pt was engaged with tasks today to target organization and planning. See note for details.  I recommend skillled ST to maximize verbal expression, cognition for improved independence, safety and to reduce caregiver burden.    Speech Therapy Frequency  2x / week    Duration  --   8 weeks or 17 visits   Treatment/Interventions  Language facilitation;Environmental controls;Cueing hierarchy;Oral motor exercises;SLP instruction and feedback;Compensatory techniques;Cognitive reorganization;Functional tasks;Compensatory strategies;Internal/external aids;Multimodal communcation approach;Patient/family education    Potential to Achieve Goals  Fair    Potential Considerations  Medical prognosis       Patient will benefit from skilled therapeutic intervention in order to improve the following deficits and impairments:   Cognitive communication deficit  Aphasia  Dysphagia, unspecified type    Problem List Patient Active Problem List   Diagnosis Date Noted  . Hydronephrosis 05/04/2018  . Flank pain 05/04/2018  . Intra-abdominal and pelvic swelling, mass and lump, unspecified site 04/02/2018  . Pelvic mass 03/14/2018  . Dyspnea on exertion 08/05/2017  . Hyperlipidemia 04/23/2016  . Arthritis 04/23/2016  . 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 Postoperative anemia due to acute blood loss 08/29/2015  . 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 Gait difficulty 12/29/2013  . Back pain 12/29/2013  . Paresthesia 12/19/2012  . Essential hypertension 11/28/2012  . Polypharmacy 11/28/2012  . GERD  (gastroesophageal reflux disease) 11/28/2012    [facility] ,MS, CCC-SLP  06/04/2018, 5:13 PM  [facility] [address] [place], [name], [address] Phone: [phone]   Fax:  [phone]   Name: SAFIATOU ISLAM MRN: [id number] Date of Birth: 1956-07-31

## 2018-06-05 ENCOUNTER — Encounter: Payer: Self-pay | Admitting: Physical Therapy

## 2018-06-05 ENCOUNTER — Ambulatory Visit: Payer: Medicare Other | Admitting: Physical Therapy

## 2018-06-05 ENCOUNTER — Ambulatory Visit: Payer: Medicare Other

## 2018-06-05 DIAGNOSIS — R2689 Other abnormalities of gait and mobility: Secondary | ICD-10-CM

## 2018-06-05 DIAGNOSIS — R1310 Dysphagia, unspecified: Secondary | ICD-10-CM

## 2018-06-05 DIAGNOSIS — R41841 Cognitive communication deficit: Secondary | ICD-10-CM

## 2018-06-05 DIAGNOSIS — R4701 Aphasia: Secondary | ICD-10-CM

## 2018-06-05 DIAGNOSIS — R2681 Unsteadiness on feet: Secondary | ICD-10-CM | POA: Diagnosis not present

## 2018-06-05 DIAGNOSIS — R29898 Other symptoms and signs involving the musculoskeletal system: Secondary | ICD-10-CM

## 2018-06-05 DIAGNOSIS — R29818 Other symptoms and signs involving the nervous system: Secondary | ICD-10-CM

## 2018-06-05 NOTE — Therapy (Signed)
[facility] [address] [place], [name], [address] Phone: [phone]   Fax:  [phone]  Physical Therapy Treatment  Patient Details  Name: Jacqueline Atkinson MRN: [id number] Date of Birth: 08-Nov-1955 Referring Provider: Dr. Wells Guiles Tat   Encounter Date: 06/05/2018  PT End of Session - 06/05/18 2011    Visit Number  3    Number of Visits  17    Date for PT Re-Evaluation  07/27/18    Authorization Type  [employer] VL:  [place] (progress note needed every 10th visit)    Authorization Time Period  PT cert 04/05/61-9/52/84    PT Start Time  1312    PT Stop Time  1359    PT Time Calculation (min)  47 min    Activity Tolerance  Patient tolerated treatment well    Behavior During Therapy  [facility] for tasks assessed/performed       Past Medical History:  Diagnosis Date  . Anemia    hx of  . Anxiety   . Arthritis    Left knee  osteoarthritis  . GERD (gastroesophageal reflux disease)   . Heart murmur   . History of hiatal hernia    small  . Hx of seasonal allergies    occ. use OTC Mucinex or Antihistamine.  . Hyperlipidemia   . Hypertension   . Neuromuscular disorder ([place])    "multi system atrophy disease"- "pain, bilateral foot numbness, left knee pain"- Dr. Carles Collet is following.  . Ovarian cancer on left ([place]) 2019   serous borderline s/p BSO  . Pneumonia   . 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 ABDOMINAL HYSTERECTOMY     04-02-18 Dr. Gerarda Fraction  . CYSTOSCOPY WITH RETROGRADE PYELOGRAM, URETEROSCOPY AND STENT PLACEMENT Bilateral 05/04/2018   Procedure: CYSTOSCOPY WITH RETROGRADE PYELOGRAM, URETEROSCOPY AND STENT PLACEMENT;  Surgeon: Cleon Gustin, MD;  Location: [facility];  Service: Urology;  Laterality: Bilateral;  . DILATION AND CURETTAGE, DIAGNOSTIC / THERAPEUTIC     1981 and  1998 due to missed AB  . ESOPHAGOGASTRODUODENOSCOPY ENDOSCOPY     with esophageal dilation  . FOOT SURGERY  2011   3rd metatarsal LT foot  . KNEE ARTHROSCOPY Left 02/08/2015   Procedure: LEFT KNEE ARTHROSCOPY ;  Surgeon: Latanya Maudlin, MD;  Location: [facility];  Service: Orthopedics;  Laterality: Left;  . LAPAROTOMY N/A 04/02/2018   Procedure: EXPLORATORY LAPAROTOMY;  Surgeon: Isabel Caprice, MD;  Location: [facility];  Service: Gynecology;  Laterality: N/A;  . SALPINGOOPHORECTOMY Bilateral 04/02/2018   Procedure: BILATERAL SALPINGO OOPHORECTOMY;  Surgeon: Isabel Caprice, MD;  Location: [facility];  Service: Gynecology;  Laterality: Bilateral;  . TONSILLECTOMY AND ADENOIDECTOMY    . TOTAL KNEE ARTHROPLASTY Left 08/22/2015   Procedure: LEFT TOTAL  KNEE  ARTHROPLASTY;  Surgeon: Latanya Maudlin, MD;  Location: [facility];  Service: Orthopedics;  Laterality: Left;    There were no vitals filed for this visit.  Subjective Assessment - 06/05/18 1313    Subjective  No changes. No falls.   (Pended)     Pertinent History  surgery to remove abdominal mass 04/02/18, Hx of MSA; knee arthritis  (Pended)     Patient Stated Goals  Wants to work on balance and stamina.  (Pended)     Currently in Pain?  Yes  (Pended)  Pain Score  3   (Pended)     Pain Location  --  (Pended)    all over body aches   Pain Descriptors / Indicators  Aching  (Pended)     Pain Type  Chronic pain  (Pended)     Pain Relieving Factors  sleep  (Pended)                        [facility] Adult PT Treatment/Exercise - 06/05/18 2005      Bed Mobility   Bed Mobility  Supine to Sit;Sit to Supine    Supine to Sit  Independent    Sit to Supine  Independent      Transfers   Transfers  Sit to Stand;Stand to Sit    Sit to Stand  6: Modified independent (Device/Increase time)    Stand to Sit  6: Modified independent (Device/Increase time)    Comments  from elevated mat surface due to left knee deficits      Ambulation/Gait    Ambulation/Gait Assistance  4: Min guard    Ambulation/Gait Assistance Details  vc for proximity to U-step rolling walker; release hand grips when feels like the walker is getting away from her     Ambulation Distance (Feet)  100 Feet   60   Gait Pattern  Step-through pattern;Trunk flexed;Decreased dorsiflexion - right;Decreased dorsiflexion - left    Ambulation Surface  Indoor        PWR [facility]) - 06/05/18 1350    PWR! exercises  Moves in sitting;Moves in supine    PWR! Up  10    PWR! Rock  20    PWR! Twist  20    PWR! Step  10    Comments  Supine: pt does all ex's with knees bent to decr back pain and left knee pain    PWR! Up  10    PWR! Rock  20    PWR! Twist  20    Comments  Sitting          PT Education - 06/05/18 2010    Education Details  PWR! sitting and supine    Person(s) Educated  Patient    Methods  Explanation;Demonstration;Tactile cues;Verbal cues;Handout    Comprehension  Verbalized understanding;Returned demonstration;Verbal cues required;Tactile cues required;Need further instruction       PT Short Term Goals - 05/28/18 1639      PT SHORT TERM GOAL #1   Title  Pt will be independent with HEP for improved transfers, gait and balance.  TARGET 05/29/18  Updated TARGET 06/28/18 due to pt not being scheduled since eval    Time  4    Period  Weeks    Status  On-going    Target Date  06/28/18      PT SHORT TERM GOAL #2   Title  Pt will improve TUG score to less than or equal to 30 seconds for decreased difficulty with ADLs at home, decreased fall risk.    Time  4    Period  Weeks    Status  On-going    Target Date  06/28/18      PT SHORT TERM GOAL #3   Title  Pt will improve gait velocity to at least 1.8 ft/sec for improved gait efficiency and safety.    Time  4    Period  Weeks    Status  On-going    Target Date  06/28/18  PT SHORT TERM GOAL #4   Title  Pt will verbalize understanding of fall prevention in the home environment.    Time  4     Period  Weeks    Status  On-going    Target Date  06/28/18        PT Long Term Goals - 05/28/18 1640      PT LONG TERM GOAL #1   Title  Pt will be independent with progression of HEP to address posture, balance, transfers and gait.  Updated TARGET 07/27/18    Time  8    Period  Weeks    Status  On-going    Target Date  07/27/18      PT LONG TERM GOAL #2   Title  Pt will improve TUG cognitive score to less than or equal to 25 seconds for decreased fall risk.    Time  8    Period  Weeks    Status  On-going    Target Date  07/27/18      PT LONG TERM GOAL #3   Title  Pt will improve gait velocity to at least 2 ft/sec for improved gait efficiency and safety.    Time  8    Period  Weeks    Status  On-going    Target Date  07/27/18      PT LONG TERM GOAL #4   Title  Pt will improve 3 minute walk test by 50 ft for improved efficiency and safety with gait.    Time  8    Period  Weeks    Status  On-going    Target Date  07/27/18      PT LONG TERM GOAL #5   Title  Pt will verbalize plans for ongoing community fitness upon D/C from PT, including possibilty of aquatic therapy if appropriate.    Time  8    Period  Weeks    Status  On-going    Target Date  07/27/18            Plan - 06/05/18 2012    Clinical Impression Statement  Session focused on education for sitting and supine PWR! exercises to emphasize trunk stretching and strengthening. Patient reported decreased back pain by end of session. Will continue to work towards [employer].     Rehab Potential  Good    PT Frequency  2x / week    PT Duration  8 weeks    PT Treatment/Interventions  ADLs/Self Care Home Management;Aquatic Therapy;Gait training;Stair training;Functional mobility training;Therapeutic activities;Therapeutic exercise;Balance training;Patient/family education;Neuromuscular re-education    PT Next Visit Plan  Did family clear off queen bed she uses for supine PWR ex's? lower extremity exercises that patient  can add to HEP; discuss walking program for home;  Seated posture, trunk stability, supine vs sitting PWR!        Patient will benefit from skilled therapeutic intervention in order to improve the following deficits and impairments:  Abnormal gait, Decreased activity tolerance, Decreased balance, Decreased endurance, Decreased mobility, Decreased strength, Difficulty walking, Postural dysfunction  Visit Diagnosis: Other symptoms and signs involving the nervous system  Other symptoms and signs involving the musculoskeletal system  Other abnormalities of gait and mobility     Problem List Patient Active Problem List   Diagnosis Date Noted  . Hydronephrosis 05/04/2018  . Flank pain 05/04/2018  . Intra-abdominal and pelvic swelling, mass and lump, unspecified site 04/02/2018  . Pelvic mass 03/14/2018  . Dyspnea on exertion  08/05/2017  . Hyperlipidemia 04/23/2016  . Arthritis 04/23/2016  . 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 Postoperative anemia due to acute blood loss 08/29/2015  . 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 Gait difficulty 12/29/2013  . Back pain 12/29/2013  . Paresthesia 12/19/2012  . Essential hypertension 11/28/2012  . Polypharmacy 11/28/2012  . GERD (gastroesophageal reflux disease) 11/28/2012    Rexanne Mano, PT 06/05/2018, 8:16 PM  [facility] [address] [place], [name], [address] Phone: [phone]   Fax:  [phone]  Name: Jacqueline Atkinson MRN: [id number] Date of Birth: 13-Sep-1956

## 2018-06-05 NOTE — Patient Instructions (Signed)
  Please complete the assigned speech therapy homework prior to your next session and return it to the speech therapist at your next visit.  

## 2018-06-05 NOTE — Patient Instructions (Addendum)
Issued PWR sitting exercises. Patient currently does not have access to queen size bed with mat on top where she used to do supine PWR! Patient requested supine PWR exercise handout as the exercises made her back feel better and she hopes to get family to clear off the bed she used.

## 2018-06-05 NOTE — Therapy (Signed)
[facility] [address] [place], [name], [address] Phone: [phone]   Fax:  [phone]  Occupational Therapy Treatment  Patient Details  Name: Jacqueline Atkinson MRN: [id number] Date of Birth: 1956/01/26 Referring Provider: Dr. Wells Guiles Tat   Encounter Date: 06/04/2018  OT End of Session - 06/04/18 1458    Visit Number  2    Number of Visits  17    Date for OT Re-Evaluation  06/27/18    Authorization Type  [employer]    Authorization Time Period  cert. 04/28/18-07/27/18    Authorization - Visit Number  2    Authorization - Number of Visits  10    OT Start Time  6962    OT Stop Time  1530    OT Time Calculation (min)  39 min    Activity Tolerance  Patient tolerated treatment well    Behavior During Therapy  WFL for tasks assessed/performed       Past Medical History:  Diagnosis Date  . Anemia    hx of  . Anxiety   . Arthritis    Left knee  osteoarthritis  . GERD (gastroesophageal reflux disease)   . Heart murmur   . History of hiatal hernia    small  . Hx of seasonal allergies    occ. use OTC Mucinex or Antihistamine.  . Hyperlipidemia   . Hypertension   . Neuromuscular disorder ([place])    "multi system atrophy disease"- "pain, bilateral foot numbness, left knee pain"- Dr. Carles Collet is following.  . Ovarian cancer on left ([place]) 2019   serous borderline s/p BSO  . Pneumonia   . 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 ABDOMINAL HYSTERECTOMY     04-02-18 Dr. Gerarda Fraction  . CYSTOSCOPY WITH RETROGRADE PYELOGRAM, URETEROSCOPY AND STENT PLACEMENT Bilateral 05/04/2018   Procedure: CYSTOSCOPY WITH RETROGRADE PYELOGRAM, URETEROSCOPY AND STENT PLACEMENT;  Surgeon: Cleon Gustin, MD;  Location: [facility];  Service: Urology;  Laterality: Bilateral;  . DILATION AND CURETTAGE, DIAGNOSTIC  / THERAPEUTIC     1981 and 1998 due to missed AB  . ESOPHAGOGASTRODUODENOSCOPY ENDOSCOPY     with esophageal dilation  . FOOT SURGERY  2011   3rd metatarsal LT foot  . KNEE ARTHROSCOPY Left 02/08/2015   Procedure: LEFT KNEE ARTHROSCOPY ;  Surgeon: Latanya Maudlin, MD;  Location: [facility];  Service: Orthopedics;  Laterality: Left;  . LAPAROTOMY N/A 04/02/2018   Procedure: EXPLORATORY LAPAROTOMY;  Surgeon: Isabel Caprice, MD;  Location: [facility];  Service: Gynecology;  Laterality: N/A;  . SALPINGOOPHORECTOMY Bilateral 04/02/2018   Procedure: BILATERAL SALPINGO OOPHORECTOMY;  Surgeon: Isabel Caprice, MD;  Location: [facility];  Service: Gynecology;  Laterality: Bilateral;  . TONSILLECTOMY AND ADENOIDECTOMY    . TOTAL KNEE ARTHROPLASTY Left 08/22/2015   Procedure: LEFT TOTAL  KNEE  ARTHROPLASTY;  Surgeon: Latanya Maudlin, MD;  Location: [facility];  Service: Orthopedics;  Laterality: Left;    There were no vitals filed for this visit.  Subjective Assessment - 06/05/18 0836    Pertinent History  MSA (multiple systems atrophy; L knee replacement 2016; HTN; migraines; dyspnea on exertion; hyperlipidemia, pelvic mass (surgery 04/02/18); arthritis; GERD; HTN    Patient Stated Goals  return to the pool, improve writing, improve coordination/R hand use, improve R shoulder rigidity    Currently in Pain?  Yes    Pain Score  2     Pain Location  Flank    Pain Orientation  Left    Pain Descriptors / Indicators  Aching    Pain Type  Surgical pain    Pain Onset  1 to 4 weeks ago    Pain Frequency  Intermittent    Aggravating Factors   urination    Pain Relieving Factors  meds                  Treatment: Pt report frustration over loss of independence particularly driving  and desire to return to the pool. Therapist provided reassurance. Seated PWR! Basic 4 10-20 reps each, min v.c and use of mirror for positioning. PWR! Rock modified quadraped, min v.c/ facilitation Standing to perform dynamic step  and reach over target for larger steps, min-mod v.c            OT Short Term Goals - 04/28/18 2029      OT SHORT TERM GOAL #1   Title  Pt will be independent with updated HEP.--check 05/28/18    Time  4    Period  Weeks    Status  New      OT SHORT TERM GOAL #2   Title  Pt will verbalize understanding of appropriate community fitness opportunities (pool exercises, etc) and PD-related resources.    Time  4    Period  Weeks    Status  New        OT Long Term Goals - 04/28/18 2030      OT LONG TERM GOAL #1   Title  Pt will be verbalize understanding of adaptive strategies/AE for ADLs/IADLs to incr safety and ease.--check LTGs 07/27/18    Time  8    Period  Weeks    Status  New      OT LONG TERM GOAL #2   Title  Pt will improve dominant RUE functional reaching and coordination as shown by improving score on box and blocks test by at least 5.    Baseline  R-47, L-60 blocks    Time  8    Period  Weeks    Status  New      OT LONG TERM GOAL #3   Title  Pt will demo at least 145* R shoulder flexion for improved RUE reaching/rigidity.    Baseline  R-140*, L-150*    Time  8    Period  Weeks    Status  New      OT LONG TERM GOAL #4   Title  Pt will be able to write at least 1/2 page with good legibility for leisure tasks.    Time  8    Period  Weeks    Status  New      OT LONG TERM GOAL #5   Title  --    Time  --    Period  --    Status  --            Plan - 06/05/18 0837    Clinical Impression Statement  Pt reports to OT after 2 surgeries unrelated to PD. Pt is anxious to get moving and return to the pool.    Occupational Profile and client history currently impacting functional performance  Pt is mod I with BADLs, but is slowly decreasing participation in IADLs due to difficulty.  Pt also reports incr difficulty with BADLs and difficulty/decline in leisure activities affecting quality  of life.    Occupational performance deficits (Please refer to evaluation  for details):  ADL's;IADL's;Social Participation;Leisure    Rehab Potential  Fair    OT Frequency  2x / week   1x week in clinic   OT Duration  8 weeks    OT Treatment/Interventions  Aquatic Therapy;Self-care/ADL training;Cryotherapy;Paraffin;Therapeutic exercise;DME and/or AE instruction;Functional Mobility Training;Cognitive remediation/compensation;Balance training;Visual/perceptual remediation/compensation;Manual Therapy;Neuromuscular education;Fluidtherapy;Moist Heat;Ultrasound;Energy conservation;Passive range of motion;Therapeutic activities;Patient/family education    Plan  update HEP, PWR! supine    Consulted and Agree with Plan of Care  Patient       Patient will benefit from skilled therapeutic intervention in order to improve the following deficits and impairments:  Decreased balance, Decreased endurance, Decreased mobility, Impaired sensation, Impaired vision/preception, Impaired tone, Decreased range of motion, Decreased cognition, Decreased activity tolerance, Decreased coordination, Decreased knowledge of use of DME, Impaired UE functional use, Improper spinal/pelvic alignment, Decreased strength, Impaired flexibility, Decreased safety awareness  Visit Diagnosis: Other symptoms and signs involving the nervous system  Other symptoms and signs involving the musculoskeletal system  Other lack of coordination  Stiffness of right shoulder, not elsewhere classified    Problem List Patient Active Problem List   Diagnosis Date Noted  . Hydronephrosis 05/04/2018  . Flank pain 05/04/2018  . Intra-abdominal and pelvic swelling, mass and lump, unspecified site 04/02/2018  . Pelvic mass 03/14/2018  . Dyspnea on exertion 08/05/2017  . Hyperlipidemia 04/23/2016  . Arthritis 04/23/2016  . 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 Postoperative anemia due to acute blood loss 08/29/2015  . 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 Gait difficulty 12/29/2013  . Back pain  12/29/2013  . Paresthesia 12/19/2012  . Essential hypertension 11/28/2012  . Polypharmacy 11/28/2012  . GERD (gastroesophageal reflux disease) 11/28/2012    RINE,KATHRYN 06/05/2018, 8:40 AM  [facility] [address] [place], [name], [address] Phone: [phone]   Fax:  [phone]  Name: Jacqueline Atkinson MRN: [id number] Date of Birth: August 15, 1956

## 2018-06-05 NOTE — Therapy (Signed)
[facility] [address] [place], [name], [address] Phone: [phone]   Fax:  [phone]  Speech Language Pathology Treatment  Patient Details  Name: Jacqueline Atkinson MRN: [id number] Date of Birth: [birth year] Referring Provider: Dr. Wells Guiles Tat   Encounter Date: 06/05/2018  End of Session - 06/05/18 1704    Visit Number  5    Number of Visits  17    Date for SLP Re-Evaluation  07/13/18    SLP Start Time  1405    SLP Stop Time   1445    SLP Time Calculation (min)  40 min    Activity Tolerance  Patient tolerated treatment well       Past Medical History:  Diagnosis Date  . Anemia    hx of  . Anxiety   . Arthritis    Left knee  osteoarthritis  . GERD (gastroesophageal reflux disease)   . Heart murmur   . History of hiatal hernia    small  . Hx of seasonal allergies    occ. use OTC Mucinex or Antihistamine.  . Hyperlipidemia   . Hypertension   . Neuromuscular disorder ([place])    "multi system atrophy disease"- "pain, bilateral foot numbness, left knee pain"- Dr. Carles Collet is following.  . Ovarian cancer on left ([place]) 2019   serous borderline s/p BSO  . Pneumonia   . 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 ABDOMINAL HYSTERECTOMY     04-02-18 Dr. Gerarda Fraction  . CYSTOSCOPY WITH RETROGRADE PYELOGRAM, URETEROSCOPY AND STENT PLACEMENT Bilateral 05/04/2018   Procedure: CYSTOSCOPY WITH RETROGRADE PYELOGRAM, URETEROSCOPY AND STENT PLACEMENT;  Surgeon: Cleon Gustin, MD;  Location: [facility];  Service: Urology;  Laterality: Bilateral;  . DILATION AND CURETTAGE, DIAGNOSTIC / THERAPEUTIC     1981 and 1998 due to missed AB  . ESOPHAGOGASTRODUODENOSCOPY ENDOSCOPY     with esophageal dilation  . FOOT SURGERY  2011   3rd metatarsal LT foot  . KNEE ARTHROSCOPY Left 02/08/2015   Procedure:  LEFT KNEE ARTHROSCOPY ;  Surgeon: Latanya Maudlin, MD;  [facility];  Service: Orthopedics;  Laterality: Left;  . LAPAROTOMY N/A 04/02/2018   Procedure: EXPLORATORY LAPAROTOMY;  Surgeon: Isabel Caprice, MD;  [facility];  Service: Gynecology;  Laterality: N/A;  . SALPINGOOPHORECTOMY Bilateral 04/02/2018   Procedure: BILATERAL SALPINGO OOPHORECTOMY;  Surgeon: Isabel Caprice, MD;  [facility];  Service: Gynecology;  Laterality: Bilateral;  . TONSILLECTOMY AND ADENOIDECTOMY    . TOTAL KNEE ARTHROPLASTY Left 08/22/2015   Procedure: LEFT TOTAL  KNEE  ARTHROPLASTY;  Surgeon: Latanya Maudlin, MD;  [facility];  Service: Orthopedics;  Laterality: Left;    There were no vitals filed for this visit.  Subjective Assessment - 06/05/18 1421    Subjective  "(MSA) is like a boulder you carry around in a backpack. I chose to crush it up and leave it behind."    Currently in Pain?  Yes    Pain Score  2     Pain Location  --   "all over"   Pain Descriptors / Indicators  Aching    Pain Type  Acute pain    Pain Onset  Today    Pain Frequency  Constant            ADULT SLP TREATMENT - 06/05/18 1436  General Information   Behavior/Cognition  Alert;Cooperative;Pleasant mood      Treatment Provided   Treatment provided  Cognitive-Linquistic      Cognitive-Linquistic Treatment   Treatment focused on  Cognition    Skilled Treatment  SLP reviewed pt's homework with her - completed without errors in planning/organization. Pt organized 8-step sequences with errors in attention to detail on more ambiguous sequences than with more obvious sequences. Pt did not require cues for double-checking her sequences.      Assessment / Recommendations / Plan   Plan  Continue with current plan of care      Progression Toward Goals   Progression toward goals  Progressing toward goals         SLP Short Term Goals - 06/05/18 1704      SLP SHORT TERM GOAL #1   Title  Pt will complete  complex sequential naming and naming with parameters 8/10x with occasional min A    Time  3    Period  Weeks    Status  On-going      SLP SHORT TERM GOAL #2   Title  Pt will complete formal cognitive linguistic assessment and written assessment within 1st 3 visits    Status  Achieved      SLP SHORT TERM GOAL #4   Title  Pt will use external aids for medications and financial management with occasional min A over 2 sessions    Time  4    Period  Weeks    Status  New      SLP SHORT TERM GOAL #5   Title  Pt will perform moderately complex functional reasoning, problem solving, organizing tasks with 90% accuracy and occasional min A over 2 sessions    Time  4    Status  New       SLP Long Term Goals - 06/05/18 1704      SLP LONG TERM GOAL #1   Title  Pt will utilize compensatoins for dysnomia over 20 minute complex conversation for 3 episodes of dysnomia with rare min A    Time  7    Period  Weeks    Status  On-going      SLP LONG TERM GOAL #2   Title  Pt will alternate attention between 2 mildly complex cognitive linguistic tasks with 85% on each and occasional min A    Time  7    Period  Weeks    Status  On-going      SLP LONG TERM GOAL #3   Title  Pt will utilize compensations for dysarthria as needed during 10 minute conversation over 2 sessions with rare min A    Time  7    Period  Weeks    Status  On-going      SLP LONG TERM GOAL #4   Title  Pt will ID errors in cognitive linguistic tasks 3/5x with rare min A over 2 sessions.     Time  7    Period  Weeks    Status  On-going      SLP LONG TERM GOAL #5   Title  pt will demo compensatory measures (preplanning, etc) for therapy tasks requiring planning and organization 90% of the time    Time  7    Period  Weeks    Status  On-going       Plan - 06/05/18 1704    Clinical Impression Statement  Pt was engaged with tasks today  to target organization and planning. See note for details.  I recommend skillled ST to  maximize verbal expression, cognition for improved independence, safety and to reduce caregiver burden.    Speech Therapy Frequency  2x / week    Duration  --   8 weeks or 17 visits   Treatment/Interventions  Language facilitation;Environmental controls;Cueing hierarchy;Oral motor exercises;SLP instruction and feedback;Compensatory techniques;Cognitive reorganization;Functional tasks;Compensatory strategies;Internal/external aids;Multimodal communcation approach;Patient/family education    Potential to Achieve Goals  Fair    Potential Considerations  Medical prognosis       Patient will benefit from skilled therapeutic intervention in order to improve the following deficits and impairments:   Cognitive communication deficit  Aphasia  Dysphagia, unspecified type    Problem List Patient Active Problem List   Diagnosis Date Noted  . Hydronephrosis 05/04/2018  . Flank pain 05/04/2018  . Intra-abdominal and pelvic swelling, mass and lump, unspecified site 04/02/2018  . Pelvic mass 03/14/2018  . Dyspnea on exertion 08/05/2017  . Hyperlipidemia 04/23/2016  . Arthritis 04/23/2016  . 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 Postoperative anemia due to acute blood loss 08/29/2015  . 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 Gait difficulty 12/29/2013  . Back pain 12/29/2013  . Paresthesia 12/19/2012  . Essential hypertension 11/28/2012  . Polypharmacy 11/28/2012  . GERD (gastroesophageal reflux disease) 11/28/2012    [facility] ,MS, CCC-SLP  06/05/2018, 5:05 PM  [facility] [address] [place], [name], [address] Phone: [phone]   Fax:  [phone]   Name: Jacqueline Atkinson MRN: [id number] Date of Birth: [birth year]

## 2018-06-08 ENCOUNTER — Encounter: Payer: Self-pay | Admitting: Physical Therapy

## 2018-06-08 ENCOUNTER — Ambulatory Visit: Payer: Medicare Other

## 2018-06-08 ENCOUNTER — Ambulatory Visit: Payer: Medicare Other | Admitting: Physical Therapy

## 2018-06-08 DIAGNOSIS — R41841 Cognitive communication deficit: Secondary | ICD-10-CM

## 2018-06-08 DIAGNOSIS — R1310 Dysphagia, unspecified: Secondary | ICD-10-CM

## 2018-06-08 DIAGNOSIS — R2681 Unsteadiness on feet: Secondary | ICD-10-CM | POA: Diagnosis not present

## 2018-06-08 DIAGNOSIS — R29818 Other symptoms and signs involving the nervous system: Secondary | ICD-10-CM

## 2018-06-08 DIAGNOSIS — R2689 Other abnormalities of gait and mobility: Secondary | ICD-10-CM

## 2018-06-08 DIAGNOSIS — R4701 Aphasia: Secondary | ICD-10-CM

## 2018-06-08 NOTE — Patient Instructions (Signed)
Write down situations in which you have word finding difficulties.

## 2018-06-08 NOTE — Therapy (Signed)
[facility] [address] [place], [name], [address] Phone: [phone]   Fax:  [phone]  Speech Language Pathology Treatment  Patient Details  Name: ZAKARI COUCHMAN MRN: [id number] Date of Birth: 12-25-1955 Referring Provider: Dr. Wells Guiles Tat   Encounter Date: 06/08/2018  End of Session - 06/08/18 1501    Visit Number  6    Number of Visits  17    Date for SLP Re-Evaluation  07/13/18    SLP Start Time  1404    SLP Stop Time   1445    SLP Time Calculation (min)  41 min    Activity Tolerance  Patient tolerated treatment well       Past Medical History:  Diagnosis Date  . Anemia    hx of  . Anxiety   . Arthritis    Left knee  osteoarthritis  . GERD (gastroesophageal reflux disease)   . Heart murmur   . History of hiatal hernia    small  . Hx of seasonal allergies    occ. use OTC Mucinex or Antihistamine.  . Hyperlipidemia   . Hypertension   . Neuromuscular disorder ([place])    "multi system atrophy disease"- "pain, bilateral foot numbness, left knee pain"- Dr. Carles Collet is following.  . Ovarian cancer on left ([place]) 2019   serous borderline s/p BSO  . Pneumonia   . 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 ABDOMINAL HYSTERECTOMY     04-02-18 Dr. Gerarda Fraction  . CYSTOSCOPY WITH RETROGRADE PYELOGRAM, URETEROSCOPY AND STENT PLACEMENT Bilateral 05/04/2018   Procedure: CYSTOSCOPY WITH RETROGRADE PYELOGRAM, URETEROSCOPY AND STENT PLACEMENT;  Surgeon: Cleon Gustin, MD;  Location: [facility];  Service: Urology;  Laterality: Bilateral;  . DILATION AND CURETTAGE, DIAGNOSTIC / THERAPEUTIC     1981 and 1998 due to missed AB  . ESOPHAGOGASTRODUODENOSCOPY ENDOSCOPY     with esophageal dilation  . FOOT SURGERY  2011   3rd metatarsal LT foot  . KNEE ARTHROSCOPY Left 02/08/2015   Procedure:  LEFT KNEE ARTHROSCOPY ;  Surgeon: Latanya Maudlin, MD;  [facility];  Service: Orthopedics;  Laterality: Left;  . LAPAROTOMY N/A 04/02/2018   Procedure: EXPLORATORY LAPAROTOMY;  Surgeon: Isabel Caprice, MD;  [facility];  Service: Gynecology;  Laterality: N/A;  . SALPINGOOPHORECTOMY Bilateral 04/02/2018   Procedure: BILATERAL SALPINGO OOPHORECTOMY;  Surgeon: Isabel Caprice, MD;  [facility];  Service: Gynecology;  Laterality: Bilateral;  . TONSILLECTOMY AND ADENOIDECTOMY    . TOTAL KNEE ARTHROPLASTY Left 08/22/2015   Procedure: LEFT TOTAL  KNEE  ARTHROPLASTY;  Surgeon: Latanya Maudlin, MD;  [facility];  Service: Orthopedics;  Laterality: Left;    There were no vitals filed for this visit.  Subjective Assessment - 06/08/18 1406    Currently in Pain?  Yes    Pain Score  2     Pain Location  --   body aches   Pain Descriptors / Indicators  Aching    Pain Type  Acute pain    Pain Onset  More than a month ago    Pain Frequency  Constant    Aggravating Factors   unsure    Pain Relieving Factors  unsure            ADULT SLP TREATMENT - 06/08/18 1408      General Information  Behavior/Cognition  Alert;Cooperative;Pleasant mood      Treatment Provided   Treatment provided  Cognitive-Linquistic      Cognitive-Linquistic Treatment   Treatment focused on  Aphasia    Skilled Treatment  Pt stated with her homework that by the second item she had figured out to streamline her searching. Today, SLP worked with pt on word finding skills - No anomia noted in mod -max complex tasks and pt used WFL/WNL langauge with rare extra time. Dual meaning tasks, situations when what is said is inappropriate, and setting/place where one would hear a certain statement. SLP told pt to write down situations in which she has word finding difficulties in order to assess the extent to which it affects her daily tasks.      Assessment / Recommendations / Plan   Plan  Continue with current  plan of care      Progression Toward Goals   Progression toward goals  Progressing toward goals         SLP Short Term Goals - 06/08/18 1504      SLP SHORT TERM GOAL #1   Title  Pt will complete complex sequential naming and naming with parameters 8/10x with occasional min A    Time  2    Period  Weeks    Status  On-going      SLP SHORT TERM GOAL #2   Title  Pt will complete formal cognitive linguistic assessment and written assessment within 1st 3 visits    Status  Achieved      SLP SHORT TERM GOAL #4   Title  Pt will use external aids for medications and financial management with occasional min A over 2 sessions    Time  4    Period  Weeks    Status  New      SLP SHORT TERM GOAL #5   Title  Pt will perform moderately complex functional reasoning, problem solving, organizing tasks with 90% accuracy and occasional min A over 2 sessions    Time  4    Status  New       SLP Long Term Goals - 06/08/18 1631      SLP LONG TERM GOAL #1   Title  Pt will utilize compensatoins for dysnomia over 20 minute complex conversation for 3 episodes of dysnomia with rare min A    Time  6    Period  Weeks    Status  On-going      SLP LONG TERM GOAL #2   Title  Pt will alternate attention between 2 mildly complex cognitive linguistic tasks with 85% on each and occasional min A    Time  6    Period  Weeks    Status  On-going      SLP LONG TERM GOAL #3   Title  Pt will utilize compensations for dysarthria as needed during 10 minute conversation over 2 sessions with rare min A    Time  6    Period  Weeks    Status  On-going      SLP LONG TERM GOAL #4   Title  Pt will ID errors in cognitive linguistic tasks 3/5x with rare min A over 2 sessions.     Time  6    Period  Weeks    Status  On-going      SLP LONG TERM GOAL #5   Title  pt will demo compensatory measures (preplanning, etc) for therapy tasks requiring planning  and organization 90% of the time    Time  6    Period  Weeks     Status  On-going       Plan - 06/08/18 1502    Clinical Impression Statement  Ms. Jayson endorses cont'd frustration when she can't think of a word, however during session today pt did not exhibit anomia in structured tasks, or in conversation. I recommend skillled ST to maximize verbal expression, cognition for improved independence, safety and to reduce caregiver burden.    Speech Therapy Frequency  2x / week    Duration  --   8 weeks or 17 visits   Treatment/Interventions  Language facilitation;Environmental controls;Cueing hierarchy;Oral motor exercises;SLP instruction and feedback;Compensatory techniques;Cognitive reorganization;Functional tasks;Compensatory strategies;Internal/external aids;Multimodal communcation approach;Patient/family education    Potential to Achieve Goals  Fair    Potential Considerations  Medical prognosis       Patient will benefit from skilled therapeutic intervention in order to improve the following deficits and impairments:   Cognitive communication deficit  Aphasia  Dysphagia, unspecified type    Problem List Patient Active Problem List   Diagnosis Date Noted  . Hydronephrosis 05/04/2018  . Flank pain 05/04/2018  . Intra-abdominal and pelvic swelling, mass and lump, unspecified site 04/02/2018  . Pelvic mass 03/14/2018  . Dyspnea on exertion 08/05/2017  . Hyperlipidemia 04/23/2016  . Arthritis 04/23/2016  . 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 Postoperative anemia due to acute blood loss 08/29/2015  . 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 Gait difficulty 12/29/2013  . Back pain 12/29/2013  . Paresthesia 12/19/2012  . Essential hypertension 11/28/2012  . Polypharmacy 11/28/2012  . GERD (gastroesophageal reflux disease) 11/28/2012    [facility] ,MS, CCC-SLP  06/08/2018, 4:32 PM  [facility] [address] [place], [name], [address] Phone:  [phone]   Fax:  [phone]   Name: TSURUKO MURTHA MRN: [id number] Date of Birth: 1955-12-24

## 2018-06-09 NOTE — Therapy (Signed)
[facility] [address] [place], [name], [address] Phone: [phone]   Fax:  [phone]  Physical Therapy Treatment  Patient Details  Name: Jacqueline Atkinson MRN: [id number] Date of Birth: 03/03/1956 Referring Provider: Dr. Wells Guiles Tat   Encounter Date: 06/08/2018  PT End of Session - 06/09/18 0655    Visit Number  4    Number of Visits  17    Date for PT Re-Evaluation  07/27/18    Authorization Type  [employer] VL:  [place] (progress note needed every 10th visit)    Authorization Time Period  PT cert 06/29/41-6/83/41    PT Start Time  1323    PT Stop Time  1402    PT Time Calculation (min)  39 min    Activity Tolerance  Patient tolerated treatment well   Pt visibly fatigued   Behavior During Therapy  [facility] for tasks assessed/performed       Past Medical History:  Diagnosis Date  . Anemia    hx of  . Anxiety   . Arthritis    Left knee  osteoarthritis  . GERD (gastroesophageal reflux disease)   . Heart murmur   . History of hiatal hernia    small  . Hx of seasonal allergies    occ. use OTC Mucinex or Antihistamine.  . Hyperlipidemia   . Hypertension   . Neuromuscular disorder ([place])    "multi system atrophy disease"- "pain, bilateral foot numbness, left knee pain"- Dr. Carles Collet is following.  . Ovarian cancer on left ([place]) 2019   serous borderline s/p BSO  . Pneumonia   . 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 ABDOMINAL HYSTERECTOMY     04-02-18 Dr. Gerarda Fraction  . CYSTOSCOPY WITH RETROGRADE PYELOGRAM, URETEROSCOPY AND STENT PLACEMENT Bilateral 05/04/2018   Procedure: CYSTOSCOPY WITH RETROGRADE PYELOGRAM, URETEROSCOPY AND STENT PLACEMENT;  Surgeon: Cleon Gustin, MD;  Location: [facility];  Service: Urology;  Laterality: Bilateral;  . DILATION AND CURETTAGE, DIAGNOSTIC /  THERAPEUTIC     1981 and 1998 due to missed AB  . ESOPHAGOGASTRODUODENOSCOPY ENDOSCOPY     with esophageal dilation  . FOOT SURGERY  2011   3rd metatarsal LT foot  . KNEE ARTHROSCOPY Left 02/08/2015   Procedure: LEFT KNEE ARTHROSCOPY ;  Surgeon: Latanya Maudlin, MD;  Location: [facility];  Service: Orthopedics;  Laterality: Left;  . LAPAROTOMY N/A 04/02/2018   Procedure: EXPLORATORY LAPAROTOMY;  Surgeon: Isabel Caprice, MD;  Location: [facility];  Service: Gynecology;  Laterality: N/A;  . SALPINGOOPHORECTOMY Bilateral 04/02/2018   Procedure: BILATERAL SALPINGO OOPHORECTOMY;  Surgeon: Isabel Caprice, MD;  Location: [facility];  Service: Gynecology;  Laterality: Bilateral;  . TONSILLECTOMY AND ADENOIDECTOMY    . TOTAL KNEE ARTHROPLASTY Left 08/22/2015   Procedure: LEFT TOTAL  KNEE  ARTHROPLASTY;  Surgeon: Latanya Maudlin, MD;  Location: [facility];  Service: Orthopedics;  Laterality: Left;    There were no vitals filed for this visit.  Subjective Assessment - 06/08/18 1326    Subjective  Still feeling very tired.  Feel like I need to ask the doctor about my medications.    Pertinent History  surgery to remove abdominal mass 04/02/18, Hx of MSA; knee arthritis    Patient Stated Goals  Wants to work on balance and stamina.    Currently in Pain?  Yes  Pain Score  2     Pain Location  --   body aches   Pain Descriptors / Indicators  Aching    Pain Type  Acute pain    Pain Frequency  Constant    Aggravating Factors   unsure    Pain Relieving Factors  unsure                       [facility] Adult PT Treatment/Exercise - 06/09/18 0649      Bed Mobility   Bed Mobility  Supine to Sit;Sit to Supine    Supine to Sit  Independent    Sit to Supine  Independent      Transfers   Transfers  Sit to Stand;Stand to Sit    Sit to Stand  6: Modified independent (Device/Increase time)    Stand to Sit  6: Modified independent (Device/Increase time)      Ambulation/Gait   Ambulation/Gait  Yes     Ambulation/Gait Assistance  4: Min guard    Ambulation/Gait Assistance Details  VCs for posture, to stay close to walker    Ambulation Distance (Feet)  230 Feet   100 ft x 2   Assistive device  --   U-step RW   Gait Pattern  Step-through pattern;Trunk flexed;Decreased dorsiflexion - right;Decreased dorsiflexion - left    Ambulation Surface  Indoor      Self-Care   Self-Care  Other Self-Care Comments    Other Self-Care Comments   Discussed pt's ongoing fatigue, especially in relation to fatigue limiting functional mobility-recommended patient follow up with MD regarding ongoing fatigue (due to cancer and surgery history, PMH of anemia and due to pt's questions about potential medication interactions).  Pt verbalizes understanding; also pt verbalizes active plans for clearing queen bed in guest room to use for supine PWR! Moves exercises as part of HEP        [facility]) - 06/08/18 1334    PWR! exercises  Moves in sitting;Moves in supine    PWR! Up  x 10 reps through shoulders, 5 reps through hips    PWR! Rock  x 20 reps    PWR! Twist  x 20 reps    PWR! Step  x 10 reps    Comments  PWR! Moves in supine with knees bent    PWR! Up  x 10 reps    PWR! Rock  x 20 reps    PWR! Twist  x 20 reps    PWR! Step  x 5 reps marching, x 5 reps single side step    Comments  PWR! Moves in sitting-pt return demo understanding of HEP provided last visit      Cues provided to slow pace of exercises for improved stretch initially, cues to modify stepping exercise (in supine-hooklying marching x 10 reps, then single leg step out and in)    PT Education - 06/09/18 0654    Education Details  Review of HEP; discussed ongoing fatigue with PT recommending follow-up discussion with MD    Person(s) Educated  Patient    Methods  Explanation;Demonstration;Verbal cues    Comprehension  Verbalized understanding;Returned demonstration       PT Short Term Goals - 05/28/18 1639      PT SHORT TERM GOAL #1    Title  Pt will be independent with HEP for improved transfers, gait and balance.  TARGET 05/29/18  Updated TARGET 06/28/18 due to pt not being scheduled since eval  Time  4    Period  Weeks    Status  On-going    Target Date  06/28/18      PT SHORT TERM GOAL #2   Title  Pt will improve TUG score to less than or equal to 30 seconds for decreased difficulty with ADLs at home, decreased fall risk.    Time  4    Period  Weeks    Status  On-going    Target Date  06/28/18      PT SHORT TERM GOAL #3   Title  Pt will improve gait velocity to at least 1.8 ft/sec for improved gait efficiency and safety.    Time  4    Period  Weeks    Status  On-going    Target Date  06/28/18      PT SHORT TERM GOAL #4   Title  Pt will verbalize understanding of fall prevention in the home environment.    Time  4    Period  Weeks    Status  On-going    Target Date  06/28/18        PT Long Term Goals - 05/28/18 1640      PT LONG TERM GOAL #1   Title  Pt will be independent with progression of HEP to address posture, balance, transfers and gait.  Updated TARGET 07/27/18    Time  8    Period  Weeks    Status  On-going    Target Date  07/27/18      PT LONG TERM GOAL #2   Title  Pt will improve TUG cognitive score to less than or equal to 25 seconds for decreased fall risk.    Time  8    Period  Weeks    Status  On-going    Target Date  07/27/18      PT LONG TERM GOAL #3   Title  Pt will improve gait velocity to at least 2 ft/sec for improved gait efficiency and safety.    Time  8    Period  Weeks    Status  On-going    Target Date  07/27/18      PT LONG TERM GOAL #4   Title  Pt will improve 3 minute walk test by 50 ft for improved efficiency and safety with gait.    Time  8    Period  Weeks    Status  On-going    Target Date  07/27/18      PT LONG TERM GOAL #5   Title  Pt will verbalize plans for ongoing community fitness upon D/C from PT, including possibilty of aquatic therapy if  appropriate.    Time  8    Period  Weeks    Status  On-going    Target Date  07/27/18            Plan - 06/09/18 0656    Clinical Impression Statement  Pt able to return demonstrate supine and seated PWR! Moves from recent HEP additions, and reports overall feeling better and more fluid with movements after performing exercises.  Pt reports during session and continues to report ongoing fatigue, limiting participation in functional mobility; educated pt in need to follow up with MD about ongoing fatigue, given pt's medical history and her questions regarding medication interactions.    Rehab Potential  Good    PT Frequency  2x / week    PT Duration  8 weeks  PT Treatment/Interventions  ADLs/Self Care Home Management;Aquatic Therapy;Gait training;Stair training;Functional mobility training;Therapeutic activities;Therapeutic exercise;Balance training;Patient/family education;Neuromuscular re-education    PT Next Visit Plan  Continue lower extremity exercises that patient can add to HEP; discuss walking program for home;  Seated posture, trunk stability exercises; check BP (does pt plan to f/u with aquatic therapy with OT or PT?)       Patient will benefit from skilled therapeutic intervention in order to improve the following deficits and impairments:  Abnormal gait, Decreased activity tolerance, Decreased balance, Decreased endurance, Decreased mobility, Decreased strength, Difficulty walking, Postural dysfunction  Visit Diagnosis: Other symptoms and signs involving the nervous system  Other abnormalities of gait and mobility     Problem List Patient Active Problem List   Diagnosis Date Noted  . Hydronephrosis 05/04/2018  . Flank pain 05/04/2018  . Intra-abdominal and pelvic swelling, mass and lump, unspecified site 04/02/2018  . Pelvic mass 03/14/2018  . Dyspnea on exertion 08/05/2017  . Hyperlipidemia 04/23/2016  . Arthritis 04/23/2016  . 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 Postoperative anemia due to acute blood loss 08/29/2015  . 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 Gait difficulty 12/29/2013  . Back pain 12/29/2013  . Paresthesia 12/19/2012  . Essential hypertension 11/28/2012  . Polypharmacy 11/28/2012  . GERD (gastroesophageal reflux disease) 11/28/2012    Renita Brocks W. 06/09/2018, 6:59 AM  Frazier Butt., PT   [facility] [address] [place], [name], [address] Phone: [phone]   Fax:  [phone]  Name: Jacqueline Atkinson MRN: [id number] Date of Birth: [birth year]

## 2018-06-10 ENCOUNTER — Ambulatory Visit: Payer: Medicare Other

## 2018-06-10 ENCOUNTER — Encounter: Payer: Self-pay | Admitting: Occupational Therapy

## 2018-06-10 ENCOUNTER — Ambulatory Visit: Payer: Medicare Other | Admitting: Occupational Therapy

## 2018-06-10 VITALS — BP 127/68

## 2018-06-10 DIAGNOSIS — M25611 Stiffness of right shoulder, not elsewhere classified: Secondary | ICD-10-CM

## 2018-06-10 DIAGNOSIS — R41841 Cognitive communication deficit: Secondary | ICD-10-CM

## 2018-06-10 DIAGNOSIS — R278 Other lack of coordination: Secondary | ICD-10-CM

## 2018-06-10 DIAGNOSIS — R29898 Other symptoms and signs involving the musculoskeletal system: Secondary | ICD-10-CM

## 2018-06-10 DIAGNOSIS — R2681 Unsteadiness on feet: Secondary | ICD-10-CM | POA: Diagnosis not present

## 2018-06-10 DIAGNOSIS — R1310 Dysphagia, unspecified: Secondary | ICD-10-CM

## 2018-06-10 DIAGNOSIS — R4701 Aphasia: Secondary | ICD-10-CM

## 2018-06-10 NOTE — Therapy (Signed)
[facility] [address] [place], [name], [address] Phone: [phone]   Fax:  [phone]  Occupational Therapy Treatment  Patient Details  Name: Jacqueline Atkinson MRN: [id number] Date of Birth: 10/07/1956 Referring Provider: Dr. Wells Guiles Tat   Encounter Date: 06/10/2018  OT End of Session - 06/10/18 1634    Visit Number  3    Number of Visits  17    Date for OT Re-Evaluation  06/27/18    Authorization Type  [employer]    Authorization Time Period  cert. 04/28/18-07/27/18    Authorization - Visit Number  3    Authorization - Number of Visits  10    OT Start Time  3428    OT Stop Time  1403    OT Time Calculation (min)  45 min    Activity Tolerance  Patient tolerated treatment well    Behavior During Therapy  W[facility] for tasks assessed/performed       Past Medical History:  Diagnosis Date  . Anemia    hx of  . Anxiety   . Arthritis    Left knee  osteoarthritis  . GERD (gastroesophageal reflux disease)   . Heart murmur   . History of hiatal hernia    small  . Hx of seasonal allergies    occ. use OTC Mucinex or Antihistamine.  . Hyperlipidemia   . Hypertension   . Neuromuscular disorder ([place])    "multi system atrophy disease"- "pain, bilateral foot numbness, left knee pain"- Dr. Carles Collet is following.  . Ovarian cancer on left ([place]) 2019   serous borderline s/p BSO  . Pneumonia   . 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 ABDOMINAL HYSTERECTOMY     04-02-18 Dr. Gerarda Fraction  . CYSTOSCOPY WITH RETROGRADE PYELOGRAM, URETEROSCOPY AND STENT PLACEMENT Bilateral 05/04/2018   Procedure: CYSTOSCOPY WITH RETROGRADE PYELOGRAM, URETEROSCOPY AND STENT PLACEMENT;  Surgeon: Cleon Gustin, MD;  Location: [facility];  Service: Urology;  Laterality: Bilateral;  . DILATION AND CURETTAGE, DIAGNOSTIC  / THERAPEUTIC     1981 and 1998 due to missed AB  . ESOPHAGOGASTRODUODENOSCOPY ENDOSCOPY     with esophageal dilation  . FOOT SURGERY  2011   3rd metatarsal LT foot  . KNEE ARTHROSCOPY Left 02/08/2015   Procedure: LEFT KNEE ARTHROSCOPY ;  Surgeon: Latanya Maudlin, MD;  Location: [facility];  Service: Orthopedics;  Laterality: Left;  . LAPAROTOMY N/A 04/02/2018   Procedure: EXPLORATORY LAPAROTOMY;  Surgeon: Isabel Caprice, MD;  Location: [facility];  Service: Gynecology;  Laterality: N/A;  . SALPINGOOPHORECTOMY Bilateral 04/02/2018   Procedure: BILATERAL SALPINGO OOPHORECTOMY;  Surgeon: Isabel Caprice, MD;  Location: [facility];  Service: Gynecology;  Laterality: Bilateral;  . TONSILLECTOMY AND ADENOIDECTOMY    . TOTAL KNEE ARTHROPLASTY Left 08/22/2015   Procedure: LEFT TOTAL  KNEE  ARTHROPLASTY;  Surgeon: Latanya Maudlin, MD;  Location: [facility];  Service: Orthopedics;  Laterality: Left;    Vitals:   06/10/18 1327  BP: 127/68    Subjective Assessment - 06/10/18 1639    Pertinent History  MSA (multiple systems atrophy; L knee replacement 2016; HTN; migraines; dyspnea on exertion; hyperlipidemia, pelvic mass (surgery 04/02/18); arthritis; GERD; HTN    Limitations  no swimming/heavy lifting until cleared by surgeon due to pelvic mass removed 04/02/18; (appt 7/12), fall risk    Patient  Stated Goals  return to the pool, improve writing, improve coordination/R hand use, improve R shoulder rigidity    Currently in Pain?  Yes    Pain Location  --   all over body aches   Pain Onset  Today    Aggravating Factors   unsure    Pain Relieving Factors  unsure              Treatment: PWR! Supine Up, rock, twist 10-20 reps each, then shoulder flexion and diagonals with medium ball. Standing at table for PWR! rock modified quadraped, min facilitation v.c  Tossing scarves to targets for dynamic step and reach, min v.c Pt reports not taking one of her BP meds yesterday as she thinks it makes her lethargic,  therapist encouraged pt to to call MD to discuss rather than changing meds on her own.               OT Short Term Goals - 04/28/18 2029      OT SHORT TERM GOAL #1   Title  Pt will be independent with updated HEP.--check 05/28/18    Time  4    Period  Weeks    Status  New      OT SHORT TERM GOAL #2   Title  Pt will verbalize understanding of appropriate community fitness opportunities (pool exercises, etc) and PD-related resources.    Time  4    Period  Weeks    Status  New        OT Long Term Goals - 04/28/18 2030      OT LONG TERM GOAL #1   Title  Pt will be verbalize understanding of adaptive strategies/AE for ADLs/IADLs to incr safety and ease.--check LTGs 07/27/18    Time  8    Period  Weeks    Status  New      OT LONG TERM GOAL #2   Title  Pt will improve dominant RUE functional reaching and coordination as shown by improving score on box and blocks test by at least 5.    Baseline  R-47, L-60 blocks    Time  8    Period  Weeks    Status  New      OT LONG TERM GOAL #3   Title  Pt will demo at least 145* R shoulder flexion for improved RUE reaching/rigidity.    Baseline  R-140*, L-150*    Time  8    Period  Weeks    Status  New      OT LONG TERM GOAL #4   Title  Pt will be able to write at least 1/2 page with good legibility for leisure tasks.    Time  8    Period  Weeks    Status  New      OT LONG TERM GOAL #5   Title  --    Time  --    Period  --    Status  --            Plan - 06/10/18 1635    Clinical Impression Statement  Pt is not feeling well today. She reports being very fatigued and achy all over.    Occupational performance deficits (Please refer to evaluation for details):  ADL's;IADL's;Social Participation;Leisure    Rehab Potential  Fair    OT Frequency  2x / week    OT Duration  8 weeks    OT Treatment/Interventions  Aquatic Therapy;Self-care/ADL training;Cryotherapy;Paraffin;Therapeutic exercise;DME  and/or AE  instruction;Functional Mobility Training;Cognitive remediation/compensation;Balance training;Visual/perceptual remediation/compensation;Manual Therapy;Neuromuscular education;Fluidtherapy;Moist Heat;Ultrasound;Energy conservation;Passive range of motion;Therapeutic activities;Patient/family education    Plan  address ADL strategies, monitor BP- consider pool program if pt's family can attend    Consulted and Agree with Plan of Care  Patient       Patient will benefit from skilled therapeutic intervention in order to improve the following deficits and impairments:  Decreased balance, Decreased endurance, Decreased mobility, Impaired sensation, Impaired vision/preception, Impaired tone, Decreased range of motion, Decreased cognition, Decreased activity tolerance, Decreased coordination, Decreased knowledge of use of DME, Impaired UE functional use, Improper spinal/pelvic alignment, Decreased strength, Impaired flexibility, Decreased safety awareness  Visit Diagnosis: Other lack of coordination  Other symptoms and signs involving the musculoskeletal system  Stiffness of right shoulder, not elsewhere classified    Problem List Patient Active Problem List   Diagnosis Date Noted  . Hydronephrosis 05/04/2018  . Flank pain 05/04/2018  . Intra-abdominal and pelvic swelling, mass and lump, unspecified site 04/02/2018  . Pelvic mass 03/14/2018  . Dyspnea on exertion 08/05/2017  . Hyperlipidemia 04/23/2016  . Arthritis 04/23/2016  . 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 Postoperative anemia due to acute blood loss 08/29/2015  . 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 Gait difficulty 12/29/2013  . Back pain 12/29/2013  . Paresthesia 12/19/2012  . Essential hypertension 11/28/2012  . Polypharmacy 11/28/2012  . GERD (gastroesophageal reflux disease) 11/28/2012    RINE,KATHRYN 06/10/2018, 4:40 PM  [facility] [address] [place], [name], [address] Phone: [phone]   Fax:  [phone]  Name: Jacqueline Atkinson MRN: [id number] Date of Birth: 02-25-1956

## 2018-06-10 NOTE — Therapy (Signed)
[facility] [address] [place], [name], [address] Phone: [phone]   Fax:  [phone]  Speech Language Pathology Treatment  Patient Details  Name: Jacqueline Atkinson MRN: [id number] Date of Birth: 1955/12/04 Referring Provider: Dr. Wells Guiles Tat   Encounter Date: 06/10/2018  End of Session - 06/10/18 1700    Visit Number  7    Number of Visits  17    Date for SLP Re-Evaluation  07/13/18    SLP Start Time  4081    SLP Stop Time   1445    SLP Time Calculation (min)  42 min    Activity Tolerance  Patient tolerated treatment well       Past Medical History:  Diagnosis Date  . Anemia    hx of  . Anxiety   . Arthritis    Left knee  osteoarthritis  . GERD (gastroesophageal reflux disease)   . Heart murmur   . History of hiatal hernia    small  . Hx of seasonal allergies    occ. use OTC Mucinex or Antihistamine.  . Hyperlipidemia   . Hypertension   . Neuromuscular disorder ([place])    "multi system atrophy disease"- "pain, bilateral foot numbness, left knee pain"- Dr. Carles Collet is following.  . Ovarian cancer on left ([place]) 2019   serous borderline s/p BSO  . Pneumonia   . 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 ABDOMINAL HYSTERECTOMY     04-02-18 Dr. Gerarda Fraction  . CYSTOSCOPY WITH RETROGRADE PYELOGRAM, URETEROSCOPY AND STENT PLACEMENT Bilateral 05/04/2018   Procedure: CYSTOSCOPY WITH RETROGRADE PYELOGRAM, URETEROSCOPY AND STENT PLACEMENT;  Surgeon: Cleon Gustin, MD;  Location: [facility];  Service: Urology;  Laterality: Bilateral;  . DILATION AND CURETTAGE, DIAGNOSTIC / THERAPEUTIC     1981 and 1998 due to missed AB  . ESOPHAGOGASTRODUODENOSCOPY ENDOSCOPY     with esophageal dilation  . FOOT SURGERY  2011   3rd metatarsal LT foot  . KNEE ARTHROSCOPY Left 02/08/2015   Procedure:  LEFT KNEE ARTHROSCOPY ;  Surgeon: Latanya Maudlin, MD;  Location: [facility];  Service: Orthopedics;  Laterality: Left;  . LAPAROTOMY N/A 04/02/2018   Procedure: EXPLORATORY LAPAROTOMY;  Surgeon: Isabel Caprice, MD;  Location: [facility];  Service: Gynecology;  Laterality: N/A;  . SALPINGOOPHORECTOMY Bilateral 04/02/2018   Procedure: BILATERAL SALPINGO OOPHORECTOMY;  Surgeon: Isabel Caprice, MD;  Location: [facility];  Service: Gynecology;  Laterality: Bilateral;  . TONSILLECTOMY AND ADENOIDECTOMY    . TOTAL KNEE ARTHROPLASTY Left 08/22/2015   Procedure: LEFT TOTAL  KNEE  ARTHROPLASTY;  Surgeon: Latanya Maudlin, MD;  Location: [facility];  Service: Orthopedics;  Laterality: Left;    There were no vitals filed for this visit.  Subjective Assessment - 06/10/18 1410    Subjective  Pt reports two instances anomia in 2 days.     Currently in Pain?  Yes    Pain Score  5     Pain Location  --   all over body aches   Pain Orientation  --   entire joints   Pain Descriptors / Indicators  Aching    Pain Type  Acute pain    Pain Onset  Today    Pain Frequency  Constant            ADULT SLP TREATMENT - 06/10/18 1414  General Information   Behavior/Cognition  Alert;Cooperative;Pleasant mood      Treatment Provided   Treatment provided  Cognitive-Linquistic      Cognitive-Linquistic Treatment   Treatment focused on  Aphasia    Skilled Treatment  Pt reports two anomic incidents in 2 days. Worked with pt today on mod complex sequential naming and analogies ;pt req'd SBA. In alternating attention between conversation and these tasks, pt was functional/WNL. In functional reasoning task (grocery list choices) pt completed with independence ("I waited until the end to see about the potatoes, in case I went over and needed to change something."). Homework provided for scheduling (executive function).       Assessment / Recommendations / Plan   Plan  Continue with current plan of care      Progression  Toward Goals   Progression toward goals  Progressing toward goals         SLP Short Term Goals - 06/10/18 1430      SLP SHORT TERM GOAL #1   Title  Pt will complete complex sequential naming and naming with parameters 8/10x with occasional min A    Status  Achieved      SLP SHORT TERM GOAL #2   Title  Pt will complete formal cognitive linguistic assessment and written assessment within 1st 3 visits    Status  Achieved      SLP SHORT TERM GOAL #3   Title  n/a      SLP SHORT TERM GOAL #4   Title  Pt will use external aids for medications and financial management with occasional min A over 2 sessions    Baseline  meds 06-10-18;    Time  4    Period  Weeks    Status  On-going      SLP SHORT TERM GOAL #5   Title  Pt will perform moderately complex functional reasoning, problem solving, organizing tasks with 90% accuracy and occasional min A over 2 sessions    Time  4    Period  Weeks    Status  On-going       SLP Long Term Goals - 06/10/18 1703      SLP LONG TERM GOAL #1   Title  Pt will utilize compensatoins for dysnomia over 20 minute complex conversation for 3 episodes of dysnomia with rare min A    Time  6    Period  Weeks    Status  On-going      SLP LONG TERM GOAL #2   Title  Pt will alternate attention between 2 mildly complex cognitive linguistic tasks with 85% on each and occasional min A over two sessions    Baseline  06-10-18    Time  6    Period  Weeks    Status  Revised      SLP LONG TERM GOAL #3   Title  Pt will utilize compensations for dysarthria as needed during 10 minute conversation over 2 sessions with rare min A    Time  6    Period  Weeks    Status  On-going      SLP LONG TERM GOAL #4   Title  Pt will ID errors in cognitive linguistic tasks 3/5x with rare min A over 2 sessions.     Time  6    Period  Weeks    Status  On-going      SLP LONG TERM GOAL #5   Title  pt will demo  compensatory measures (preplanning, etc) for therapy tasks  requiring planning and organization 90% of the time    Baseline  06-10-18 (grocery task)    Time  6    Period  Weeks    Status  On-going       Plan - 06/10/18 1700    Clinical Impression Statement  Pt reported average one anomic incident each day since last session. Today she did very well in mod complex naming tasks, and in min-mod complex reasoning/executive function task. I recommend skillled ST to maximize verbal expression, cognition for improved independence, safety and to reduce caregiver burden. Consider d/c if pt performance continues as today.    Speech Therapy Frequency  2x / week    Duration  --   8 weeks or 17 visits   Treatment/Interventions  Language facilitation;Environmental controls;Cueing hierarchy;Oral motor exercises;SLP instruction and feedback;Compensatory techniques;Cognitive reorganization;Functional tasks;Compensatory strategies;Internal/external aids;Multimodal communcation approach;Patient/family education    Potential to Achieve Goals  Fair    Potential Considerations  Medical prognosis       Patient will benefit from skilled therapeutic intervention in order to improve the following deficits and impairments:   Cognitive communication deficit  Aphasia  Dysphagia, unspecified type    Problem List Patient Active Problem List   Diagnosis Date Noted  . Hydronephrosis 05/04/2018  . Flank pain 05/04/2018  . Intra-abdominal and pelvic swelling, mass and lump, unspecified site 04/02/2018  . Pelvic mass 03/14/2018  . Dyspnea on exertion 08/05/2017  . Hyperlipidemia 04/23/2016  . Arthritis 04/23/2016  . 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 Postoperative anemia due to acute blood loss 08/29/2015  . 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 Gait difficulty 12/29/2013  . Back pain 12/29/2013  . Paresthesia 12/19/2012  . Essential hypertension 11/28/2012  . Polypharmacy 11/28/2012  . GERD (gastroesophageal reflux disease) 11/28/2012     [facility] ,MS, CCC-SLP  06/10/2018, 5:06 PM  [facility] [address] [place], [name], [address] Phone: [phone]   Fax:  [phone]   Name: Jacqueline Atkinson MRN: [id number] Date of Birth: Apr 26, 1956

## 2018-06-10 NOTE — Patient Instructions (Signed)
  Please complete the assigned speech therapy homework prior to your next session and return it to the speech therapist at your next visit.  

## 2018-06-12 ENCOUNTER — Encounter: Payer: Self-pay | Admitting: Psychology

## 2018-06-12 ENCOUNTER — Ambulatory Visit (INDEPENDENT_AMBULATORY_CARE_PROVIDER_SITE_OTHER): Payer: Medicare Other | Admitting: Neurology

## 2018-06-12 DIAGNOSIS — G5131 Clonic hemifacial spasm, right: Secondary | ICD-10-CM

## 2018-06-12 MED ORDER — ONABOTULINUMTOXINA 100 UNITS IJ SOLR
17.5000 [IU] | Freq: Once | INTRAMUSCULAR | Status: AC
  Administered 2018-06-12: 17.5 [IU] via INTRAMUSCULAR

## 2018-06-12 NOTE — Procedures (Signed)
[facility]   History:  Diagnosis: Hemifacial spasm   Initial side: right   Result History  Pt reports that it helped.  Twitching recently returning  Consent obtained from: The patient Benefits discussed included, but were not limited to decreased muscle tightness, increased joint range of motion, and decreased pain.  Risk discussed included, but were not limited pain and discomfort, bleeding, bruising, excessive weakness, venous thrombosis, muscle atrophy and dysphagia.  A copy of the patient medication guide was given to the patient which explains the blackbox warning.  Patients identity and treatment sites confirmed Yes.  .  Details of Procedure: Skin was cleaned with alcohol.  A 30 gauge, 1/2 inch needle was introduced to target mm.  Prior to injection, the needle plunger was aspirated to make sure the needle was not within a blood vessel.  There was no blood retrieved on aspiration.    Following is a summary of the muscles injected  And the amount of Botulinum toxin used:  Injections  Location Left  Right Units Number of sites        Corrugator      Frontalis      Lower Lid, Lateral  2.5 2.5   Lower Lid Medial      Upper Lid, Lateral  2.5 2.5   Upper Lid, Medial      Canthus  5.0 5.0   nasalis  2.5 2.5   Masseter      Procerus      Zygomaticus Major      TOTAL UNITS:   12.5    Agent: Botulinum Type A ( Onobotulinum Toxin type A ).  1 vials of Botox were used, each containing 50 units and freshly diluted with 2 mL of sterile, non-perserved saline   Total injected (Units): 12.5  Total wasted (Units): 5 Pt tolerated procedure well without complications.   Reinjection is anticipated in 3 months.

## 2018-06-12 NOTE — Progress Notes (Signed)
I met with the patient today while she was in the clinic.  I have asked to meet with her as I noticed her affect was a little bit flat when saying hi to her before her appointment with her neurologist.  We talked a little bit about fatigue management as she has been experiencing a great amount of fatigue over the past several months.  She is also had 2 surgeries in the past couple months.  The patient plans to attend the atypical parkinsonian support group on next Wednesday.  I encouraged her to bring her daughter who would be providing transportation or at least invite her to be a part of the conversation.  A small portion of the group will be dedicated be having a former caregiver present on various topics related to caregiving with atypical parkinsonian disease.  I provided her with information on the Parkinson's in the arts program as she identifies greatly with the arts and could benefit from a attendance in this program as it could be fine and give her an outlet.    This is due to things being put in that space and it being more cluttered than usual.  She gave me permission to look into a respite care program that offers a 4-hour scholarship to see if the use of this respite care could be used to help her get this room where she can paint in it and it to be her creative space again.  This will be helpful for her quality of life.  In addition, it would be great respite in the sense that her family would not have to help with this.  I called [facility] and spoke with Rondel Jumbo.  They were in agreement that the respite care for 4 hours could be used for this type help.   I made a formal referral and provided them with the patient's contact information to arrange this service.  I contacted the patient back and she was thankful for this resource and will be waiting on their call.

## 2018-06-16 ENCOUNTER — Ambulatory Visit: Payer: Medicare Other

## 2018-06-16 ENCOUNTER — Ambulatory Visit: Payer: Medicare Other | Admitting: Physical Therapy

## 2018-06-16 VITALS — BP 147/83

## 2018-06-16 DIAGNOSIS — R293 Abnormal posture: Secondary | ICD-10-CM

## 2018-06-16 DIAGNOSIS — R1310 Dysphagia, unspecified: Secondary | ICD-10-CM

## 2018-06-16 DIAGNOSIS — R2681 Unsteadiness on feet: Secondary | ICD-10-CM | POA: Diagnosis not present

## 2018-06-16 DIAGNOSIS — R2689 Other abnormalities of gait and mobility: Secondary | ICD-10-CM

## 2018-06-16 DIAGNOSIS — R4701 Aphasia: Secondary | ICD-10-CM

## 2018-06-16 DIAGNOSIS — R41841 Cognitive communication deficit: Secondary | ICD-10-CM

## 2018-06-16 NOTE — Therapy (Signed)
[facility] [address] [place], [name], [address] Phone: [phone]   Fax:  [phone]  Speech Language Pathology Treatment  Patient Details  Name: REANNA SCOGGIN MRN: [id number] Date of Birth: [birth year] Referring Provider: Dr. Wells Guiles Tat   Encounter Date: 06/16/2018  End of Session - 06/16/18 1428    Visit Number  8    Number of Visits  17    Date for SLP Re-Evaluation  07/13/18    SLP Start Time  1324   pt 7 minutes late   SLP Stop Time   1400    SLP Time Calculation (min)  36 min    Activity Tolerance  Patient tolerated treatment well       Past Medical History:  Diagnosis Date  . Anemia    hx of  . Anxiety   . Arthritis    Left knee  osteoarthritis  . GERD (gastroesophageal reflux disease)   . Heart murmur   . History of hiatal hernia    small  . Hx of seasonal allergies    occ. use OTC Mucinex or Antihistamine.  . Hyperlipidemia   . Hypertension   . Neuromuscular disorder ([place])    "multi system atrophy disease"- "pain, bilateral foot numbness, left knee pain"- Dr. Carles Collet is following.  . Ovarian cancer on left ([place]) 2019   serous borderline s/p BSO  . Pneumonia   . 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 ABDOMINAL HYSTERECTOMY     04-02-18 Dr. Gerarda Fraction  . CYSTOSCOPY WITH RETROGRADE PYELOGRAM, URETEROSCOPY AND STENT PLACEMENT Bilateral 05/04/2018   Procedure: CYSTOSCOPY WITH RETROGRADE PYELOGRAM, URETEROSCOPY AND STENT PLACEMENT;  Surgeon: Cleon Gustin, MD;  [facility];  Service: Urology;  Laterality: Bilateral;  . DILATION AND CURETTAGE, DIAGNOSTIC / THERAPEUTIC     1981 and 1998 due to missed AB  . ESOPHAGOGASTRODUODENOSCOPY ENDOSCOPY     with esophageal dilation  . FOOT SURGERY  2011   3rd metatarsal LT foot  . KNEE ARTHROSCOPY Left  02/08/2015   Procedure: LEFT KNEE ARTHROSCOPY ;  Surgeon: Latanya Maudlin, MD;  Location: [facility];  Service: Orthopedics;  Laterality: Left;  . LAPAROTOMY N/A 04/02/2018   Procedure: EXPLORATORY LAPAROTOMY;  Surgeon: Isabel Caprice, MD;  Location: [facility];  Service: Gynecology;  Laterality: N/A;  . SALPINGOOPHORECTOMY Bilateral 04/02/2018   Procedure: BILATERAL SALPINGO OOPHORECTOMY;  Surgeon: Isabel Caprice, MD;  Location: [facility];  Service: Gynecology;  Laterality: Bilateral;  . TONSILLECTOMY AND ADENOIDECTOMY    . TOTAL KNEE ARTHROPLASTY Left 08/22/2015   Procedure: LEFT TOTAL  KNEE  ARTHROPLASTY;  Surgeon: Latanya Maudlin, MD;  Location: [facility];  Service: Orthopedics;  Laterality: Left;    There were no vitals filed for this visit.  Subjective Assessment - 06/16/18 1326    Subjective  Pt reports "I'm tired of being tired." Pt got out homework immediately for SLP.     Currently in Pain?  No/denies            ADULT SLP TREATMENT - 06/16/18 1327      General Information   Behavior/Cognition  Alert;Cooperative;Pleasant mood      Treatment Provided   Treatment provided  Cognitive-Linquistic      Cognitive-Linquistic Treatment   Treatment focused on  Cognition    Skilled Treatment  SLP provided pt with detailed  sequencing tasks and pt failed to read instructions and began assuming dates for holidays, took SLP cue for pt to follow directions. In conversation and this mod detailed/complex task pt alternated attention between conversation and task with excellent success. Pt reports trying to get family to play games for anomia with family, and they do this occasionally. SLP educated/reviewed anomia compensations with pt.       Assessment / Recommendations / Plan   Plan  Continue with current plan of care      Progression Toward Goals   Progression toward goals  Progressing toward goals       SLP Education - 06/16/18 1428    Education Details  anomia compensations    Person(s)  Educated  Patient    Methods  Explanation    Comprehension  Verbalized understanding       SLP Short Term Goals - 06/16/18 1331      SLP SHORT TERM GOAL #1   Title  Pt will complete complex sequential naming and naming with parameters 8/10x with occasional min A    Status  Achieved      SLP SHORT TERM GOAL #2   Title  Pt will complete formal cognitive linguistic assessment and written assessment within 1st 3 visits    Status  Achieved      SLP SHORT TERM GOAL #3   Title  n/a      SLP SHORT TERM GOAL #4   Title  Pt will use external aids for medications and financial management with occasional min A over 2 sessions    Status  Achieved      SLP SHORT TERM GOAL #5   Title  Pt will perform moderately complex functional reasoning, problem solving, organizing tasks with 90% accuracy and occasional min A over 2 sessions    Baseline  06-15-18    Time  3    Period  Weeks    Status  On-going       SLP Long Term Goals - 06/16/18 1342      SLP LONG TERM GOAL #1   Title  Pt will utilize compensatoins for dysnomia over 20 minute complex conversation for 3 episodes of dysnomia with rare min A    Time  5    Period  Weeks    Status  On-going      SLP LONG TERM GOAL #2   Title  Pt will alternate attention between 2 mildly complex cognitive linguistic tasks with 85% on each and occasional min A over two sessions    Status  Achieved      SLP LONG TERM GOAL #3   Title  Pt will utilize compensations for dysarthria as needed during 10 minute conversation over 2 sessions with rare min A    Baseline  06-16-18    Time  5    Period  Weeks    Status  On-going      SLP LONG TERM GOAL #4   Title  Pt will ID errors in cognitive linguistic tasks 3/5x with rare min A over 2 sessions.     Time  5    Period  Weeks    Status  On-going      SLP LONG TERM GOAL #5   Title  pt will demo compensatory measures (preplanning, etc) for therapy tasks requiring planning and organization 90% of the time     Baseline  06-10-18 (grocery task)    Time  5    Period  Weeks  Status  On-going       Plan - 06/16/18 1437    Clinical Impression Statement  Today pt cont to do very well in word finding in mod complex conversation, SLP told pt we would focus on compensatory measures as pt is not making anomic errors frequently enough to warrant formal work primarily on naming tasks in session. She agreed to this. Pt remains very intelligible, consistently from session to session, at this time. Cont to address cognition as necessary.  Consider re-eval with CLQT. I recommend skillled ST to maximize verbal expression, cognition for improved independence, safety and to reduce caregiver burden.     Speech Therapy Frequency  2x / week    Duration  --   8 weeks or 17 visits   Treatment/Interventions  Language facilitation;Environmental controls;Cueing hierarchy;Oral motor exercises;SLP instruction and feedback;Compensatory techniques;Cognitive reorganization;Functional tasks;Compensatory strategies;Internal/external aids;Multimodal communcation approach;Patient/family education    Potential to Achieve Goals  Fair    Potential Considerations  Medical prognosis       Patient will benefit from skilled therapeutic intervention in order to improve the following deficits and impairments:   Cognitive communication deficit  Aphasia  Dysphagia, unspecified type    Problem List Patient Active Problem List   Diagnosis Date Noted  . Hydronephrosis 05/04/2018  . Flank pain 05/04/2018  . Intra-abdominal and pelvic swelling, mass and lump, unspecified site 04/02/2018  . Pelvic mass 03/14/2018  . Dyspnea on exertion 08/05/2017  . Hyperlipidemia 04/23/2016  . Arthritis 04/23/2016  . 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 Postoperative anemia due to acute blood loss 08/29/2015  . 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 Gait difficulty 12/29/2013  . Back pain 12/29/2013  . Paresthesia 12/19/2012  .  Essential hypertension 11/28/2012  . Polypharmacy 11/28/2012  . GERD (gastroesophageal reflux disease) 11/28/2012    [facility] ,MS, CCC-SLP  06/16/2018, 5:31 PM  [facility] [address] [place], [name], [address] Phone: [phone]   Fax:  [phone]   Name: ILA LANDOWSKI MRN: [id number] Date of Birth: 08-27-1956

## 2018-06-16 NOTE — Patient Instructions (Signed)
Standing exercises at sink:  1) Side tap and weightshift-slowly lift leg and tap out to the side, then bring leg back to center starting point.  -Repeat with each leg 10 times  2)Standing facing the sink, with feet at least shoulder width apart; back up slightly from the sink  -Rock forward and back, "hinging" at your hips  -Repeat 10 times  -Do this gently, especially in the backwards direction, with focus on smooth transitions forward and back

## 2018-06-16 NOTE — Patient Instructions (Signed)
  Please complete the assigned speech therapy homework prior to your next session and return it to the speech therapist at your next visit.  

## 2018-06-17 NOTE — Therapy (Signed)
[facility] [address] [place], [name], [address] Phone: [phone]   Fax:  [phone]  Physical Therapy Treatment  Patient Details  Name: Jacqueline Atkinson MRN: [id number] Date of Birth: Sep 21, 1956 Referring Provider: Dr. Wells Guiles Tat   Encounter Date: 06/16/2018  PT End of Session - 06/16/18 1413    Visit Number  5    Number of Visits  17    Date for PT Re-Evaluation  07/27/18    Authorization Type  [employer] VL:  [place] (progress note needed every 10th visit)    Authorization Time Period  PT cert 11/02/36-4/53/64    PT Start Time  1411    PT Stop Time  1452    PT Time Calculation (min)  41 min    Activity Tolerance  Patient tolerated treatment well    Behavior During Therapy  [facility] for tasks assessed/performed       Past Medical History:  Diagnosis Date  . Anemia    hx of  . Anxiety   . Arthritis    Left knee  osteoarthritis  . GERD (gastroesophageal reflux disease)   . Heart murmur   . History of hiatal hernia    small  . Hx of seasonal allergies    occ. use OTC Mucinex or Antihistamine.  . Hyperlipidemia   . Hypertension   . Neuromuscular disorder ([place])    "multi system atrophy disease"- "pain, bilateral foot numbness, left knee pain"- Dr. Carles Collet is following.  . Ovarian cancer on left ([place]) 2019   serous borderline s/p BSO  . Pneumonia   . 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 ABDOMINAL HYSTERECTOMY     04-02-18 Dr. Gerarda Fraction  . CYSTOSCOPY WITH RETROGRADE PYELOGRAM, URETEROSCOPY AND STENT PLACEMENT Bilateral 05/04/2018   Procedure: CYSTOSCOPY WITH RETROGRADE PYELOGRAM, URETEROSCOPY AND STENT PLACEMENT;  Surgeon: Cleon Gustin, MD;  Location: [facility];  Service: Urology;  Laterality: Bilateral;  . DILATION AND CURETTAGE, DIAGNOSTIC / THERAPEUTIC     1981 and  1998 due to missed AB  . ESOPHAGOGASTRODUODENOSCOPY ENDOSCOPY     with esophageal dilation  . FOOT SURGERY  2011   3rd metatarsal LT foot  . KNEE ARTHROSCOPY Left 02/08/2015   Procedure: LEFT KNEE ARTHROSCOPY ;  Surgeon: Latanya Maudlin, MD;  Location: [facility];  Service: Orthopedics;  Laterality: Left;  . LAPAROTOMY N/A 04/02/2018   Procedure: EXPLORATORY LAPAROTOMY;  Surgeon: Isabel Caprice, MD;  Location: [facility];  Service: Gynecology;  Laterality: N/A;  . SALPINGOOPHORECTOMY Bilateral 04/02/2018   Procedure: BILATERAL SALPINGO OOPHORECTOMY;  Surgeon: Isabel Caprice, MD;  Location: [facility];  Service: Gynecology;  Laterality: Bilateral;  . TONSILLECTOMY AND ADENOIDECTOMY    . TOTAL KNEE ARTHROPLASTY Left 08/22/2015   Procedure: LEFT TOTAL  KNEE  ARTHROPLASTY;  Surgeon: Latanya Maudlin, MD;  Location: [facility];  Service: Orthopedics;  Laterality: Left;    Vitals:   06/16/18 1415  BP: (!) 147/83    Subjective Assessment - 06/17/18 2110    Subjective  I still need to talk to MD about fatigue.  Want to work on balance today.    Pertinent History  surgery to remove abdominal mass 04/02/18, Hx of MSA; knee arthritis    Patient Stated Goals  Wants to work on balance and stamina.    Currently in Pain?  No/denies  [place] Adult PT Treatment/Exercise - 06/17/18 0001      Ambulation/Gait   Ambulation/Gait  Yes    Ambulation/Gait Assistance  5: Supervision    Ambulation/Gait Assistance Details  VCs for posture, to stay close within BOS of walker.    Ambulation Distance (Feet)  140 Feet   x 2   Assistive device  --   U-step RW   Gait Pattern  Step-through pattern;Trunk flexed;Decreased dorsiflexion - right;Decreased dorsiflexion - left    Ambulation Surface  Indoor      Posture/Postural Control   Posture/Postural Control  Postural limitations    Postural Limitations  Forward head;Flexed trunk    Posture Comments  Sitting posture and balance exercises for  improved trunk control-forward lean onto therapy ball, then return to upright posture, supporting UEs on ball with weightshifting and reaching, then UE "stepping"  to the side on therapy ball, 10 reps each      High Level Balance   High Level Balance Comments  Standing at sink to address balance exercises-pt demo SLS activities that she performs at home.  Pt performs side step and weightshift x 10 reps each side with cues for smooth, controlled step and weightshift.  Stagger stance forward/back weightshifting, with cues for smooth, controlled anterior/posterior weightshifting.  Lateral weightshifting 10 reps, then with widened BOS, hip strategy work x 10 reps with cues to hinge at hips.  Back step and weightshift x 10 reps      Cues provided throughout standing activities at sink to work on smooth, controlled weightshifting through hips and to avoid strong lean/pull with UEs on sink in posterior direction.       PT Education - 06/17/18 2123    Education Details  Updates to standing balance HEP    Person(s) Educated  Patient    Methods  Explanation;Demonstration;Verbal cues;Handout    Comprehension  Verbalized understanding;Returned demonstration       PT Short Term Goals - 05/28/18 1639      PT SHORT TERM GOAL #1   Title  Pt will be independent with HEP for improved transfers, gait and balance.  TARGET 05/29/18  Updated TARGET 06/28/18 due to pt not being scheduled since eval    Time  4    Period  Weeks    Status  On-going    Target Date  06/28/18      PT SHORT TERM GOAL #2   Title  Pt will improve TUG score to less than or equal to 30 seconds for decreased difficulty with ADLs at home, decreased fall risk.    Time  4    Period  Weeks    Status  On-going    Target Date  06/28/18      PT SHORT TERM GOAL #3   Title  Pt will improve gait velocity to at least 1.8 ft/sec for improved gait efficiency and safety.    Time  4    Period  Weeks    Status  On-going    Target Date  06/28/18       PT SHORT TERM GOAL #4   Title  Pt will verbalize understanding of fall prevention in the home environment.    Time  4    Period  Weeks    Status  On-going    Target Date  06/28/18        PT Long Term Goals - 05/28/18 1640      PT LONG TERM GOAL #1   Title  Pt will  be independent with progression of HEP to address posture, balance, transfers and gait.  Updated TARGET 07/27/18    Time  8    Period  Weeks    Status  On-going    Target Date  07/27/18      PT LONG TERM GOAL #2   Title  Pt will improve TUG cognitive score to less than or equal to 25 seconds for decreased fall risk.    Time  8    Period  Weeks    Status  On-going    Target Date  07/27/18      PT LONG TERM GOAL #3   Title  Pt will improve gait velocity to at least 2 ft/sec for improved gait efficiency and safety.    Time  8    Period  Weeks    Status  On-going    Target Date  07/27/18      PT LONG TERM GOAL #4   Title  Pt will improve 3 minute walk test by 50 ft for improved efficiency and safety with gait.    Time  8    Period  Weeks    Status  On-going    Target Date  07/27/18      PT LONG TERM GOAL #5   Title  Pt will verbalize plans for ongoing community fitness upon D/C from PT, including possibilty of aquatic therapy if appropriate.    Time  8    Period  Weeks    Status  On-going    Target Date  07/27/18            Plan - 06/17/18 2124    Clinical Impression Statement  Per patient request, treatment session focused on standing balance exercises as well as seated postural control exercises (as pt reports she sometimes loses her balance, even in sitting with ADLs -encouraged her to talk to OT about this for possible adaptations).  Added to standing balance HEP, working on "hinging" at hips with weightshifting in posterior direction.  Pt will continue to benefit from skilled PT to address balance, posture, functional mobility.    Rehab Potential  Good    PT Frequency  2x / week    PT Duration   8 weeks    PT Treatment/Interventions  ADLs/Self Care Home Management;Aquatic Therapy;Gait training;Stair training;Functional mobility training;Therapeutic activities;Therapeutic exercise;Balance training;Patient/family education;Neuromuscular re-education    PT Next Visit Plan  Review standing balance exercises.  Will need to check STGs next visit.  ALSO-it appears pt has only 2 visits remaining for PT-need to discuss plan and if she needs more appointments.  Discuss walking program for home; seated posture, trunk stability exercises.      Consulted and Agree with Plan of Care  Patient       Patient will benefit from skilled therapeutic intervention in order to improve the following deficits and impairments:  Abnormal gait, Decreased activity tolerance, Decreased balance, Decreased endurance, Decreased mobility, Decreased strength, Difficulty walking, Postural dysfunction  Visit Diagnosis: Abnormal posture  Unsteadiness on feet  Other abnormalities of gait and mobility     Problem List Patient Active Problem List   Diagnosis Date Noted  . Hydronephrosis 05/04/2018  . Flank pain 05/04/2018  . Intra-abdominal and pelvic swelling, mass and lump, unspecified site 04/02/2018  . Pelvic mass 03/14/2018  . Dyspnea on exertion 08/05/2017  . Hyperlipidemia 04/23/2016  . Arthritis 04/23/2016  . 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 Postoperative anemia due to acute blood loss  08/29/2015  . 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 Gait difficulty 12/29/2013  . Back pain 12/29/2013  . Paresthesia 12/19/2012  . Essential hypertension 11/28/2012  . Polypharmacy 11/28/2012  . GERD (gastroesophageal reflux disease) 11/28/2012    Alishia Lebo W. 06/17/2018, 9:29 PM  Frazier Butt., PT   [facility] [address] [place], [name], [address] Phone: [phone]   Fax:  [phone]  Name: NAKEESHA BOWLER MRN:  [id number] Date of Birth: 10-18-1956

## 2018-06-18 ENCOUNTER — Ambulatory Visit: Payer: Medicare Other

## 2018-06-18 ENCOUNTER — Ambulatory Visit: Payer: Medicare Other | Admitting: Occupational Therapy

## 2018-06-18 ENCOUNTER — Encounter: Payer: Self-pay | Admitting: Occupational Therapy

## 2018-06-18 VITALS — BP 115/75

## 2018-06-18 DIAGNOSIS — R41841 Cognitive communication deficit: Secondary | ICD-10-CM | POA: Diagnosis not present

## 2018-06-18 DIAGNOSIS — R2681 Unsteadiness on feet: Secondary | ICD-10-CM | POA: Diagnosis not present

## 2018-06-18 DIAGNOSIS — M25611 Stiffness of right shoulder, not elsewhere classified: Secondary | ICD-10-CM

## 2018-06-18 DIAGNOSIS — R29818 Other symptoms and signs involving the nervous system: Secondary | ICD-10-CM

## 2018-06-18 DIAGNOSIS — R29898 Other symptoms and signs involving the musculoskeletal system: Secondary | ICD-10-CM

## 2018-06-18 DIAGNOSIS — R4701 Aphasia: Secondary | ICD-10-CM | POA: Diagnosis not present

## 2018-06-18 DIAGNOSIS — R1310 Dysphagia, unspecified: Secondary | ICD-10-CM

## 2018-06-18 DIAGNOSIS — R278 Other lack of coordination: Secondary | ICD-10-CM | POA: Diagnosis not present

## 2018-06-18 DIAGNOSIS — R293 Abnormal posture: Secondary | ICD-10-CM | POA: Diagnosis not present

## 2018-06-18 DIAGNOSIS — R2689 Other abnormalities of gait and mobility: Secondary | ICD-10-CM | POA: Diagnosis not present

## 2018-06-18 NOTE — Therapy (Signed)
[facility] [address] [place], [name], [address] Phone: [phone]   Fax:  [phone]  Occupational Therapy Treatment  Patient Details  Name: Jacqueline Atkinson MRN: [id number] Date of Birth: 09-03-1956 Referring Provider: Dr. Wells Guiles Tat   Encounter Date: 06/18/2018  OT End of Session - 06/18/18 1331    Visit Number  4    Number of Visits  17    Date for OT Re-Evaluation  06/27/18    Authorization Type  [employer]    Authorization Time Period  cert. 04/28/18-07/27/18    Authorization - Visit Number  4    Authorization - Number of Visits  10    OT Start Time  1320    OT Stop Time  1400    OT Time Calculation (min)  40 min       Past Medical History:  Diagnosis Date  . Anemia    hx of  . Anxiety   . Arthritis    Left knee  osteoarthritis  . GERD (gastroesophageal reflux disease)   . Heart murmur   . History of hiatal hernia    small  . Hx of seasonal allergies    occ. use OTC Mucinex or Antihistamine.  . Hyperlipidemia   . Hypertension   . Neuromuscular disorder ([place])    "multi system atrophy disease"- "pain, bilateral foot numbness, left knee pain"- Dr. Carles Collet is following.  . Ovarian cancer on left ([place]) 2019   serous borderline s/p BSO  . Pneumonia   . 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 ABDOMINAL HYSTERECTOMY     04-02-18 Dr. Gerarda Fraction  . CYSTOSCOPY WITH RETROGRADE PYELOGRAM, URETEROSCOPY AND STENT PLACEMENT Bilateral 05/04/2018   Procedure: CYSTOSCOPY WITH RETROGRADE PYELOGRAM, URETEROSCOPY AND STENT PLACEMENT;  Surgeon: Cleon Gustin, MD;  Location: [facility];  Service: Urology;  Laterality: Bilateral;  . DILATION AND CURETTAGE, DIAGNOSTIC / THERAPEUTIC     1981 and 1998 due to missed AB  . ESOPHAGOGASTRODUODENOSCOPY ENDOSCOPY     with esophageal  dilation  . FOOT SURGERY  2011   3rd metatarsal LT foot  . KNEE ARTHROSCOPY Left 02/08/2015   Procedure: LEFT KNEE ARTHROSCOPY ;  Surgeon: Latanya Maudlin, MD;  Location: [facility];  Service: Orthopedics;  Laterality: Left;  . LAPAROTOMY N/A 04/02/2018   Procedure: EXPLORATORY LAPAROTOMY;  Surgeon: Isabel Caprice, MD;  Location: [facility];  Service: Gynecology;  Laterality: N/A;  . SALPINGOOPHORECTOMY Bilateral 04/02/2018   Procedure: BILATERAL SALPINGO OOPHORECTOMY;  Surgeon: Isabel Caprice, MD;  Location: [facility];  Service: Gynecology;  Laterality: Bilateral;  . TONSILLECTOMY AND ADENOIDECTOMY    . TOTAL KNEE ARTHROPLASTY Left 08/22/2015   Procedure: LEFT TOTAL  KNEE  ARTHROPLASTY;  Surgeon: Latanya Maudlin, MD;  Location: [facility];  Service: Orthopedics;  Laterality: Left;    Vitals:   06/18/18 1326 06/18/18 1345  BP: (!) 153/87 115/75    Subjective Assessment - 06/18/18 1430    Limitations  no swimming/heavy lifting until cleared by surgeon due to pelvic mass removed 04/02/18; (appt 7/12), fall risk    Patient Stated Goals  return to the pool, improve writing, improve coordination/R hand use, improve R shoulder rigidity    Currently in Pain?  No/denies          Treatment: Pt arrived feeling shakey, pt had not eaten so  she was provided with peanut butter crackers which she ate. Pt recommends pt makes sure to eat before therapy and that she contacts her PCP regarding BP medication adjustment prn. Therapist told pt that if she would like to participate in the pool program her BP must be regulated. PWR! modified quadraped 10 -20 reps each min v.c, close supervision. Dynamic movements in standing with step and reach to hit targets with boomwhackers, min v.c for large amplitude movements, pt was able to follow a sequence for cognitive component.                    OT Short Term Goals - 04/28/18 2029      OT SHORT TERM GOAL #1   Title  Pt will be independent with updated  HEP.--check 05/28/18    Time  4    Period  Weeks    Status  New      OT SHORT TERM GOAL #2   Title  Pt will verbalize understanding of appropriate community fitness opportunities (pool exercises, etc) and PD-related resources.    Time  4    Period  Weeks    Status  New        OT Long Term Goals - 04/28/18 2030      OT LONG TERM GOAL #1   Title  Pt will be verbalize understanding of adaptive strategies/AE for ADLs/IADLs to incr safety and ease.--check LTGs 07/27/18    Time  8    Period  Weeks    Status  New      OT LONG TERM GOAL #2   Title  Pt will improve dominant RUE functional reaching and coordination as shown by improving score on box and blocks test by at least 5.    Baseline  R-47, L-60 blocks    Time  8    Period  Weeks    Status  New      OT LONG TERM GOAL #3   Title  Pt will demo at least 145* R shoulder flexion for improved RUE reaching/rigidity.    Baseline  R-140*, L-150*    Time  8    Period  Weeks    Status  New      OT LONG TERM GOAL #4   Title  Pt will be able to write at least 1/2 page with good legibility for leisure tasks.    Time  8    Period  Weeks    Status  New      OT LONG TERM GOAL #5   Title  --    Time  --    Period  --    Status  --            Plan - 06/18/18 1429    Clinical Impression Statement  Pt arrived today feeling shaky.She had not eaten and was provided with peanut butter crackers.    Occupational performance deficits (Please refer to evaluation for details):  ADL's;IADL's;Social Participation;Leisure    Rehab Potential  Fair    OT Frequency  2x / week    OT Treatment/Interventions  Aquatic Therapy;Self-care/ADL training;Cryotherapy;Paraffin;Therapeutic exercise;DME and/or AE instruction;Functional Mobility Training;Cognitive remediation/compensation;Balance training;Visual/perceptual remediation/compensation;Manual Therapy;Neuromuscular education;Fluidtherapy;Moist Heat;Ultrasound;Energy conservation;Passive range of  motion;Therapeutic activities;Patient/family education    Plan  address ADL strategies, monitor BP- consider pool program if  pt sees PCP and BP is managed and pt's family can attend    Consulted and Agree with Plan of Care  Patient  Patient will benefit from skilled therapeutic intervention in order to improve the following deficits and impairments:  Decreased balance, Decreased endurance, Decreased mobility, Impaired sensation, Impaired vision/preception, Impaired tone, Decreased range of motion, Decreased cognition, Decreased activity tolerance, Decreased coordination, Decreased knowledge of use of DME, Impaired UE functional use, Improper spinal/pelvic alignment, Decreased strength, Impaired flexibility, Decreased safety awareness  Visit Diagnosis: Other lack of coordination  Other symptoms and signs involving the musculoskeletal system  Stiffness of right shoulder, not elsewhere classified  Other symptoms and signs involving the nervous system    Problem List Patient Active Problem List   Diagnosis Date Noted  . Hydronephrosis 05/04/2018  . Flank pain 05/04/2018  . Intra-abdominal and pelvic swelling, mass and lump, unspecified site 04/02/2018  . Pelvic mass 03/14/2018  . Dyspnea on exertion 08/05/2017  . Hyperlipidemia 04/23/2016  . Arthritis 04/23/2016  . 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 Postoperative anemia due to acute blood loss 08/29/2015  . 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 Gait difficulty 12/29/2013  . Back pain 12/29/2013  . Paresthesia 12/19/2012  . Essential hypertension 11/28/2012  . Polypharmacy 11/28/2012  . GERD (gastroesophageal reflux disease) 11/28/2012    Letti Towell 06/18/2018, 2:32 PM  [facility] [address] [place], [name], [address] Phone: [phone]   Fax:  [phone]  Name: Jacqueline Atkinson MRN: [id number] Date of Birth: 06-22-1956

## 2018-06-18 NOTE — Therapy (Signed)
[facility] [address] [place], [name], [address] Phone: [phone]   Fax:  [phone]  Speech Language Pathology Treatment  Patient Details  Name: Jacqueline Atkinson MRN: [id number] Date of Birth: Jun 10, 1956 Referring Provider: Dr. Wells Guiles Tat   Encounter Date: 06/18/2018  End of Session - 06/18/18 1551    Visit Number  9    Number of Visits  17    Date for SLP Re-Evaluation  07/13/18    SLP Start Time  7939    SLP Stop Time   1445    SLP Time Calculation (min)  42 min    Activity Tolerance  Patient tolerated treatment well       Past Medical History:  Diagnosis Date  . Anemia    hx of  . Anxiety   . Arthritis    Left knee  osteoarthritis  . GERD (gastroesophageal reflux disease)   . Heart murmur   . History of hiatal hernia    small  . Hx of seasonal allergies    occ. use OTC Mucinex or Antihistamine.  . Hyperlipidemia   . Hypertension   . Neuromuscular disorder ([place])    "multi system atrophy disease"- "pain, bilateral foot numbness, left knee pain"- Dr. Carles Collet is following.  . Ovarian cancer on left ([place]) 2019   serous borderline s/p BSO  . Pneumonia   . 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 ABDOMINAL HYSTERECTOMY     04-02-18 Dr. Gerarda Fraction  . CYSTOSCOPY WITH RETROGRADE PYELOGRAM, URETEROSCOPY AND STENT PLACEMENT Bilateral 05/04/2018   Procedure: CYSTOSCOPY WITH RETROGRADE PYELOGRAM, URETEROSCOPY AND STENT PLACEMENT;  Surgeon: Cleon Gustin, MD;  Location: [facility];  Service: Urology;  Laterality: Bilateral;  . DILATION AND CURETTAGE, DIAGNOSTIC / THERAPEUTIC     1981 and 1998 due to missed AB  . ESOPHAGOGASTRODUODENOSCOPY ENDOSCOPY     with esophageal dilation  . FOOT SURGERY  2011   3rd metatarsal LT foot  . KNEE ARTHROSCOPY Left 02/08/2015   Procedure:  LEFT KNEE ARTHROSCOPY ;  Surgeon: Latanya Maudlin, MD;  Location: [facility];  Service: Orthopedics;  Laterality: Left;  . LAPAROTOMY N/A 04/02/2018   Procedure: EXPLORATORY LAPAROTOMY;  Surgeon: Isabel Caprice, MD;  Location: [facility];  Service: Gynecology;  Laterality: N/A;  . SALPINGOOPHORECTOMY Bilateral 04/02/2018   Procedure: BILATERAL SALPINGO OOPHORECTOMY;  Surgeon: Isabel Caprice, MD;  Location: [facility];  Service: Gynecology;  Laterality: Bilateral;  . TONSILLECTOMY AND ADENOIDECTOMY    . TOTAL KNEE ARTHROPLASTY Left 08/22/2015   Procedure: LEFT TOTAL  KNEE  ARTHROPLASTY;  Surgeon: Latanya Maudlin, MD;  Location: [facility];  Service: Orthopedics;  Laterality: Left;    There were no vitals filed for this visit.  Subjective Assessment - 06/18/18 1407    Subjective  Pt got out homework immediately for SLP.     Currently in Pain?  Yes    Pain Score  2     Pain Location  Other (Comment)   general aches   Pain Descriptors / Indicators  Aching    Pain Type  Chronic pain    Pain Onset  More than a month ago    Pain Frequency  Constant            ADULT SLP TREATMENT - 06/18/18 1408      General Information  Behavior/Cognition  Alert;Cooperative;Pleasant mood      Treatment Provided   Treatment provided  Cognitive-Linquistic      Cognitive-Linquistic Treatment   Treatment focused on  Cognition    Skilled Treatment  Cognitive tx: Pt began planning a weekend (FRiday - sunday) in [place] and searched on phone for hotel. When she realized she wasn't going to make her budget she reduced hotel price and gave herself $70 more for food in the process. SLP told her to figure out an itinerary for their stay for her homework. Pt then was engaged with a executive function and attention task (mall planning). Pt finished in 4 minutes, and all answers were correct. When SLP asked pt for her method, she stated she took what stores had the most "regulations" on them and placed them first and then other  stores mostly fell into place after making one adjustment (correctly recalled by pt).  SLP/pt discussed d/c likely 1st week in september or before.       Assessment / Recommendations / Plan   Plan  Continue with current plan of care      Progression Toward Goals   Progression toward goals  Progressing toward goals         SLP Short Term Goals - 06/18/18 1553      SLP SHORT TERM GOAL #1   Title  Pt will complete complex sequential naming and naming with parameters 8/10x with occasional min A    Status  Achieved      SLP SHORT TERM GOAL #2   Title  Pt will complete formal cognitive linguistic assessment and written assessment within 1st 3 visits    Status  Achieved      SLP SHORT TERM GOAL #3   Title  n/a      SLP SHORT TERM GOAL #4   Title  Pt will use external aids for medications and financial management with occasional min A over 2 sessions    Status  Achieved      SLP SHORT TERM GOAL #5   Title  Pt will perform moderately complex functional reasoning, problem solving, organizing tasks with 90% accuracy and occasional min A over 2 sessions    Status  Achieved       SLP Long Term Goals - 06/18/18 1554      SLP LONG TERM GOAL #1   Title  Pt will utilize compensatoins for dysnomia over 20 minute complex conversation for 3 episodes of dysnomia with rare min A    Time  5    Period  Weeks    Status  On-going      SLP LONG TERM GOAL #2   Title  Pt will alternate attention between 2 mildly complex cognitive linguistic tasks with 85% on each and occasional min A over two sessions    Status  Achieved      SLP LONG TERM GOAL #3   Title  Pt will utilize compensations for dysarthria as needed during 10 minute conversation over 2 sessions with rare min A    Status  Achieved      SLP LONG TERM GOAL #4   Title  Pt will ID errors in cognitive linguistic tasks 3/5x with rare min A over 2 sessions.     Baseline  06-18-18    Time  5    Period  Weeks    Status  On-going      SLP  LONG TERM GOAL #5   Title  pt will  demo compensatory measures (preplanning, etc) for therapy tasks requiring planning and organization 90% of the time    Baseline  06-10-18 (grocery task), 06-18-18 (mall)    Time  5    Period  Weeks    Status  On-going       Plan - 06/18/18 1552    Clinical Impression Statement  Today pt without notable difficulty in word finding in mod complex conversation, SLP told pt we would focus on compensatory measures as pt is not making anomic errors frequently enough to warrant formal work primarily on naming tasks in session. She agreed to this. Pt remains very intelligible, consistently from session to session, at this time. Cont to address cognition as necessary.  Consider re-eval with CLQT. I recommend skillled ST to maximize verbal expression, cognition for improved independence, safety and to reduce caregiver burden.     Speech Therapy Frequency  2x / week    Duration  --   8 weeks or 17 visits   Treatment/Interventions  Language facilitation;Environmental controls;Cueing hierarchy;Oral motor exercises;SLP instruction and feedback;Compensatory techniques;Cognitive reorganization;Functional tasks;Compensatory strategies;Internal/external aids;Multimodal communcation approach;Patient/family education    Potential to Achieve Goals  Fair    Potential Considerations  Medical prognosis       Patient will benefit from skilled therapeutic intervention in order to improve the following deficits and impairments:   Cognitive communication deficit  Aphasia  Dysphagia, unspecified type    Problem List Patient Active Problem List   Diagnosis Date Noted  . Hydronephrosis 05/04/2018  . Flank pain 05/04/2018  . Intra-abdominal and pelvic swelling, mass and lump, unspecified site 04/02/2018  . Pelvic mass 03/14/2018  . Dyspnea on exertion 08/05/2017  . Hyperlipidemia 04/23/2016  . Arthritis 04/23/2016  . 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 Postoperative  anemia due to acute blood loss 08/29/2015  . 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 Gait difficulty 12/29/2013  . Back pain 12/29/2013  . Paresthesia 12/19/2012  . Essential hypertension 11/28/2012  . Polypharmacy 11/28/2012  . GERD (gastroesophageal reflux disease) 11/28/2012    [facility] ,MS, CCC-SLP  06/18/2018, 3:56 PM  [facility] [address] [place], [name], [address] Phone: [phone]   Fax:  [phone]   Name: SHANEKA EFAW MRN: [id number] Date of Birth: January 28, 1956

## 2018-06-18 NOTE — Patient Instructions (Signed)
  Please complete the assigned speech therapy homework prior to your next session and return it to the speech therapist at your next visit.  

## 2018-06-23 ENCOUNTER — Ambulatory Visit: Payer: Medicare Other | Admitting: Physical Therapy

## 2018-06-23 ENCOUNTER — Encounter: Payer: Self-pay | Admitting: Physical Therapy

## 2018-06-23 ENCOUNTER — Ambulatory Visit: Payer: Medicare Other

## 2018-06-23 DIAGNOSIS — R4701 Aphasia: Secondary | ICD-10-CM

## 2018-06-23 DIAGNOSIS — R2681 Unsteadiness on feet: Secondary | ICD-10-CM | POA: Diagnosis not present

## 2018-06-23 DIAGNOSIS — R1310 Dysphagia, unspecified: Secondary | ICD-10-CM

## 2018-06-23 DIAGNOSIS — R41841 Cognitive communication deficit: Secondary | ICD-10-CM

## 2018-06-23 DIAGNOSIS — R2689 Other abnormalities of gait and mobility: Secondary | ICD-10-CM

## 2018-06-23 DIAGNOSIS — R29818 Other symptoms and signs involving the nervous system: Secondary | ICD-10-CM

## 2018-06-23 NOTE — Patient Instructions (Signed)
  Please complete the assigned speech therapy homework prior to your next session and return it to the speech therapist at your next visit.  

## 2018-06-23 NOTE — Therapy (Signed)
[facility] [address] [place], [name], [address] Phone: [phone]   Fax:  [phone]  Physical Therapy Treatment  Patient Details  Name: Jacqueline Atkinson MRN: [id number] Date of Birth: [birth year] Referring Provider: Dr. Wells Guiles Tat   Encounter Date: 06/23/2018  PT End of Session - 06/23/18 2015    Visit Number  6    Number of Visits  17    Date for PT Re-Evaluation  07/27/18    Authorization Type  [employer] [place]:  [place] (progress note needed every 10th visit)    Authorization Time Period  PT cert 05/30/40-9/62/22    PT Start Time  1452    PT Stop Time  1535    PT Time Calculation (min)  43 min    Activity Tolerance  Patient tolerated treatment well    Behavior During Therapy  [facility] for tasks assessed/performed       Past Medical History:  Diagnosis Date  . Anemia    hx of  . Anxiety   . Arthritis    Left knee  osteoarthritis  . GERD (gastroesophageal reflux disease)   . Heart murmur   . History of hiatal hernia    small  . Hx of seasonal allergies    occ. use OTC Mucinex or Antihistamine.  . Hyperlipidemia   . Hypertension   . Neuromuscular disorder ([place])    "multi system atrophy disease"- "pain, bilateral foot numbness, left knee pain"- Dr. Carles Collet is following.  . Ovarian cancer on left ([place]) 2019   serous borderline s/p BSO  . Pneumonia   . 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 ABDOMINAL HYSTERECTOMY     04-02-18 Dr. Gerarda Fraction  . CYSTOSCOPY WITH RETROGRADE PYELOGRAM, URETEROSCOPY AND STENT PLACEMENT Bilateral 05/04/2018   Procedure: CYSTOSCOPY WITH RETROGRADE PYELOGRAM, URETEROSCOPY AND STENT PLACEMENT;  Surgeon: Cleon Gustin, MD;  Location: [facility];  Service: Urology;  Laterality: Bilateral;  . DILATION AND CURETTAGE, DIAGNOSTIC / THERAPEUTIC     1981 and  1998 due to missed AB  . ESOPHAGOGASTRODUODENOSCOPY ENDOSCOPY     with esophageal dilation  . FOOT SURGERY  2011   3rd metatarsal LT foot  . KNEE ARTHROSCOPY Left 02/08/2015   Procedure: LEFT KNEE ARTHROSCOPY ;  Surgeon: Latanya Maudlin, MD;  Location[facility];  Service: Orthopedics;  Laterality: Left;  . LAPAROTOMY N/A 04/02/2018   Procedure: EXPLORATORY LAPAROTOMY;  Surgeon: Isabel Caprice, MD;  Location: [facility];  Service: Gynecology;  Laterality: N/A;  . SALPINGOOPHORECTOMY Bilateral 04/02/2018   Procedure: BILATERAL SALPINGO OOPHORECTOMY;  Surgeon: Isabel Caprice, MD;  Location: [facility];  Service: Gynecology;  Laterality: Bilateral;  . TONSILLECTOMY AND ADENOIDECTOMY    . TOTAL KNEE ARTHROPLASTY Left 08/22/2015   Procedure: LEFT TOTAL  KNEE  ARTHROPLASTY;  Surgeon: Latanya Maudlin, MD;  Location: [facility];  Service: Orthopedics;  Laterality: Left;    There were no vitals filed for this visit.  Subjective Assessment - 06/23/18 1453    Subjective  No falls; no changes. Wants to wrap up PT next session. Wants to participate in Parkinson's Art program. Knows what exercises she should be doing and has had the queen bed cleared off she uses for exercises. Wants to return to pool exercise, but has been told her BP is still too high to do OT pool therapy. Reports  she does not need further education on fall prevention.     Pertinent History  surgery to remove abdominal mass 04/02/18, Hx of MSA; knee arthritis    Patient Stated Goals  Wants to work on balance and stamina.    Currently in Pain?  No/denies         [facility] PT Assessment - 06/23/18 1510      6 Minute Walk- Baseline   6 Minute Walk- Baseline  yes    BP (mmHg)  (!) 157/101   denied headache or other symptoms   Modified Borg Scale for Dyspnea  0- Nothing at all    Perceived Rate of Exertion (Borg)  7- Very, very light      6 Minute walk- Post Test   6 Minute Walk Post Test  yes    BP (mmHg)  163/87    HR (bpm)  60    Modified Borg  Scale for Dyspnea  2- Mild shortness of breath    Perceived Rate of Exertion (Borg)  15- Hard      6 minute walk test results    Aerobic Endurance Distance Walked  403    Endurance additional comments  previous distance on 04/28/18 was 265 ft using U-step RW                      [facility]) - 06/23/18 2000    PWR! exercises  Moves in quadraped   modified quadruped at counter   PWR! Up  15    PWR! Rock  20    PWR! Twist  15    PWR! Step  20    Comments  bil UE support as needed; no LOB throughout; Excellent technique            PT Short Term Goals - 06/23/18 2025      PT SHORT TERM GOAL #1   Title  Pt will be independent with HEP for improved transfers, gait and balance.  TARGET 05/29/18  Updated TARGET 06/28/18 due to pt not being scheduled since eval    Time  4    Period  Weeks    Status  On-going      PT SHORT TERM GOAL #2   Title  Pt will improve TUG score to less than or equal to 30 seconds for decreased difficulty with ADLs at home, decreased fall risk.    Time  4    Period  Weeks    Status  On-going      PT SHORT TERM GOAL #3   Title  Pt will improve gait velocity to at least 1.8 ft/sec for improved gait efficiency and safety.    Time  4    Period  Weeks    Status  On-going      PT SHORT TERM GOAL #4   Title  Pt will verbalize understanding of fall prevention in the home environment.    Time  4    Period  Weeks    Status  Deferred        PT Long Term Goals - 06/23/18 2025      PT LONG TERM GOAL #1   Title  Pt will be independent with progression of HEP to address posture, balance, transfers and gait.  Updated TARGET 07/27/18    Time  8    Period  Weeks    Status  On-going      PT LONG TERM GOAL #2   Title  Pt will improve TUG cognitive score to less than or equal to 25 seconds for decreased fall risk.    Time  8    Period  Weeks    Status  On-going      PT LONG TERM GOAL #3   Title  Pt will improve gait velocity to at least 2 ft/sec  for improved gait efficiency and safety.    Time  8    Period  Weeks    Status  On-going      PT LONG TERM GOAL #4   Title  Pt will improve 3 minute walk test by 50 ft for improved efficiency and safety with gait.    Baseline  04/28/18 265 ft; 06/23/18  403 ft    Time  8    Period  Weeks    Status  Achieved      PT LONG TERM GOAL #5   Title  Pt will verbalize plans for ongoing community fitness upon D/C from PT, including possibilty of aquatic therapy if appropriate.    Time  8    Period  Weeks    Status  Achieved            Plan - 06/23/18 2017    Clinical Impression Statement  Began to check STGs as pt plans for next session to be her last PT session. Patient increased her 3 minute walk distance and demonstated correct technique for recently added exercises to HEP. Patient deferred fall prevention education stating she's "had that before and it's really just common sense." Patient instructed to bring any further questions to next/last session and remaining measures will be reassessed.     Rehab Potential  Good    PT Frequency  2x / week    PT Duration  8 weeks    PT Treatment/Interventions  ADLs/Self Care Home Management;Aquatic Therapy;Gait training;Stair training;Functional mobility training;Therapeutic activities;Therapeutic exercise;Balance training;Patient/family education;Neuromuscular re-education    PT Next Visit Plan  give her results of 3 min walk test (265 ft to 403 ft)  check STGs and discharge    Consulted and Agree with Plan of Care  Patient       Patient will benefit from skilled therapeutic intervention in order to improve the following deficits and impairments:  Abnormal gait, Decreased activity tolerance, Decreased balance, Decreased endurance, Decreased mobility, Decreased strength, Difficulty walking, Postural dysfunction  Visit Diagnosis: Other symptoms and signs involving the nervous system  Other abnormalities of gait and mobility     Problem  List Patient Active Problem List   Diagnosis Date Noted  . Hydronephrosis 05/04/2018  . Flank pain 05/04/2018  . Intra-abdominal and pelvic swelling, mass and lump, unspecified site 04/02/2018  . Pelvic mass 03/14/2018  . Dyspnea on exertion 08/05/2017  . Hyperlipidemia 04/23/2016  . Arthritis 04/23/2016  . 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 Postoperative anemia due to acute blood loss 08/29/2015  . 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 Gait difficulty 12/29/2013  . Back pain 12/29/2013  . Paresthesia 12/19/2012  . Essential hypertension 11/28/2012  . Polypharmacy 11/28/2012  . GERD (gastroesophageal reflux disease) 11/28/2012    Rexanne Mano, PT 06/23/2018, 8:26 PM  [facility] [address] [place], [name], [address] Phone: [phone]   Fax:  [phone]  Name: Jacqueline Atkinson MRN: [id number] Date of Birth: April 24, 1956

## 2018-06-23 NOTE — Therapy (Signed)
[name], [name], [address] Phone: [phone]   Fax:  [phone]  Speech Language Pathology Treatment  Patient Details  Name: Jacqueline Atkinson MRN: [id number] Date of Birth: [birth year] Referring Provider: Dr. Wells Guiles Tat   Encounter Date: 06/23/2018  End of Session - 06/23/18 1611    Visit Number  10    Number of Visits  17    Date for SLP Re-Evaluation  07/13/18    SLP Start Time  1404    SLP Stop Time   1445    SLP Time Calculation (min)  41 min    Activity Tolerance  Patient tolerated treatment well       Past Medical History:  Diagnosis Date  . Anemia    hx of  . Anxiety   . Arthritis    Left knee  osteoarthritis  . GERD (gastroesophageal reflux disease)   . Heart murmur   . History of hiatal hernia    small  . Hx of seasonal allergies    occ. use OTC Mucinex or Antihistamine.  . Hyperlipidemia   . Hypertension   . Neuromuscular disorder ([place])    "multi system atrophy disease"- "pain, bilateral foot numbness, left knee pain"- Dr. Carles Collet is following.  . Ovarian cancer on left ([place]) 2019   serous borderline s/p BSO  . Pneumonia   . 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 ABDOMINAL HYSTERECTOMY     04-02-18 Dr. Gerarda Fraction  . CYSTOSCOPY WITH RETROGRADE PYELOGRAM, URETEROSCOPY AND STENT PLACEMENT Bilateral 05/04/2018   Procedure: CYSTOSCOPY WITH RETROGRADE PYELOGRAM, URETEROSCOPY AND STENT PLACEMENT;  Surgeon: Cleon Gustin, MD;  Location: [facility];  Service: Urology;  Laterality: Bilateral;  . DILATION AND CURETTAGE, DIAGNOSTIC / THERAPEUTIC     1981 and 1998 due to missed AB  . ESOPHAGOGASTRODUODENOSCOPY ENDOSCOPY     with esophageal dilation  . FOOT SURGERY  2011   3rd metatarsal LT foot  . KNEE ARTHROSCOPY Left 02/08/2015   Procedure:  LEFT KNEE ARTHROSCOPY ;  Surgeon: Latanya Maudlin, MD;  Location: [facility];  Service: Orthopedics;  Laterality: Left;  . LAPAROTOMY N/A 04/02/2018   Procedure: EXPLORATORY LAPAROTOMY;  Surgeon: Isabel Caprice, MD;  Location: [facility];  Service: Gynecology;  Laterality: N/A;  . SALPINGOOPHORECTOMY Bilateral 04/02/2018   Procedure: BILATERAL SALPINGO OOPHORECTOMY;  Surgeon: Isabel Caprice, MD;  Location: [facility];  Service: Gynecology;  Laterality: Bilateral;  . TONSILLECTOMY AND ADENOIDECTOMY    . TOTAL KNEE ARTHROPLASTY Left 08/22/2015   Procedure: LEFT TOTAL  KNEE  ARTHROPLASTY;  Surgeon: Latanya Maudlin, MD;  Location: [facility];  Service: Orthopedics;  Laterality: Left;    There were no vitals filed for this visit.         ADULT SLP TREATMENT - 06/23/18 1427      General Information   Behavior/Cognition  Alert;Cooperative;Pleasant mood      Treatment Provided   Treatment provided  Cognitive-Linquistic      Cognitive-Linquistic Treatment   Treatment focused on  Aphasia    Skilled Treatment  Pt's executive function (task organization/planning) task of creating itinerary for trip to [facility] was done WNL.  (Speech tx) SLP reviewed anomia compensations with pt and then told pt she could use one or all of them in converastions in and outside of clinic. Pt voiced understanding.  SLP engaged pt in conversation of 25 mintues re: mod complex topics with one episode anomia completed with extra time.       Assessment / Recommendations / Plan   Plan  Continue with current plan of care   likely d/c in next 1-2 sessions     Progression Toward Goals   Progression toward goals  Progressing toward goals       SLP Education - 06/23/18 1610    Education Details  anomia compensations    Methods  Explanation    Comprehension  Verbalized understanding;Returned demonstration       SLP Short Term Goals - 06/18/18 1553      SLP SHORT TERM GOAL #1   Title  Pt will complete complex sequential naming and  naming with parameters 8/10x with occasional min A    Status  Achieved      SLP SHORT TERM GOAL #2   Title  Pt will complete formal cognitive linguistic assessment and written assessment within 1st 3 visits    Status  Achieved      SLP SHORT TERM GOAL #3   Title  n/a      SLP SHORT TERM GOAL #4   Title  Pt will use external aids for medications and financial management with occasional min A over 2 sessions    Status  Achieved      SLP SHORT TERM GOAL #5   Title  Pt will perform moderately complex functional reasoning, problem solving, organizing tasks with 90% accuracy and occasional min A over 2 sessions    Status  Achieved       SLP Long Term Goals - 06/23/18 1429      SLP LONG TERM GOAL #1   Title  Pt will utilize compensatoins for dysnomia over 20 minute complex conversation for 3 episodes of dysnomia with rare min A    Status  Achieved      SLP LONG TERM GOAL #2   Title  Pt will alternate attention between 2 mildly complex cognitive linguistic tasks with 85% on each and occasional min A over two sessions    Status  Achieved      SLP LONG TERM GOAL #3   Title  Pt will utilize compensations for dysarthria as needed during 10 minute conversation over 2 sessions with rare min A    Status  Achieved      SLP LONG TERM GOAL #4   Title  Pt will ID errors in cognitive linguistic tasks 3/5x with rare min A over 2 sessions.     Baseline  06-18-18    Time  5    Period  Weeks    Status  On-going      SLP LONG TERM GOAL #5   Title  pt will demo compensatory measures (preplanning, etc) for therapy tasks requiring planning and organization 90% of the time    Status  Achieved       Plan - 06/23/18 1612    Clinical Impression Statement  Today pt with need for extra time (x1, not outside of WNL) for word finding in mod complex conversation, Pt's planning/organization homework re: itinerary to [facility] was WNL. Do not think pt requires re-eval with CLQT at this time. Likely d/c in next  1-2 visits. Pt remains very intelligible, consistently from session to session, at this time. Cognitive goal met, SLP will assess skills with emergent awareness. I recommend skillled ST to maximize verbal expression, cognition for improved independence, safety and  to reduce caregiver burden.     Speech Therapy Frequency  2x / week    Duration  --   8 weeks or 17 visits   Treatment/Interventions  Language facilitation;Environmental controls;Cueing hierarchy;Oral motor exercises;SLP instruction and feedback;Compensatory techniques;Cognitive reorganization;Functional tasks;Compensatory strategies;Internal/external aids;Multimodal communcation approach;Patient/family education    Potential to Achieve Goals  Fair    Potential Considerations  Medical prognosis       Patient will benefit from skilled therapeutic intervention in order to improve the following deficits and impairments:   Cognitive communication deficit  Aphasia  Dysphagia, unspecified type    Problem List Patient Active Problem List   Diagnosis Date Noted  . Hydronephrosis 05/04/2018  . Flank pain 05/04/2018  . Intra-abdominal and pelvic swelling, mass and lump, unspecified site 04/02/2018  . Pelvic mass 03/14/2018  . Dyspnea on exertion 08/05/2017  . Hyperlipidemia 04/23/2016  . Arthritis 04/23/2016  . 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 Postoperative anemia due to acute blood loss 08/29/2015  . 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 Gait difficulty 12/29/2013  . Back pain 12/29/2013  . Paresthesia 12/19/2012  . Essential hypertension 11/28/2012  . Polypharmacy 11/28/2012  . GERD (gastroesophageal reflux disease) 11/28/2012    [facility] ,MS, CCC-SLP  06/23/2018, 4:15 PM  [facility] [address] [place], [name], [address] Phone: [phone]   Fax:  [phone]   Name: Jacqueline Atkinson MRN: [id number] Date of Birth: [birth year]

## 2018-06-25 ENCOUNTER — Ambulatory Visit: Payer: Medicare Other | Admitting: Occupational Therapy

## 2018-06-25 ENCOUNTER — Ambulatory Visit: Payer: Medicare Other

## 2018-06-25 ENCOUNTER — Encounter: Payer: Self-pay | Admitting: Occupational Therapy

## 2018-06-25 VITALS — BP 118/69 | HR 68

## 2018-06-25 DIAGNOSIS — R2681 Unsteadiness on feet: Secondary | ICD-10-CM | POA: Diagnosis not present

## 2018-06-25 DIAGNOSIS — R41841 Cognitive communication deficit: Secondary | ICD-10-CM

## 2018-06-25 DIAGNOSIS — M25611 Stiffness of right shoulder, not elsewhere classified: Secondary | ICD-10-CM

## 2018-06-25 DIAGNOSIS — R29898 Other symptoms and signs involving the musculoskeletal system: Secondary | ICD-10-CM

## 2018-06-25 DIAGNOSIS — R293 Abnormal posture: Secondary | ICD-10-CM

## 2018-06-25 DIAGNOSIS — R29818 Other symptoms and signs involving the nervous system: Secondary | ICD-10-CM

## 2018-06-25 DIAGNOSIS — R4701 Aphasia: Secondary | ICD-10-CM

## 2018-06-25 DIAGNOSIS — R278 Other lack of coordination: Secondary | ICD-10-CM

## 2018-06-25 DIAGNOSIS — R2689 Other abnormalities of gait and mobility: Secondary | ICD-10-CM

## 2018-06-25 NOTE — Therapy (Addendum)
[facility] [address] [place], [name], [address] Phone: [phone]   Fax:  [phone]  Occupational Therapy Treatment  Patient Details  Name: Jacqueline Atkinson MRN: [id number] Date of Birth: 1955/12/23 Referring Provider: Dr. Wells Guiles Tat   Encounter Date: 06/25/2018  OT End of Session - 06/25/18 1325    Visit Number  5    Number of Visits  17    Date for OT Re-Evaluation  06/27/18    Authorization Type  [employer]    Authorization Time Period  cert. 04/28/18-07/27/18    Authorization - Visit Number  5    Authorization - Number of Visits  10    OT Start Time  1320    OT Stop Time  1400    OT Time Calculation (min)  40 min    Activity Tolerance  Patient tolerated treatment well    Behavior During Therapy  WFL for tasks assessed/performed       Past Medical History:  Diagnosis Date  . Anemia    hx of  . Anxiety   . Arthritis    Left knee  osteoarthritis  . GERD (gastroesophageal reflux disease)   . Heart murmur   . History of hiatal hernia    small  . Hx of seasonal allergies    occ. use OTC Mucinex or Antihistamine.  . Hyperlipidemia   . Hypertension   . Neuromuscular disorder ([place])    "multi system atrophy disease"- "pain, bilateral foot numbness, left knee pain"- Dr. Carles Collet is following.  . Ovarian cancer on left ([place]) 2019   serous borderline s/p BSO  . Pneumonia   . 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 ABDOMINAL HYSTERECTOMY     04-02-18 Dr. Gerarda Fraction  . CYSTOSCOPY WITH RETROGRADE PYELOGRAM, URETEROSCOPY AND STENT PLACEMENT Bilateral 05/04/2018   Procedure: CYSTOSCOPY WITH RETROGRADE PYELOGRAM, URETEROSCOPY AND STENT PLACEMENT;  Surgeon: Cleon Gustin, MD;  Location: [facility];  Service: Urology;  Laterality: Bilateral;  . DILATION AND CURETTAGE, DIAGNOSTIC  / THERAPEUTIC     1981 and 1998 due to missed AB  . ESOPHAGOGASTRODUODENOSCOPY ENDOSCOPY     with esophageal dilation  . FOOT SURGERY  2011   3rd metatarsal LT foot  . KNEE ARTHROSCOPY Left 02/08/2015   Procedure: LEFT KNEE ARTHROSCOPY ;  Surgeon: Latanya Maudlin, MD;  Location: [facility];  Service: Orthopedics;  Laterality: Left;  . LAPAROTOMY N/A 04/02/2018   Procedure: EXPLORATORY LAPAROTOMY;  Surgeon: Isabel Caprice, MD;  Location: [facility];  Service: Gynecology;  Laterality: N/A;  . SALPINGOOPHORECTOMY Bilateral 04/02/2018   Procedure: BILATERAL SALPINGO OOPHORECTOMY;  Surgeon: Isabel Caprice, MD;  Location: [facility];  Service: Gynecology;  Laterality: Bilateral;  . TONSILLECTOMY AND ADENOIDECTOMY    . TOTAL KNEE ARTHROPLASTY Left 08/22/2015   Procedure: LEFT TOTAL  KNEE  ARTHROPLASTY;  Surgeon: Latanya Maudlin, MD;  Location: [facility];  Service: Orthopedics;  Laterality: Left;    Vitals:   06/25/18 1323  BP: 118/69  Pulse: 68    Subjective Assessment - 06/25/18 1324    Limitations  fall risk, check BP    Patient Stated Goals  return to the pool, improve writing, improve coordination/R hand use, improve R shoulder rigidity    Currently in Pain?  No/denies      Discussed community fitness opportunities/resources.  Pt unable  to participate in [facility] pool program at this time due to day/time offered, unstable BP, and unable to have family stay with her.  Discussed going to pool with family to assist getting out and to monitor how she feels first few times.  Cautioned pt that being in the pool could incr fatigue initially and she should start with short period of time (20-30 min) in pool vs 1 hour to monitor fatigue and effects later in the day and the following day.  Pt also to bring BP cuff to monitor before/after pool.  Discussed that pt needs to call and see PCP to address BP fluctuations.  Pt verbalized understanding.  Writing 1/2 page of self-generated text with good legibility.  Cued  for frequent stretching emphasizing wrist ext with PWR! Hands, avoid hyperextension of MPs, and recommended shoulder abduction/ER with elbow/wrist/finger ext.  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  Pt reports visual deficits and that she may need eye surgery.  Recommended pt discuss with Dr. Carles Collet and surgeon for considerations due to [place].  Pt verbalized understanding.  Also discussed difficulty with use of phone for texting due to vision and decr coordination.  Suggested stylus or use of tablet.  Pt reports that she uses stand magnifier as well.    Discussed medical alert for falls.  Pt reports that she has a services but that she does not always wear due to fear of accidentally hitting it and/or EMS coming while she has fallen with dressing.  Discussed different services that call family first and recommended pt inquire if this is option on current service or whether another service would be best for her.                         OT Short Term Goals - 06/25/18 1416      OT SHORT TERM GOAL #1   Title  Pt will be independent with updated HEP.--check 05/28/18    Time  4    Period  Weeks    Status  Achieved   06/25/18:  met per pt     OT SHORT TERM GOAL #2   Title  Pt will verbalize understanding of appropriate community fitness opportunities (pool exercises, etc) and PD-related resources.    Time  4    Period  Weeks    Status  Achieved   06/25/18:  plans to attend PART, plans to go to pool with dtr       OT Long Term Goals - 06/25/18 1327      OT LONG TERM GOAL #1   Title  Pt will be verbalize understanding of adaptive strategies/AE for ADLs/IADLs to incr safety and ease.--check LTGs 07/27/18    Time  8    Period  Weeks    Status  On-going      OT LONG TERM GOAL #2   Title  Pt will improve dominant RUE functional reaching and coordination as shown by improving score on box and blocks test by at least 5.     Baseline  R-47, L-60 blocks    Time  8    Period  Weeks    Status  New      OT LONG TERM GOAL #3   Title  Pt will demo at least 145* R shoulder flexion for improved RUE reaching/rigidity.    Baseline  R-140*, L-150*    Time  8  Period  Weeks    Status  New      OT LONG TERM GOAL #4   Title  Pt will be able to write at least 1/2 page with good legibility for leisure tasks.    Time  8    Period  Weeks    Status  On-going   06/25/18:  1/2 page with good legibility           Plan - 06/25/18 1326    Clinical Impression Statement  Pt is progressing toward goals.  Pt reports that HEP is going well.  Will attempt to paint tomorrow.    Occupational performance deficits (Please refer to evaluation for details):  ADL's;IADL's;Social Participation;Leisure    Rehab Potential  Fair    OT Frequency  2x / week    OT Treatment/Interventions  Aquatic Therapy;Self-care/ADL training;Cryotherapy;Paraffin;Therapeutic exercise;DME and/or AE instruction;Functional Mobility Training;Cognitive remediation/compensation;Balance training;Visual/perceptual remediation/compensation;Manual Therapy;Neuromuscular education;Fluidtherapy;Moist Heat;Ultrasound;Energy conservation;Passive range of motion;Therapeutic activities;Patient/family education    Plan  check goals, address further ADL strategies; schedule follow-up    Consulted and Agree with Plan of Care  Patient       Patient will benefit from skilled therapeutic intervention in order to improve the following deficits and impairments:  Decreased balance, Decreased endurance, Decreased mobility, Impaired sensation, Impaired vision/preception, Impaired tone, Decreased range of motion, Decreased cognition, Decreased activity tolerance, Decreased coordination, Decreased knowledge of use of DME, Impaired UE functional use, Improper spinal/pelvic alignment, Decreased strength, Impaired flexibility, Decreased safety awareness  Visit Diagnosis: Other lack of  coordination  Other symptoms and signs involving the nervous system  Other abnormalities of gait and mobility  Other symptoms and signs involving the musculoskeletal system  Stiffness of right shoulder, not elsewhere classified  Abnormal posture  Unsteadiness on feet    Problem List Patient Active Problem List   Diagnosis Date Noted  . Hydronephrosis 05/04/2018  . Flank pain 05/04/2018  . Intra-abdominal and pelvic swelling, mass and lump, unspecified site 04/02/2018  . Pelvic mass 03/14/2018  . Dyspnea on exertion 08/05/2017  . Hyperlipidemia 04/23/2016  . Arthritis 04/23/2016  . 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 Postoperative anemia due to acute blood loss 08/29/2015  . 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 Gait difficulty 12/29/2013  . Back pain 12/29/2013  . Paresthesia 12/19/2012  . Essential hypertension 11/28/2012  . Polypharmacy 11/28/2012  . GERD (gastroesophageal reflux disease) 11/28/2012    [facility] 06/25/2018, 6:16 PM  [facility] [address] [place], [name], [address] Phone: [phone]   Fax:  [phone]  Name: HAN LYSNE MRN: [id number] Date of Birth: [birth year]   Vianne Bulls, OTR/L [facility] [address] [place], [place]  [address] [phone] phone [phone] 06/25/18 6:16 PM

## 2018-06-25 NOTE — Therapy (Signed)
[facility] [address] [place], [name], [address] Phone: [phone]   Fax:  [phone]  Speech Language Pathology Treatment/Discharge  Patient Details  Name: Jacqueline Atkinson MRN: [id number] Date of Birth: Dec 30, 1955 Referring Provider: Dr. Wells Guiles Tat   Encounter Date: 06/25/2018  End of Session - 06/25/18 1658    Visit Number  11    Number of Visits  17    Date for SLP Re-Evaluation  07/13/18    SLP Start Time  4008    SLP Stop Time   1445    SLP Time Calculation (min)  42 min    Activity Tolerance  Patient tolerated treatment well       Past Medical History:  Diagnosis Date  . Anemia    hx of  . Anxiety   . Arthritis    Left knee  osteoarthritis  . GERD (gastroesophageal reflux disease)   . Heart murmur   . History of hiatal hernia    small  . Hx of seasonal allergies    occ. use OTC Mucinex or Antihistamine.  . Hyperlipidemia   . Hypertension   . Neuromuscular disorder ([place])    "multi system atrophy disease"- "pain, bilateral foot numbness, left knee pain"- Dr. Carles Collet is following.  . Ovarian cancer on left ([place]) 2019   serous borderline s/p BSO  . Pneumonia   . 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 ABDOMINAL HYSTERECTOMY     04-02-18 Dr. Gerarda Fraction  . CYSTOSCOPY WITH RETROGRADE PYELOGRAM, URETEROSCOPY AND STENT PLACEMENT Bilateral 05/04/2018   Procedure: CYSTOSCOPY WITH RETROGRADE PYELOGRAM, URETEROSCOPY AND STENT PLACEMENT;  Surgeon: Cleon Gustin, MD;  Location: [facility];  Service: Urology;  Laterality: Bilateral;  . DILATION AND CURETTAGE, DIAGNOSTIC / THERAPEUTIC     1981 and 1998 due to missed AB  . ESOPHAGOGASTRODUODENOSCOPY ENDOSCOPY     with esophageal dilation  . FOOT SURGERY  2011   3rd metatarsal LT foot  . KNEE ARTHROSCOPY Left 02/08/2015   Procedure: LEFT KNEE ARTHROSCOPY ;  Surgeon: Latanya Maudlin, MD;  Location: [facility];  Service: Orthopedics;  Laterality: Left;  . LAPAROTOMY N/A 04/02/2018   Procedure: EXPLORATORY LAPAROTOMY;  Surgeon: Isabel Caprice, MD;  Location: [facility];  Service: Gynecology;  Laterality: N/A;  . SALPINGOOPHORECTOMY Bilateral 04/02/2018   Procedure: BILATERAL SALPINGO OOPHORECTOMY;  Surgeon: Isabel Caprice, MD;  Location: [facility];  Service: Gynecology;  Laterality: Bilateral;  . TONSILLECTOMY AND ADENOIDECTOMY    . TOTAL KNEE ARTHROPLASTY Left 08/22/2015   Procedure: LEFT TOTAL  KNEE  ARTHROPLASTY;  Surgeon: Latanya Maudlin, MD;  Location: [facility];  Service: Orthopedics;  Laterality: Left;    There were no vitals filed for this visit.  Subjective Assessment - 06/25/18 1416    Subjective  Pt was just in OT and used description strategy.    Currently in Pain?  No/denies            ADULT SLP TREATMENT - 06/25/18 1415      General Information   Behavior/Cognition  Alert;Cooperative;Pleasant mood      Treatment Provided   Treatment provided  Cognitive-Linquistic      Cognitive-Linquistic Treatment   Treatment focused on  Cognition    Skilled Treatment  "I'm not seeing any connections yet so I'm going to slow down and look at it again." (  pt after second pass at deductive reasoning clues). SLP provided a deductive reasoning task for pt today and she organized correctly and found one error made. Pt copmleted "organizing your day" handout with adequate time and preplanning. She found errors as they occurred x2. Pt agrees to d/c today as she has met all goals.       Assessment / Recommendations / Plan   Plan  Discharge SLP treatment due to (comment)   see goals - d/c day     Progression Toward Goals   Progression toward goals  --   d/c day - see goals summary        SLP Short Term Goals - 06/18/18 1553      SLP SHORT TERM GOAL #1   Title  Pt will complete complex sequential naming and naming  with parameters 8/10x with occasional min A    Status  Achieved      SLP SHORT TERM GOAL #2   Title  Pt will complete formal cognitive linguistic assessment and written assessment within 1st 3 visits    Status  Achieved      SLP SHORT TERM GOAL #3   Title  n/a      SLP SHORT TERM GOAL #4   Title  Pt will use external aids for medications and financial management with occasional min A over 2 sessions    Status  Achieved      SLP SHORT TERM GOAL #5   Title  Pt will perform moderately complex functional reasoning, problem solving, organizing tasks with 90% accuracy and occasional min A over 2 sessions    Status  Achieved       SLP Long Term Goals - 06/25/18 1418      SLP LONG TERM GOAL #1   Title  Pt will utilize compensatoins for dysnomia over 20 minute complex conversation for 3 episodes of dysnomia with rare min A    Status  Achieved      SLP LONG TERM GOAL #2   Title  Pt will alternate attention between 2 mildly complex cognitive linguistic tasks with 85% on each and occasional min A over two sessions    Status  Achieved      SLP LONG TERM GOAL #3   Title  Pt will utilize compensations for dysarthria as needed during 10 minute conversation over 2 sessions with rare min A    Status  Achieved      SLP LONG TERM GOAL #4   Title  Pt will ID errors in cognitive linguistic tasks 3/5x with rare min A over 2 sessions.     Baseline  --    Time  --    Period  --    Status  Achieved      SLP LONG TERM GOAL #5   Title  pt will demo compensatory measures (preplanning, etc) for therapy tasks requiring planning and organization 90% of the time    Status  Achieved       Plan - 06/25/18 1658    Clinical Impression Statement  Today pt ID'd errors while completing tasks. Pt remains very intelligible, consistently from session to session, at this time. I recommend d/c of skillled ST at this time.    Speech Therapy Frequency  2x / week    Duration  --   8 weeks or 17 visits    Treatment/Interventions  Language facilitation;Environmental controls;Cueing hierarchy;Oral motor exercises;SLP instruction and feedback;Compensatory techniques;Cognitive reorganization;Functional tasks;Compensatory strategies;Internal/external aids;Multimodal communcation approach;Patient/family education  Potential to Achieve Goals  Fair    Potential Considerations  Medical prognosis       Patient will benefit from skilled therapeutic intervention in order to improve the following deficits and impairments:   Cognitive communication deficit  Aphasia   SPEECH THERAPY DISCHARGE SUMMARY  Visits from Start of Care: 11  Current functional level related to goals / functional outcomes: See goal summary above. Pt met all LTGs.   Remaining deficits: Mild dysarthria, transient, when pt overtly fatigued. Mild anomia which pt uses compensations for successfully.    Education / Equipment: Compensations for aphasia, compensations for speech. Plan: Patient agrees to discharge.  Patient goals were not met. Patient is being discharged due to meeting the stated rehab goals.  ?????       Problem List Patient Active Problem List   Diagnosis Date Noted  . Hydronephrosis 05/04/2018  . Flank pain 05/04/2018  . Intra-abdominal and pelvic swelling, mass and lump, unspecified site 04/02/2018  . Pelvic mass 03/14/2018  . Dyspnea on exertion 08/05/2017  . Hyperlipidemia 04/23/2016  . Arthritis 04/23/2016  . 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 Postoperative anemia due to acute blood loss 08/29/2015  . 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 Gait difficulty 12/29/2013  . Back pain 12/29/2013  . Paresthesia 12/19/2012  . Essential hypertension 11/28/2012  . Polypharmacy 11/28/2012  . GERD (gastroesophageal reflux disease) 11/28/2012    [facility] ,MS, CCC-SLP  06/25/2018, 5:00 PM  [facility] [address] [place], [name], [address] Phone: [phone]   Fax:  [phone]   Name: Jacqueline Atkinson MRN: [id number] Date of Birth: [birth year]

## 2018-06-30 ENCOUNTER — Ambulatory Visit: Payer: Medicare Other | Admitting: Physical Therapy

## 2018-07-02 ENCOUNTER — Encounter: Payer: Medicare Other | Admitting: Occupational Therapy

## 2018-07-06 DIAGNOSIS — K8020 Calculus of gallbladder without cholecystitis without obstruction: Secondary | ICD-10-CM | POA: Diagnosis not present

## 2018-07-06 DIAGNOSIS — N131 Hydronephrosis with ureteral stricture, not elsewhere classified: Secondary | ICD-10-CM | POA: Diagnosis not present

## 2018-07-18 ENCOUNTER — Other Ambulatory Visit: Payer: Self-pay | Admitting: Family Medicine

## 2018-07-20 NOTE — Telephone Encounter (Signed)
Patient called, left VM to return call to schedule an office visit with Dr. Brigitte Pulse in November in order to continue to receive medication refills.

## 2018-07-30 ENCOUNTER — Encounter: Payer: Medicare Other | Admitting: Occupational Therapy

## 2018-08-07 DIAGNOSIS — N131 Hydronephrosis with ureteral stricture, not elsewhere classified: Secondary | ICD-10-CM | POA: Diagnosis not present

## 2018-08-14 DIAGNOSIS — N3946 Mixed incontinence: Secondary | ICD-10-CM | POA: Diagnosis not present

## 2018-08-14 DIAGNOSIS — R351 Nocturia: Secondary | ICD-10-CM | POA: Diagnosis not present

## 2018-08-16 ENCOUNTER — Other Ambulatory Visit: Payer: Self-pay | Admitting: Neurology

## 2018-08-17 DIAGNOSIS — R1310 Dysphagia, unspecified: Secondary | ICD-10-CM | POA: Diagnosis not present

## 2018-09-03 ENCOUNTER — Other Ambulatory Visit: Payer: Self-pay | Admitting: Family Medicine

## 2018-09-09 ENCOUNTER — Other Ambulatory Visit: Payer: Self-pay | Admitting: Family Medicine

## 2018-09-09 MED ORDER — CLONIDINE HCL 0.2 MG PO TABS: 0.2000 mg | ORAL_TABLET | Freq: Two times a day (BID) | ORAL | 0 refills | Status: DC

## 2018-09-09 NOTE — Telephone Encounter (Signed)
Copied from [place] [id number]. Topic: General - Other >> Sep 09, 2018 11:15 AM Lennox Solders wrote: Reason for CRM: pt is calling she has been out of clonidine and per pt pharm has requesting. Pt has an appt with dr Brigitte Pulse on 09-28-18.  [place] [address]

## 2018-09-11 ENCOUNTER — Ambulatory Visit (INDEPENDENT_AMBULATORY_CARE_PROVIDER_SITE_OTHER): Payer: Medicare Other | Admitting: Neurology

## 2018-09-11 DIAGNOSIS — G5139 Clonic hemifacial spasm, unspecified: Secondary | ICD-10-CM

## 2018-09-11 MED ORDER — ONABOTULINUMTOXINA 100 UNITS IJ SOLR: 23.0000 [IU] | Freq: Once | INTRAMUSCULAR | Status: DC

## 2018-09-11 MED ORDER — ONABOTULINUMTOXINA 100 UNITS IJ SOLR
12.0000 [IU] | Freq: Once | INTRAMUSCULAR | Status: AC
  Administered 2018-09-11: 12 [IU] via INTRAMUSCULAR

## 2018-09-11 NOTE — Procedures (Signed)
[facility]   History:  Diagnosis: Hemifacial spasm   Initial side: right   Result History  Pt reports that it helped.   Consent obtained from: The patient Benefits discussed included, but were not limited to decreased muscle tightness, increased joint range of motion, and decreased pain.  Risk discussed included, but were not limited pain and discomfort, bleeding, bruising, excessive weakness, venous thrombosis, muscle atrophy and dysphagia.  A copy of the patient medication guide was given to the patient which explains the blackbox warning.  Patients identity and treatment sites confirmed Yes.  .  Details of Procedure: Skin was cleaned with alcohol.  A 30 gauge, 1/2 inch needle was introduced to target mm.  Prior to injection, the needle plunger was aspirated to make sure the needle was not within a blood vessel.  There was no blood retrieved on aspiration.    Following is a summary of the muscles injected  And the amount of Botulinum toxin used:  Injections  Location Left  Right Units Number of sites        Corrugator      Frontalis      Lower Lid, Lateral  2.5 2.5   Lower Lid Medial      Upper Lid, Lateral  2.5 2.5   Upper Lid, Medial      Canthus  5.0 5.0   nasalis  2.5 2.5   Masseter      Procerus      Zygomaticus Major      TOTAL UNITS:   12.5    Agent: Botulinum Type A ( Onobotulinum Toxin type A ).  1 vials of Botox were used, each containing 50 units and freshly diluted with 2 mL of sterile, non-perserved saline   Total injected (Units): 12.5  Total wasted (Units): 0 Pt tolerated procedure well without complications.   Reinjection is anticipated in 3 months.

## 2018-09-14 ENCOUNTER — Ambulatory Visit: Payer: Medicare Other | Admitting: Neurology

## 2018-09-21 DIAGNOSIS — K219 Gastro-esophageal reflux disease without esophagitis: Secondary | ICD-10-CM | POA: Diagnosis not present

## 2018-09-21 DIAGNOSIS — K210 Gastro-esophageal reflux disease with esophagitis: Secondary | ICD-10-CM | POA: Diagnosis not present

## 2018-09-21 DIAGNOSIS — K449 Diaphragmatic hernia without obstruction or gangrene: Secondary | ICD-10-CM | POA: Diagnosis not present

## 2018-09-21 DIAGNOSIS — R1310 Dysphagia, unspecified: Secondary | ICD-10-CM | POA: Diagnosis not present

## 2018-09-21 DIAGNOSIS — K209 Esophagitis, unspecified: Secondary | ICD-10-CM | POA: Diagnosis not present

## 2018-09-27 IMAGING — US US RENAL
1 series · 14 of 25 positions shown · non-contrast
Comparison: CT abdomen and pelvis May 04, 2018

CLINICAL DATA: Worsening pain after stent placement May 04, 2018

EXAM:
RENAL / URINARY TRACT ULTRASOUND COMPLETE

[Series 1: us renal · 0.26mm/px · 14 of 27 slices shown]
[im 1/27]
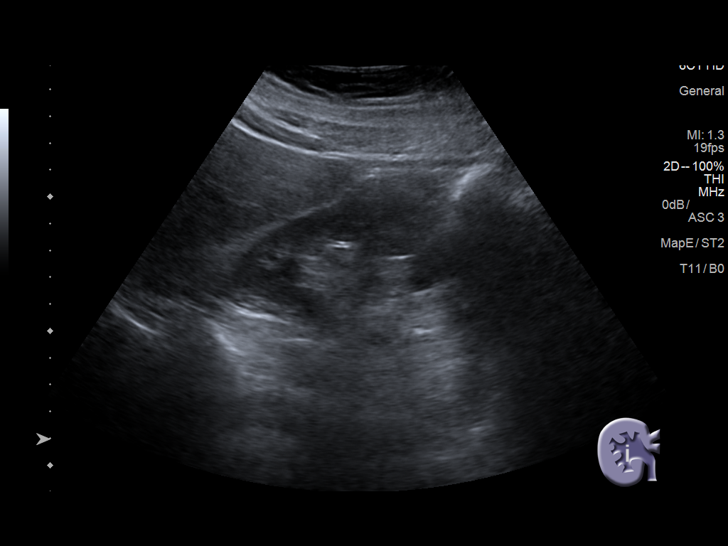
[im 3/27]
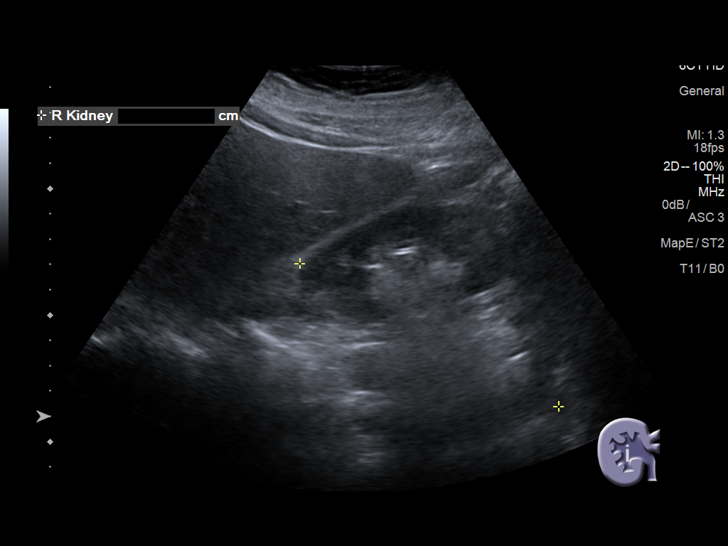
[im 5/27]
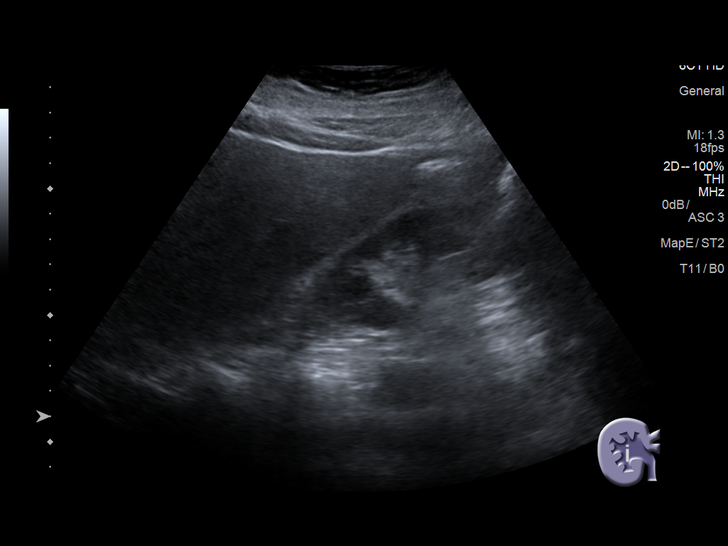
[im 7/27]
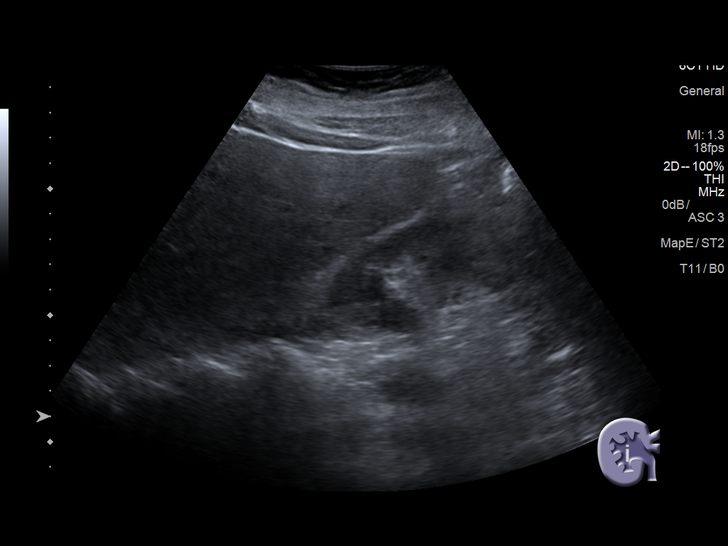
[im 9/27]
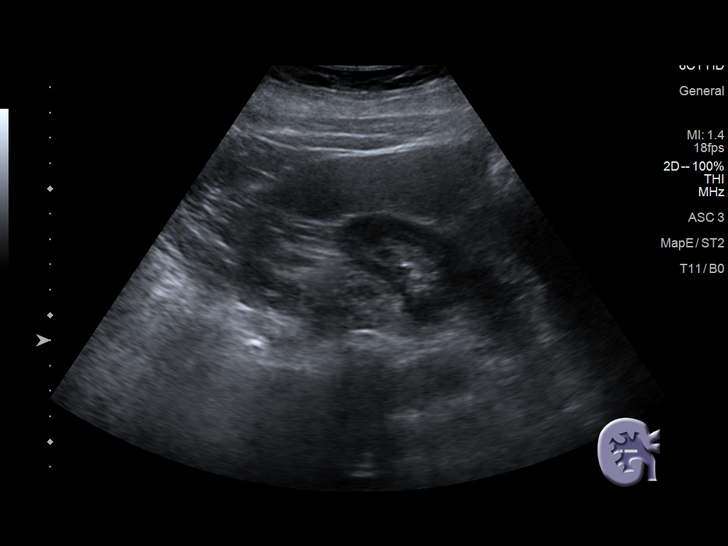
[im 10/27]
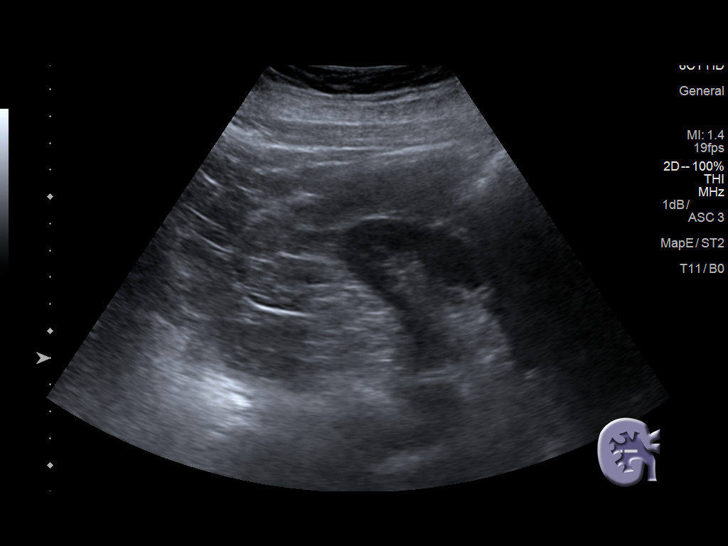
[im 12/27]
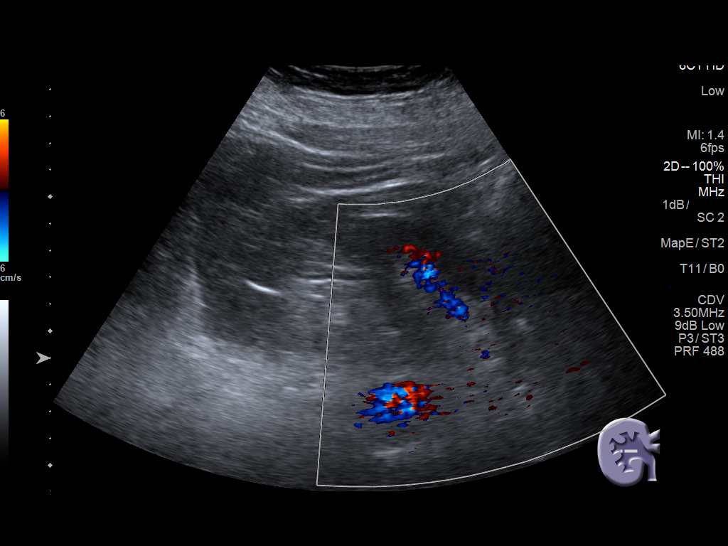
[im 15/27]
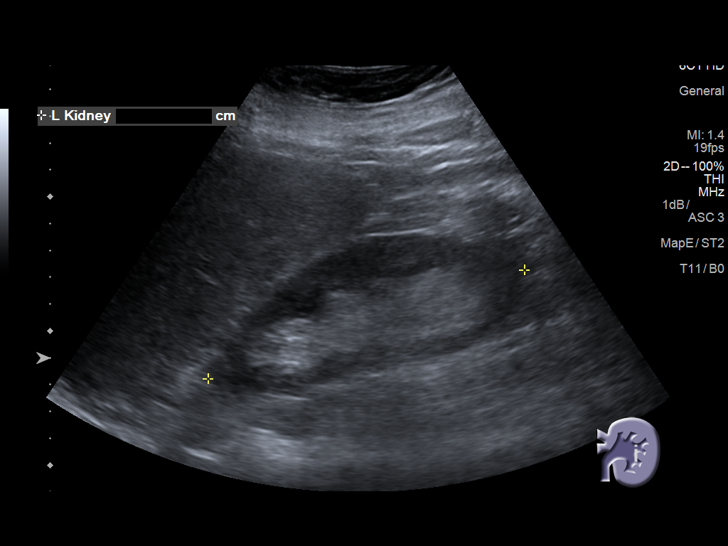
[im 17/27]
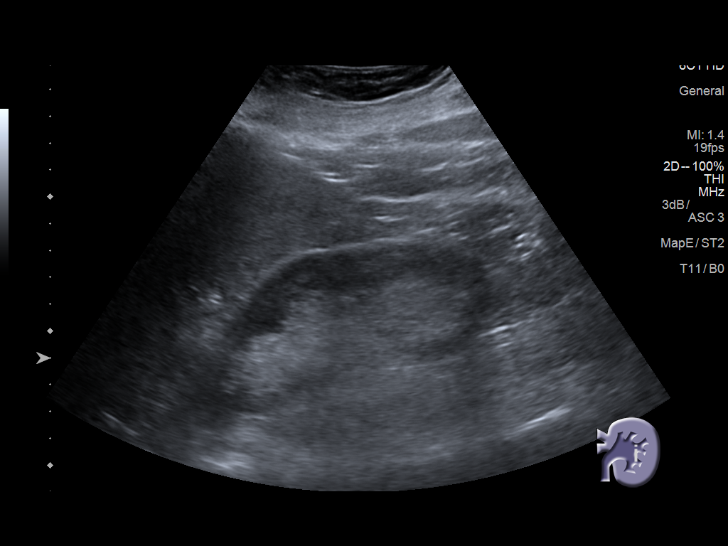
[im 18/27]
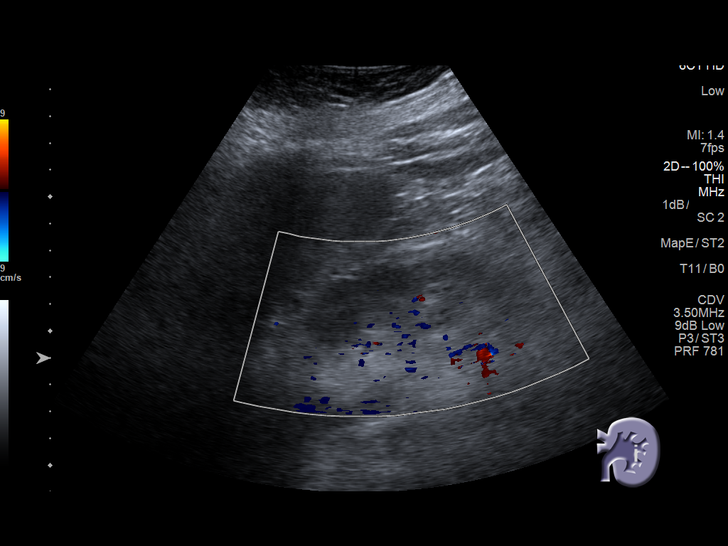
[im 20/27]
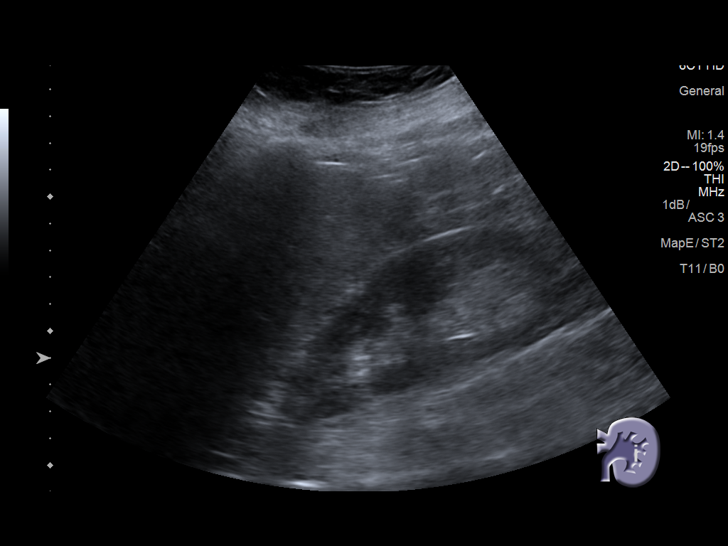
[im 22/27]
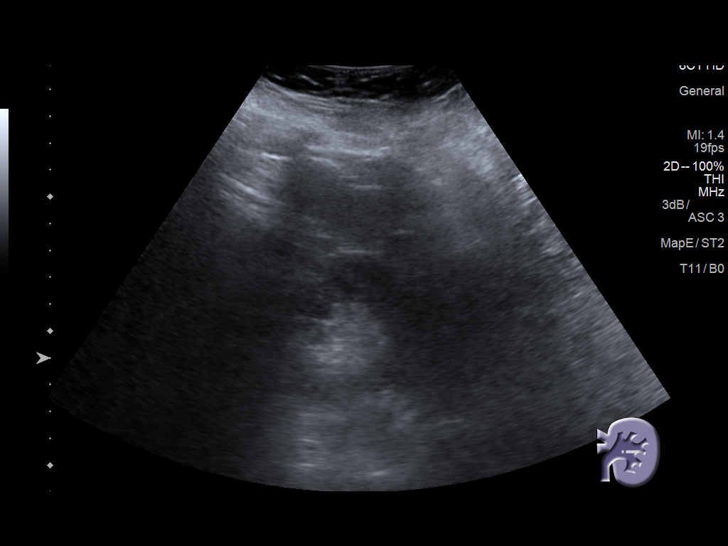
[im 24/27]
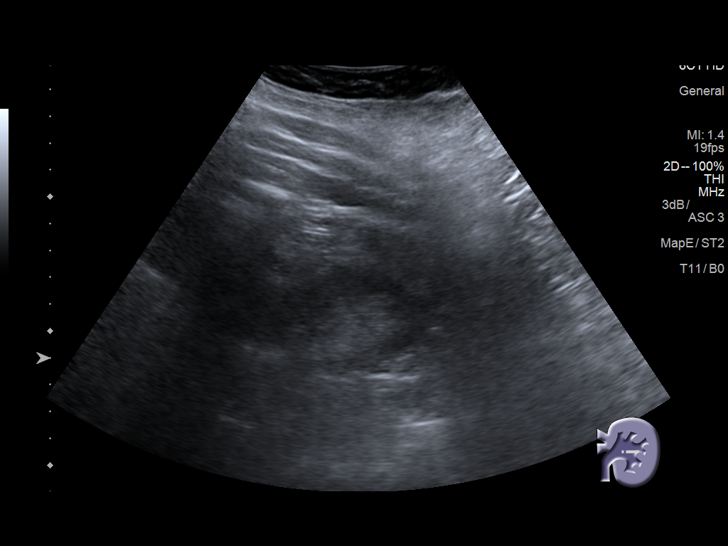
[im 27/27]
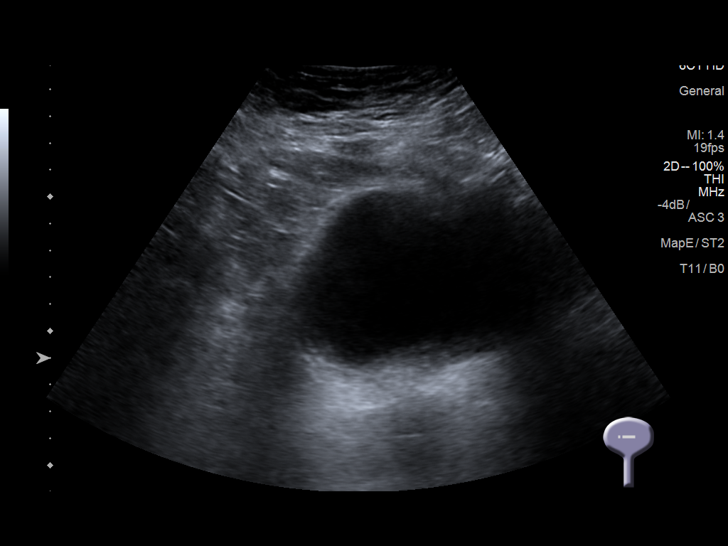

[14 of 25 positions shown; findings below may reference images not displayed]

FINDINGS: Right Kidney:

Length: 11.7 cm. Echogenicity within normal limits. No mass or
hydronephrosis visualized.

Left Kidney:

Length: 12.5 cm. Echogenicity within normal limits. No mass or
hydronephrosis visualized. No sonographically identified stent.

Bladder:

Appears normal for degree of bladder distention.
IMPRESSION: Negative renal ultrasound.

## 2018-09-28 ENCOUNTER — Ambulatory Visit: Payer: Medicare Other | Admitting: Family Medicine

## 2018-09-28 ENCOUNTER — Other Ambulatory Visit: Payer: Self-pay

## 2018-09-28 ENCOUNTER — Encounter: Payer: Self-pay | Admitting: Family Medicine

## 2018-09-28 VITALS — BP 129/73 | HR 65 | Temp 97.8°F | Resp 16 | Ht 68.0 in | Wt 235.0 lb

## 2018-09-28 DIAGNOSIS — Z23 Encounter for immunization: Secondary | ICD-10-CM

## 2018-09-28 DIAGNOSIS — G238 Other specified degenerative diseases of basal ganglia: Secondary | ICD-10-CM | POA: Diagnosis not present

## 2018-09-28 DIAGNOSIS — H18529 Epithelial (juvenile) corneal dystrophy, unspecified eye: Secondary | ICD-10-CM

## 2018-09-28 DIAGNOSIS — R682 Dry mouth, unspecified: Secondary | ICD-10-CM

## 2018-09-28 DIAGNOSIS — K5909 Other constipation: Secondary | ICD-10-CM

## 2018-09-28 DIAGNOSIS — H1859 Other hereditary corneal dystrophies: Secondary | ICD-10-CM

## 2018-09-28 DIAGNOSIS — I10 Essential (primary) hypertension: Secondary | ICD-10-CM

## 2018-09-28 DIAGNOSIS — H04129 Dry eye syndrome of unspecified lacrimal gland: Secondary | ICD-10-CM

## 2018-09-28 MED ORDER — TELMISARTAN 80 MG PO TABS: 80.0000 mg | ORAL_TABLET | Freq: Every day | ORAL | 3 refills | Status: DC

## 2018-09-28 MED ORDER — BIOTENE DRY MOUTH MT LIQD: 15.0000 mL | OROMUCOSAL | 5 refills | Status: DC | PRN

## 2018-09-28 MED ORDER — CLONIDINE HCL 0.2 MG PO TABS: ORAL_TABLET | ORAL | 3 refills | Status: DC

## 2018-09-28 NOTE — Progress Notes (Addendum)
Subjective:    Patient: Jacqueline Atkinson  DOB: [birth year]; 62 y.o.   MRN: [id number]  Chief Complaint  Patient presents with  . Chronic Conditions   HPI Has 7 kids all local - 11 grandkids  BP overall has been really good - when it spikes it is irregular up to 150s/110s and  Clonidine works great and makes her sleepy during the day so takes 2 at night and 1 during the da whhich sh ecan tolerate much better.   Bowels are a constant battle- mixing prune juice, appel sauce and wheat germ.  She will go up to a week without a BM - then she tries every high fiber she can then she will get some relief.  Stool is always soft.  A lot if exercise, when she is not able to get out and walk.  She has had several falls where her face just goes into wall . Her walker doesn't tip easily as so heavy.   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 Anemia    hx of  . Anxiety   . Arthritis    Left knee  osteoarthritis  . GERD (gastroesophageal reflux disease)   . Heart murmur   . History of hiatal hernia    small  . Hx of seasonal allergies    occ. use OTC Mucinex or Antihistamine.  . Hyperlipidemia   . Hypertension   . Neuromuscular disorder ([place])    "multi system atrophy disease"- "pain, bilateral foot numbness, left knee pain"- Dr. Carles Collet is following.  . Ovarian cancer on left ([place]) 2019   serous borderline s/p BSO  . Pneumonia   . Shoulder disorder    right shoulder "rigidity due to Multi system atrophy"- ROM not limited"just tires me"   Past Surgical History:  Procedure Laterality Date  . ABDOMINAL HYSTERECTOMY     04-02-18 Dr. Gerarda Fraction  . CYSTOSCOPY WITH RETROGRADE PYELOGRAM, URETEROSCOPY AND STENT PLACEMENT Bilateral 05/04/2018   Procedure: CYSTOSCOPY WITH RETROGRADE PYELOGRAM, URETEROSCOPY AND STENT PLACEMENT;  Surgeon: Cleon Gustin, MD;  Location: [facility];  Service: Urology;  Laterality:  Bilateral;  . DILATION AND CURETTAGE, DIAGNOSTIC / THERAPEUTIC     1981 and 1998 due to missed AB  . ESOPHAGOGASTRODUODENOSCOPY ENDOSCOPY     with esophageal dilation  . FOOT SURGERY  2011   3rd metatarsal LT foot  . KNEE ARTHROSCOPY Left 02/08/2015   Procedure: LEFT KNEE ARTHROSCOPY ;  Surgeon: Latanya Maudlin, MD;  Location: [facility];  Service: Orthopedics;  Laterality: Left;  . LAPAROTOMY N/A 04/02/2018   Procedure: EXPLORATORY LAPAROTOMY;  Surgeon: Isabel Caprice, MD;  Location: [facility];  Service: Gynecology;  Laterality: N/A;  . SALPINGOOPHORECTOMY Bilateral 04/02/2018   Procedure: BILATERAL SALPINGO OOPHORECTOMY;  Surgeon: Isabel Caprice, MD;  Location: [facility];  Service: Gynecology;  Laterality: Bilateral;  . TONSILLECTOMY AND ADENOIDECTOMY    . TOTAL KNEE ARTHROPLASTY Left 08/22/2015   Procedure: LEFT TOTAL  KNEE  ARTHROPLASTY;  Surgeon: Latanya Maudlin, MD;  Location: [facility];  Service: Orthopedics;  Laterality: Left;   Current Outpatient Medications on File Prior to Visit  Medication Sig Dispense Refill  . acetaminophen (TYLENOL) 500 MG tablet Take 1,000 mg by mouth every 8 (eight) hours as needed for moderate pain.     Marland Kitchen atenolol (TENORMIN) 100 MG tablet Take 1 tablet (100 mg total) by mouth daily. 90 tablet 3  .  carbidopa-levodopa (SINEMET CR) 50-200 MG tablet TAKE 1 TABLET BY MOUTH AT BEDTIME 90 tablet 1  . carbidopa-levodopa (SINEMET IR) 25-100 MG tablet TAKE 1 TABLET BY MOUTH 6 TIMES A DAY and 1-2 PRN daily (Patient taking differently: Take 1 tablet by mouth See admin instructions. Take 1 tablet by mouth six times daily) 720 tablet 1  . Carboxymethylcellul-Glycerin (LUBRICATING EYE DROPS OP) Place 1 drop into both eyes 3 (three) times daily as needed (dry eyes).    . clonazePAM (KLONOPIN) 0.5 MG tablet TAKE 1 TABLET BY MOUTH EVERY DAY AT BEDTIME 90 tablet 1  . cloNIDine (CATAPRES) 0.2 MG tablet Take 1 tablet (0.2 mg total) by mouth 2 (two) times daily. 180 tablet 0  . hydrALAZINE  (APRESOLINE) 50 MG tablet TAKE 1 TABLET BY MOUTH 4 TIMES DAILY AS NEEDED FOR ELEVATED BP AND TAKE ONE A NIGHT TIME 360 tablet 0  . mirabegron ER (MYRBETRIQ) 50 MG TB24 tablet Take 50 mg by mouth daily.    Marland Kitchen omeprazole (PRILOSEC) 20 MG capsule Take 20 mg by mouth 2 (two) times daily before a meal.    . oxybutynin (DITROPAN) 5 MG tablet Take 5 mg by mouth 2 (two) times daily.   1  . senna-docusate (SENOKOT S) 8.6-50 MG tablet Take 2 tablets by mouth at bedtime. (Patient taking differently: Take 2 tablets by mouth daily. )    . telmisartan (MICARDIS) 80 MG tablet Take 1 tablet (80 mg total) by mouth daily. An appointment needed for more refills. 90 tablet 0   No current facility-administered medications on file prior to visit.    Allergies  Allergen Reactions  . Sulfa Antibiotics     Long time ago and does not remember reaction   Family History  Problem Relation Age of Onset  . Diabetes Mother   . Kidney disease Mother   . Melanoma Mother        arm  . COPD Father   . Brain cancer Brother   . Melanoma Daughter    Social History   Socioeconomic History  . Marital status: Divorced    Spouse name: Not on file  . Number of children: 7  . Years of education: 44  . Highest education level: Not on file  Occupational History  . Occupation: ACTIVITY COORDINATOR    Employer: [employer]  Social Needs  . Financial resource strain: Not on file  . Food insecurity:    Worry: Not on file    Inability: Not on file  . Transportation needs:    Medical: Not on file    Non-medical: Not on file  Tobacco Use  . Smoking status: Former Smoker    Packs/day: 1.00    Years: 12.00    Pack years: 12.00    Last attempt to quit: 03/14/1988    Years since quitting: 30.5  . Smokeless tobacco: Never Used  Substance and Sexual Activity  . Alcohol use: No  . Drug use: No    Comment: edible ebrownies  months ago for pain  . Sexual activity: Not Currently  Lifestyle  . Physical  activity:    Days per week: Not on file    Minutes per session: Not on file  . Stress: Not on file  Relationships  . Social connections:    Talks on phone: Not on file    Gets together: Not on file    Attends religious service: Not on file    Active member of club or organization: Not  on file    Attends meetings of clubs or organizations: Not on file    Relationship status: Not on file  Other Topics Concern  . Not on file  Social History Narrative  . Not on file   Depression screen [facility] 2/9 09/28/2018 03/13/2018 08/22/2017 08/16/2017 08/04/2017  Decreased Interest 0 0 0 0 0  Down, Depressed, Hopeless 0 0 0 0 0  PHQ - 2 Score 0 0 0 0 0  Some recent data might be hidden    ROS As noted in HPI  Objective:  BP 129/73   Pulse 65   Temp 97.8 F (36.6 C) (Oral)   Resp 16   Ht 5\' 8"  (1.727 m)   Wt 235 lb (106.6 kg)   SpO2 95%   BMI 35.73 kg/m  Physical Exam  Constitutional: She is oriented to person, place, and time. She appears well-developed and well-nourished. No distress.  HENT:  Head: Normocephalic and atraumatic.  Right Ear: External ear normal.  Left Ear: External ear normal.  Mouth/Throat: Mucous membranes are dry.  Eyes: Conjunctivae are normal. No scleral icterus.  Neck: Normal range of motion. Neck supple. No thyromegaly present.  Cardiovascular: Normal rate, regular rhythm, normal heart sounds and intact distal pulses.  Pulmonary/Chest: Effort normal and breath sounds normal. No respiratory distress.  Lymphadenopathy:    She has no cervical adenopathy.  Neurological: She is alert and oriented to person, place, and time. She exhibits abnormal muscle tone. Coordination abnormal.  Skin: Skin is warm and dry. She is not diaphoretic. No erythema.  Psychiatric: She has a normal mood and affect. Her behavior is normal.     Assessment & Plan:   1. Malignant hypertension - MUCH improved control on current regimen - rarely needing hydroxyzine. cont  2. Need for  prophylactic vaccination and inoculation against influenza   3. 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 Chronic constipation - increase senokot S from 1 bid to 2 bid, try "smooth move herbal laxative tea  5. Dry mouth - try biotene mouth wash  6. Dry eye - cont with aritificial tears qid  7. Map-dot-fingerprint corneal dystrophy     Ok to refill any meds x 6 mos whenever requested  Patient will continue on current chronic medications other than changes noted above, so ok to refill when needed.   See after visit summary for patient specific instructions.  Orders Placed This Encounter  Procedures  . Flu Vaccine QUAD 36+ mos IM    Meds ordered this encounter  Medications  . antiseptic oral rinse (BIOTENE) LIQD    Sig: 15 mLs by Mouth Rinse route as needed for dry mouth.    Dispense:  1000 mL    Refill:  5  . cloNIDine (CATAPRES) 0.2 MG tablet    Sig: Take 1 tab qam and 2 tabs po qhs    Dispense:  270 tablet    Refill:  3  . telmisartan (MICARDIS) 80 MG tablet    Sig: Take 1 tablet (80 mg total) by mouth daily.    Dispense:  90 tablet    Refill:  3    Patient verbalized to me that they understand the following: diagnosis, what is being done for them, what to expect and what should be done at home.  Their questions have been answered. They understand that I am unable to predict every possible medication interaction or adverse outcome and that if any unexpected symptoms arise, they should contact us and their pharmacist,  as well as never hesitate to seek urgent/emergent care at [facility] or ER if they think it might be warranted.    [name], MD, MPH [facility] [address] [place], [place]  [address] [phone] Office phone  [phone] Office fax  09/28/18 3:43 PM

## 2018-09-28 NOTE — Patient Instructions (Addendum)
Try the mouthwash that I will send in to your pharmacy for dry mouth. Let me now if you want to try the prescription drops for dry eyes. TRY SMOOTH MOVE TEA - WITH THE OTHER TEAS AT ANY [facility] - THERE IS ONE BY THE BRAND "YOGI" AND ONE BE "TRADITIONAL MEDICINE LAXATIVE TEAS" - both seem to be equally effective in my experience.  Consider increasing your senna to 2 tab twice a day.   If you have lab work done today you will be contacted with your lab results within the next 2 weeks.  If you have not heard from [place] then please contact us. The fastest way to get your results is to register for My Chart.   IF you received an x-ray today, you will receive an invoice from [facility]. Please contact [facility] at [phone] with questions or concerns regarding your invoice.   IF you received labwork today, you will receive an invoice from [facility]. Please contact [facility] at [phone] with questions or concerns regarding your invoice.   Our billing staff will not be able to assist you with questions regarding bills from these companies.  You will be contacted with the lab results as soon as they are available. The fastest way to get your results is to activate your My Chart account. Instructions are located on the last page of this paperwork. If you have not heard from [place] regarding the results in 2 weeks, please contact this office.      Dry Eye Dry eye, also called keratoconjunctivitis sicca, is dryness of the membranes surrounding the eye. It happens when there are not enough healthy, natural tears in the eyes. The eyes must remain moist at all times. A small amount of tears is constantly produced by the tear glands (lacrimal glands). These glands are located under the outside part of the upper eyelids. Dryness of the eyes can be a symptom of a variety of conditions, such as rheumatoid arthritis, lupus, or Sjgren syndrome. Dry eye may be mild to severe. What are the  causes? This condition may be caused by:  Not making enough tears (aqueous tear-deficient dry eyes).  Tears evaporating from the eye too quickly (evaporative dry eyes). This is when there is an abnormality in the quality of your tears. This abnormality causes your tears to evaporate so quickly that the eye cannot be kept moist.  What increases the risk? This condition is more likely to happen in:  Women, especially those who have gone through menopause.  People in dry climates.  People in dusty or smoky areas.  People who take certain medicines, such as: ? Anti-allergy medicines (antihistamines). ? Blood pressure medicines (antihypertensives). ? Birth control pills (oral contraceptives). ? Laxatives. ? Tranquilizers.  What are the signs or symptoms? Symptoms in the eyes may include:  Irritation.  Itchiness.  Redness.  Burning.  Inflammation of the eyelids.  Feeling as though something is stuck in the eye.  Light sensitivity.  Increased sensitivity and discomfort when wearing contact lenses, if this applies.  Vision that varies throughout the day.  Occasional excessive tearing.  How is this diagnosed? This condition is diagnosed based on your symptoms, your medical history, and an eye exam. Your health care provider may look at your eye using a microscope and may put dyes in your eye to check the health of the surface of your eye. You may have a tests, such as a test to evaluate your tear production (Schirmer test). During this  test, a small strip of special paper is gently pressed into the inner corner of your eye. Your tear production is measured by how much of the paper is moistened by your tears during a set amount of time. You may be referred to a health care provider who specializes in eyes and eyesight (ophthalmologist). How is this treated? This condition is often treated at home. Your health care provider may recommend eye drops, which are also called  artificial tears. If your condition is severe, treatment may include:  Prescription eye drops.  Over-the-counter or prescription ointments to moisten your eyes.  Minor surgery to block tears from going into your nose.  Medicines to reduce inflammation of the eyelids.  Follow these instructions at home:  Take, use, or apply over-the-counter and prescription medicines only as told by your health care provider. This includes eye drops.  If directed, apply a warm compress to your eyes to help reduce inflammation. Place a towel over your eyes and gently press the warm compress over your eyes for about 5 minutes, or as long as told by your health care provider.  If possible, avoid dry, drafty environments.  Use a humidifier at home to increase moisture in the air.  If you wear contact lenses, remove them regularly to give your eyes a break. Always remove contacts before sleeping.  Keep all follow-up visits as told by your health care provider. This is important. This includes yearly eye exams and vision tests. Contact a health care provider if:  You have eye pain.  You have pus-like fluid coming from your eye.  Your symptoms get worse or do not improve with treatment. Get help right away if:  Your vision suddenly changes. This information is not intended to replace advice given to you by your health care provider. Make sure you discuss any questions you have with your health care provider. Document Released: 08/31/2004 Document Revised: 03/21/2016 Document Reviewed: 08/09/2015 Elsevier Interactive Patient Education  [employer].

## 2018-09-30 DIAGNOSIS — H1859 Other hereditary corneal dystrophies: Secondary | ICD-10-CM

## 2018-09-30 DIAGNOSIS — K219 Gastro-esophageal reflux disease without esophagitis: Secondary | ICD-10-CM | POA: Diagnosis not present

## 2018-09-30 DIAGNOSIS — H04129 Dry eye syndrome of unspecified lacrimal gland: Secondary | ICD-10-CM | POA: Insufficient documentation

## 2018-09-30 DIAGNOSIS — R682 Dry mouth, unspecified: Secondary | ICD-10-CM | POA: Insufficient documentation

## 2018-09-30 DIAGNOSIS — H18529 Epithelial (juvenile) corneal dystrophy, unspecified eye: Secondary | ICD-10-CM | POA: Insufficient documentation

## 2018-09-30 DIAGNOSIS — K5909 Other constipation: Secondary | ICD-10-CM | POA: Insufficient documentation

## 2018-10-07 NOTE — Progress Notes (Signed)
Jacqueline Atkinson was seen today in the movement disorders clinic for neurologic consultation at the request of [name].   The patient presents today to the movement disorder clinic at [facility] neurology for consultation regarding possible Parkinson's disease.  I had the opportunity to review records from [name], her prior neurologist.  It appears that the patient has been seeing [name] since 12/29/2013.  Patient reports that she just has not been the same ever since she had a fall in 2011.  Her R foot got caught in a stool and she fell.  She was subsequently placed under the care of orthopedic surgeons and intermittently would go to have fluid drained from her knee.   She developed a R leg drag.  She, therefore, initially attributed difficulty in walking to this injury.  She states that later on, she developed balance issues (cannot say how long that took to develop).  Over the course of time, however, she noticed decreased ability to use the right hand in order to write and began to use the left hand for more tasks.  She stated that she would have to reach across her body to use the mouse (noted that as of Christmas, 2014).  In Feb, she was at work and had a bad HA and then had a CT and subsequent MRI of the brain as they saw a meningioma and so she was referred to [name].  She saw [name] on 12/29/2013.  About a week later on 01/06/2014 she followed up with [name] who had recommended an EMG and this was done and was normal.  On 01/17/2014 [name] started her on levodopa and referred her for another opinion to [facility].  She has not yet had that appointment, but it is scheduled for 06/15/2014.  Pt states that when she first got to the pharmacy, there was a RX there for both and IR and CR tablet (she now thinks that it was accident)  She only picked up the CR and took it bid and stated that it helped her feel "looser" and more steady.  When she f/u with [name], she realized that she was supposed to be  on the IR but when she tried that, she had a lot of nausea and even emesis (tried it with carbs even but initially started with 2 po tid; then went to 1 po 6 times per day and didn't have as much emesis but still had to have the carbs with it and she didn't like all the carb intake).  Therefore, she went back to the CR version on her own and she is only on one tablet at 8am/8pm. She presents today for another opinion.  06/24/14 update:  Pt presents for f/u.  DaT scan was done yesterday.  There was significant reduction in uptake of the radiotracer in the bilateral putamen with activity confined to the caudate.  Pt remains on carbidopa/levodopa 25/100 CR at 8am/8pm.  She has been having more knee pain and went to [facility].  She was told that she needs an MRI but she decided that she didn't want to do an MRI if she wasn't going to have surgery and she didn't want to consider that.  She was given klonopin for insomnia since last visit.  She rarely takes it and said that she only took it a few times.  One of the reasons for insomnia is stiffness associated with the back pain.  Klonopin does help her  sleep when she takes it.  She had one fall since last visit; she was going up the steps and her knee buckled and fell.  She is not sure if her knee caused her to fall.  She is exercising; she is doing yoga twice per week.  She is now able to get off of the floor which she didn't used to be able to do.  She is getting ready to start the PWR exercise class.   09/26/14 update:  Pt returns today for f/u.  I was trying to transition her slowly to the IR formulation of levodopa, which she has had trouble tolerating in the past.  Last visit, I asked her to try to take carbidopa/levodopa 25/100 CR at 8am and 8pm and carbidopa/levodopa 25/100 IR at noon.  She actually moved up the dosage on her own and she is taking carbidopa/levodopa 25/100, 2 at 8am, 2 at noon, 2 at 4pm, and 2 at 8pm.  She may miss one of those dosages and may  only get in 6 pills per day.  She gets really nauseated after 10 minutes but then it gets better if she can get throught that 10 minutes.   MBE was performed on 07/01/14 and it was normal and a regular, thin liquid diet was recommended.  She did got to UC after a fall on knee on 09/13/14. She needs a knee surgery but had to wait until insurance changes.   She is exercising; she is doing yoga twice per week.  C/o a pain in the mid thoracic region that wakes her up in the middle of the night.  Lidoderm has helped but it was very expensive.  Reviewed MRI report from [facility] in 12/2013 and it was normal.  Having more urinary incontinence.  Mood is really good; feels like she is at best place that she has been in virtually all her life.  02/06/15 update:  Pt is accompanied by her sister who supplements the history.  The patient has a history of multiple system atrophy, cerebellar type.  She is on carbidopa/levodopa 25/100.  She is supposed to be on 2 tablets by mouth 4 times per day that she seems to have nausea and emesis when she takes it that way, so she is only taking 1 tablet by mouth 4 times per day.  She is also on Indocin twice a day and is hoping to get off of that after her knee surgery.  She is having knee surgery on Wednesday, which she thinks is the primary etiology for some of her gait issues.  She has been awaiting this knee surgery for a long time.  Her orthopedic surgeon did tell her that he was not sure that it was going to be able to help all of her knee issues, including her gait, however.  No falls.  She went to an ataxia conference in [place]. She asks multiple questions re: genetics.  She is planning on applying to enroll in a MSA-C MRI study involving a 7 Tesla magnet.  She will need to travel to [place] for this.  She has had some urinary incontinence, occasional.  No diplopia.  Walks with cane most of the time.  She also complains that food is getting "stuck" in the midepigastric region when  she is hungry or when she is tired.  This can consist of posterior, meat, or a salad.  She will have to walk around and stretch and sometimes will cough the entire bolus up whole.  She had a modified barium swallow in September of last year that was unremarkable.  06/13/15 update:  The patient is seen back today in follow-up in regard to her multiple system atrophy.  She is on carbidopa/levodopa 25/100, 1 tablet 4 times per day (8am/12pm/4pm/8pm).  She has had trouble tolerating increased dosages because of nausea, but we were previously unsure whether the nausea was from the levodopa or if it was from the Indocin.  Last visit, she stated that she was hopeful to discontinue the Indocin once she had her knee surgery.  She did have this on April 13 under a local block.  I reviewed those records.  She also saw GI since last visit.  I got a note from her gastroenterologist.  She had EGD which revealed evidence of gastric esophageal reflux disease, esophageal stenosis and a medium sized hiatal hernia.  She was started on Nexium, 40 mg twice a day. She states that she has another EGD on sept 10 to hopefully stretch the esophagus. .  She states that they couldn't stretch the esophagus because it was so ulcerated so placed on nexium first.  She is still having trouble with swallowing but thinks that is the esophagus issues.  She is on a soft diet.  Today, the patient states that she is having more cramping at night and more troubles with dexterity in her fingers.  Still having nausea despite off indocin; thinks from levodopa.  10/13/15 update:  The patient is following up today.  She is accompanied by her sister who supplements the history.  I have reviewed records since our last visit.  She has a history of multiple system atrophy.  She increased her carbidopa/levodopa 25/100, 1 tablet 6 times per day and she takes a CR 25/100 at night.  She has noted some rigidity on the right and some tremor on the right but only with  stress or when she is cold.  She thinks that the increase has helped.  She had an EGD in September and I got notes from that.  There were no lesions in the esophagus.  They did a dilation and she continued to have a medium sized hiatal hernia.  She had a left TKA done on 08/23/2015 and subsequently went to a subacute nursing facility for rehabilitation.  She has recovered nicely from that.  She denies any falls since our last visit.  No hallucinations.  No lightheadedness or near syncope.  She has noted a facial twitch on the right, intermittently.  She is having some dry skin.  02/13/16 update:  The patient is following up today.  She is on carbidopa/levodopa 25/100, one tablet 6 times per day (8am/10/2/4/6/8).  She takes carbidopa/levodopa 50/200 CR at bedtime (9-10 pm).  Overall, she feels that she has been stable and doing fairly well.  She denies falls.  She has not had any lightheadedness or near syncope.  No hallucinations.  Little more twitching of th right face.  Last MRI was 10/2013 and that was without gad and meningioma in the L middle cranial fossa.  Noticed that carpets that are "busy" she will lose depth perception.  Having urinary frequency.  Is on diuretic but takes it in the AM.  Asks me to put in DNR orders. States that she has discussed with her daughter and both agree.  Daughter is Marine scientist.  Is working on Systems analyst.  06/14/16 update:  The patient is following up today.  I have reviewed prior records made available  to me.  She did follow up with her primary care physician.  She remains on carbidopa/levodopa 25/100, one tablet 6 times per day in addition to carbidopa/levodopa 50/200 at bedtime.  When she is tired, her R foot will drag.  She has not had any falls since last visit but she has had some near falls when attempting to go backwards.  She knows also that she cannot navigate in the dark.  She walks better with ski poles than with the walker but she has low endurance.  She  denies any hallucinations.  She does have occasional visual distortions.  No lightheadedness or near syncope.  She still have some twitching of the right side of her face.  Mood has been good.   Urinary incontinence has gotten bad enough that she is wearing a pad.  Face has not been twitching as much  11/12/16 update:  Pt f/u today.  This patient is accompanied in the office by her daughter who supplements the history.  She was on carbidopa/levodopa 25/100, one tablet 6 times per day in addition to carbidopa/levodopa 50/200 at bedtime but she states that she got tired of taking pills so she is taking carbidopa/levodopa 50/200 in the AM and at night and carbidopa/levodopa 25/100 tid.  States that she is having "issues" but had these way before meds were changed.   Pt denies falls but near falls especially when leaning over to pick up something.  R foot wants to "stick" to the floor, independent of timing of meds and even if walker is present.   Trouble getting up from chair and bought a lift chair.  Pt denies lightheadedness, near syncope.  No hallucinations.  Mood has been good.  Having some trouble preparing meals - it is too much effort in preparing the meal.  Same is true with housekeeping.  Noting more clumsy and dropping objects but hands not numb.  Going to rock steady boxing and using gait belt there.  Wearing pad all of the time due to incontinence.    Trouble getting in and out of bed.  Trouble turning over in bed - "Im like a beached whale."    03/07/17 update:  Pt seen today in f/u.  Pt on carbidopa/levodopa 25/100 tid and also takes carbidopa/levodopa 50/200 bid. She is having lots of cramping even in her arms.   This is not necessarily how I wanted her to take medication but she thinks that it works as well this way as when she was taking more of the IR tablets.  No falls but some near falls getting up from the lift chair.  Pt denies lightheadedness, near syncope.  No hallucinations.  Mood has been  good.  The patient did go to [facility] and I reviewed notes.  She was started on Vesicare.  She states that it was great but insurance wouldn't pay for it so she was started on detrol.  It helps but not as much as vesicare.  Last visit we also ordered a hospital bed and she reports that she loves it and it has helped.  She has gained weight and plans to start new diet.  She stopped rock steady boxing because of cost  06/27/17 update:  Patient seen today in follow-up for her multiple system atrophy.  This patient is accompanied in the office by her daughter who supplements the history.  I tried to rework her medication last visit as she was having lots of cramping in her arms.  I thought  that was because she was taking the extended release during the day.  I told her to take either carbidopa/levodopa 25/100, 2 tablets 3 times per day or carbidopa/levodopa 25/100 one tablet 6 times per day in addition to her nighttime carbidopa/levodopa 50/200.  She states that she ended up with carbidopa/levodopa 25/100, 1 po 6 times per day but she is still having the cramping in the arms, and then feet at night.  Having more rigidity in the right shoulder and more dexterity issues with the right hand.  Pt denies falls.  Pt denies lightheadedness, near syncope.  No hallucinations.  Mood has been good.  She is having very vivid dreams and screams out in the middle of the night.  Feels that breathing is "laborious" and "hard to do."  But also denies SOB.  She does describe DOE - "I can talk my air out."  Is c/o swelling of feet and ankles.  Tried lasix in the past but had too much urination.  She denies any dysphagia but then tells me she occasionally will throw up food and thinks that is from her hiatal hernia.  11/07/17 update: Patient seen today in follow-up for MSA.  She has been through physical therapy since our last visit.  She can now get off of the floor which she couldn't before.  Not engaged in exercise now.  Wants  to get back in pool.  She remains on carbidopa/levodopa 25/100, 1 tablet 6 times per day and Cardopa/levodopa 50/200 at night.  Patient saw Dr. Melvyn Novas since last visit.  Pulmonary function testing was recommended.  Treatment for reflux was recommended.  She has been following up with primary care regarding elevated blood pressure.  Hydralazine was added.  Subsequently clonidine was added and the dose has been gradually increased to 0.2 mg twice per day.  Waking up with AM headache.  No bedmate.  She doesn't think that she snores.  She occasionally will wake up gasping for air.  She is on klonopin for RBD.  On one occasion she threw her alarm clock across the room.  On another occasion she kicked her dog out of the bed.  Overall, though, she thinks that it is much better.  Having "hip pain."  Saw Dr. Melvyn Novas.  Told some stenosis.  Told had uterine fibroids and Dr. Melvyn Novas thought that this could be issue.  Hasn't been to the gynecologist yet.  Has constipation.  Trying to keep up with it with diet.  Drinking lots of water.  Bladder:  Unable to tell when going.  Eye twitching getting worse and sometimes affecting vision and ability to read.  Normally sees optometry.    01/28/18 update: Patient is seen today in follow-up.  She is accompanied by her sister who supplements the history.  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  She reports that "I can read again and my eye isn't slamming shut."  She is having some twitching of the lower face near mouth and some mild droop but thinks related to twitching.    She is still on carbidopa/levodopa 25/100, 1 tablet 6 times per day and carbidopa/levodopa 50/200 at night.  Noting increasing rigidity.  R foot is dragging and sometimes cemented to floor.  She has not had falls.  She did have a nocturnal polysomnogram on December 12, 2017.  There is no evidence of sleep apnea (AHI was 2.5).  In regards to morning headache, she reports that still having them but thinks related  to  BP.  She notes that BP is very high when wakes up (180s) but then she takes her blood pressure meds and headache goes away.  She had a MBE on November 19, 2017.  This was unremarkable, with the exception of the tablet of barium lodged at the distal esophagus at one point.  Mechanical soft diet with thin liquids as recommended.  No falls.  Not exercising regularly - has called [facility] but no call back.   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  Using diapers now due to incontinence.  Sometimes its a dribble and sometimes its a lot.  Gets feeling she needs to go but will sometimes get there and already be going.  Saw Dr. Valetta Close and told she has cataracts and dry eye and using eye drops 4 times per day.  Tried to go to gyn re: uterine fibroids but couldn't find one that took insurance.  Noting some inspiratory sighs  03/12/18 update: The patient is seen today in follow-up.  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  She reports that it is getting better.  She is still on carbidopa/levodopa 25/100, 1 tablet 6 times per day and carbidopa/levodopa 50/200 at night.  At her request, she was referred to gynecology after our last visit.  She had a normal CA-125 and she had cysts on the ultrasound.  She subsequently had an MRI and this was noted.  She has a large multilobulated cystic mass on the right ovary.  She has an appointment with oncology tomorrow.  She had a urology appointment and those records are reviewed.  She saw Dr. Matilde Sprang on 03/06/18.  She was somewhat better on oxybutinin but not all the way better so myrbetriq was added to that.  She states that she doesn't think that she will be able to afford it after the samples run out.   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  She is working on "goals."  She had to miss the atypical support group the last 2 months because of other commitments.  Noting high BP in the middle of the night.  She wakes up with headache because of it.   Has appt with PCP tomorrow.  She is going to [facility] two days per week.    10/09/18 update: Patient is seen today in follow-up for multiple system atrophy.  Patient is on carbidopa/levodopa 25/100, 1 tablet 6 times per day and carbidopa/levodopa 50/200 at bedtime.  More leg cramps at night.  Also, having trouble moving in the morning.  She does get up one time at 4am to use the restroom.   She uses her walker faithfully.  She has not had falls.  She is no longer at [facility].  She is doing well with clonazepam for REM behavior disorder.  She has Botox for hemifacial spasm and that has seemed to work well.  She continues to attend the atypical parkinsonism support group, which has been beneficial for her.  Records are reviewed since last visit.  She has seen Dr. Brigitte Pulse on December 2.  Constipation was addressed.  She c/o extreme dry mouth.  States that PCP called in mouth wash but wasn't ready at pharmacy.  myrbetriq is helping the bladder.    Neuroimaging has previously been performed.  It is available for my review today.  PREVIOUS MEDICATIONS: Sinemet and Sinemet CR  ALLERGIES:   Allergies  Allergen Reactions  . Sulfa Antibiotics     Long time ago and  does not remember reaction    CURRENT MEDICATIONS:  Current Outpatient Medications on File Prior to Visit  Medication Sig Dispense Refill  . acetaminophen (TYLENOL) 500 MG tablet Take 1,000 mg by mouth every 8 (eight) hours as needed for moderate pain.     Marland Kitchen antiseptic oral rinse (BIOTENE) LIQD 15 mLs by Mouth Rinse route as needed for dry mouth. 1000 mL 5  . atenolol (TENORMIN) 100 MG tablet Take 1 tablet (100 mg total) by mouth daily. 90 tablet 3  . carbidopa-levodopa (SINEMET CR) 50-200 MG tablet TAKE 1 TABLET BY MOUTH AT BEDTIME 90 tablet 1  . carbidopa-levodopa (SINEMET IR) 25-100 MG tablet TAKE 1 TABLET BY MOUTH 6 TIMES A DAY and 1-2 PRN daily (Patient taking differently: Take 1 tablet by mouth See admin instructions. Take 1 tablet by mouth six times  daily) 720 tablet 1  . Carboxymethylcellul-Glycerin (LUBRICATING EYE DROPS OP) Place 1 drop into both eyes 3 (three) times daily as needed (dry eyes).    . clonazePAM (KLONOPIN) 0.5 MG tablet TAKE 1 TABLET BY MOUTH EVERY DAY AT BEDTIME 90 tablet 1  . cloNIDine (CATAPRES) 0.2 MG tablet Take 1 tab qam and 2 tabs po qhs 270 tablet 3  . esomeprazole (NEXIUM) 20 MG capsule Take 40 mg by mouth 2 (two) times daily.    . hydrALAZINE (APRESOLINE) 50 MG tablet TAKE 1 TABLET BY MOUTH 4 TIMES DAILY AS NEEDED FOR ELEVATED BP AND TAKE ONE A NIGHT TIME 360 tablet 0  . mirabegron ER (MYRBETRIQ) 50 MG TB24 tablet Take 50 mg by mouth daily.    Marland Kitchen oxybutynin (DITROPAN) 5 MG tablet Take 5 mg by mouth 2 (two) times daily.   1  . senna-docusate (SENOKOT S) 8.6-50 MG tablet Take 2 tablets by mouth at bedtime. (Patient taking differently: Take 2 tablets by mouth daily. )    . telmisartan (MICARDIS) 80 MG tablet Take 1 tablet (80 mg total) by mouth daily. 90 tablet 3   No current facility-administered medications on file prior to visit.     PAST MEDICAL HISTORY:   Past Medical History:  Diagnosis Date  . Anemia    hx of  . Anxiety   . Arthritis    Left knee  osteoarthritis  . GERD (gastroesophageal reflux disease)   . Heart murmur   . History of hiatal hernia    small  . Hx of seasonal allergies    occ. use OTC Mucinex or Antihistamine.  . Hyperlipidemia   . Hypertension   . Neuromuscular disorder ([place])    "multi system atrophy disease"- "pain, bilateral foot numbness, left knee pain"- Dr. Carles Collet is following.  . Ovarian cancer on left ([place]) 2019   serous borderline s/p BSO  . Pneumonia   . Shoulder disorder    right shoulder "rigidity due to Multi system atrophy"- ROM not limited"just tires me"    PAST SURGICAL HISTORY:   Past Surgical History:  Procedure Laterality Date  . ABDOMINAL HYSTERECTOMY     04-02-18 Dr. Gerarda Fraction  . CYSTOSCOPY WITH RETROGRADE PYELOGRAM, URETEROSCOPY AND STENT PLACEMENT Bilateral  05/04/2018   Procedure: CYSTOSCOPY WITH RETROGRADE PYELOGRAM, URETEROSCOPY AND STENT PLACEMENT;  Surgeon: Cleon Gustin, MD;  Location: [facility];  Service: Urology;  Laterality: Bilateral;  . DILATION AND CURETTAGE, DIAGNOSTIC / THERAPEUTIC     1981 and 1998 due to missed AB  . ESOPHAGOGASTRODUODENOSCOPY ENDOSCOPY     with esophageal dilation  . FOOT SURGERY  2011   3rd  metatarsal LT foot  . KNEE ARTHROSCOPY Left 02/08/2015   Procedure: LEFT KNEE ARTHROSCOPY ;  Surgeon: Latanya Maudlin, MD;  Location: [facility];  Service: Orthopedics;  Laterality: Left;  . LAPAROTOMY N/A 04/02/2018   Procedure: EXPLORATORY LAPAROTOMY;  Surgeon: Isabel Caprice, MD;  Location: [facility];  Service: Gynecology;  Laterality: N/A;  . SALPINGOOPHORECTOMY Bilateral 04/02/2018   Procedure: BILATERAL SALPINGO OOPHORECTOMY;  Surgeon: Isabel Caprice, MD;  Location: [facility];  Service: Gynecology;  Laterality: Bilateral;  . TONSILLECTOMY AND ADENOIDECTOMY    . TOTAL KNEE ARTHROPLASTY Left 08/22/2015   Procedure: LEFT TOTAL  KNEE  ARTHROPLASTY;  Surgeon: Latanya Maudlin, MD;  Location: [facility];  Service: Orthopedics;  Laterality: Left;    SOCIAL HISTORY:   Social History   Socioeconomic History  . Marital status: Divorced    Spouse name: Not on file  . Number of children: 7  . Years of education: 27  . Highest education level: Not on file  Occupational History  . Occupation: ACTIVITY COORDINATOR    Employer: [employer]  Social Needs  . Financial resource strain: Not on file  . Food insecurity:    Worry: Not on file    Inability: Not on file  . Transportation needs:    Medical: Not on file    Non-medical: Not on file  Tobacco Use  . Smoking status: Former Smoker    Packs/day: 1.00    Years: 12.00    Pack years: 12.00    Last attempt to quit: 03/14/1988    Years since quitting: 30.5  . Smokeless tobacco: Never Used  Substance and Sexual Activity  . Alcohol use: No  . Drug  use: No    Comment: edible ebrownies  months ago for pain  . Sexual activity: Not Currently  Lifestyle  . Physical activity:    Days per week: Not on file    Minutes per session: Not on file  . Stress: Not on file  Relationships  . Social connections:    Talks on phone: Not on file    Gets together: Not on file    Attends religious service: Not on file    Active member of club or organization: Not on file    Attends meetings of clubs or organizations: Not on file    Relationship status: Not on file  . Intimate partner violence:    Fear of current or ex partner: Not on file    Emotionally abused: Not on file    Physically abused: Not on file    Forced sexual activity: Not on file  Other Topics Concern  . Not on file  Social History Narrative  . Not on file    FAMILY HISTORY:   Family Status  Relation Name Status  . Mother  Deceased at age 12       diabetes, kidney failure, melanoma  . Father  Deceased at age 59       COPD  . Brother  Deceased       brain cancer  . Sister  Alive       healthy  . Daughter  Alive       healthy  . Daughter  Alive       healthy  . Daughter  Alive       healthy  . Daughter  Alive       healthy  . Daughter  Alive       healthy  . Son  [employer]  healthy  . Son  Alive       healthy    ROS: Review of Systems  Constitutional: Positive for malaise/fatigue.  Eyes: Negative.   Respiratory: Negative.   Cardiovascular: Negative.   Gastrointestinal: Positive for heartburn (better with nexium; hospital bed head elevated).  Genitourinary: Positive for urgency.  Musculoskeletal: Positive for back pain.  Skin: Negative.      PHYSICAL EXAMINATION:    VITALS:   Vitals:   10/09/18 1039  BP: 118/74  Pulse: 62  SpO2: 98%  Weight: 236 lb (107 kg)  Height: 5\' 8"  (1.727 m)   Wt Readings from Last 3 Encounters:  10/09/18 236 lb (107 kg)  09/28/18 235 lb (106.6 kg)  05/06/18 250 lb (113.4 kg)   GEN:  The patient appears stated age  and is in NAD. HEENT:  Normocephalic, atraumatic.  The mucous membranes are dry. The superficial temporal arteries are without ropiness or tenderness. CV:  RRR with 3/6 SEM Lungs:  ctab Neck/HEME:  There are no carotid bruits bilaterally. Skin: dry  Neurological examination:  Orientation: The patient is alert and oriented x3. Cranial nerves: There is mild R facial droop with decreased nasolabial fold on the right.  The speech is fluent and clear. Soft palate rises symmetrically and there is no tongue deviation. Hearing is intact to conversational tone. Sensation: Sensation is intact to light touch throughout Motor: Strength is at least antigravity x4.  Movement examination: Tone: There is normal tone today in the upper and lower extremity's. Abnormal movements: There is no tremor or dyskinesia today. Coordination:  There is decremation with RAM's, with any form of RAMS, including alternating supination and pronation of the forearm, hand opening and closing, finger taps, heel taps and toe taps on the right  Gait and Station: The patient ambulates with her U step walker.  She has flexed at the waist.  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      -continue carbidopa/levodopa 25/100, 1 po 6 times per day.  I told her to add an extra carbidopa/levodopa 25/100, 1 tablet at 4 AM when she wakes up to use the restroom.  She is having more cramping in the middle of the night and more trouble with first morning on.  We can also consider apokyn in the morning as well if this does not help.  -She will continue carbidopa/levodopa 50/200 at night  2.  Hemifacial spasm, right  -s/p botox which was beneficial. However, noting some R facial droop, likely from the botox.  Will not Botox but nasalis next visit.  She is happy with the results, but I would like to see the same results without the droop. 3.  Dysphagia.  -She had a MBE on November 19, 2017.  This was unremarkable, with  the exception of the tablet of barium lodged at the distal esophagus at one point.  Mechanical soft diet with thin liquids as recommended.  She is still noticing some problems, but does better if she is very careful when eating, including taking small bites and sitting straight up. 4.  Urinary incontinence  -saw urology and on oxybutynin.  Doing better on Myrbetriq, but having extreme dry mouth.  Just got a prescription for Biotene. 5.  RBD  -better with klonopin, 0.5 mg at night. 6.  I will see her back in February for Botox, and in the next 4 to 5 months for follow-up.  Much greater than 50% of this visit was  spent in counseling and coordinating care.  Total face to face time:  25 min

## 2018-10-09 ENCOUNTER — Encounter: Payer: Self-pay | Admitting: Neurology

## 2018-10-09 ENCOUNTER — Ambulatory Visit: Payer: Medicare Other | Admitting: Neurology

## 2018-10-09 VITALS — BP 118/74 | HR 62 | Ht 68.0 in | Wt 236.0 lb

## 2018-10-09 DIAGNOSIS — G5131 Clonic hemifacial spasm, right: Secondary | ICD-10-CM

## 2018-10-09 DIAGNOSIS — G4752 REM sleep behavior disorder: Secondary | ICD-10-CM | POA: Diagnosis not present

## 2018-10-09 DIAGNOSIS — G238 Other specified degenerative diseases of basal ganglia: Secondary | ICD-10-CM | POA: Diagnosis not present

## 2018-10-09 NOTE — Patient Instructions (Signed)
Add in an extra carbidopa/levodopa 25/100 IR (yellow tablet) at 4 am when you get up to use the restroom.  See if that helps the morning stiffness and the cramping.

## 2018-10-24 ENCOUNTER — Other Ambulatory Visit: Payer: Self-pay | Admitting: Neurology

## 2018-11-04 ENCOUNTER — Telehealth: Payer: Self-pay | Admitting: Family Medicine

## 2018-11-04 NOTE — Telephone Encounter (Signed)
Requested Prescriptions  Pending Prescriptions Disp Refills  . cloNIDine (CATAPRES) 0.2 MG tablet [Pharmacy Med Name: cloNIDine HCl 0.2 MG Oral Tablet] 180 tablet 0    Sig: TAKE 1 TABLET BY MOUTH TWICE DAILY     Cardiovascular:  Alpha-2 Agonists Passed - 11/04/2018  9:19 AM      Passed - Last BP in normal range    BP Readings from Last 1 Encounters:  10/09/18 118/74         Passed - Last Heart Rate in normal range    Pulse Readings from Last 1 Encounters:  10/09/18 62         Passed - Valid encounter within last 6 months    Recent Outpatient Visits          1 month ago Malignant hypertension   [facility], Laurey Arrow, MD   7 months ago Malignant hypertension   [facility], Laurey Arrow, MD   1 year ago Labile blood pressure   [facility], Laurey Arrow, MD   1 year ago Elevated blood pressure reading   [facility] at [facility], Gelene Mink, PA-C   1 year ago Labile blood pressure   [facility] at [facility], [place], MD

## 2018-11-04 NOTE — Telephone Encounter (Signed)
Spoke with Belenda Cruise, Education administrator at [facility] to confirm pt received refill  #360; Belenda Cruise confirmed that the pt received #180 tablets on 09/09/18; will refill request from 11/04/2018.

## 2018-11-10 NOTE — Telephone Encounter (Addendum)
Relation to pt: self  Call back number: [phone] Pharmacy: [place], [place] [phone] (Phone) [phone] (Fax)     Reason for call:  Patient states cloNIDine (CATAPRES) 0.2 MG tablet frequency was increased to 1 in the am in 2 in the pm, therefore patient is out and would like new script reflecting the frequency change, please advise

## 2018-11-10 NOTE — Telephone Encounter (Signed)
Please advise 

## 2018-11-12 DIAGNOSIS — N131 Hydronephrosis with ureteral stricture, not elsewhere classified: Secondary | ICD-10-CM | POA: Diagnosis not present

## 2018-11-19 ENCOUNTER — Telehealth: Payer: Self-pay | Admitting: Neurology

## 2018-11-19 NOTE — Telephone Encounter (Signed)
Received vm that patient approved for Botox valid one year from 11/16/2018. They will mail approval letter. Contact number [phone] opt 5.

## 2018-12-02 ENCOUNTER — Other Ambulatory Visit: Payer: Self-pay | Admitting: Neurology

## 2018-12-02 MED ORDER — CLONAZEPAM 0.5 MG PO TABS: 0.5000 mg | ORAL_TABLET | Freq: Every day | ORAL | 1 refills | Status: DC

## 2018-12-02 MED ORDER — CARBIDOPA-LEVODOPA 25-100 MG PO TABS: ORAL_TABLET | ORAL | 1 refills | Status: DC

## 2018-12-02 MED ORDER — CARBIDOPA-LEVODOPA ER 50-200 MG PO TBCR: 1.0000 | EXTENDED_RELEASE_TABLET | Freq: Every day | ORAL | 1 refills | Status: DC

## 2018-12-02 NOTE — Telephone Encounter (Signed)
Received note that [facility]. Please sign prescriptions.

## 2018-12-04 ENCOUNTER — Other Ambulatory Visit: Payer: Self-pay | Admitting: Neurology

## 2018-12-17 ENCOUNTER — Other Ambulatory Visit: Payer: Self-pay | Admitting: Family Medicine

## 2018-12-25 ENCOUNTER — Ambulatory Visit (INDEPENDENT_AMBULATORY_CARE_PROVIDER_SITE_OTHER): Payer: Medicare Other | Admitting: Neurology

## 2018-12-25 DIAGNOSIS — G5139 Clonic hemifacial spasm, unspecified: Secondary | ICD-10-CM

## 2018-12-25 MED ORDER — ONABOTULINUMTOXINA 100 UNITS IJ SOLR
12.0000 [IU] | Freq: Once | INTRAMUSCULAR | Status: AC
  Administered 2018-12-25: 12 [IU] via INTRAMUSCULAR

## 2018-12-25 NOTE — Procedures (Signed)
[facility]   History:  Diagnosis: Hemifacial spasm   Initial side: right   Result History  Pt reports that it helped.   Consent obtained from: The patient Benefits discussed included, but were not limited to decreased muscle tightness, increased joint range of motion, and decreased pain.  Risk discussed included, but were not limited pain and discomfort, bleeding, bruising, excessive weakness, venous thrombosis, muscle atrophy and dysphagia.  A copy of the patient medication guide was given to the patient which explains the blackbox warning.  Patients identity and treatment sites confirmed Yes.  .  Details of Procedure: Skin was cleaned with alcohol.  A 30 gauge, 1/2 inch needle was introduced to target mm.  Prior to injection, the needle plunger was aspirated to make sure the needle was not within a blood vessel.  There was no blood retrieved on aspiration.    Following is a summary of the muscles injected  And the amount of Botulinum toxin used:  Injections  Location Left  Right Units Number of sites        Corrugator      Frontalis      Lower Lid, Lateral  2.5 2.5   Lower Lid Medial      Upper Lid, Lateral  2.5 2.5   Upper Lid, Medial      Canthus  5.0 5.0   nasalis  2.5 2.5   Masseter      Procerus      Zygomaticus Major      TOTAL UNITS:   12.5    Agent: Botulinum Type A ( Onobotulinum Toxin type A ).  1 vials of Botox were used, each containing 50 units and freshly diluted with 2 mL of sterile, non-perserved saline   Total injected (Units): 12.5  Total wasted (Units): 0 Pt tolerated procedure well without complications.   Reinjection is anticipated in 3 months.

## 2019-01-01 ENCOUNTER — Telehealth: Payer: Self-pay | Admitting: Neurology

## 2019-01-01 DIAGNOSIS — G238 Other specified degenerative diseases of basal ganglia: Secondary | ICD-10-CM

## 2019-01-01 NOTE — Telephone Encounter (Signed)
Ok but not sure that anyone will pay for it

## 2019-01-01 NOTE — Telephone Encounter (Signed)
Is this something we can order??

## 2019-01-01 NOTE — Telephone Encounter (Signed)
Patient called needing to see if Dr. Carles Collet would send a Prescription to [facility] for a Wheelchair Ramp? The fax number is [phone]. Patient asked to please call her to let her know that it has been sent. Thanks

## 2019-01-04 MED ORDER — AMBULATORY NON FORMULARY MEDICATION: 0 refills | Status: DC

## 2019-01-04 NOTE — Telephone Encounter (Signed)
Patient made aware Dr. Carles Collet wrote for ramp. This has been faxed.

## 2019-01-20 ENCOUNTER — Telehealth: Payer: Self-pay | Admitting: Family Medicine

## 2019-01-20 MED ORDER — CLONIDINE HCL 0.2 MG PO TABS: ORAL_TABLET | ORAL | 1 refills | Status: DC

## 2019-01-20 NOTE — Telephone Encounter (Signed)
Copied from [place] [id number]. Topic: Quick Communication - Rx Refill/Question >> Jan 20, 2019 12:00 PM Andria Frames L wrote: Medication: cloNIDine (CATAPRES) 0.2 MG tablet [[id number]] pt would like a call back from the nurse regarding medication. Pt states that Dr Brigitte Pulse told her to take 2 at night and the RX does not reflect this. Pt states that he BP is 118/95. Please advise

## 2019-01-20 NOTE — Addendum Note (Signed)
Addended by: Delia Chimes A on: 01/20/2019 02:11 PM   Modules accepted: Orders

## 2019-01-20 NOTE — Telephone Encounter (Signed)
Spoke to patient will speak to Dr. Nolon Rod to have prescription written correctly Per Dr. Brigitte Pulse not patient is to be taking in once am 2 night

## 2019-01-20 NOTE — Telephone Encounter (Signed)
Clarification of the medication clonidine 1 tab qAM and 2 tabs qQHS

## 2019-01-22 ENCOUNTER — Encounter: Payer: Self-pay | Admitting: Neurology

## 2019-01-22 NOTE — Progress Notes (Signed)
Botox approval valid through 10/29/2019. Patient to use buy and bill.

## 2019-01-25 ENCOUNTER — Telehealth: Payer: Self-pay | Admitting: *Deleted

## 2019-01-25 ENCOUNTER — Ambulatory Visit (INDEPENDENT_AMBULATORY_CARE_PROVIDER_SITE_OTHER): Payer: Medicare Other | Admitting: Family Medicine

## 2019-01-25 ENCOUNTER — Other Ambulatory Visit: Payer: Self-pay

## 2019-01-25 VITALS — Ht 68.0 in | Wt 240.0 lb

## 2019-01-25 DIAGNOSIS — Z0000 Encounter for general adult medical examination without abnormal findings: Secondary | ICD-10-CM | POA: Diagnosis not present

## 2019-01-25 NOTE — Telephone Encounter (Signed)
Can we schedule a transfer of care visit to Dr. Nolon Rod or Pamella Pert, she was a patient of Dr. Brigitte Pulse she has a hx of numbness in her legs but feels it is getting worse.

## 2019-01-25 NOTE — Progress Notes (Signed)
Will set up appointment to establish care with new provider.  Patient states feet are numb and heavy almost all day. Sometimes goes up leg.  Numbness and tingling since last time was here  Patient uses a walker. States most of her small falls is when she has to step backwards.   Is aware furniture is set up so she doesn't have to go backwards.   Feeling very unsteady on feet difficulty getting up from a sitting position.    Lives in 1 story home only 3 stairs in out of house lives with 2 daughters and son in [employer]. One Daughter is an Therapist, sports one is a CMA Patient has a button that she can press if she falls, and also has ALEXA in rooms so if she fall she can ask for help.   Free of scattered rugs well lit Grab bars in the bathrooms walk in shower with bench with shower stool.   Patient states she does all personal care, sometimes states she has to call daughter to help get her clothes on.  Her daughters help with all cooking, cleaning.   Has membership at gym Does a parkinson dance group from chairs and swim. Unable to do now with lock-down .  Breakfast:  A lot of time no breakfast Lunch:  premade meals (pasta vegetables meat Dinner:  Same as lunch Ribs mashed potatoes.      Presents today for Medicare Annual Wellness Visit   Patient Care Team: Shawnee Knapp, MD as PCP - General (Family Medicine) Consuella Lose, MD as Consulting Physician (Neurosurgery) Latanya Maudlin, MD as Consulting Physician (Orthopedic Surgery)    ADVANCE DIRECTIVES: Discussed: Yes On File: no   Immunization status:  Immunization History  Administered Date(s) Administered  . Influenza Split 08/07/2013  . Influenza,inj,Quad PF,6+ Mos 08/16/2017  . Tdap 11/27/2012     Health Maintenance Due  Topic Date Due  . Hepatitis C Screening  [birth year]  . COLONOSCOPY  01/31/2006  . MAMMOGRAM  06/30/2011  . INFLUENZA VACCINE  05/28/2018     Functional Status Survey:     6CIT Screen 01/25/2019   What Year? 0 points  What month? 0 points  What time? 0 points  Count back from 20 0 points  Months in reverse 0 points  Repeat phrase 0 points  Total Score 0        Clinical Support from 01/25/2019 in Primary Care at [facility]  AUDIT-C Score  0          Patient Active Problem List   Diagnosis Date Noted  . Chronic constipation 09/30/2018  . Dry mouth 09/30/2018  . 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 Dry eye 09/30/2018  . Hydronephrosis 05/04/2018  . Flank pain 05/04/2018  . Intra-abdominal and pelvic swelling, mass and lump, unspecified site 04/02/2018  . Pelvic mass 03/14/2018  . Dyspnea on exertion 08/05/2017  . Hyperlipidemia 04/23/2016  . Arthritis 04/23/2016  . 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 Postoperative anemia due to acute blood loss 08/29/2015  . 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 Gait difficulty 12/29/2013  . Back pain 12/29/2013  . Paresthesia 12/19/2012  . Essential hypertension 11/28/2012  . Polypharmacy 11/28/2012  . GERD (gastroesophageal reflux disease) 11/28/2012     Past Medical History:  Diagnosis Date  . Anemia    hx of  . Anxiety   . Arthritis    Left knee  osteoarthritis  . GERD (gastroesophageal reflux disease)   . Heart murmur   .  History of hiatal hernia    small  . Hx of seasonal allergies    occ. use OTC Mucinex or Antihistamine.  . Hyperlipidemia   . Hypertension   . Neuromuscular disorder ([place])    "multi system atrophy disease"- "pain, bilateral foot numbness, left knee pain"- Dr. Carles Collet is following.  . Ovarian cancer on left ([place]) 2019   serous borderline s/p BSO  . Pneumonia   . Shoulder disorder    right shoulder "rigidity due to Multi system atrophy"- ROM not limited"just tires me"     Past Surgical History:  Procedure Laterality Date  . ABDOMINAL HYSTERECTOMY     04-02-18 Dr. Gerarda Fraction  . CYSTOSCOPY WITH RETROGRADE PYELOGRAM, URETEROSCOPY AND STENT PLACEMENT Bilateral 05/04/2018    Procedure: CYSTOSCOPY WITH RETROGRADE PYELOGRAM, URETEROSCOPY AND STENT PLACEMENT;  Surgeon: Cleon Gustin, MD;  Location[facility];  Service: Urology;  Laterality: Bilateral;  . DILATION AND CURETTAGE, DIAGNOSTIC / THERAPEUTIC     1981 and 1998 due to missed AB  . ESOPHAGOGASTRODUODENOSCOPY ENDOSCOPY     with esophageal dilation  . FOOT SURGERY  2011   3rd metatarsal LT foot  . KNEE ARTHROSCOPY Left 02/08/2015   Procedure: LEFT KNEE ARTHROSCOPY ;  Surgeon: Latanya Maudlin, MD;  Location: [facility];  Service: Orthopedics;  Laterality: Left;  . LAPAROTOMY N/A 04/02/2018   Procedure: EXPLORATORY LAPAROTOMY;  Surgeon: Isabel Caprice, MD;  Location: [facility];  Service: Gynecology;  Laterality: N/A;  . SALPINGOOPHORECTOMY Bilateral 04/02/2018   Procedure: BILATERAL SALPINGO OOPHORECTOMY;  Surgeon: Isabel Caprice, MD;  Location: [facility];  Service: Gynecology;  Laterality: Bilateral;  . TONSILLECTOMY AND ADENOIDECTOMY    . TOTAL KNEE ARTHROPLASTY Left 08/22/2015   Procedure: LEFT TOTAL  KNEE  ARTHROPLASTY;  Surgeon: Latanya Maudlin, MD;  Location: [facility];  Service: Orthopedics;  Laterality: Left;     Family History  Problem Relation Age of Onset  . Diabetes Mother   . Kidney disease Mother   . Melanoma Mother        arm  . COPD Father   . Brain cancer Brother   . Melanoma Daughter      Social History   Socioeconomic History  . Marital status: Divorced    Spouse name: Not on file  . Number of children: 7  . Years of education: 9  . Highest education level: Not on file  Occupational History  . Occupation: ACTIVITY COORDINATOR    Employer: [employer]  Social Needs  . Financial resource strain: Not on file  . Food insecurity:    Worry: Not on file    Inability: Not on file  . Transportation needs:    Medical: Not on file    Non-medical: Not on file  Tobacco Use  . Smoking status: Former Smoker    Packs/day: 1.00    Years: 12.00    Pack  years: 12.00    Last attempt to quit: 03/14/1988    Years since quitting: 30.8  . Smokeless tobacco: Never Used  Substance and Sexual Activity  . Alcohol use: No  . Drug use: No    Comment: edible ebrownies  months ago for pain  . Sexual activity: Not Currently  Lifestyle  . Physical activity:    Days per week: Not on file    Minutes per session: Not on file  . Stress: Not on file  Relationships  . Social connections:    Talks on phone: Not on file  Gets together: Not on file    Attends religious service: Not on file    Active member of club or organization: Not on file    Attends meetings of clubs or organizations: Not on file    Relationship status: Not on file  . Intimate partner violence:    Fear of current or ex partner: Not on file    Emotionally abused: Not on file    Physically abused: Not on file    Forced sexual activity: Not on file  Other Topics Concern  . Not on file  Social History Narrative  . Not on file     Allergies  Allergen Reactions  . Sulfa Antibiotics     Long time ago and does not remember reaction     Prior to Admission medications   Medication Sig Start Date End Date Taking? Authorizing Provider  acetaminophen (TYLENOL) 500 MG tablet Take 1,000 mg by mouth every 8 (eight) hours as needed for moderate pain.    Yes [provider]  AMBULATORY NON FORMULARY MEDICATION Wheelchair Ramp  DX: G23.8 01/04/19  Yes Tat, Eustace Quail, DO  atenolol (TENORMIN) 100 MG tablet Take 1 tablet (100 mg total) by mouth daily. 03/13/18  Yes Shawnee Knapp, MD  carbidopa-levodopa (SINEMET CR) 50-200 MG tablet Take 1 tablet by mouth at bedtime. 12/02/18  Yes Tat, Eustace Quail, DO  carbidopa-levodopa (SINEMET IR) 25-100 MG tablet TAKE 1 TABLET BY MOUTH 6 TIMES A DAY Take 1 extra daily PRN 12/02/18  Yes Tat, Eustace Quail, DO  Carboxymethylcellul-Glycerin (LUBRICATING EYE DROPS OP) Place 1 drop into both eyes 3 (three) times daily as needed (dry eyes).   Yes [provider]  clonazePAM (KLONOPIN) 0.5 MG tablet Take 1 tablet (0.5 mg total) by mouth at bedtime. 12/02/18  Yes Tat, Eustace Quail, DO  cloNIDine (CATAPRES) 0.2 MG tablet Take one tablet by mouth every morning and 2 tablets by mouth at bedtime. 01/20/19  Yes Delia Chimes A, MD  esomeprazole (NEXIUM) 20 MG capsule Take 40 mg by mouth 2 (two) times daily.   Yes [provider]  hydrALAZINE (APRESOLINE) 50 MG tablet TAKE 1 TABLET BY MOUTH 4 TIMES DAILY AS NEEDED FOR ELEVATED BP AND TAKE ONE A NIGHT TIME 09/03/18  Yes Shawnee Knapp, MD  mirabegron ER (MYRBETRIQ) 50 MG TB24 tablet Take 50 mg by mouth daily.   Yes [provider]  oxybutynin (DITROPAN) 5 MG tablet Take 5 mg by mouth 2 (two) times daily.  01/26/18  Yes [provider]  senna-docusate (SENOKOT S) 8.6-50 MG tablet Take 2 tablets by mouth at bedtime. Patient taking differently: Take 2 tablets by mouth daily.  03/31/18  Yes Shawnee Knapp, MD  telmisartan (MICARDIS) 80 MG tablet Take 1 tablet (80 mg total) by mouth daily. 09/28/18  Yes Shawnee Knapp, MD  antiseptic oral rinse (BIOTENE) LIQD 15 mLs by Mouth Rinse route as needed for dry mouth. Patient not taking: Reported on 01/25/2019 09/28/18   Shawnee Knapp, MD     Depression screen [facility] 2/9 01/25/2019 09/28/2018 03/13/2018 08/22/2017 08/16/2017  Decreased Interest 0 0 0 0 0  Down, Depressed, Hopeless 0 0 0 0 0  PHQ - 2 Score 0 0 0 0 0  Some recent data might be hidden     Fall Risk  01/25/2019 10/09/2018 09/28/2018 03/13/2018 03/12/2018  Falls in the past year? 1 0 1 No No  Comment - - - - -  Number falls in  past yr: 1 - 1 - -  Injury with Fall? 0 - 0 - -  Risk Factor Category  - - - - -  Risk for fall due to : History of fall(s) - - - -  Follow up Falls evaluation completed;Education provided;Falls prevention discussed Falls evaluation completed - - -      PHYSICAL EXAM: Ht 5\' 8"  (1.727 m)   Wt 240 lb (108.9 kg)   BMI 36.49 kg/m    Wt Readings from Last 3  Encounters:  01/25/19 240 lb (108.9 kg)  10/09/18 236 lb (107 kg)  09/28/18 235 lb (106.6 kg)

## 2019-01-25 NOTE — Progress Notes (Signed)
I was was available for the history and physical exam. I personally discussed this patient at the time of the office visit and agree with the assessment and plan.

## 2019-01-27 ENCOUNTER — Telehealth: Payer: Self-pay | Admitting: Family Medicine

## 2019-01-27 NOTE — Telephone Encounter (Signed)
Called in RX 

## 2019-01-27 NOTE — Telephone Encounter (Signed)
[facility] calling to request a new prescription of Clonidine 0.2mg . Prescription currently on file with that pharmacy states for the pt to take 1 tab by mouth twice daily. Pt states she is currently taking 1 tab in the morning and 2 tabs in the evening. [facility] states new prescription can be faxed to [phone] or can be called in to [phone] option #3.

## 2019-01-29 ENCOUNTER — Telehealth: Payer: Self-pay | Admitting: Family Medicine

## 2019-01-29 NOTE — Telephone Encounter (Signed)
Pt is on 50mg  but should be out of medicine due to it being prescribed in Nov. 2019. So telemed

## 2019-01-29 NOTE — Telephone Encounter (Signed)
[facility] left a VM for this pt regarding the dosing for the Apresoline.  Is it 25 mg or 50 mg? Was a pt of Dr. Babs Sciara. Number is [phone]  Push 3 Fax # :  [phone].

## 2019-02-06 ENCOUNTER — Other Ambulatory Visit: Payer: Self-pay | Admitting: Family Medicine

## 2019-02-06 NOTE — Telephone Encounter (Signed)
Last filled by Dr. Brigitte Pulse. No pending ovs

## 2019-02-12 ENCOUNTER — Ambulatory Visit: Payer: Medicare Other | Admitting: Psychology

## 2019-02-23 ENCOUNTER — Encounter: Payer: Self-pay | Admitting: Neurology

## 2019-02-26 DIAGNOSIS — R252 Cramp and spasm: Secondary | ICD-10-CM | POA: Diagnosis not present

## 2019-02-26 DIAGNOSIS — M549 Dorsalgia, unspecified: Secondary | ICD-10-CM | POA: Diagnosis not present

## 2019-03-02 NOTE — Progress Notes (Signed)
Virtual Visit via Video Note The purpose of this virtual visit is to provide medical care while limiting exposure to the novel coronavirus.    Consent was obtained for video visit:  Yes.   Answered questions that patient had about telehealth interaction:  Yes.   I discussed the limitations, risks, security and privacy concerns of performing an evaluation and management service by telemedicine. I also discussed with the patient that there may be a patient responsible charge related to this service. The patient expressed understanding and agreed to proceed.  Pt location: Home Physician Location: home Name of referring provider:  Shawnee Knapp, MD I connected with Beverlee Nims Mazzola at patients initiation/request on 03/04/2019 at 11:00 AM EDT by video enabled telemedicine application and verified that I am speaking with the correct person using two identifiers. 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  She is on carbidopa/levodopa 25/100, 1 tablet 6 times during the day and last visit we added an extra 4 AM when she wakes up to use the restroom because she was having cramping in the middle of the night despite the carbidopa/levodopa 50/200 at bedtime.  She reports that she only takes that if she wakes up and it works if she wakes up but it takes a while to kick in. She describes some feeling of inner tremor and occasional will see a R arm tremor.   Her last Botox for hemifacial spasm was on February 28.  She states that is working well but mouth will pull a little when tired (upper face is good).  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  She states that she is losing sensitivity in the feet and they feel like they are in concrete. Sometimes, with walking she will physicially freeze if she stops too long.  Has had several falls backwards.  Daughter thinks patient is depressed and  pt agrees that "it doesn't take much to set me off."  She went to the UC on Friday because of a stabbing pain in her shoulder and she couldn't even breathe deep without pain.  She was given a shot of toradol and then given baclofen.  The baclofen made her sleepy.  She is getting better.  Bladder doing well with myrbetriq.     Observations/Objective:   Vitals:   03/04/19 0912  BP: 137/88  Pulse: 86  Weight: 229 lb (103.9 kg)  Height: 5\' 8"  (1.727 m)    Current Outpatient Medications on File Prior to Visit  Medication Sig Dispense Refill   acetaminophen (TYLENOL) 500 MG tablet Take 1,000 mg by mouth every 8 (eight) hours as needed for moderate pain.      AMBULATORY NON FORMULARY MEDICATION Wheelchair Ramp  DX: G23.8 1 Device 0   atenolol (TENORMIN) 100 MG tablet Take 1 tablet by mouth once daily. 30 tablet 0   baclofen (LIORESAL) 10 MG tablet Take 10 mg by mouth. Take 1 tab every 8 hr     carbidopa-levodopa (SINEMET CR) 50-200 MG tablet Take 1 tablet by mouth at bedtime. 90 tablet 1   carbidopa-levodopa (SINEMET IR) 25-100 MG tablet TAKE 1 TABLET BY MOUTH 6 TIMES A DAY Take 1 extra daily PRN 630 tablet 1   Carboxymethylcellul-Glycerin (LUBRICATING EYE DROPS OP) Place 1 drop into both eyes 3 (three) times daily as needed (dry eyes).  clonazePAM (KLONOPIN) 0.5 MG tablet Take 1 tablet (0.5 mg total) by mouth at bedtime. 90 tablet 1   cloNIDine (CATAPRES) 0.2 MG tablet Take one tablet by mouth every morning and 2 tablets by mouth at bedtime. 270 tablet 1   esomeprazole (NEXIUM) 20 MG capsule Take 40 mg by mouth 2 (two) times daily. 2 tab in morning and 2 tab at night     hydrALAZINE (APRESOLINE) 50 MG tablet TAKE 1 TABLET BY MOUTH 4 TIMES DAILY AS NEEDED FOR ELEVATED BP AND TAKE ONE A NIGHT TIME 360 tablet 0   mirabegron ER (MYRBETRIQ) 50 MG TB24 tablet Take 50 mg by mouth daily.     oxybutynin (DITROPAN) 5 MG tablet Take 5 mg by mouth 2 (two) times daily.   1   senna-docusate  (SENOKOT S) 8.6-50 MG tablet Take 2 tablets by mouth at bedtime. (Patient taking differently: Take 2 tablets by mouth daily. 2 tab in morning and 2 at night)     telmisartan (MICARDIS) 80 MG tablet Take 1 tablet (80 mg total) by mouth daily. 90 tablet 3   No current facility-administered medications on file prior to visit.     GEN:  The patient appears stated age and is in NAD.  Neurological examination:  Orientation: The patient is alert and oriented x3. Cranial nerves: There is good facial symmetry. There is no facial hypomimia.  The speech is fluent and clear. Soft palate rises symmetrically and there is no tongue deviation. Hearing is intact to conversational tone. Motor: Strength is at least antigravity x 4.   Shoulder shrug is equal and symmetric.  There is no pronator drift.  Movement examination: Tone: unable Abnormal movements: None Gait and Station: Not tested    Assessment and Plan:   1.  MSA C             -DaT scan was abnormal on 06/23/14.                 -continue carbidopa/levodopa 25/100, 1 po 6 times per day and an additional tablet at 4 AM when and if she wakes up to use the restroom.                 -She will continue carbidopa/levodopa 50/200 at night   -Patient was reluctant, but agreeable to home LSVT BIG.  She has had multiple falls in the home.  Currently homebound because of the pandemic (generally does not go out much anyway).  -Patient has been participating in online support groups that are out of town, as ours has not gotten back started in this format.  Discussed that we will be starting the same.  We will have our social worker reach out to her. 2.  Hemifacial spasm, right             -s/p botox which was beneficial.    3.  Dysphagia.             -She had a MBE on November 19, 2017.  This was unremarkable, with the exception of the tablet of barium lodged at the distal esophagus at one point.  Mechanical soft diet with thin liquids as recommended.  She is  still noticing some problems, but does better if she is very careful when eating, including taking small bites and sitting straight up. 4.  Urinary incontinence             -saw urology and on oxybutynin.  Doing better on Myrbetriq, but having  extreme dry mouth.  Just got a prescription for Biotene. 5.  RBD             -better with klonopin, 0.5 mg at night. 6.  Depression, mild  -Start Cymbalta, 30 mg daily.  Discussed risk, benefits, side effects.  Patient agreeable. 7.  Paresthesias in feet  -Hopeful that the Cymbalta may help this as well, although I think that some of what she is describing and feet is not any paresthesias, but heaviness in the feet which is just going to be common with MSA.  Follow Up Instructions:    -I discussed the assessment and treatment plan with the patient. The patient was provided an opportunity to ask questions and all were answered. The patient agreed with the plan and demonstrated an understanding of the instructions.   The patient was advised to call back or seek an in-person evaluation if the symptoms worsen or if the condition fails to improve as anticipated.    Total Time spent in visit with the patient was:  25 min, of which more than 50% of the time was spent in counseling and/or coordinating care on safety and counseling.   Pt understands and agrees with the plan of care outlined.     Alonza Bogus, DO

## 2019-03-04 ENCOUNTER — Telehealth (INDEPENDENT_AMBULATORY_CARE_PROVIDER_SITE_OTHER): Payer: Medicare Other | Admitting: Neurology

## 2019-03-04 ENCOUNTER — Other Ambulatory Visit: Payer: Self-pay

## 2019-03-04 ENCOUNTER — Encounter: Payer: Self-pay | Admitting: Neurology

## 2019-03-04 DIAGNOSIS — R202 Paresthesia of skin: Secondary | ICD-10-CM | POA: Diagnosis not present

## 2019-03-04 DIAGNOSIS — G238 Other specified degenerative diseases of basal ganglia: Secondary | ICD-10-CM | POA: Diagnosis not present

## 2019-03-04 DIAGNOSIS — F330 Major depressive disorder, recurrent, mild: Secondary | ICD-10-CM | POA: Diagnosis not present

## 2019-03-04 MED ORDER — DULOXETINE HCL 30 MG PO CPEP: 30.0000 mg | ORAL_CAPSULE | Freq: Every day | ORAL | 5 refills | Status: DC

## 2019-03-08 ENCOUNTER — Telehealth: Payer: Self-pay | Admitting: Family Medicine

## 2019-03-08 NOTE — Telephone Encounter (Signed)
Requested medication (s) are due for refill today -yes  Requested medication (s) are on the active medication list -yes  Future visit scheduled -no  Last refill: 02/08/19  Notes to clinic: Patient was seen by Dr Bridget Hartshorn for annual wellness visit and changing providers was discussed- patient is up to date with visits- but needs to decide if she is continuing care with practice.Sent for review of refill request.  Requested Prescriptions  Pending Prescriptions Disp Refills   atenolol (TENORMIN) 100 MG tablet [Pharmacy Med Name: Atenolol 100mg  Tablet] 30 tablet 0    Sig: Take 1 tablet by mouth once daily.     Cardiovascular:  Beta Blockers Passed - 03/08/2019  7:36 AM      Passed - Last BP in normal range    BP Readings from Last 1 Encounters:  03/04/19 137/88         Passed - Last Heart Rate in normal range    Pulse Readings from Last 1 Encounters:  03/04/19 86         Passed - Valid encounter within last 6 months    Recent Outpatient Visits          1 month ago Medicare annual wellness visit, subsequent   Primary Care at [facility], Arlie Solomons, MD   5 months ago Malignant hypertension   [facility] at [facility], Laurey Arrow, MD   12 months ago Malignant hypertension   [facility] at [facility], Laurey Arrow, MD   1 year ago Labile blood pressure   [facility] at [facility], Laurey Arrow, MD   1 year ago Elevated blood pressure reading   Primary Care at [facility], Gelene Mink, [place]              Requested Prescriptions  Pending Prescriptions Disp Refills   atenolol (TENORMIN) 100 MG tablet [Pharmacy Med Name: Atenolol 100mg  Tablet] 30 tablet 0    Sig: Take 1 tablet by mouth once daily.     Cardiovascular:  Beta Blockers Passed - 03/08/2019  7:36 AM      Passed - Last BP in normal range    BP Readings from Last 1 Encounters:  03/04/19 137/88         Passed - Last Heart Rate in normal range    Pulse Readings from Last 1 Encounters:  03/04/19 86         Passed -  Valid encounter within last 6 months    Recent Outpatient Visits          1 month ago Medicare annual wellness visit, subsequent   Primary Care at [facility], Arlie Solomons, MD   5 months ago Malignant hypertension   [facility] at [facility], Laurey Arrow, MD   12 months ago Malignant hypertension   [facility] at [facility], Laurey Arrow, MD   1 year ago Labile blood pressure   [facility] Care at [facility], Laurey Arrow, MD   1 year ago Elevated blood pressure reading   Primary Care at [facility], Gelene Mink, [place]

## 2019-03-09 NOTE — Telephone Encounter (Signed)
Pt stated she plans to continue care at [facility] and needs to know if she needs to do a virtual appt to get refill for atenolol. Please advise pt. OE#[phone]

## 2019-03-10 ENCOUNTER — Ambulatory Visit: Payer: Medicare Other | Admitting: Neurology

## 2019-03-11 ENCOUNTER — Other Ambulatory Visit: Payer: Self-pay | Admitting: *Deleted

## 2019-03-11 DIAGNOSIS — G238 Other specified degenerative diseases of basal ganglia: Secondary | ICD-10-CM

## 2019-03-11 DIAGNOSIS — G20 Parkinson's disease: Secondary | ICD-10-CM

## 2019-03-11 DIAGNOSIS — R296 Repeated falls: Secondary | ICD-10-CM

## 2019-03-12 NOTE — Telephone Encounter (Signed)
Yes patient needs to est care with a provider so needs OV

## 2019-03-13 DIAGNOSIS — I10 Essential (primary) hypertension: Secondary | ICD-10-CM | POA: Diagnosis not present

## 2019-03-13 DIAGNOSIS — G5131 Clonic hemifacial spasm, right: Secondary | ICD-10-CM | POA: Diagnosis not present

## 2019-03-13 DIAGNOSIS — M1712 Unilateral primary osteoarthritis, left knee: Secondary | ICD-10-CM | POA: Diagnosis not present

## 2019-03-13 DIAGNOSIS — R1310 Dysphagia, unspecified: Secondary | ICD-10-CM | POA: Diagnosis not present

## 2019-03-13 DIAGNOSIS — M549 Dorsalgia, unspecified: Secondary | ICD-10-CM | POA: Diagnosis not present

## 2019-03-13 DIAGNOSIS — F330 Major depressive disorder, recurrent, mild: Secondary | ICD-10-CM | POA: Diagnosis not present

## 2019-03-13 DIAGNOSIS — G238 Other specified degenerative diseases of basal ganglia: Secondary | ICD-10-CM | POA: Diagnosis not present

## 2019-03-13 DIAGNOSIS — R202 Paresthesia of skin: Secondary | ICD-10-CM | POA: Diagnosis not present

## 2019-03-13 DIAGNOSIS — K219 Gastro-esophageal reflux disease without esophagitis: Secondary | ICD-10-CM | POA: Diagnosis not present

## 2019-03-13 DIAGNOSIS — H1859 Other hereditary corneal dystrophies: Secondary | ICD-10-CM | POA: Diagnosis not present

## 2019-03-17 DIAGNOSIS — F330 Major depressive disorder, recurrent, mild: Secondary | ICD-10-CM | POA: Diagnosis not present

## 2019-03-17 DIAGNOSIS — M1712 Unilateral primary osteoarthritis, left knee: Secondary | ICD-10-CM | POA: Diagnosis not present

## 2019-03-17 DIAGNOSIS — I10 Essential (primary) hypertension: Secondary | ICD-10-CM | POA: Diagnosis not present

## 2019-03-17 DIAGNOSIS — G5131 Clonic hemifacial spasm, right: Secondary | ICD-10-CM | POA: Diagnosis not present

## 2019-03-17 DIAGNOSIS — R202 Paresthesia of skin: Secondary | ICD-10-CM | POA: Diagnosis not present

## 2019-03-17 DIAGNOSIS — K219 Gastro-esophageal reflux disease without esophagitis: Secondary | ICD-10-CM | POA: Diagnosis not present

## 2019-03-17 DIAGNOSIS — M549 Dorsalgia, unspecified: Secondary | ICD-10-CM | POA: Diagnosis not present

## 2019-03-17 DIAGNOSIS — G238 Other specified degenerative diseases of basal ganglia: Secondary | ICD-10-CM | POA: Diagnosis not present

## 2019-03-17 DIAGNOSIS — H1859 Other hereditary corneal dystrophies: Secondary | ICD-10-CM | POA: Diagnosis not present

## 2019-03-17 DIAGNOSIS — R1310 Dysphagia, unspecified: Secondary | ICD-10-CM | POA: Diagnosis not present

## 2019-03-19 DIAGNOSIS — R1310 Dysphagia, unspecified: Secondary | ICD-10-CM | POA: Diagnosis not present

## 2019-03-19 DIAGNOSIS — G238 Other specified degenerative diseases of basal ganglia: Secondary | ICD-10-CM | POA: Diagnosis not present

## 2019-03-19 DIAGNOSIS — G5131 Clonic hemifacial spasm, right: Secondary | ICD-10-CM | POA: Diagnosis not present

## 2019-03-19 DIAGNOSIS — K219 Gastro-esophageal reflux disease without esophagitis: Secondary | ICD-10-CM | POA: Diagnosis not present

## 2019-03-19 DIAGNOSIS — I10 Essential (primary) hypertension: Secondary | ICD-10-CM | POA: Diagnosis not present

## 2019-03-19 DIAGNOSIS — R202 Paresthesia of skin: Secondary | ICD-10-CM | POA: Diagnosis not present

## 2019-03-19 DIAGNOSIS — H1859 Other hereditary corneal dystrophies: Secondary | ICD-10-CM | POA: Diagnosis not present

## 2019-03-19 DIAGNOSIS — M549 Dorsalgia, unspecified: Secondary | ICD-10-CM | POA: Diagnosis not present

## 2019-03-19 DIAGNOSIS — M1712 Unilateral primary osteoarthritis, left knee: Secondary | ICD-10-CM | POA: Diagnosis not present

## 2019-03-19 DIAGNOSIS — F330 Major depressive disorder, recurrent, mild: Secondary | ICD-10-CM | POA: Diagnosis not present

## 2019-03-24 DIAGNOSIS — K219 Gastro-esophageal reflux disease without esophagitis: Secondary | ICD-10-CM | POA: Diagnosis not present

## 2019-03-24 DIAGNOSIS — R1310 Dysphagia, unspecified: Secondary | ICD-10-CM | POA: Diagnosis not present

## 2019-03-24 DIAGNOSIS — R202 Paresthesia of skin: Secondary | ICD-10-CM | POA: Diagnosis not present

## 2019-03-24 DIAGNOSIS — H1859 Other hereditary corneal dystrophies: Secondary | ICD-10-CM | POA: Diagnosis not present

## 2019-03-24 DIAGNOSIS — I10 Essential (primary) hypertension: Secondary | ICD-10-CM | POA: Diagnosis not present

## 2019-03-24 DIAGNOSIS — M1712 Unilateral primary osteoarthritis, left knee: Secondary | ICD-10-CM | POA: Diagnosis not present

## 2019-03-24 DIAGNOSIS — M549 Dorsalgia, unspecified: Secondary | ICD-10-CM | POA: Diagnosis not present

## 2019-03-24 DIAGNOSIS — F330 Major depressive disorder, recurrent, mild: Secondary | ICD-10-CM | POA: Diagnosis not present

## 2019-03-24 DIAGNOSIS — G5131 Clonic hemifacial spasm, right: Secondary | ICD-10-CM | POA: Diagnosis not present

## 2019-03-24 DIAGNOSIS — G238 Other specified degenerative diseases of basal ganglia: Secondary | ICD-10-CM | POA: Diagnosis not present

## 2019-03-26 DIAGNOSIS — H1859 Other hereditary corneal dystrophies: Secondary | ICD-10-CM | POA: Diagnosis not present

## 2019-03-26 DIAGNOSIS — I10 Essential (primary) hypertension: Secondary | ICD-10-CM | POA: Diagnosis not present

## 2019-03-26 DIAGNOSIS — R1310 Dysphagia, unspecified: Secondary | ICD-10-CM | POA: Diagnosis not present

## 2019-03-26 DIAGNOSIS — R202 Paresthesia of skin: Secondary | ICD-10-CM | POA: Diagnosis not present

## 2019-03-26 DIAGNOSIS — M549 Dorsalgia, unspecified: Secondary | ICD-10-CM | POA: Diagnosis not present

## 2019-03-26 DIAGNOSIS — F330 Major depressive disorder, recurrent, mild: Secondary | ICD-10-CM | POA: Diagnosis not present

## 2019-03-26 DIAGNOSIS — G238 Other specified degenerative diseases of basal ganglia: Secondary | ICD-10-CM | POA: Diagnosis not present

## 2019-03-26 DIAGNOSIS — K219 Gastro-esophageal reflux disease without esophagitis: Secondary | ICD-10-CM | POA: Diagnosis not present

## 2019-03-26 DIAGNOSIS — M1712 Unilateral primary osteoarthritis, left knee: Secondary | ICD-10-CM | POA: Diagnosis not present

## 2019-03-26 DIAGNOSIS — G5131 Clonic hemifacial spasm, right: Secondary | ICD-10-CM | POA: Diagnosis not present

## 2019-03-30 ENCOUNTER — Telehealth: Payer: Self-pay

## 2019-03-30 DIAGNOSIS — F330 Major depressive disorder, recurrent, mild: Secondary | ICD-10-CM | POA: Diagnosis not present

## 2019-03-30 DIAGNOSIS — R1310 Dysphagia, unspecified: Secondary | ICD-10-CM | POA: Diagnosis not present

## 2019-03-30 DIAGNOSIS — R202 Paresthesia of skin: Secondary | ICD-10-CM | POA: Diagnosis not present

## 2019-03-30 DIAGNOSIS — G238 Other specified degenerative diseases of basal ganglia: Secondary | ICD-10-CM | POA: Diagnosis not present

## 2019-03-30 DIAGNOSIS — K219 Gastro-esophageal reflux disease without esophagitis: Secondary | ICD-10-CM | POA: Diagnosis not present

## 2019-03-30 DIAGNOSIS — G5131 Clonic hemifacial spasm, right: Secondary | ICD-10-CM | POA: Diagnosis not present

## 2019-03-30 DIAGNOSIS — M1712 Unilateral primary osteoarthritis, left knee: Secondary | ICD-10-CM | POA: Diagnosis not present

## 2019-03-30 DIAGNOSIS — H1859 Other hereditary corneal dystrophies: Secondary | ICD-10-CM | POA: Diagnosis not present

## 2019-03-30 DIAGNOSIS — I10 Essential (primary) hypertension: Secondary | ICD-10-CM | POA: Diagnosis not present

## 2019-03-30 DIAGNOSIS — M549 Dorsalgia, unspecified: Secondary | ICD-10-CM | POA: Diagnosis not present

## 2019-03-30 NOTE — Telephone Encounter (Signed)
Requesting verbal for telephone visit Audio and video visit  Once a week for 5 weeks will start this week.  Also would like to attend her PT visits  2 TIMES for 2 WEEKS and then Once a week for 1 week starting 04/19/19  Verbal given Please be aware Co-sign vernal given

## 2019-04-02 ENCOUNTER — Ambulatory Visit (INDEPENDENT_AMBULATORY_CARE_PROVIDER_SITE_OTHER): Payer: Medicare Other | Admitting: Neurology

## 2019-04-02 ENCOUNTER — Other Ambulatory Visit: Payer: Self-pay

## 2019-04-02 DIAGNOSIS — G5139 Clonic hemifacial spasm, unspecified: Secondary | ICD-10-CM | POA: Diagnosis not present

## 2019-04-02 MED ORDER — ONABOTULINUMTOXINA 100 UNITS IJ SOLR
12.0000 [IU] | Freq: Once | INTRAMUSCULAR | Status: AC
  Administered 2019-04-02: 12 [IU] via INTRAMUSCULAR

## 2019-04-02 NOTE — Procedures (Signed)
Botulinum Clinic   History:  Diagnosis: Hemifacial spasm   Initial side: right   Result History  Pt reports that it helped.   Consent obtained from: The patient Benefits discussed included, but were not limited to decreased muscle tightness, increased joint range of motion, and decreased pain.  Risk discussed included, but were not limited pain and discomfort, bleeding, bruising, excessive weakness, venous thrombosis, muscle atrophy and dysphagia.  A copy of the patient medication guide was given to the patient which explains the blackbox warning.  Patients identity and treatment sites confirmed Yes.  .  Details of Procedure: Skin was cleaned with alcohol.  A 30 gauge, 1/2 inch needle was introduced to target mm.  Prior to injection, the needle plunger was aspirated to make sure the needle was not within a blood vessel.  There was no blood retrieved on aspiration.    Following is a summary of the muscles injected  And the amount of Botulinum toxin used:  Injections  Location Left  Right Units Number of sites        Corrugator      Frontalis      Lower Lid, Lateral  2.5 2.5   Lower Lid Medial      Upper Lid, Lateral  2.5 2.5   Upper Lid, Medial      Canthus  5.0 5.0   nasalis  2.5 2.5   Masseter      Procerus      Zygomaticus Major      TOTAL UNITS:   12.5    Agent: Botulinum Type A ( Onobotulinum Toxin type A ).  1 vials of Botox were used, each containing 50 units and freshly diluted with 2 mL of sterile, non-perserved saline   Total injected (Units): 12.5  Total wasted (Units): 15 Pt tolerated procedure well without complications.   Reinjection is anticipated in 3 months.

## 2019-04-05 DIAGNOSIS — R1310 Dysphagia, unspecified: Secondary | ICD-10-CM | POA: Diagnosis not present

## 2019-04-05 DIAGNOSIS — H1859 Other hereditary corneal dystrophies: Secondary | ICD-10-CM | POA: Diagnosis not present

## 2019-04-05 DIAGNOSIS — I10 Essential (primary) hypertension: Secondary | ICD-10-CM | POA: Diagnosis not present

## 2019-04-05 DIAGNOSIS — K219 Gastro-esophageal reflux disease without esophagitis: Secondary | ICD-10-CM | POA: Diagnosis not present

## 2019-04-05 DIAGNOSIS — M549 Dorsalgia, unspecified: Secondary | ICD-10-CM | POA: Diagnosis not present

## 2019-04-05 DIAGNOSIS — F330 Major depressive disorder, recurrent, mild: Secondary | ICD-10-CM | POA: Diagnosis not present

## 2019-04-05 DIAGNOSIS — R202 Paresthesia of skin: Secondary | ICD-10-CM | POA: Diagnosis not present

## 2019-04-05 DIAGNOSIS — G238 Other specified degenerative diseases of basal ganglia: Secondary | ICD-10-CM | POA: Diagnosis not present

## 2019-04-05 DIAGNOSIS — M1712 Unilateral primary osteoarthritis, left knee: Secondary | ICD-10-CM | POA: Diagnosis not present

## 2019-04-05 DIAGNOSIS — G5131 Clonic hemifacial spasm, right: Secondary | ICD-10-CM | POA: Diagnosis not present

## 2019-04-12 DIAGNOSIS — M549 Dorsalgia, unspecified: Secondary | ICD-10-CM | POA: Diagnosis not present

## 2019-04-12 DIAGNOSIS — R1310 Dysphagia, unspecified: Secondary | ICD-10-CM | POA: Diagnosis not present

## 2019-04-12 DIAGNOSIS — F330 Major depressive disorder, recurrent, mild: Secondary | ICD-10-CM | POA: Diagnosis not present

## 2019-04-12 DIAGNOSIS — G5131 Clonic hemifacial spasm, right: Secondary | ICD-10-CM | POA: Diagnosis not present

## 2019-04-12 DIAGNOSIS — H1859 Other hereditary corneal dystrophies: Secondary | ICD-10-CM | POA: Diagnosis not present

## 2019-04-12 DIAGNOSIS — I10 Essential (primary) hypertension: Secondary | ICD-10-CM | POA: Diagnosis not present

## 2019-04-12 DIAGNOSIS — M1712 Unilateral primary osteoarthritis, left knee: Secondary | ICD-10-CM | POA: Diagnosis not present

## 2019-04-12 DIAGNOSIS — G238 Other specified degenerative diseases of basal ganglia: Secondary | ICD-10-CM | POA: Diagnosis not present

## 2019-04-12 DIAGNOSIS — R202 Paresthesia of skin: Secondary | ICD-10-CM | POA: Diagnosis not present

## 2019-04-12 DIAGNOSIS — K219 Gastro-esophageal reflux disease without esophagitis: Secondary | ICD-10-CM | POA: Diagnosis not present

## 2019-04-15 ENCOUNTER — Telehealth: Payer: Self-pay

## 2019-04-15 NOTE — Telephone Encounter (Signed)
Spoke with Liji she requesting verbal order for patient missed visit.  Verbal given.

## 2019-04-22 ENCOUNTER — Telehealth: Payer: Self-pay

## 2019-04-22 NOTE — Telephone Encounter (Signed)
Lji, PT from [facility] called requesting verbal order for miss visit. 1 miss visit to r/s for next week.  Called verbal order given

## 2019-04-27 DIAGNOSIS — R202 Paresthesia of skin: Secondary | ICD-10-CM | POA: Diagnosis not present

## 2019-04-27 DIAGNOSIS — G5131 Clonic hemifacial spasm, right: Secondary | ICD-10-CM | POA: Diagnosis not present

## 2019-04-27 DIAGNOSIS — K219 Gastro-esophageal reflux disease without esophagitis: Secondary | ICD-10-CM | POA: Diagnosis not present

## 2019-04-27 DIAGNOSIS — H1859 Other hereditary corneal dystrophies: Secondary | ICD-10-CM | POA: Diagnosis not present

## 2019-04-27 DIAGNOSIS — M549 Dorsalgia, unspecified: Secondary | ICD-10-CM | POA: Diagnosis not present

## 2019-04-27 DIAGNOSIS — F330 Major depressive disorder, recurrent, mild: Secondary | ICD-10-CM | POA: Diagnosis not present

## 2019-04-27 DIAGNOSIS — G238 Other specified degenerative diseases of basal ganglia: Secondary | ICD-10-CM | POA: Diagnosis not present

## 2019-04-27 DIAGNOSIS — R1310 Dysphagia, unspecified: Secondary | ICD-10-CM | POA: Diagnosis not present

## 2019-04-27 DIAGNOSIS — I10 Essential (primary) hypertension: Secondary | ICD-10-CM | POA: Diagnosis not present

## 2019-04-27 DIAGNOSIS — M1712 Unilateral primary osteoarthritis, left knee: Secondary | ICD-10-CM | POA: Diagnosis not present

## 2019-05-04 DIAGNOSIS — F330 Major depressive disorder, recurrent, mild: Secondary | ICD-10-CM | POA: Diagnosis not present

## 2019-05-04 DIAGNOSIS — R202 Paresthesia of skin: Secondary | ICD-10-CM | POA: Diagnosis not present

## 2019-05-04 DIAGNOSIS — K219 Gastro-esophageal reflux disease without esophagitis: Secondary | ICD-10-CM | POA: Diagnosis not present

## 2019-05-04 DIAGNOSIS — H1859 Other hereditary corneal dystrophies: Secondary | ICD-10-CM | POA: Diagnosis not present

## 2019-05-04 DIAGNOSIS — G238 Other specified degenerative diseases of basal ganglia: Secondary | ICD-10-CM | POA: Diagnosis not present

## 2019-05-04 DIAGNOSIS — M1712 Unilateral primary osteoarthritis, left knee: Secondary | ICD-10-CM | POA: Diagnosis not present

## 2019-05-04 DIAGNOSIS — E785 Hyperlipidemia, unspecified: Secondary | ICD-10-CM | POA: Diagnosis not present

## 2019-05-04 DIAGNOSIS — G5131 Clonic hemifacial spasm, right: Secondary | ICD-10-CM | POA: Diagnosis not present

## 2019-05-04 DIAGNOSIS — I10 Essential (primary) hypertension: Secondary | ICD-10-CM | POA: Diagnosis not present

## 2019-05-04 DIAGNOSIS — M549 Dorsalgia, unspecified: Secondary | ICD-10-CM | POA: Diagnosis not present

## 2019-05-04 DIAGNOSIS — R1310 Dysphagia, unspecified: Secondary | ICD-10-CM | POA: Diagnosis not present

## 2019-05-04 DIAGNOSIS — Z9181 History of falling: Secondary | ICD-10-CM | POA: Diagnosis not present

## 2019-05-04 DIAGNOSIS — Z96659 Presence of unspecified artificial knee joint: Secondary | ICD-10-CM | POA: Diagnosis not present

## 2019-05-04 DIAGNOSIS — Z66 Do not resuscitate: Secondary | ICD-10-CM | POA: Diagnosis not present

## 2019-05-07 ENCOUNTER — Other Ambulatory Visit: Payer: Self-pay | Admitting: Neurology

## 2019-05-07 DIAGNOSIS — N131 Hydronephrosis with ureteral stricture, not elsewhere classified: Secondary | ICD-10-CM | POA: Diagnosis not present

## 2019-05-11 NOTE — Telephone Encounter (Signed)
Provider approved Rx

## 2019-05-11 NOTE — Telephone Encounter (Signed)
Requested Prescriptions   Pending Prescriptions Disp Refills  . carbidopa-levodopa (SINEMET IR) 25-100 MG tablet [Pharmacy Med Name: Carbidopa/Levodopa 25mg -100mg  Tablet] 630 tablet 0    Sig: TAKE 1 TABLET BY MOUTH 6 TIMES A DAY Take 1 extra daily PRN  . clonazePAM (KLONOPIN) 0.5 MG tablet [Pharmacy Med Name: Clonazepam 0.5mg  Tablet] 90 tablet 0    Sig: Take 1 tablet (0.5 mg total) by mouth at bedtime.  . carbidopa-levodopa (SINEMET CR) 50-200 MG tablet [Pharmacy Med Name: Carbidopa/Levodopa 50mg -200mg  Extended-Release Tablet] 90 tablet 0    Sig: Take 1 tablet by mouth at bedtime.   Rx last filled:12/02/18 #90 1 refills; #360 1 refills; #90 1 refills  Pt last seen:03/04/19   Follow up appt scheduled: 07/09/19

## 2019-06-08 ENCOUNTER — Ambulatory Visit (INDEPENDENT_AMBULATORY_CARE_PROVIDER_SITE_OTHER): Payer: Medicare Other | Admitting: Psychology

## 2019-06-08 DIAGNOSIS — F0631 Mood disorder due to known physiological condition with depressive features: Secondary | ICD-10-CM

## 2019-06-09 ENCOUNTER — Other Ambulatory Visit: Payer: Self-pay

## 2019-06-09 ENCOUNTER — Telehealth (INDEPENDENT_AMBULATORY_CARE_PROVIDER_SITE_OTHER): Payer: Medicare Other | Admitting: Family Medicine

## 2019-06-09 ENCOUNTER — Encounter: Payer: Self-pay | Admitting: Family Medicine

## 2019-06-09 VITALS — BP 155/105 | Wt 227.0 lb

## 2019-06-09 DIAGNOSIS — M25532 Pain in left wrist: Secondary | ICD-10-CM

## 2019-06-09 DIAGNOSIS — M25531 Pain in right wrist: Secondary | ICD-10-CM | POA: Diagnosis not present

## 2019-06-09 MED ORDER — ATENOLOL 100 MG PO TABS: 100.0000 mg | ORAL_TABLET | Freq: Every day | ORAL | 0 refills | Status: DC

## 2019-06-09 NOTE — Progress Notes (Signed)
Telemedicine Encounter- SOAP NOTE Established Patient  This telephone encounter was conducted with the patient's (or proxy's) verbal consent via audio telecommunications: yes/no: Yes Patient was instructed to have this encounter in a suitably private space; and to only have persons present to whom they give permission to participate. In addition, patient identity was confirmed by use of name plus two identifiers (DOB and address).  I discussed the limitations, risks, security and privacy concerns of performing an evaluation and management service by telephone and the availability of in person appointments. I also discussed with the patient that there may be a patient responsible charge related to this service. The patient expressed understanding and agreed to proceed.  I spent a total of TIME; 0 MIN TO 60 MIN: 15 minutes talking with the patient or their proxy.  CC: bilateral wrist pain, hypertension  Subjective   Jacqueline Atkinson is a 63 y.o. established patient. Telephone visit today for  HPI   Hypertension: Patient here for follow-up of elevated blood pressure. She is not exercising and is not adherent to low salt diet.  Blood pressure is not well controlled at home. Cardiac symptoms none. Patient denies chest pain, chest pressure/discomfort, claudication, exertional chest pressure/discomfort, irregular heart beat, lower extremity edema and orthopnea.  Cardiovascular risk factors: hypertension. Use of agents associated with hypertension: none. History of target organ damage: none. Reports that her home bp reading is typically 245-809 systolic and diastolic bp 98P-382  BP Readings from Last 3 Encounters:  06/09/19 (!) 155/105  03/04/19 137/88  10/09/18 118/74    Bilateral wrist pain Pt was repositioning herself in bed and overextended her right thumb and felt wrist pain She was using the brace and has been using her left hand more  This all started 2 months ago She states that she  has less grip strength She pushes backwards with her hands and if she has to get up off the toilet she pulls using her grab bars  She reports that she has been having pain and weakness in the wrist and uses her upper extremities to help her mobility since she has weak lower extremities.   She has [place] which causes her lower extremity to feel like concrete.  Patient Active Problem List   Diagnosis Date Noted  . Chronic constipation 09/30/2018  . Dry mouth 09/30/2018  . 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 Dry eye 09/30/2018  . Hydronephrosis 05/04/2018  . Flank pain 05/04/2018  . Intra-abdominal and pelvic swelling, mass and lump, unspecified site 04/02/2018  . Pelvic mass 03/14/2018  . Dyspnea on exertion 08/05/2017  . Hyperlipidemia 04/23/2016  . Arthritis 04/23/2016  . 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 Postoperative anemia due to acute blood loss 08/29/2015  . 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 Gait difficulty 12/29/2013  . Back pain 12/29/2013  . Paresthesia 12/19/2012  . Essential hypertension 11/28/2012  . Polypharmacy 11/28/2012  . GERD (gastroesophageal reflux disease) 11/28/2012    Past Medical History:  Diagnosis Date  . Anemia    hx of  . Anxiety   . Arthritis    Left knee  osteoarthritis  . GERD (gastroesophageal reflux disease)   . Heart murmur   . History of hiatal hernia    small  . Hx of seasonal allergies    occ. use OTC Mucinex or Antihistamine.  . Hyperlipidemia   . Hypertension   . Neuromuscular disorder ([place])    "multi system atrophy disease"- "pain, bilateral foot  numbness, left knee pain"- Dr. Carles Collet is following.  . Ovarian cancer on left ([place]) 2019   serous borderline s/p BSO  . Pneumonia   . Shoulder disorder    right shoulder "rigidity due to Multi system atrophy"- ROM not limited"just tires me"    Current Outpatient Medications  Medication Sig Dispense Refill  . acetaminophen (TYLENOL) 500 MG  tablet Take 1,000 mg by mouth every 8 (eight) hours as needed for moderate pain.     Marland Kitchen AMBULATORY NON FORMULARY MEDICATION Wheelchair Ramp  DX: G23.8 1 Device 0  . carbidopa-levodopa (SINEMET CR) 50-200 MG tablet Take 1 tablet by mouth at bedtime. 90 tablet 0  . carbidopa-levodopa (SINEMET IR) 25-100 MG tablet TAKE 1 TABLET BY MOUTH 6 TIMES A DAY Take 1 extra daily PRN 630 tablet 0  . clonazePAM (KLONOPIN) 0.5 MG tablet Take 1 tablet (0.5 mg total) by mouth at bedtime. 90 tablet 1  . cloNIDine (CATAPRES) 0.2 MG tablet Take one tablet by mouth every morning and 2 tablets by mouth at bedtime. 270 tablet 1  . DULoxetine (CYMBALTA) 30 MG capsule Take 1 capsule (30 mg total) by mouth daily. 30 capsule 5  . esomeprazole (NEXIUM) 20 MG capsule Take 40 mg by mouth 2 (two) times daily. 2 tab in morning and 2 tab at night    . hydrALAZINE (APRESOLINE) 50 MG tablet TAKE 1 TABLET BY MOUTH 4 TIMES DAILY AS NEEDED FOR ELEVATED BP AND TAKE ONE A NIGHT TIME 360 tablet 0  . mirabegron ER (MYRBETRIQ) 50 MG TB24 tablet Take 50 mg by mouth daily.    Marland Kitchen oxybutynin (DITROPAN) 5 MG tablet Take 5 mg by mouth 2 (two) times daily.   1  . senna-docusate (SENOKOT S) 8.6-50 MG tablet Take 2 tablets by mouth at bedtime. (Patient taking differently: Take 2 tablets by mouth daily. 2 tab in morning and 2 at night)    . telmisartan (MICARDIS) 80 MG tablet Take 1 tablet (80 mg total) by mouth daily. 90 tablet 3  . atenolol (TENORMIN) 100 MG tablet Take 1 tablet by mouth once daily. (Patient not taking: Reported on 06/09/2019) 30 tablet 0  . baclofen (LIORESAL) 10 MG tablet Take 10 mg by mouth. Take 1 tab every 8 hr    . Carboxymethylcellul-Glycerin (LUBRICATING EYE DROPS OP) Place 1 drop into both eyes 3 (three) times daily as needed (dry eyes).     No current facility-administered medications for this visit.     Allergies  Allergen Reactions  . Sulfa Antibiotics     Long time ago and does not remember reaction    Social  History   Socioeconomic History  . Marital status: Divorced    Spouse name: Not on file  . Number of children: 7  . Years of education: 108  . Highest education level: Not on file  Occupational History  . Occupation: ACTIVITY COORDINATOR    Employer: [employer]  Social Needs  . Financial resource strain: Not on file  . Food insecurity    Worry: Not on file    Inability: Not on file  . Transportation needs    Medical: Not on file    Non-medical: Not on file  Tobacco Use  . Smoking status: Former Smoker    Packs/day: 1.00    Years: 12.00    Pack years: 12.00    Quit date: 03/14/1988    Years since quitting: 31.2  . Smokeless tobacco: Never Used  Substance and Sexual  Activity  . Alcohol use: No  . Drug use: No    Comment: edible ebrownies  months ago for pain  . Sexual activity: Not Currently  Lifestyle  . Physical activity    Days per week: Not on file    Minutes per session: Not on file  . Stress: Not on file  Relationships  . Social Herbalist on phone: Not on file    Gets together: Not on file    Attends religious service: Not on file    Active member of club or organization: Not on file    Attends meetings of clubs or organizations: Not on file    Relationship status: Not on file  . Intimate partner violence    Fear of current or ex partner: Not on file    Emotionally abused: Not on file    Physically abused: Not on file    Forced sexual activity: Not on file  Other Topics Concern  . Not on file  Social History Narrative  . Not on file    ROS  Objective   Vitals as reported by the patient: Today's Vitals   06/09/19 1118  BP: (!) 155/105  Weight: 227 lb (103 kg)    There are no diagnoses linked to this encounter.   I discussed the assessment and treatment plan with the patient. The patient was provided an opportunity to ask questions and all were answered. The patient agreed with the plan and demonstrated an understanding  of the instructions.   The patient was advised to call back or seek an in-person evaluation if the symptoms worsen or if the condition fails to improve as anticipated.  I provided 15 minutes of non-face-to-face time during this encounter.  Forrest Moron, MD  Primary Care at [facility]

## 2019-06-09 NOTE — Patient Instructions (Signed)
° ° ° °  If you have lab work done today you will be contacted with your lab results within the next 2 weeks.  If you have not heard from us then please contact us. The fastest way to get your results is to register for My Chart. ° ° °IF you received an x-ray today, you will receive an invoice from [facility]. Please contact [facility] at [phone] with questions or concerns regarding your invoice.  ° °IF you received labwork today, you will receive an invoice from [facility]. Please contact [facility] at [phone] with questions or concerns regarding your invoice.  ° °Our billing staff will not be able to assist you with questions regarding bills from these companies. ° °You will be contacted with the lab results as soon as they are available. The fastest way to get your results is to activate your My Chart account. Instructions are located on the last page of this paperwork. If you have not heard from us regarding the results in 2 weeks, please contact this office. °  ° ° ° °

## 2019-06-09 NOTE — Progress Notes (Signed)
Cc: blood pressure/medication refill on atenolol.  Tendinitis in both wrist, wearing wrist brace, taking tylenol for pain because nsaids cause her not to be able to swallow.  Pt would like suggestions on how to relieve the pain. No travel outside [place] or [place] in the past 3 weeks.  Pt recent weight and  bp noted in vitals tab.  Pt is on medication for depression.

## 2019-06-15 ENCOUNTER — Encounter: Payer: Self-pay | Admitting: Orthopaedic Surgery

## 2019-06-15 ENCOUNTER — Ambulatory Visit: Payer: Self-pay

## 2019-06-15 ENCOUNTER — Ambulatory Visit (INDEPENDENT_AMBULATORY_CARE_PROVIDER_SITE_OTHER): Payer: Medicare Other | Admitting: Orthopaedic Surgery

## 2019-06-15 VITALS — Ht 68.0 in | Wt 227.0 lb

## 2019-06-15 DIAGNOSIS — M25532 Pain in left wrist: Secondary | ICD-10-CM | POA: Diagnosis not present

## 2019-06-15 DIAGNOSIS — M25531 Pain in right wrist: Secondary | ICD-10-CM | POA: Diagnosis not present

## 2019-06-15 NOTE — Progress Notes (Signed)
Office Visit Note   Patient: Jacqueline Atkinson           Date of Birth: 12/10/1955           MRN: [id number] Visit Date: 06/15/2019              Requested by: Forrest Moron, MD [place],  [place] [address] PCP: Shawnee Knapp, MD   Assessment & Plan: Visit Diagnoses:  1. 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  I recommend a cortisone injection for the left wrist which Dr. Junius Roads performed under ultrasonic guidance today.  She will continue use the brace as needed.  Her right wrist is overall doing fine without significant discomfort and so she will continue to treat this symptomatically.  I will see her back as needed.  Follow-Up Instructions: Return if symptoms worsen or fail to improve.   Orders:  Orders Placed This Encounter  Procedures  . XR Wrist 2 Views Right  . XR Wrist 2 Views Left   No orders of the defined types were placed in this encounter.     Procedures: No procedures performed   Clinical Data: No additional findings.   Subjective: Chief Complaint  Patient presents with  . Left Wrist - Pain  . Right Wrist - Pain    Jacqueline Atkinson is a 63 year old female who comes in for evaluation of bilateral wrist pain worse on the left.  This has been going on for several months.  She states that it is painful to squeeze the brake on her walker.  She has multiple system atrophy which causes progressive worsening in her gait and balance and mobility.  She takes Tylenol as needed as well as use a wrist brace and ice which has not helped to a significant degree.  Denies any swelling or numbness and tingling.   Review of Systems  Constitutional: Negative.   HENT: Negative.   Eyes: Negative.   Respiratory: Negative.   Cardiovascular: Negative.   Endocrine: Negative.   Musculoskeletal: Negative.   Neurological: Negative.   Hematological: Negative.   Psychiatric/Behavioral: Negative.   All other  systems reviewed and are negative.    Objective: Vital Signs: Ht 5\' 8"  (1.727 m)   Wt 227 lb (103 kg)   BMI 34.52 kg/m   Physical Exam Vitals signs and nursing note reviewed.  Constitutional:      Appearance: She is well-developed.  HENT:     Head: Normocephalic and atraumatic.  Neck:     Musculoskeletal: Neck supple.  Pulmonary:     Effort: Pulmonary effort is normal.  Abdominal:     Palpations: Abdomen is soft.  Skin:    General: Skin is warm.     Capillary Refill: Capillary refill takes less than 2 seconds.  Neurological:     Mental Status: She is alert and oriented to person, place, and time.  Psychiatric:        Behavior: Behavior normal.        Thought Content: Thought content normal.        Judgment: Judgment normal.     Ortho Exam Left wrist exam shows painful wrist flexion and extension.  Mild discomfort with palpation of the first dorsal wrist compartment.  Negative Finkelstein's.  Negative grind test. Right wrist exam does not show significant tenderness to palpation.  There is no swelling or bogginess of the wrist joint. Specialty Comments:  No specialty  comments available.  Imaging: Xr Wrist 2 Views Left  Result Date: 06/15/2019 Moderate degenerative changes of the carpal joint.  Xr Wrist 2 Views Right  Result Date: 06/15/2019 Moderate degenerative changes of the radiocarpal joint.    PMFS History: Patient Active Problem List   Diagnosis Date Noted  . Chronic constipation 09/30/2018  . Dry mouth 09/30/2018  . 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 Dry eye 09/30/2018  . Hydronephrosis 05/04/2018  . Flank pain 05/04/2018  . Intra-abdominal and pelvic swelling, mass and lump, unspecified site 04/02/2018  . Pelvic mass 03/14/2018  . Dyspnea on exertion 08/05/2017  . Hyperlipidemia 04/23/2016  . Arthritis 04/23/2016  . Multiple system atrophy C ([place]) 10/06/2015  . Postoperative anemia due to acute blood loss 08/29/2015  . History  of total knee arthroplasty 08/22/2015  . Gait difficulty 12/29/2013  . Back pain 12/29/2013  . Paresthesia 12/19/2012  . Essential hypertension 11/28/2012  . Polypharmacy 11/28/2012  . GERD (gastroesophageal reflux disease) 11/28/2012   Past Medical History:  Diagnosis Date  . Anemia    hx of  . Anxiety   . Arthritis    Left knee  osteoarthritis  . GERD (gastroesophageal reflux disease)   . Heart murmur   . History of hiatal hernia    small  . Hx of seasonal allergies    occ. use OTC Mucinex or Antihistamine.  . Hyperlipidemia   . Hypertension   . Neuromuscular disorder ([place])    "multi system atrophy disease"- "pain, bilateral foot numbness, left knee pain"- Dr. Carles Collet is following.  . Ovarian cancer on left ([place]) 2019   serous borderline s/p BSO  . Pneumonia   . Shoulder disorder    right shoulder "rigidity due to Multi system atrophy"- ROM not limited"just tires me"    Family History  Problem Relation Age of Onset  . Diabetes Mother   . Kidney disease Mother   . Melanoma Mother        arm  . COPD Father   . Brain cancer Brother   . Melanoma Daughter     Past Surgical History:  Procedure Laterality Date  . ABDOMINAL HYSTERECTOMY     04-02-18 Dr. Gerarda Fraction  . CYSTOSCOPY WITH RETROGRADE PYELOGRAM, URETEROSCOPY AND STENT PLACEMENT Bilateral 05/04/2018   Procedure: CYSTOSCOPY WITH RETROGRADE PYELOGRAM, URETEROSCOPY AND STENT PLACEMENT;  Surgeon: Cleon Gustin, MD;  Location: [facility];  Service: Urology;  Laterality: Bilateral;  . DILATION AND CURETTAGE, DIAGNOSTIC / THERAPEUTIC     1981 and 1998 due to missed AB  . ESOPHAGOGASTRODUODENOSCOPY ENDOSCOPY     with esophageal dilation  . FOOT SURGERY  2011   3rd metatarsal LT foot  . KNEE ARTHROSCOPY Left 02/08/2015   Procedure: LEFT KNEE ARTHROSCOPY ;  Surgeon: Latanya Maudlin, MD;  Location: [facility];  Service: Orthopedics;  Laterality: Left;  . LAPAROTOMY N/A 04/02/2018   Procedure: EXPLORATORY LAPAROTOMY;   Surgeon: Isabel Caprice, MD;  Location: [facility];  Service: Gynecology;  Laterality: N/A;  . SALPINGOOPHORECTOMY Bilateral 04/02/2018   Procedure: BILATERAL SALPINGO OOPHORECTOMY;  Surgeon: Isabel Caprice, MD;  Location: [facility];  Service: Gynecology;  Laterality: Bilateral;  . TONSILLECTOMY AND ADENOIDECTOMY    . TOTAL KNEE ARTHROPLASTY Left 08/22/2015   Procedure: LEFT TOTAL  KNEE  ARTHROPLASTY;  Surgeon: Latanya Maudlin, MD;  Location: [facility];  Service: Orthopedics;  Laterality: Left;   Social History   Occupational History  . Occupation: ACTIVITY COORDINATOR    Employer: [employer]  Tobacco Use  . Smoking status: Former Smoker    Packs/day: 1.00    Years: 12.00    Pack years: 12.00    Quit date: 03/14/1988    Years since quitting: 31.2  . Smokeless tobacco: Never Used  Substance and Sexual Activity  . Alcohol use: No  . Drug use: No    Comment: edible ebrownies  months ago for pain  . Sexual activity: Not Currently

## 2019-06-15 NOTE — Progress Notes (Signed)
Subjective: She is here for ultrasound-guided left wrist injection.  History of DJD with dorsal/radial pain.  Objective: Tender over the first dorsal compartment but also over the dorsal radial side of the wrist joint.  No warmth or erythema.  Limited diagnostic ultrasound shows hyperemia with power Doppler imaging.  Procedure: Ultrasound-guided left wrist injection: After sterile prep with Betadine, injected 2 cc 1% lidocaine without epinephrine and 40 mg methylprednisolone into the dorsal radial joint space.  Follow-up as directed.

## 2019-06-24 ENCOUNTER — Ambulatory Visit (INDEPENDENT_AMBULATORY_CARE_PROVIDER_SITE_OTHER): Payer: Medicare Other | Admitting: Psychology

## 2019-06-24 DIAGNOSIS — F339 Major depressive disorder, recurrent, unspecified: Secondary | ICD-10-CM

## 2019-06-30 ENCOUNTER — Ambulatory Visit (INDEPENDENT_AMBULATORY_CARE_PROVIDER_SITE_OTHER): Payer: Medicare Other | Admitting: Psychology

## 2019-06-30 DIAGNOSIS — F339 Major depressive disorder, recurrent, unspecified: Secondary | ICD-10-CM | POA: Diagnosis not present

## 2019-07-04 ENCOUNTER — Other Ambulatory Visit: Payer: Self-pay | Admitting: Family Medicine

## 2019-07-06 NOTE — Telephone Encounter (Signed)
Requested medication (s) are due for refill today: yes  Requested medication (s) are on the active medication list: yes  Last refill: 07/02/2019  Future visit scheduled: no Notes to clinic: review for refill  Ordering provider and pcp are different   Requested Prescriptions  Pending Prescriptions Disp Refills   cloNIDine (CATAPRES) 0.2 MG tablet [Pharmacy Med Name: Clonidine Hydrochloride 0.2mg  Tablet] 270 tablet 1    Sig: Take 1 tablet by mouth every morning and take 2 tablets by mouth every evening.     Cardiovascular:  Alpha-2 Agonists Failed - 07/04/2019  6:05 AM      Failed - Last BP in normal range    BP Readings from Last 1 Encounters:  06/09/19 (!) 155/105         Passed - Last Heart Rate in normal range    Pulse Readings from Last 1 Encounters:  03/04/19 86         Passed - Valid encounter within last 6 months    Recent Outpatient Visits          3 weeks ago Bilateral wrist pain   [facility], MD   5 months ago Medicare annual wellness visit, subsequent   [facility], Arlie Solomons, MD   9 months ago Malignant hypertension   [facility], Laurey Arrow, MD   1 year ago Malignant hypertension   [facility], Laurey Arrow, MD   1 year ago Labile blood pressure   [facility], Laurey Arrow, MD

## 2019-07-09 ENCOUNTER — Other Ambulatory Visit: Payer: Self-pay

## 2019-07-09 ENCOUNTER — Ambulatory Visit (INDEPENDENT_AMBULATORY_CARE_PROVIDER_SITE_OTHER): Payer: Medicare Other | Admitting: Neurology

## 2019-07-09 DIAGNOSIS — G5139 Clonic hemifacial spasm, unspecified: Secondary | ICD-10-CM

## 2019-07-09 MED ORDER — ONABOTULINUMTOXINA 100 UNITS IJ SOLR
15.0000 [IU] | Freq: Once | INTRAMUSCULAR | Status: AC
  Administered 2019-07-09: 15 [IU] via INTRAMUSCULAR

## 2019-07-09 NOTE — Procedures (Signed)
[facility]   History:  Diagnosis: Hemifacial spasm   Initial side: right   Result History  Pt reports that it helped.   Consent obtained from: The patient Benefits discussed included, but were not limited to decreased muscle tightness, increased joint range of motion, and decreased pain.  Risk discussed included, but were not limited pain and discomfort, bleeding, bruising, excessive weakness, venous thrombosis, muscle atrophy and dysphagia.  A copy of the patient medication guide was given to the patient which explains the blackbox warning.  Patients identity and treatment sites confirmed Yes.  .  Details of Procedure: Skin was cleaned with alcohol.  A 30 gauge, 1/2 inch needle was introduced to target mm.  Prior to injection, the needle plunger was aspirated to make sure the needle was not within a blood vessel.  There was no blood retrieved on aspiration.    Following is a summary of the muscles injected  And the amount of Botulinum toxin used:  Injections  Location Left  Right Units Number of sites        Corrugator      Frontalis      Lower Lid, Lateral  2.5 2.5   Lower Lid Medial      Upper Lid, Lateral  2.5 2.5   Upper Lid, Medial      Canthus  5.0 5.0   nasalis  2.5 2.5   Masseter      Procerus      Zygomaticus Major      TOTAL UNITS:   12.5    Agent: Botulinum Type A ( Onobotulinum Toxin type A ).  1 vials of Botox were used, each containing 50 units and freshly diluted with 2 mL of sterile, non-perserved saline   Total injected (Units): 12.5  Total wasted (Units):2.5 Pt tolerated procedure well without complications.   Reinjection is anticipated in 3 months.

## 2019-07-15 ENCOUNTER — Ambulatory Visit (INDEPENDENT_AMBULATORY_CARE_PROVIDER_SITE_OTHER): Payer: Medicare Other | Admitting: Psychology

## 2019-07-15 DIAGNOSIS — F339 Major depressive disorder, recurrent, unspecified: Secondary | ICD-10-CM | POA: Diagnosis not present

## 2019-07-22 ENCOUNTER — Ambulatory Visit (INDEPENDENT_AMBULATORY_CARE_PROVIDER_SITE_OTHER): Payer: Medicare Other | Admitting: Psychology

## 2019-07-22 DIAGNOSIS — F339 Major depressive disorder, recurrent, unspecified: Secondary | ICD-10-CM

## 2019-07-28 ENCOUNTER — Ambulatory Visit (INDEPENDENT_AMBULATORY_CARE_PROVIDER_SITE_OTHER): Payer: Medicare Other | Admitting: Psychology

## 2019-07-28 DIAGNOSIS — F339 Major depressive disorder, recurrent, unspecified: Secondary | ICD-10-CM

## 2019-07-31 ENCOUNTER — Other Ambulatory Visit: Payer: Self-pay | Admitting: Family Medicine

## 2019-07-31 ENCOUNTER — Other Ambulatory Visit: Payer: Self-pay | Admitting: Neurology

## 2019-07-31 NOTE — Telephone Encounter (Signed)
Forwarding medication refill requests to the clinical pool for review. 

## 2019-08-02 NOTE — Telephone Encounter (Signed)
Requested Prescriptions   Pending Prescriptions Disp Refills  . carbidopa-levodopa (SINEMET IR) 25-100 MG tablet [Pharmacy Med Name: Carbidopa/Levodopa 25mg -100mg  Tablet] 630 tablet 0    Sig: Take 1 tablet by mouth 6 times daily. Take 1 extra tablet daily as needed.  . carbidopa-levodopa (SINEMET CR) 50-200 MG tablet [Pharmacy Med Name: Carbidopa/Levodopa 50mg -200mg  Extended-Release Tablet] 90 tablet 0    Sig: Take 1 tablet by mouth at bedtime.  . DULoxetine (CYMBALTA) 30 MG capsule [Pharmacy Med Name: Duloxetine 30mg  Delayed-Release Capsule] 30 capsule 1    Sig: Take 1 capsule by mouth daily.   Rx last filled:05/11/19 #630 0 refills 05/11/19 #90 0 refills 03/04/19 #30 5 refills  Pt last seen: 03/04/19  Follow up appt scheduled:08/10/19

## 2019-08-04 NOTE — Telephone Encounter (Signed)
Attempted to call pt to get her scheduled for additional refills. There was no answer so I left a message to call back.

## 2019-08-06 NOTE — Progress Notes (Signed)
Jacqueline Atkinson was seen today in follow up for MSA.  She is on carbidopa/levodopa 25/100, 1 tablet 6 times per day, and generally takes an additional at 4 AM if she wakes up to use the restroom.  She is also on carbidopa/levodopa 50/200 at bedtime.  She does admit that her sleep pattern is off.  She is going to bed at midnight and sleeping a few hours and then taking several hour naps in the day.  Tremors are worse as is freezing.  She has a fear of falling but has not had falls.  She is festinating.    Pt denies lightheadedness, near syncope.  No hallucinations.  We started her on Cymbalta, 30 mg daily for depression, as well as feet paresthesias.  She reports that she is not as depressed - "I'm not depressed any more than anyone living in this world."  She is participating in online support groups throughout the stay, including our own.  She is still doing counseling with Myra Gianotti.  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  She had physical therapy done since our last visit.  Those records have been reviewed.  She has seen orthopedics in regards to her wrist pain, felt degenerative in nature.  She had a cortisone injection for that on August 18. "that worked wonders."   She continues to receive Botox injections for her hemifacial spasm.  Finds it helpful.  Last one was done on September 11.   ALLERGIES:   Allergies  Allergen Reactions  . Sulfa Antibiotics     Long time ago and does not remember reaction    CURRENT MEDICATIONS:  Outpatient Encounter Medications as of 08/10/2019  Medication Sig  . acetaminophen (TYLENOL) 500 MG tablet Take 1,000 mg by mouth every 8 (eight) hours as needed for moderate pain.   Marland Kitchen AMBULATORY NON FORMULARY MEDICATION Wheelchair Ramp  DX: G23.8  . atenolol (TENORMIN) 100 MG tablet Take 1 tablet (100 mg total) by mouth daily.  . baclofen (LIORESAL) 10 MG tablet Take 10 mg by mouth. Take 1 tab every 8 hr  .  carbidopa-levodopa (SINEMET CR) 50-200 MG tablet Take 1 tablet by mouth at bedtime.  . carbidopa-levodopa (SINEMET IR) 25-100 MG tablet TAKE 1 TABLET BY MOUTH 6 TIMES A DAY Take 1 extra daily PRN  . Carboxymethylcellul-Glycerin (LUBRICATING EYE DROPS OP) Place 1 drop into both eyes 3 (three) times daily as needed (dry eyes).  . clonazePAM (KLONOPIN) 0.5 MG tablet Take 1 tablet (0.5 mg total) by mouth at bedtime.  . cloNIDine (CATAPRES) 0.2 MG tablet Take 1 tablet by mouth every morning and take 2 tablets by mouth every evening. Needs appt for additional refills  . DULoxetine (CYMBALTA) 30 MG capsule Take 1 capsule by mouth daily.  Marland Kitchen esomeprazole (NEXIUM) 20 MG capsule Take 40 mg by mouth 2 (two) times daily. 2 tab in morning and 2 tab at night  . hydrALAZINE (APRESOLINE) 50 MG tablet TAKE 1 TABLET BY MOUTH 4 TIMES DAILY AS NEEDED FOR ELEVATED BP AND TAKE ONE A NIGHT TIME  . mirabegron ER (MYRBETRIQ) 50 MG TB24 tablet Take 50 mg by mouth daily.  Marland Kitchen oxybutynin (DITROPAN) 5 MG tablet Take 5 mg by mouth 2 (two) times daily.   Marland Kitchen senna-docusate (SENOKOT S) 8.6-50 MG tablet Take 2 tablets by mouth at bedtime. (Patient taking differently: Take 2 tablets by mouth daily. 2 tab in morning and 2 at night)  . telmisartan (  MICARDIS) 80 MG tablet Take 1 tablet (80 mg total) by mouth daily.   No facility-administered encounter medications on file as of 08/10/2019.     PAST MEDICAL HISTORY:   Past Medical History:  Diagnosis Date  . Anemia    hx of  . Anxiety   . Arthritis    Left knee  osteoarthritis  . GERD (gastroesophageal reflux disease)   . Heart murmur   . History of hiatal hernia    small  . Hx of seasonal allergies    occ. use OTC Mucinex or Antihistamine.  . Hyperlipidemia   . Hypertension   . Neuromuscular disorder ([place])    "multi system atrophy disease"- "pain, bilateral foot numbness, left knee pain"- Dr. Carles Collet is following.  . Ovarian cancer on left ([place]) 2019   serous borderline s/p BSO   . Pneumonia   . Shoulder disorder    right shoulder "rigidity due to Multi system atrophy"- ROM not limited"just tires me"    PAST SURGICAL HISTORY:   Past Surgical History:  Procedure Laterality Date  . ABDOMINAL HYSTERECTOMY     04-02-18 Dr. Gerarda Fraction  . CYSTOSCOPY WITH RETROGRADE PYELOGRAM, URETEROSCOPY AND STENT PLACEMENT Bilateral 05/04/2018   Procedure: CYSTOSCOPY WITH RETROGRADE PYELOGRAM, URETEROSCOPY AND STENT PLACEMENT;  Surgeon: Cleon Gustin, MD;  Location: [facility];  Service: Urology;  Laterality: Bilateral;  . DILATION AND CURETTAGE, DIAGNOSTIC / THERAPEUTIC     1981 and 1998 due to missed AB  . ESOPHAGOGASTRODUODENOSCOPY ENDOSCOPY     with esophageal dilation  . FOOT SURGERY  2011   3rd metatarsal LT foot  . KNEE ARTHROSCOPY Left 02/08/2015   Procedure: LEFT KNEE ARTHROSCOPY ;  Surgeon: Latanya Maudlin, MD;  Location[facility];  Service: Orthopedics;  Laterality: Left;  . LAPAROTOMY N/A 04/02/2018   Procedure: EXPLORATORY LAPAROTOMY;  Surgeon: Isabel Caprice, MD;  Location: [facility];  Service: Gynecology;  Laterality: N/A;  . SALPINGOOPHORECTOMY Bilateral 04/02/2018   Procedure: BILATERAL SALPINGO OOPHORECTOMY;  Surgeon: Isabel Caprice, MD;  Location: [facility];  Service: Gynecology;  Laterality: Bilateral;  . TONSILLECTOMY AND ADENOIDECTOMY    . TOTAL KNEE ARTHROPLASTY Left 08/22/2015   Procedure: LEFT TOTAL  KNEE  ARTHROPLASTY;  Surgeon: Latanya Maudlin, MD;  Location[facility];  Service: Orthopedics;  Laterality: Left;    SOCIAL HISTORY:   Social History   Socioeconomic History  . Marital status: Divorced    Spouse name: Not on file  . Number of children: 7  . Years of education: 49  . Highest education level: High school graduate  Occupational History  . Occupation: ACTIVITY COORDINATOR    Employer: [employer]  Social Needs  . Financial resource strain: Not on file  . Food insecurity    Worry: Not on file    Inability: Not on  file  . Transportation needs    Medical: Not on file    Non-medical: Not on file  Tobacco Use  . Smoking status: Former Smoker    Packs/day: 1.00    Years: 12.00    Pack years: 12.00    Quit date: 03/14/1988    Years since quitting: 31.4  . Smokeless tobacco: Never Used  Substance and Sexual Activity  . Alcohol use: No  . Drug use: No    Comment: edible ebrownies  months ago for pain  . Sexual activity: Not Currently  Lifestyle  . Physical activity    Days per week: Not on file  Minutes per session: Not on file  . Stress: Not on file  Relationships  . Social Herbalist on phone: Not on file    Gets together: Not on file    Attends religious service: Not on file    Active member of club or organization: Not on file    Attends meetings of clubs or organizations: Not on file    Relationship status: Not on file  . Intimate partner violence    Fear of current or ex partner: Not on file    Emotionally abused: Not on file    Physically abused: Not on file    Forced sexual activity: Not on file  Other Topics Concern  . Not on file  Social History Narrative  . Not on file    FAMILY HISTORY:   Family Status  Relation Name Status  . Mother  Deceased at age 14       diabetes, kidney failure, melanoma  . Father  Deceased at age 59       COPD  . Brother  Deceased       brain cancer  . Sister  Alive       healthy  . Daughter  Alive       healthy  . Daughter  Alive       healthy  . Daughter  Alive       healthy  . Daughter  Alive       healthy  . Daughter  Alive       healthy  . Son  Alive       healthy  . Son  Alive       healthy    ROS:  Review of Systems  Constitutional: Positive for malaise/fatigue.  HENT: Negative.   Eyes: Negative.   Respiratory: Negative.   Cardiovascular: Negative.   Gastrointestinal: Positive for nausea.  Genitourinary: Negative.   Skin: Negative.   Neurological: Positive for tremors and weakness (generalized).     PHYSICAL EXAMINATION:    VITALS:   Vitals:   08/10/19 1101  BP: (!) 147/74  Pulse: 72  SpO2: 97%  Weight: 232 lb 9.6 oz (105.5 kg)  Height: 5\' 7"  (1.702 m)    GEN:  The patient appears stated age and is in NAD. HEENT:  Normocephalic, atraumatic.  The mucous membranes are moist. The superficial temporal arteries are without ropiness or tenderness. CV:  RRR Lungs:  CTAB Neck/HEME:  There are no carotid bruits bilaterally.  Neurological examination:  Orientation: The patient is alert and oriented x3. Cranial nerves: There is good facial symmetry with facial hypomimia. The speech is fluent and clear. Soft palate rises symmetrically and there is no tongue deviation. Hearing is intact to conversational tone. Sensation: Sensation is intact to light touch throughout Motor: Strength is at least antigravity x4.  Movement examination: Tone: There is moderate increased tone in the right upper and right lower extremity. Abnormal movements: There is intermittent right upper and lower extremity resting tremor. Coordination:  There is  decremation with RAM's bilaterally, but particularly on the right Gait and Station: The patient pushes off of her walker to arise up.  She is stooped at the waist, but less so than previous.   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  -was going to increase her levodopa but she thought that taking 2 tablets in the past caused n/v but she admits that she has an ulcerated esophagus and has nausea  anyway.  she was agreeable to try again and see what happens.  She is currently on carbidopa/levodopa 25/100, 1 po 6 times per day and an additional tablet at 4 AM when and if she wakes up to use the restroom.   she will go to carbidopa/levodopa 25/100, 2 at 9am/1 at 11am/2 at 1pm/1 at 3pm/1 at 5pm/1 at 7pm -She will continue carbidopa/levodopa 50/200 at night   -She is getting day night reversal.  We talked  about the importance of regular daily schedule.  Told her that I really want her to make a white board out with a regular schedule and to follow it daily.  -has long term care insurance but does not want to tap into that right now.  -discussed life alerts 2. Hemifacial spasm, right -s/p botox which was beneficial.   Last injections on September 11.  She would like to continue these. 3. Dysphagia. -She had a MBE on November 19, 2017. This was unremarkable, with the exception of the tablet of barium lodged at the distal esophagus at one point. Mechanical soft diet with thin liquids as recommended.She is still noticing some problems, but does better if she is very careful when eating, including taking small bites and sitting straight up. 4. Urinary incontinence -saw urology and on oxybutynin. Doing better on Myrbetriq, but having extreme dry mouth.  5. RBD -better with klonopin, 0.5 mg at night. 6.  Depression, mild             -Continue Cymbalta, 30 mg daily.    That has helped.  Discussed risk, benefits, side effects.  Patient agreeable.  -She is doing counseling with Myra Gianotti.  -Told her to continue with the support groups, such as hours, that were run professionally, but told her to get off of the Facebook groups, as they were causing her to be more depressed. 7.  Follow up is anticipated in the next 4-6 months, sooner should new neurologic issues arise.  Much greater than 50% of this visit was spent in counseling and coordinating care.  Total face to face time:  40 min  Cc:  Shawnee Knapp, MD

## 2019-08-10 ENCOUNTER — Ambulatory Visit (INDEPENDENT_AMBULATORY_CARE_PROVIDER_SITE_OTHER): Payer: Medicare Other | Admitting: Neurology

## 2019-08-10 ENCOUNTER — Encounter: Payer: Self-pay | Admitting: Neurology

## 2019-08-10 ENCOUNTER — Other Ambulatory Visit: Payer: Self-pay

## 2019-08-10 VITALS — BP 147/74 | HR 72 | Ht 67.0 in | Wt 232.6 lb

## 2019-08-10 DIAGNOSIS — G238 Other specified degenerative diseases of basal ganglia: Secondary | ICD-10-CM

## 2019-08-10 DIAGNOSIS — G5131 Clonic hemifacial spasm, right: Secondary | ICD-10-CM

## 2019-08-10 DIAGNOSIS — F330 Major depressive disorder, recurrent, mild: Secondary | ICD-10-CM

## 2019-08-10 NOTE — Patient Instructions (Signed)
Take carbidopa/levodopa 25/100, 2 at 9am/1 at 11am/2 at 1pm/1 at 3pm/1 at 5pm/1 at 7pm  Take carbidopa/levodopa 50/200 at 10pm (your new bedtime)  Get a whiteboard and write your schedule.  Mark off daily activities.  Wake up to an alarm clock.  No naps after 2 pm.  No naps longer than 45 min.  Naps are timed with an alarm clock.    The physicians and staff at [facility] are committed to providing excellent care. You may receive a survey requesting feedback about your experience at our office. We strive to receive "very good" responses to the survey questions. If you feel that your experience would prevent you from giving the office a "very good " response, please contact our office to try to remedy the situation. We may be reached at [phone]. Thank you for taking the time out of your busy day to complete the survey.

## 2019-08-12 ENCOUNTER — Ambulatory Visit (INDEPENDENT_AMBULATORY_CARE_PROVIDER_SITE_OTHER): Payer: Medicare Other | Admitting: Psychology

## 2019-08-12 DIAGNOSIS — F339 Major depressive disorder, recurrent, unspecified: Secondary | ICD-10-CM

## 2019-08-13 ENCOUNTER — Telehealth: Payer: Self-pay | Admitting: Family Medicine

## 2019-08-13 ENCOUNTER — Other Ambulatory Visit: Payer: Self-pay

## 2019-08-13 MED ORDER — CARBIDOPA-LEVODOPA ER 50-200 MG PO TBCR: 1.0000 | EXTENDED_RELEASE_TABLET | Freq: Every day | ORAL | 0 refills | Status: DC

## 2019-08-13 NOTE — Telephone Encounter (Signed)
Requested Prescriptions   Pending Prescriptions Disp Refills  . carbidopa-levodopa (SINEMET CR) 50-200 MG tablet 90 tablet 0    Sig: Take 1 tablet by mouth at bedtime.   Rx last filled:05/11/19 #90 1 refill Pt last seen:08/10/19  Follow up appt scheduled:10/08/19

## 2019-08-13 NOTE — Telephone Encounter (Signed)
hydrALAZINE (APRESOLINE) 50 MG tablet     Patient is requesting a refill of this medication. She is also requesting a call back about this medication.     [place], [place] - [place] [phone] (Phone) [phone] (Fax)

## 2019-08-18 ENCOUNTER — Other Ambulatory Visit: Payer: Self-pay | Admitting: Family Medicine

## 2019-08-18 NOTE — Telephone Encounter (Signed)
Copied from [place] [id number]. Topic: Quick Communication - Rx Refill/Question >> Aug 18, 2019  2:53 PM Mcneil, Ja-Kwan wrote: Medication: hydrALAZINE (APRESOLINE) 50 MG tablet and telmisartan (MICARDIS) 80 MG tablet   Has the patient contacted their pharmacy? yes   Preferred Pharmacy (with phone number or street name): [facility], [place] [phone] (Phone)  [phone] (Fax)  Agent: Please be advised that RX refills may take up to 3 business days. We ask that you follow-up with your pharmacy.

## 2019-08-19 ENCOUNTER — Other Ambulatory Visit: Payer: Self-pay

## 2019-08-19 ENCOUNTER — Ambulatory Visit (INDEPENDENT_AMBULATORY_CARE_PROVIDER_SITE_OTHER): Payer: Medicare Other | Admitting: Psychology

## 2019-08-19 DIAGNOSIS — F339 Major depressive disorder, recurrent, unspecified: Secondary | ICD-10-CM | POA: Diagnosis not present

## 2019-08-19 MED ORDER — CARBIDOPA-LEVODOPA 25-100 MG PO TABS: ORAL_TABLET | ORAL | 0 refills | Status: DC

## 2019-08-19 NOTE — Telephone Encounter (Signed)
Tried to call pt about medication request no answer will try again later.

## 2019-09-02 ENCOUNTER — Other Ambulatory Visit: Payer: Self-pay

## 2019-09-02 ENCOUNTER — Ambulatory Visit (INDEPENDENT_AMBULATORY_CARE_PROVIDER_SITE_OTHER): Payer: Medicare Other | Admitting: Psychology

## 2019-09-02 DIAGNOSIS — F339 Major depressive disorder, recurrent, unspecified: Secondary | ICD-10-CM

## 2019-09-02 MED ORDER — DULOXETINE HCL 30 MG PO CPEP: 30.0000 mg | ORAL_CAPSULE | Freq: Every day | ORAL | 1 refills | Status: DC

## 2019-09-03 ENCOUNTER — Encounter: Payer: Self-pay | Admitting: Registered Nurse

## 2019-09-03 ENCOUNTER — Telehealth (INDEPENDENT_AMBULATORY_CARE_PROVIDER_SITE_OTHER): Payer: Medicare Other | Admitting: Registered Nurse

## 2019-09-03 VITALS — BP 146/99 | HR 70 | Ht 67.0 in | Wt 235.0 lb

## 2019-09-03 DIAGNOSIS — I10 Essential (primary) hypertension: Secondary | ICD-10-CM | POA: Diagnosis not present

## 2019-09-03 MED ORDER — CLONIDINE HCL 0.2 MG PO TABS: ORAL_TABLET | ORAL | 0 refills | Status: DC

## 2019-09-03 MED ORDER — ATENOLOL 100 MG PO TABS: 100.0000 mg | ORAL_TABLET | Freq: Every day | ORAL | 0 refills | Status: DC

## 2019-09-03 MED ORDER — TELMISARTAN 80 MG PO TABS: 80.0000 mg | ORAL_TABLET | Freq: Every day | ORAL | 3 refills | Status: DC

## 2019-09-03 MED ORDER — HYDRALAZINE HCL 50 MG PO TABS: ORAL_TABLET | ORAL | 0 refills | Status: DC

## 2019-09-03 NOTE — Progress Notes (Signed)
Telemedicine Encounter- SOAP NOTE Established Patient  This telephone encounter was conducted with the patient's (or proxy's) verbal consent via audio telecommunications: yes  Patient was instructed to have this encounter in a suitably private space; and to only have persons present to whom they give permission to participate. In addition, patient identity was confirmed by use of name plus two identifiers (DOB and address).  I discussed the limitations, risks, security and privacy concerns of performing an evaluation and management service by telephone and the availability of in person appointments. I also discussed with the patient that there may be a patient responsible charge related to this service. The patient expressed understanding and agreed to proceed.  I spent a total of 12 minutes talking with the patient or their proxy.  Chief Complaint  Patient presents with  . Medication Refill    Subjective   Jacqueline Atkinson is a 63 y.o. established patient. Telephone visit today for med refills  HPI Needs all BP medications refilled. Currently out of clonidine and telmisartan, as such, BP has risen to 146/99 via home monitor. 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   Patient Active Problem List   Diagnosis Date Noted  . Chronic constipation 09/30/2018  . Dry mouth 09/30/2018  . 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 Dry eye 09/30/2018  . Hydronephrosis 05/04/2018  . Flank pain 05/04/2018  . Intra-abdominal and pelvic swelling, mass and lump, unspecified site 04/02/2018  . Pelvic mass  03/14/2018  . Dyspnea on exertion 08/05/2017  . Hyperlipidemia 04/23/2016  . Arthritis 04/23/2016  . 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 Postoperative anemia due to acute blood loss 08/29/2015  . 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 Gait difficulty 12/29/2013  . Back pain 12/29/2013  . Paresthesia 12/19/2012  . Essential hypertension 11/28/2012  . Polypharmacy 11/28/2012  . GERD (gastroesophageal reflux disease) 11/28/2012    Past Medical History:  Diagnosis Date  . Anemia    hx of  . Anxiety   . Arthritis    Left knee  osteoarthritis  . GERD (gastroesophageal reflux disease)   . Heart murmur   . History of hiatal hernia    small  . Hx of seasonal allergies    occ. use OTC Mucinex or Antihistamine.  . Hyperlipidemia   . Hypertension   . Neuromuscular disorder ([place])    "multi system atrophy disease"- "pain, bilateral foot numbness, left knee pain"- Dr. Carles Collet is following.  . Ovarian cancer on left ([place]) 2019   serous borderline s/p BSO  . Pneumonia   . Shoulder disorder    right shoulder "rigidity due to Multi system atrophy"- ROM not limited"just tires me"    Current Outpatient Medications  Medication Sig Dispense Refill  . acetaminophen (TYLENOL) 500 MG tablet Take 1,000 mg by mouth every 8 (eight) hours as needed for moderate pain.     Marland Kitchen AMBULATORY NON FORMULARY MEDICATION Wheelchair Ramp  DX: G23.8 1 Device 0  . atenolol (TENORMIN) 100 MG tablet Take 1 tablet (100 mg total) by mouth daily. 90 tablet 0  . carbidopa-levodopa (SINEMET CR) 50-200 MG  tablet Take 1 tablet by mouth at bedtime. 90 tablet 0  . carbidopa-levodopa (SINEMET IR) 25-100 MG tablet One tablet po 6 times a day.Take one tablets as needed 630 tablet 0  . Carboxymethylcellul-Glycerin (LUBRICATING EYE DROPS OP) Place 1 drop into both eyes 3 (three) times daily as needed (dry eyes).    . clonazePAM (KLONOPIN) 0.5 MG tablet Take 1 tablet (0.5 mg total) by mouth at bedtime. 90 tablet 1   . cloNIDine (CATAPRES) 0.2 MG tablet Take 1 tablet by mouth every morning and take 2 tablets by mouth every evening. Needs appt for additional refills 90 tablet 0  . DULoxetine (CYMBALTA) 30 MG capsule Take 1 capsule (30 mg total) by mouth daily. 90 capsule 1  . esomeprazole (NEXIUM) 20 MG capsule Take 40 mg by mouth 2 (two) times daily. 2 tab in morning and 2 tab at night    . hydrALAZINE (APRESOLINE) 50 MG tablet TAKE 1 TABLET BY MOUTH 4 TIMES DAILY AS NEEDED FOR ELEVATED BP AND TAKE ONE A NIGHT TIME 360 tablet 0  . mirabegron ER (MYRBETRIQ) 50 MG TB24 tablet Take 50 mg by mouth daily.    Marland Kitchen oxybutynin (DITROPAN) 5 MG tablet Take 5 mg by mouth 2 (two) times daily.   1  . senna-docusate (SENOKOT S) 8.6-50 MG tablet Take 2 tablets by mouth at bedtime. (Patient taking differently: Take 2 tablets by mouth daily. 2 tab in morning and 2 at night)    . telmisartan (MICARDIS) 80 MG tablet Take 1 tablet (80 mg total) by mouth daily. 90 tablet 3  . baclofen (LIORESAL) 10 MG tablet Take 10 mg by mouth. Take 1 tab every 8 hr     No current facility-administered medications for this visit.     Allergies  Allergen Reactions  . Sulfa Antibiotics     Long time ago and does not remember reaction    Social History   Socioeconomic History  . Marital status: Divorced    Spouse name: Not on file  . Number of children: 7  . Years of education: 56  . Highest education level: High school graduate  Occupational History  . Occupation: ACTIVITY COORDINATOR    Employer: [employer]  Social Needs  . Financial resource strain: Not on file  . Food insecurity    Worry: Not on file    Inability: Not on file  . Transportation needs    Medical: Not on file    Non-medical: Not on file  Tobacco Use  . Smoking status: Former Smoker    Packs/day: 1.00    Years: 12.00    Pack years: 12.00    Quit date: 03/14/1988    Years since quitting: 31.4  . Smokeless tobacco: Never Used  Substance and  Sexual Activity  . Alcohol use: No  . Drug use: No    Comment: edible ebrownies  months ago for pain  . Sexual activity: Not Currently  Lifestyle  . Physical activity    Days per week: Not on file    Minutes per session: Not on file  . Stress: Not on file  Relationships  . Social Herbalist on phone: Not on file    Gets together: Not on file    Attends religious service: Not on file    Active member of club or organization: Not on file    Attends meetings of clubs or organizations: Not on file    Relationship status: Not on file  .  Intimate partner violence    Fear of current or ex partner: Not on file    Emotionally abused: Not on file    Physically abused: Not on file    Forced sexual activity: Not on file  Other Topics Concern  . Not on file  Social History Narrative  . Not on file    ROS Per hpi  Objective   Vitals as reported by the patient: Today's Vitals   09/03/19 1616  BP: (!) 146/99  Pulse: 70  Weight: 235 lb (106.6 kg)  Height: 5\' 7"  (1.702 m)    Trenice was seen today for medication refill.  Diagnoses and all orders for this visit:  Essential hypertension -     telmisartan (MICARDIS) 80 MG tablet; Take 1 tablet (80 mg total) by mouth daily. -     hydrALAZINE (APRESOLINE) 50 MG tablet; TAKE 1 TABLET BY MOUTH 4 TIMES DAILY AS NEEDED FOR ELEVATED BP AND TAKE ONE A NIGHT TIME -     atenolol (TENORMIN) 100 MG tablet; Take 1 tablet (100 mg total) by mouth daily. -     cloNIDine (CATAPRES) 0.2 MG tablet; Take 1 tablet by mouth every morning and take 2 tablets by mouth every evening. Needs appt for additional refills   PLAN  Refill telmisartan, hydralazine, atenalol, and clonidine  Recheck BP in 1-2 weeks and submit via telephone call or mychart message  Reviewed reasons to present for in person ov or proceed to ED  Patient encouraged to call clinic with any questions, comments, or concerns.    I discussed the assessment and treatment plan  with the patient. The patient was provided an opportunity to ask questions and all were answered. The patient agreed with the plan and demonstrated an understanding of the instructions.   The patient was advised to call back or seek an in-person evaluation if the symptoms worsen or if the condition fails to improve as anticipated.  I provided 12 minutes of non-face-to-face time during this encounter.  Maximiano Coss, NP  Primary Care at [facility]

## 2019-09-16 ENCOUNTER — Ambulatory Visit (INDEPENDENT_AMBULATORY_CARE_PROVIDER_SITE_OTHER): Payer: Medicare Other | Admitting: Psychology

## 2019-09-16 DIAGNOSIS — F339 Major depressive disorder, recurrent, unspecified: Secondary | ICD-10-CM | POA: Diagnosis not present

## 2019-09-23 ENCOUNTER — Encounter (HOSPITAL_COMMUNITY): Payer: Self-pay | Admitting: Emergency Medicine

## 2019-09-23 ENCOUNTER — Emergency Department (HOSPITAL_COMMUNITY)
Admission: EM | Admit: 2019-09-23 | Discharge: 2019-09-23 | Disposition: A | Discharge status: Home / Self Care | Payer: Medicare Other | Attending: Emergency Medicine | Admitting: Emergency Medicine

## 2019-09-23 DIAGNOSIS — S0101XA Laceration without foreign body of scalp, initial encounter: Secondary | ICD-10-CM | POA: Diagnosis not present

## 2019-09-23 DIAGNOSIS — Y9301 Activity, walking, marching and hiking: Secondary | ICD-10-CM | POA: Diagnosis not present

## 2019-09-23 DIAGNOSIS — W1830XA Fall on same level, unspecified, initial encounter: Secondary | ICD-10-CM | POA: Diagnosis not present

## 2019-09-23 DIAGNOSIS — Y92019 Unspecified place in single-family (private) house as the place of occurrence of the external cause: Secondary | ICD-10-CM | POA: Insufficient documentation

## 2019-09-23 DIAGNOSIS — Z79899 Other long term (current) drug therapy: Secondary | ICD-10-CM | POA: Insufficient documentation

## 2019-09-23 DIAGNOSIS — Y999 Unspecified external cause status: Secondary | ICD-10-CM | POA: Diagnosis not present

## 2019-09-23 DIAGNOSIS — I10 Essential (primary) hypertension: Secondary | ICD-10-CM | POA: Diagnosis not present

## 2019-09-23 DIAGNOSIS — W19XXXA Unspecified fall, initial encounter: Secondary | ICD-10-CM

## 2019-09-23 DIAGNOSIS — Z87891 Personal history of nicotine dependence: Secondary | ICD-10-CM | POA: Diagnosis not present

## 2019-09-23 MED ORDER — LIDOCAINE-EPINEPHRINE (PF) 2 %-1:200000 IJ SOLN
10.0000 mL | Freq: Once | INTRAMUSCULAR | Status: AC
  Administered 2019-09-23: 10 mL
  Filled 2019-09-23: qty 20

## 2019-09-23 NOTE — ED Triage Notes (Signed)
Pt states she has a neuromuscular with hx of frequent falls- normally loses balance once a month and has a fall. Pt states today she was in bathroom when she lost her balance and fell into shower- has lac to back of head. Pt denies any blood thinners.

## 2019-09-23 NOTE — Discharge Instructions (Addendum)
Staples can be removed after 7 days

## 2019-09-23 NOTE — ED Provider Notes (Signed)
[facility] EMERGENCY DEPARTMENT Provider Note   CSN: [id number] Arrival date & time: 09/23/19  [id number]     History   Chief Complaint Chief Complaint  Patient presents with  . Fall    HPI Jacqueline Atkinson is a 63 y.o. female.     HPI  63 year old female history of neuromuscular disorder presents today after mechanical fall.  She was walking to bathroom with her walker this morning.  She states she lost her balance and fell striking the back of her head.  She has had bleeding from this area of which she has controlled with a towel she denies any other pain or injury.  Her tetanus is up-to-date.  Past Medical History:  Diagnosis Date  . Anemia    hx of  . Anxiety   . Arthritis    Left knee  osteoarthritis  . GERD (gastroesophageal reflux disease)   . Heart murmur   . History of hiatal hernia    small  . Hx of seasonal allergies    occ. use OTC Mucinex or Antihistamine.  . Hyperlipidemia   . Hypertension   . Neuromuscular disorder ([place])    "multi system atrophy disease"- "pain, bilateral foot numbness, left knee pain"- Dr. Carles Collet is following.  . Ovarian cancer on left ([place]) 2019   serous borderline s/p BSO  . Pneumonia   . Shoulder disorder    right shoulder "rigidity due to Multi system atrophy"- ROM not limited"just tires me"    Patient Active Problem List   Diagnosis Date Noted  . Chronic constipation 09/30/2018  . Dry mouth 09/30/2018  . 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 Dry eye 09/30/2018  . Hydronephrosis 05/04/2018  . Flank pain 05/04/2018  . Intra-abdominal and pelvic swelling, mass and lump, unspecified site 04/02/2018  . Pelvic mass 03/14/2018  . Dyspnea on exertion 08/05/2017  . Hyperlipidemia 04/23/2016  . Arthritis 04/23/2016  . 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 Postoperative anemia due to acute blood loss 08/29/2015  . 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 Gait difficulty 12/29/2013  . Back  pain 12/29/2013  . Paresthesia 12/19/2012  . Essential hypertension 11/28/2012  . Polypharmacy 11/28/2012  . GERD (gastroesophageal reflux disease) 11/28/2012    Past Surgical History:  Procedure Laterality Date  . ABDOMINAL HYSTERECTOMY     04-02-18 Dr. Gerarda Fraction  . CYSTOSCOPY WITH RETROGRADE PYELOGRAM, URETEROSCOPY AND STENT PLACEMENT Bilateral 05/04/2018   Procedure: CYSTOSCOPY WITH RETROGRADE PYELOGRAM, URETEROSCOPY AND STENT PLACEMENT;  Surgeon: Cleon Gustin, MD;  Location: [facility];  Service: Urology;  Laterality: Bilateral;  . DILATION AND CURETTAGE, DIAGNOSTIC / THERAPEUTIC     1981 and 1998 due to missed AB  . ESOPHAGOGASTRODUODENOSCOPY ENDOSCOPY     with esophageal dilation  . FOOT SURGERY  2011   3rd metatarsal LT foot  . KNEE ARTHROSCOPY Left 02/08/2015   Procedure: LEFT KNEE ARTHROSCOPY ;  Surgeon: Latanya Maudlin, MD;  Location: [facility];  Service: Orthopedics;  Laterality: Left;  . LAPAROTOMY N/A 04/02/2018   Procedure: EXPLORATORY LAPAROTOMY;  Surgeon: Isabel Caprice, MD;  Location: [facility];  Service: Gynecology;  Laterality: N/A;  . SALPINGOOPHORECTOMY Bilateral 04/02/2018   Procedure: BILATERAL SALPINGO OOPHORECTOMY;  Surgeon: Isabel Caprice, MD;  Location: [facility];  Service: Gynecology;  Laterality: Bilateral;  . TONSILLECTOMY AND ADENOIDECTOMY    . TOTAL KNEE ARTHROPLASTY Left 08/22/2015   Procedure: LEFT TOTAL  KNEE  ARTHROPLASTY;  Surgeon: Latanya Maudlin, MD;  Location:  [facility];  Service: Orthopedics;  Laterality: Left;     OB History   No obstetric history on file.      Home Medications    Prior to Admission medications   Medication Sig Start Date End Date Taking? Authorizing Provider  acetaminophen (TYLENOL) 500 MG tablet Take 1,000 mg by mouth every 8 (eight) hours as needed for moderate pain.     [provider]  AMBULATORY NON FORMULARY MEDICATION Wheelchair Ramp  DX: G23.8 01/04/19   Tat, Eustace Quail, DO  atenolol (TENORMIN) 100  MG tablet Take 1 tablet (100 mg total) by mouth daily. 09/03/19   Maximiano Coss, NP  baclofen (LIORESAL) 10 MG tablet Take 10 mg by mouth. Take 1 tab every 8 hr    [provider]  carbidopa-levodopa (SINEMET CR) 50-200 MG tablet Take 1 tablet by mouth at bedtime. 08/13/19   Tat, Eustace Quail, DO  carbidopa-levodopa (SINEMET IR) 25-100 MG tablet One tablet po 6 times a day.Take one tablets as needed 08/19/19   Tat, Eustace Quail, DO  Carboxymethylcellul-Glycerin (LUBRICATING EYE DROPS OP) Place 1 drop into both eyes 3 (three) times daily as needed (dry eyes).    [provider]  clonazePAM (KLONOPIN) 0.5 MG tablet Take 1 tablet (0.5 mg total) by mouth at bedtime. 05/11/19   Tat, Eustace Quail, DO  cloNIDine (CATAPRES) 0.2 MG tablet Take 1 tablet by mouth every morning and take 2 tablets by mouth every evening. Needs appt for additional refills 09/03/19   Maximiano Coss, NP  DULoxetine (CYMBALTA) 30 MG capsule Take 1 capsule (30 mg total) by mouth daily. 09/02/19   Tat, Eustace Quail, DO  esomeprazole (NEXIUM) 20 MG capsule Take 40 mg by mouth 2 (two) times daily. 2 tab in morning and 2 tab at night    [provider]  hydrALAZINE (APRESOLINE) 50 MG tablet TAKE 1 TABLET BY MOUTH 4 TIMES DAILY AS NEEDED FOR ELEVATED BP AND TAKE ONE A NIGHT TIME 09/03/19   Maximiano Coss, NP  mirabegron ER (MYRBETRIQ) 50 MG TB24 tablet Take 50 mg by mouth daily.    [provider]  oxybutynin (DITROPAN) 5 MG tablet Take 5 mg by mouth 2 (two) times daily.  01/26/18   [provider]  senna-docusate (SENOKOT S) 8.6-50 MG tablet Take 2 tablets by mouth at bedtime. Patient taking differently: Take 2 tablets by mouth daily. 2 tab in morning and 2 at night 03/31/18   Shawnee Knapp, MD  telmisartan (MICARDIS) 80 MG tablet Take 1 tablet (80 mg total) by mouth daily. 09/03/19   Maximiano Coss, NP    Family History Family History  Problem Relation Age of Onset  . Diabetes Mother   . Kidney disease  Mother   . Melanoma Mother        arm  . COPD Father   . Brain cancer Brother   . Melanoma Daughter     Social History Social History   Tobacco Use  . Smoking status: Former Smoker    Packs/day: 1.00    Years: 12.00    Pack years: 12.00    Quit date: 03/14/1988    Years since quitting: 31.5  . Smokeless tobacco: Never Used  Substance Use Topics  . Alcohol use: No  . Drug use: No    Comment: edible ebrownies  months ago for pain     Allergies   Sulfa antibiotics   Review of Systems Review of Systems  Constitutional: Negative.   HENT: Negative.  Eyes: Negative.   Respiratory: Negative.   Cardiovascular: Negative.   Gastrointestinal: Negative.   Genitourinary: Negative.   Musculoskeletal: Negative for back pain and neck pain.  Skin: Positive for wound.  Allergic/Immunologic: Negative.   Neurological: Negative for dizziness and headaches.  Hematological: Negative.   Psychiatric/Behavioral: Negative.   All other systems reviewed and are negative.    Physical Exam Updated Vital Signs BP (!) 148/96 (BP Location: Right Arm)   Pulse 90   Temp 97.8 F (36.6 C) (Oral)   Resp 16   SpO2 97%   Physical Exam Vitals signs and nursing note reviewed.  Constitutional:      Appearance: Normal appearance.  HENT:     Head: Normocephalic.     Comments: 6 cm laceration occiput    Right Ear: External ear normal.     Left Ear: External ear normal.     Nose: Nose normal.  Eyes:     Pupils: Pupils are equal, round, and reactive to light.  Neck:     Musculoskeletal: Normal range of motion and neck supple. No muscular tenderness.  Cardiovascular:     Rate and Rhythm: Normal rate.  Pulmonary:     Effort: Pulmonary effort is normal.     Breath sounds: Normal breath sounds.  Abdominal:     General: Abdomen is flat.     Palpations: Abdomen is soft.  Musculoskeletal:     Comments: She has a decreased range of motion bilateral knees which is baseline for her.  There are  no contusions she has no new tenderness of the hips, pelvis, knees, or lower extremities.  Similarly the upper extremities show no signs of injury There is no tenderness over the cervical, thoracic, or lumbar spine.  Skin:    General: Skin is warm and dry.     Capillary Refill: Capillary refill takes less than 2 seconds.  Neurological:     General: No focal deficit present.     Mental Status: She is alert and oriented to person, place, and time.  Psychiatric:        Mood and Affect: Mood normal.      ED Treatments / Results  Labs (all labs ordered are listed, but only abnormal results are displayed) Labs Reviewed - No data to display  EKG None  Radiology No results found.  Procedures .Marland KitchenLaceration Repair  Date/Time: 09/23/2019 8:43 AM Performed by: Pattricia Boss, MD Authorized by: Pattricia Boss, MD   Consent:    Consent obtained:  Verbal   Consent given by:  Patient   Risks discussed:  Retained foreign body and poor wound healing   Alternatives discussed:  No treatment Anesthesia (see MAR for exact dosages):    Anesthesia method:  Local infiltration   Local anesthetic:  Lidocaine 1% WITH epi Laceration details:    Location:  Scalp   Scalp location:  Occipital   Length (cm):  6   Depth (mm):  3 Repair type:    Repair type:  Simple Pre-procedure details:    Preparation:  Patient was prepped and draped in usual sterile fashion Exploration:    Wound exploration: wound explored through full range of motion     Contaminated: no   Treatment:    Area cleansed with:  Saline   Amount of cleaning:  Standard   Irrigation solution:  Sterile water   Irrigation method:  Syringe   Visualized foreign bodies/material removed: no   Skin repair:    Repair method:  Staples   Number  of staples:  6 Post-procedure details:    Dressing:  Antibiotic ointment   (including critical care time)  Medications Ordered in ED Medications - No data to display   Initial Impression /  Assessment and Plan / ED Course  I have reviewed the triage vital signs and the nursing notes.  Pertinent labs & imaging results that were available during my care of the patient were reviewed by me and considered in my medical decision making (see chart for details).    63 year old female with neuromuscular disorder who had a mechanical fall this morning.  Her only injury appears to be to the occiput of her head with a laceration.  She reports her tetanus is up-to-date.  She reports no loss of consciousness or headache.  She is awake and alert does not appear to have any neurological deficit.  Wound was repaired with staples.  We discussed return precautions and removal of staples.  Staples should stay in for 7 days unless there are signs or symptoms of bleeding or infection.  Final Clinical Impressions(s) / ED Diagnoses   Final diagnoses:  Laceration of scalp, initial encounter  Fall, initial encounter    ED Discharge Orders    None       Pattricia Boss, MD 09/23/19 [phone]

## 2019-09-23 NOTE — ED Notes (Signed)
Dr Jeanell Sparrow in room for wound closure.

## 2019-09-30 ENCOUNTER — Ambulatory Visit (INDEPENDENT_AMBULATORY_CARE_PROVIDER_SITE_OTHER): Payer: Medicare Other | Admitting: Psychology

## 2019-09-30 DIAGNOSIS — F339 Major depressive disorder, recurrent, unspecified: Secondary | ICD-10-CM

## 2019-10-04 ENCOUNTER — Other Ambulatory Visit: Payer: Self-pay

## 2019-10-04 MED ORDER — CLONAZEPAM 0.5 MG PO TABS: 0.5000 mg | ORAL_TABLET | Freq: Every day | ORAL | 1 refills | Status: DC

## 2019-10-08 ENCOUNTER — Other Ambulatory Visit: Payer: Self-pay

## 2019-10-08 ENCOUNTER — Ambulatory Visit (INDEPENDENT_AMBULATORY_CARE_PROVIDER_SITE_OTHER): Payer: Medicare Other | Admitting: Neurology

## 2019-10-08 DIAGNOSIS — G5131 Clonic hemifacial spasm, right: Secondary | ICD-10-CM

## 2019-10-08 DIAGNOSIS — G238 Other specified degenerative diseases of basal ganglia: Secondary | ICD-10-CM

## 2019-10-08 MED ORDER — ONABOTULINUMTOXINA 100 UNITS IJ SOLR
15.0000 [IU] | Freq: Once | INTRAMUSCULAR | Status: AC
  Administered 2019-10-08: 15 [IU] via INTRAMUSCULAR

## 2019-10-08 NOTE — Procedures (Signed)
[facility]   History:  Diagnosis: Hemifacial spasm   Initial side: right   Result History  Pt reports that it helped.   Consent obtained from: The patient Benefits discussed included, but were not limited to decreased muscle tightness, increased joint range of motion, and decreased pain.  Risk discussed included, but were not limited pain and discomfort, bleeding, bruising, excessive weakness, venous thrombosis, muscle atrophy and dysphagia.  A copy of the patient medication guide was given to the patient which explains the blackbox warning.  Patients identity and treatment sites confirmed Yes.  .  Details of Procedure: Skin was cleaned with alcohol.  A 30 gauge, 1/2 inch needle was introduced to target mm.  Prior to injection, the needle plunger was aspirated to make sure the needle was not within a blood vessel.  There was no blood retrieved on aspiration.    Following is a summary of the muscles injected  And the amount of Botulinum toxin used:  Injections  Location Left  Right Units Number of sites        Corrugator      Frontalis      Lower Lid, Lateral  2.5 2.5   Lower Lid Medial      Upper Lid, Lateral  2.5 2.5   Upper Lid, Medial      Canthus  5.0 5.0   nasalis  2.5 2.5   Masseter      Procerus      Zygomaticus Major      TOTAL UNITS:   12.5    Agent: Botulinum Type A ( Onobotulinum Toxin type A ).  1 vials of Botox were used, each containing 50 units and freshly diluted with 2 mL of sterile, non-perserved saline   Total injected (Units): 12.5  Total wasted (Units):0 Pt tolerated procedure well without complications.   Reinjection is anticipated in 3 months.

## 2019-10-12 ENCOUNTER — Other Ambulatory Visit: Payer: Self-pay | Admitting: Family Medicine

## 2019-10-12 DIAGNOSIS — I10 Essential (primary) hypertension: Secondary | ICD-10-CM

## 2019-10-12 NOTE — Telephone Encounter (Signed)
Requested medication (s) are due for refill today: yes  Requested medication (s) are on the active medication list: yes  Last refill:  09/03/2019  Future visit scheduled: no  Notes to clinic:  no valid encounter in last 6 months    Requested Prescriptions  Pending Prescriptions Disp Refills   cloNIDine (CATAPRES) 0.2 MG tablet 90 tablet 0    Sig: Take 1 tablet by mouth every morning and take 2 tablets by mouth every evening. Needs appt for additional refills      Cardiovascular:  Alpha-2 Agonists Failed - 10/12/2019  3:18 PM      Failed - Last BP in normal range    BP Readings from Last 1 Encounters:  09/23/19 (!) 171/101          Passed - Last Heart Rate in normal range    Pulse Readings from Last 1 Encounters:  09/23/19 81          Passed - Valid encounter within last 6 months    Recent Outpatient Visits           1 month ago Essential hypertension   [facility], NP   4 months ago Bilateral wrist pain   [facility] at [facility], Arlie Solomons, MD   8 months ago Medicare annual wellness visit, subsequent   [facility] at [facility], Arlie Solomons, MD   1 year ago Malignant hypertension   [facility], Laurey Arrow, MD   1 year ago Malignant hypertension   [facility], Laurey Arrow, MD

## 2019-10-12 NOTE — Telephone Encounter (Signed)
Medication Refill - Medication: cloNIDine (CATAPRES) 0.2 MG tablet  BP has been 138/85 averaging   Preferred Pharmacy (with phone number or street name):  [facility], [place] Phone:  [phone]  Fax:  [phone]       Agent: Please be advised that RX refills may take up to 3 business days. We ask that you follow-up with your pharmacy.

## 2019-10-12 NOTE — Telephone Encounter (Signed)
Requested medication (s) are due for refill today: yes  Requested medication (s) are on the active medication list: yes  Last refill: 09/03/2019  Future visit scheduled: no  Notes to clinic:   BP has been 138/85 averaging  Requested Prescriptions  Pending Prescriptions Disp Refills   cloNIDine (CATAPRES) 0.2 MG tablet 90 tablet 0    Sig: Take 1 tablet by mouth every morning and take 2 tablets by mouth every evening. Needs appt for additional refills      Cardiovascular:  Alpha-2 Agonists Failed - 10/12/2019  3:18 PM      Failed - Last BP in normal range    BP Readings from Last 1 Encounters:  09/23/19 (!) 171/101          Passed - Last Heart Rate in normal range    Pulse Readings from Last 1 Encounters:  09/23/19 81          Passed - Valid encounter within last 6 months    Recent Outpatient Visits           1 month ago Essential hypertension   [facility], NP   4 months ago Bilateral wrist pain   [facility], Arlie Solomons, MD   8 months ago Medicare annual wellness visit, subsequent   [facility], Arlie Solomons, MD   1 year ago Malignant hypertension   [facility], Laurey Arrow, MD   1 year ago Malignant hypertension   [facility], Laurey Arrow, MD

## 2019-10-14 ENCOUNTER — Ambulatory Visit: Payer: Medicare Other | Admitting: Psychology

## 2019-10-15 ENCOUNTER — Ambulatory Visit (INDEPENDENT_AMBULATORY_CARE_PROVIDER_SITE_OTHER): Payer: Medicare Other | Admitting: Psychology

## 2019-10-15 DIAGNOSIS — F339 Major depressive disorder, recurrent, unspecified: Secondary | ICD-10-CM

## 2019-10-28 ENCOUNTER — Ambulatory Visit (INDEPENDENT_AMBULATORY_CARE_PROVIDER_SITE_OTHER): Payer: Medicare Other | Admitting: Psychology

## 2019-10-28 DIAGNOSIS — F339 Major depressive disorder, recurrent, unspecified: Secondary | ICD-10-CM | POA: Diagnosis not present

## 2019-11-03 ENCOUNTER — Other Ambulatory Visit: Payer: Self-pay

## 2019-11-03 MED ORDER — CARBIDOPA-LEVODOPA 25-100 MG PO TABS: ORAL_TABLET | ORAL | 0 refills | Status: DC

## 2019-11-03 MED ORDER — CARBIDOPA-LEVODOPA ER 50-200 MG PO TBCR: 1.0000 | EXTENDED_RELEASE_TABLET | Freq: Every day | ORAL | 0 refills | Status: DC

## 2019-11-04 ENCOUNTER — Other Ambulatory Visit: Payer: Self-pay | Admitting: Registered Nurse

## 2019-11-04 DIAGNOSIS — I10 Essential (primary) hypertension: Secondary | ICD-10-CM

## 2019-11-11 ENCOUNTER — Ambulatory Visit (INDEPENDENT_AMBULATORY_CARE_PROVIDER_SITE_OTHER): Payer: Medicare Other | Admitting: Psychology

## 2019-11-11 DIAGNOSIS — F339 Major depressive disorder, recurrent, unspecified: Secondary | ICD-10-CM

## 2019-11-25 ENCOUNTER — Ambulatory Visit (INDEPENDENT_AMBULATORY_CARE_PROVIDER_SITE_OTHER): Payer: Medicare Other | Admitting: Psychology

## 2019-11-25 DIAGNOSIS — F339 Major depressive disorder, recurrent, unspecified: Secondary | ICD-10-CM | POA: Diagnosis not present

## 2019-12-06 ENCOUNTER — Ambulatory Visit (INDEPENDENT_AMBULATORY_CARE_PROVIDER_SITE_OTHER): Payer: Medicare Other | Admitting: Psychology

## 2019-12-06 DIAGNOSIS — F339 Major depressive disorder, recurrent, unspecified: Secondary | ICD-10-CM

## 2019-12-09 ENCOUNTER — Ambulatory Visit: Payer: Medicare Other | Admitting: Psychology

## 2019-12-23 ENCOUNTER — Ambulatory Visit (INDEPENDENT_AMBULATORY_CARE_PROVIDER_SITE_OTHER): Payer: Medicare Other | Admitting: Psychology

## 2019-12-23 DIAGNOSIS — F339 Major depressive disorder, recurrent, unspecified: Secondary | ICD-10-CM | POA: Diagnosis not present

## 2019-12-29 ENCOUNTER — Encounter: Payer: Self-pay | Admitting: *Deleted

## 2019-12-29 NOTE — Progress Notes (Signed)
Submitted benefit verification to [employer] Benefit Verification [id number] Submitted! Please allow 24-48 hours for BV results. Waiting for response.   No PA required  PATIENT COVERAGE - MAJOR MEDICAL BENEFITS ICD Code(s) : G24.5    # of BOTOX Units: 20 [id number] BV Result PA/Pre-D Required  no BOTOX ([id number])Covered - Per Insurer Guidelines  Buy and Bill Covered - Per Insurer Guidelines  BOTOX [phone]) Specialty Pharmacy Covered - Per Insurer Guidelines   Fax sent to scan into her chart

## 2020-01-06 ENCOUNTER — Ambulatory Visit (INDEPENDENT_AMBULATORY_CARE_PROVIDER_SITE_OTHER): Payer: Medicare Other | Admitting: Psychology

## 2020-01-06 DIAGNOSIS — F339 Major depressive disorder, recurrent, unspecified: Secondary | ICD-10-CM

## 2020-01-07 ENCOUNTER — Ambulatory Visit (INDEPENDENT_AMBULATORY_CARE_PROVIDER_SITE_OTHER): Payer: Medicare Other | Admitting: Neurology

## 2020-01-07 ENCOUNTER — Other Ambulatory Visit: Payer: Self-pay

## 2020-01-07 DIAGNOSIS — G5131 Clonic hemifacial spasm, right: Secondary | ICD-10-CM

## 2020-01-07 MED ORDER — ONABOTULINUMTOXINA 100 UNITS IJ SOLR
15.0000 [IU] | Freq: Once | INTRAMUSCULAR | Status: AC
  Administered 2020-01-07: 15 [IU] via INTRAMUSCULAR

## 2020-01-07 NOTE — Procedures (Signed)
[facility]   History:  Diagnosis: Hemifacial spasm   Initial side: right   Result History  Pt reports that it helped.   Consent obtained from: The patient Benefits discussed included, but were not limited to decreased muscle tightness, increased joint range of motion, and decreased pain.  Risk discussed included, but were not limited pain and discomfort, bleeding, bruising, excessive weakness, venous thrombosis, muscle atrophy and dysphagia.  A copy of the patient medication guide was given to the patient which explains the blackbox warning.  Patients identity and treatment sites confirmed Yes.  .  Details of Procedure: Skin was cleaned with alcohol.  A 30 gauge, 1/2 inch needle was introduced to target mm.  Prior to injection, the needle plunger was aspirated to make sure the needle was not within a blood vessel.  There was no blood retrieved on aspiration.    Following is a summary of the muscles injected  And the amount of Botulinum toxin used:  Injections  Location Left  Right Units Number of sites        Corrugator      Frontalis      Lower Lid, Lateral  2.5 2.5   Lower Lid Medial      Upper Lid, Lateral  2.5 2.5   Upper Lid, Medial      Canthus 2.5 5.0 7.0   nasalis  2.5 2.5   Masseter      Procerus      Zygomaticus Major      TOTAL UNITS:   15    Agent: Botulinum Type A ( Onobotulinum Toxin type A ).  1 vials of Botox were used, each containing 50 units and freshly diluted with 2 mL of sterile, non-perserved saline   Total injected (Units): 15  Total wasted (Units):0 Pt tolerated procedure well without complications.   Reinjection is anticipated in 3 months. 

## 2020-01-20 ENCOUNTER — Ambulatory Visit (INDEPENDENT_AMBULATORY_CARE_PROVIDER_SITE_OTHER): Payer: Medicare Other | Admitting: Psychology

## 2020-01-20 DIAGNOSIS — F339 Major depressive disorder, recurrent, unspecified: Secondary | ICD-10-CM

## 2020-02-01 ENCOUNTER — Other Ambulatory Visit: Payer: Self-pay | Admitting: Registered Nurse

## 2020-02-01 ENCOUNTER — Other Ambulatory Visit: Payer: Self-pay | Admitting: Family Medicine

## 2020-02-01 ENCOUNTER — Other Ambulatory Visit: Payer: Self-pay | Admitting: Neurology

## 2020-02-01 ENCOUNTER — Telehealth: Payer: Self-pay

## 2020-02-01 DIAGNOSIS — I10 Essential (primary) hypertension: Secondary | ICD-10-CM

## 2020-02-01 DIAGNOSIS — Z1231 Encounter for screening mammogram for malignant neoplasm of breast: Secondary | ICD-10-CM

## 2020-02-01 NOTE — Telephone Encounter (Signed)
Pt requesting refill Atenolol  Patient is requesting a refill of the following medications: Requested Prescriptions    No prescriptions requested or ordered in this encounter  Atenolol 100 mg  Date of patient request: 02/01/2020 Last office visit: 01/25/2019 Date of last refill: 11/04/2019 Last refill amount: 90 tab  Does pt need new visit before she can have a refill?

## 2020-02-01 NOTE — Telephone Encounter (Signed)
Sent message to provider

## 2020-02-01 NOTE — Telephone Encounter (Signed)
Pt needs lab work before any other meds are given

## 2020-02-02 NOTE — Telephone Encounter (Signed)
Called pt [email or web address] to sch those labs

## 2020-02-03 ENCOUNTER — Ambulatory Visit (INDEPENDENT_AMBULATORY_CARE_PROVIDER_SITE_OTHER): Payer: Medicare Other | Admitting: Psychology

## 2020-02-03 DIAGNOSIS — F339 Major depressive disorder, recurrent, unspecified: Secondary | ICD-10-CM

## 2020-02-04 IMAGING — CT CT RENAL STONE PROTOCOL
2 of 4 series · 16 of 46 positions shown, 18 images · non-contrast
Comparison: CT scan of October 02, 2017 and MRI scan March 10, 2018.

CLINICAL DATA: Acute left-sided abdominal pain.

EXAM:
CT ABDOMEN AND PELVIS WITHOUT CONTRAST
TECHNIQUE: Multidetector CT imaging of the abdomen and pelvis was performed
following the standard protocol without IV contrast.

[Series 2: axial st · axial · 0.77mm/px · z∈[-459,-44]mm · 13 of 93 slices shown, 15 images]
[im 5/93  soft-tissue]
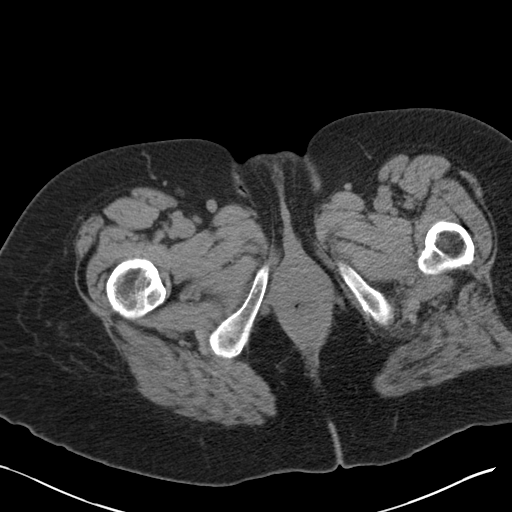
[im 5/93  bone]
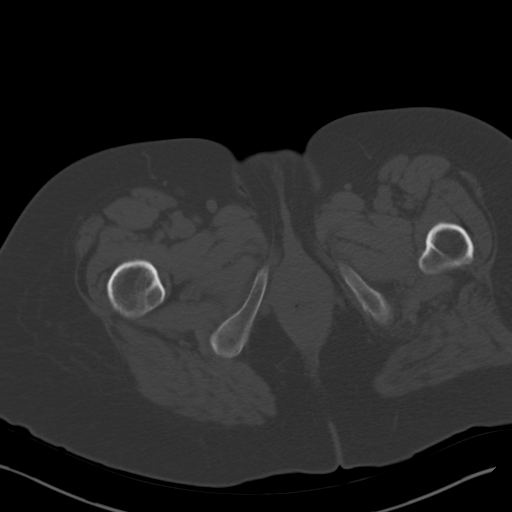
[im 14/93  soft-tissue]
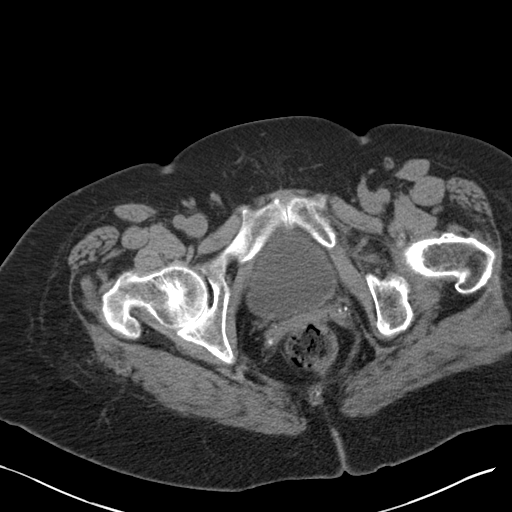
[im 19/93  soft-tissue]
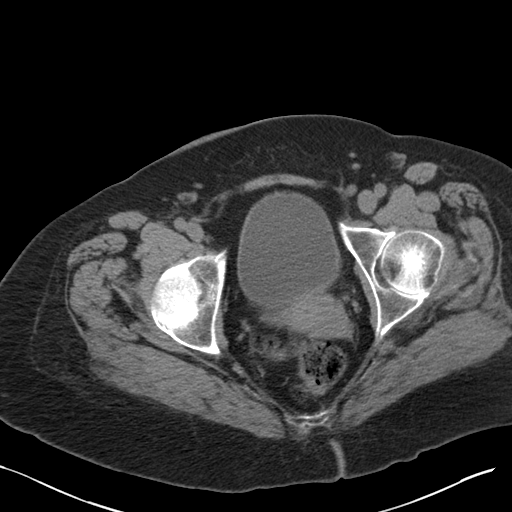
[im 28/93  soft-tissue]
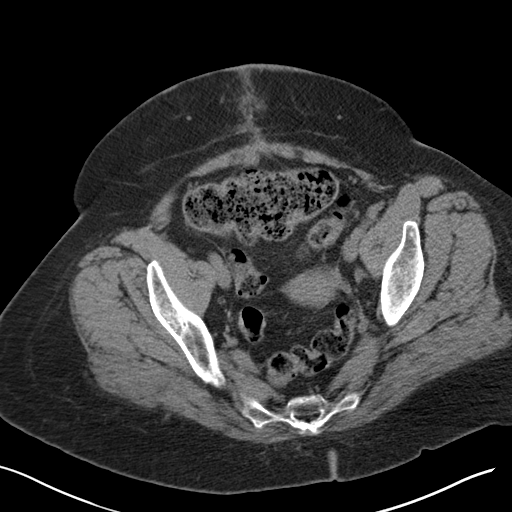
[im 33/93  soft-tissue]
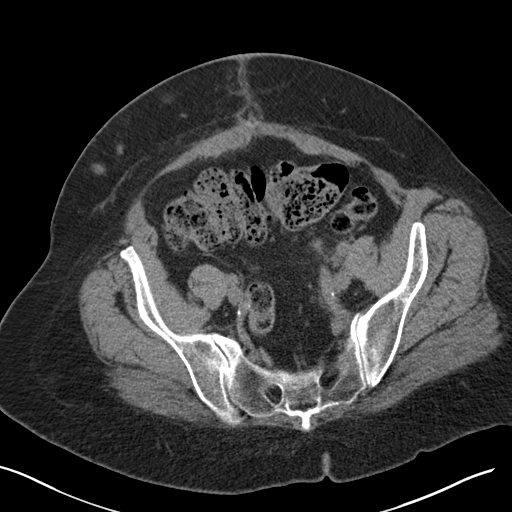
[im 42/93  soft-tissue]
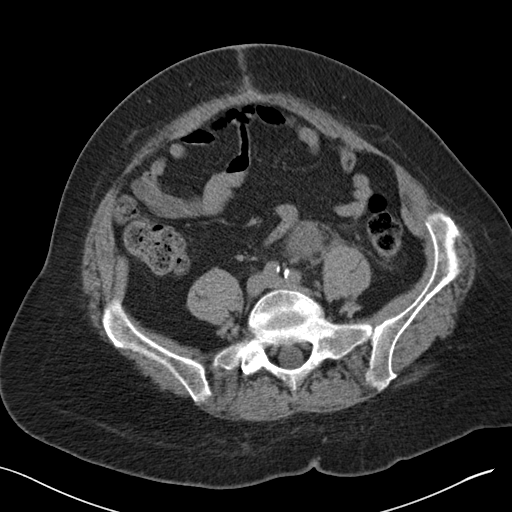
[im 47/93  soft-tissue]
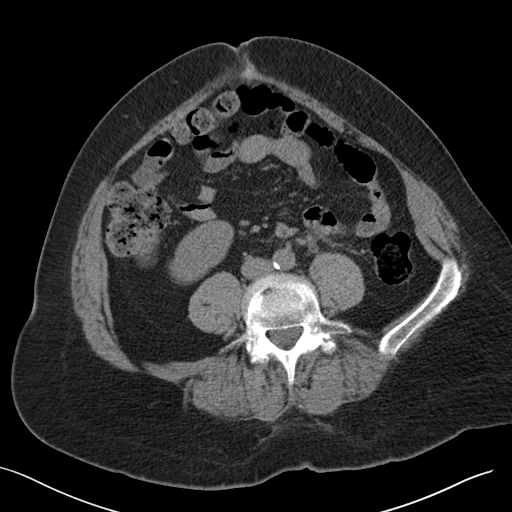
[im 51/93  soft-tissue]
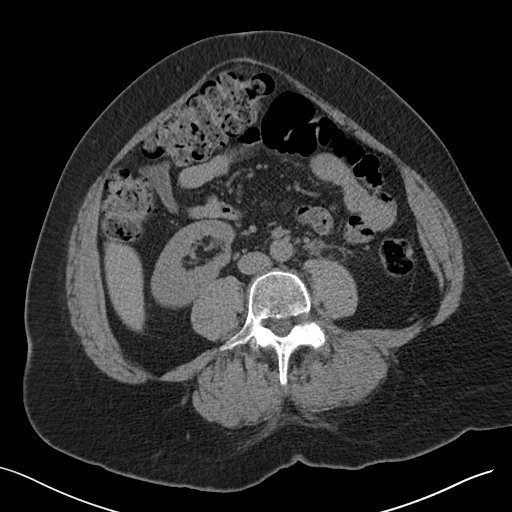
[im 60/93  soft-tissue]
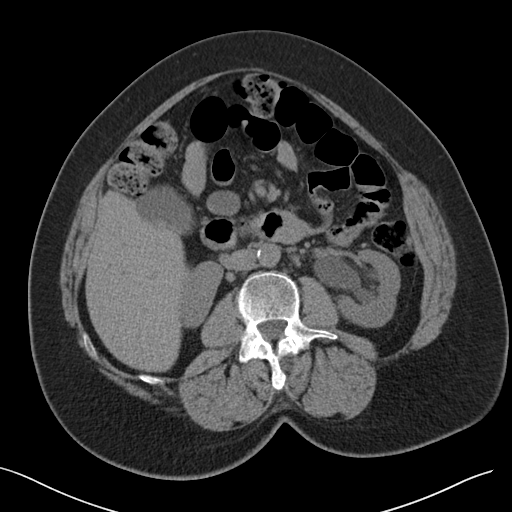
[im 60/93  bone]
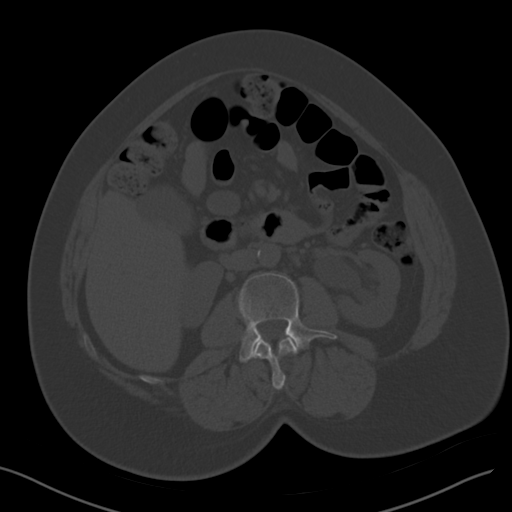
[im 65/93  soft-tissue]
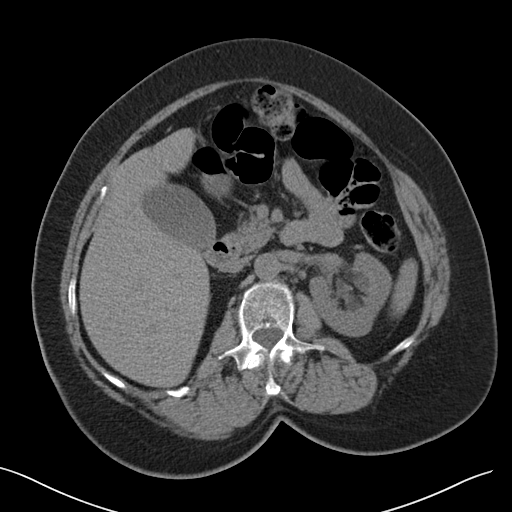
[im 74/93  soft-tissue]
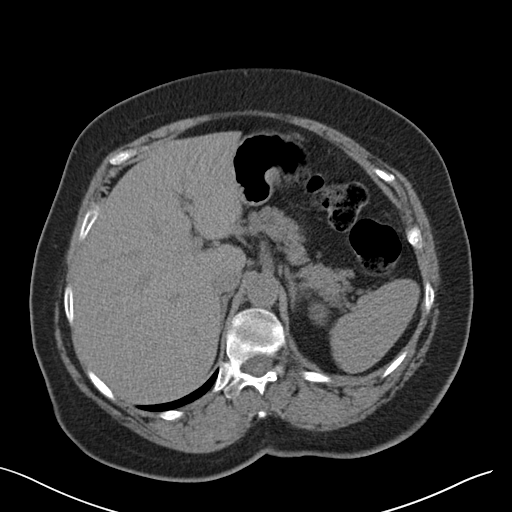
[im 79/93  soft-tissue]
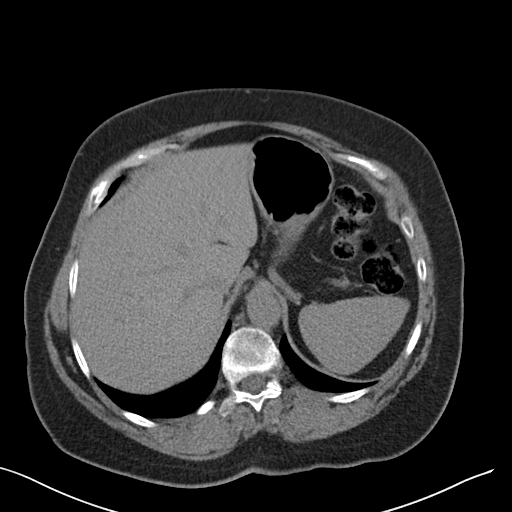
[im 88/93  soft-tissue]
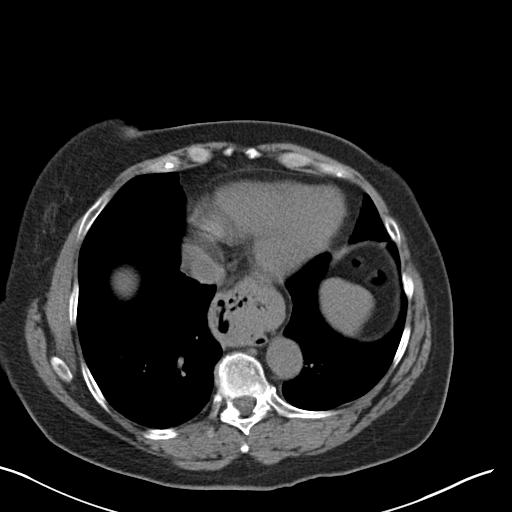

[Series 5: coronal · coronal · 0.78mm/px · 3 of 164 slices shown]
[im 55/164  soft-tissue]
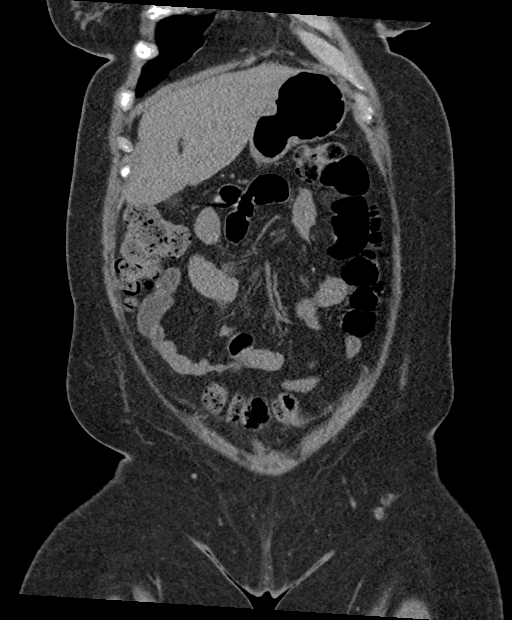
[im 73/164  soft-tissue]
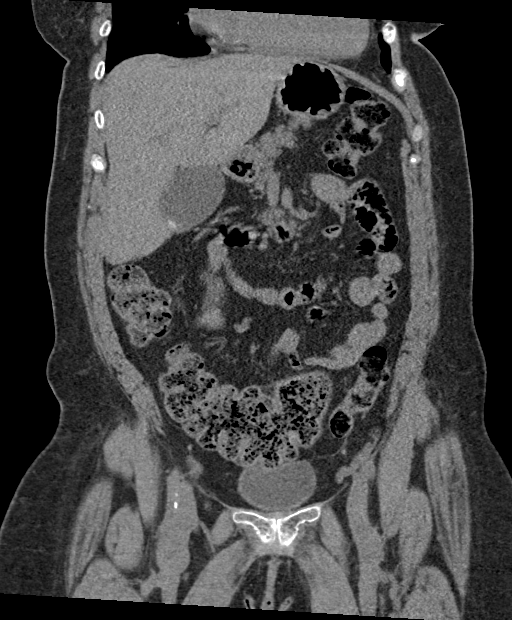
[im 91/164  soft-tissue]
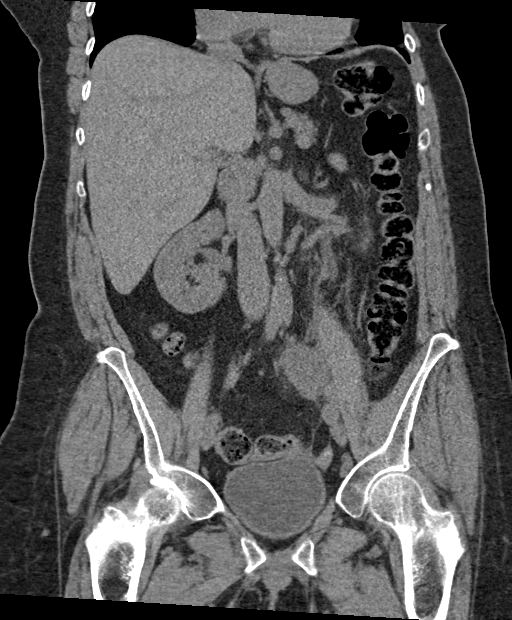

[16 of 46 positions shown; findings below may reference images not displayed]

FINDINGS: Lower chest: Large hiatal hernia is noted. Visualized lung bases are
unremarkable.

Hepatobiliary: Minimal cholelithiasis is noted without inflammation.
The liver is unremarkable on these unenhanced images. No biliary
dilatation is noted.

Pancreas: Unremarkable. No pancreatic ductal dilatation or
surrounding inflammatory changes.

Spleen: Normal in size without focal abnormality.

Adrenals/Urinary Tract: Adrenal glands appear normal. Right kidney
and ureter are unremarkable. Urinary bladder appears normal.
Moderate left hydroureteronephrosis is noted. No obstructing
calculus is noted. This may be due to possible external compression
by 5.3 x 3.6 cm left retroperitoneal mass or complex fluid
collection.

Stomach/Bowel: Stomach is within normal limits. Appendix appears
normal. No evidence of bowel wall thickening, distention, or
inflammatory changes.

Vascular/Lymphatic: Mild atherosclerotic calcifications of abdominal
aorta and iliac arteries are noted. As noted above, 5.3 x 3.6 cm
rounded left retroperitoneal density is noted concerning for
adenopathy or neoplasm, or possibly complex fluid collection.

Reproductive: Status post hysterectomy and bilateral oophorectomy.

Other: No abdominal wall hernia or abnormality. No abdominopelvic
ascites.

Musculoskeletal: No acute or significant osseous findings.
IMPRESSION: Status post recent hysterectomy and bilateral oophorectomy. There is
interval development of 5.3 x 3.6 cm left retroperitoneal density
which appears to be causing external compression of the left ureter,
resulting in moderate left hydronephrosis. This density is
concerning for possible metastatic adenopathy or neoplasm, or
possibly complex fluid collection. Given the history of recent
surgery, seroma cannot be excluded. Further evaluation with MRI with
and without gadolinium administration is recommended.

Large hiatal hernia.

Minimal cholelithiasis without inflammation.

## 2020-02-04 MED ORDER — ATENOLOL 100 MG PO TABS: 100.0000 mg | ORAL_TABLET | Freq: Every day | ORAL | 0 refills | Status: DC

## 2020-02-04 NOTE — Addendum Note (Signed)
Addended by: Delia Chimes A on: 02/04/2020 10:03 AM   Modules accepted: Orders

## 2020-02-04 NOTE — Progress Notes (Signed)
Assessment/Plan:   1.  MSA -Continue carbidopa/levodopa 25/100,, but I did tell her that she could take up to 8 to 10 tablets/day if she is having a particularly bad day with tremor (currently taking 8 tablets/day).  Did tell her that this would likely increase dyskinesia, however. -Continue carbidopa/levodopa 50/200 at bedtime             -Use Rollator at all times.  -mild dyskinesia in the legs.  No tx required, but we did talk about the fact that she has it.  -Discussed really that there is nothing that is going to help her freezing spells. 2. Hemifacial spasm, right -s/p botox which was beneficial.  Last injections on March 12, which has been very effective. 3. Dysphagia. -She had a MBE on November 19, 2017. This was unremarkable, with the exception of the tablet of barium lodged at the distal esophagus at one point. Mechanical soft diet with thin liquids as recommended. 4. Urinary incontinence -saw urology and on oxybutynin. Doing better on Myrbetriq, but having extreme dry mouth.  5. RBD -better with klonopin, 0.5 mg at night. 6.Depression, mild -Continue Cymbalta, 30 mg daily.   That has helped.    Discussed that we could always increase this in the future for neuropathic pain, but are starting other medications right now.  Discussed risk, benefits, side effects. Patient agreeable.             -She is doing counseling with Myra Gianotti.  7.  Leg and feet pain  -? Neuropathic.  Try gabapentin - 300 mg q hs.  If tolerated will increase to 300 mg bid after 2 weeks.  -refer to Dr. Letta Pate  8.  AM Headache  -prior nPSG with no OSAS.  Hopefully, the addition of nighttime gabapentin will help.   Subjective:   Jacqueline Atkinson was seen today in follow up for MSA.  Daughter on phone and supplements the history.  My previous records were reviewed prior to todays visit as well as  outside records available to me.  She was in the emergency room on November 26 after a fall in which she hit her head.  Staples were required in the occiput.  No neuroimaging was completed.  She has a component of hemifacial spasm.  Her last Botox was on March 12.  She tolerates that well.  She c/o pain - biggest pain is "my feet hurt all of the time."  States that there is a stabbing pain in the feet.  Feet feel like they are in concrete blocks and "they are hypersensitive."  She also c/o sudden, sharp, shooting pain in the legs, which is different than the cramping in the legs.  Also c/o LBP.  That doesn't travel down the leg.  It causes her to have to sit "right now."  Also waking up daily with headache.  Also c/o fatigue.  Asks about pain management clinic.    Current prescribed movement disorder medications: carbidopa/levodopa 25/100, 2 at 9am/1 at 11am/2 at 1pm/1 at 3pm/1 at 5pm/1 at 7pm Carbidopa/levodopa 50/200 at bedtime Clonazepam 0.5 mg at bedtime Cymbalta, 30 mg daily  PREVIOUS MEDICATIONS: carbidopa/levodopa 25/100 CR (didn't help changing to it);    ALLERGIES:   Allergies  Allergen Reactions  . Sulfa Antibiotics     Long time ago and does not remember reaction    CURRENT MEDICATIONS:  Outpatient Encounter Medications as of 02/08/2020  Medication Sig  . acetaminophen (TYLENOL) 500 MG tablet Take 1,000 mg  by mouth every 8 (eight) hours as needed for moderate pain.   Marland Kitchen AMBULATORY NON FORMULARY MEDICATION Wheelchair Ramp  DX: G23.8  . atenolol (TENORMIN) 100 MG tablet Take 1 tablet (100 mg total) by mouth daily.  . carbidopa-levodopa (SINEMET IR) 25-100 MG tablet Take 1 tablet by mouth 6 times daily. Take 1 extra tablet daily as needed.  . Carboxymethylcellul-Glycerin (LUBRICATING EYE DROPS OP) Place 1 drop into both eyes 3 (three) times daily as needed (dry eyes).  . clonazePAM (KLONOPIN) 0.5 MG tablet Take 1 tablet (0.5 mg total) by mouth at bedtime.  . cloNIDine (CATAPRES) 0.2  MG tablet Take 1 tablet by mouth every morning and take 2 tablets by mouth every evening. Needs appt for additional refills  . DULoxetine (CYMBALTA) 30 MG capsule Take 1 capsule (30 mg total) by mouth daily.  Marland Kitchen esomeprazole (NEXIUM) 20 MG capsule Take 40 mg by mouth 2 (two) times daily. 2 tab in morning and 2 tab at night  . hydrALAZINE (APRESOLINE) 50 MG tablet TAKE 1 TABLET BY MOUTH 4 TIMES DAILY AS NEEDED FOR ELEVATED BP AND TAKE ONE A NIGHT TIME  . mirabegron ER (MYRBETRIQ) 50 MG TB24 tablet Take 50 mg by mouth daily.  Marland Kitchen oxybutynin (DITROPAN) 5 MG tablet Take 5 mg by mouth 2 (two) times daily.   Marland Kitchen senna-docusate (SENOKOT S) 8.6-50 MG tablet Take 2 tablets by mouth at bedtime. (Patient taking differently: Take 2 tablets by mouth daily. 2 tab in morning and 2 at night)  . telmisartan (MICARDIS) 80 MG tablet Take 1 tablet (80 mg total) by mouth daily.  . carbidopa-levodopa (SINEMET CR) 50-200 MG tablet Take 1 tablet by mouth at bedtime.  . [DISCONTINUED] baclofen (LIORESAL) 10 MG tablet Take 10 mg by mouth. Take 1 tab every 8 hr   No facility-administered encounter medications on file as of 02/08/2020.    Objective:   PHYSICAL EXAMINATION:    VITALS:   Vitals:   02/08/20 1123  BP: 136/84  Pulse: 78  Resp: 18  SpO2: 98%  Weight: 225 lb (102.1 kg)  Height: 5\' 8"  (1.727 m)    GEN:  The patient appears stated age and is in NAD. HEENT:  Normocephalic, atraumatic.  The mucous membranes are moist. The superficial temporal arteries are without ropiness or tenderness. CV:  RRR Lungs:  CTAB Neck/HEME:  There are no carotid bruits bilaterally.  Neurological examination:  Orientation: The patient is alert and oriented x3. Cranial nerves: There is good facial symmetry with facial hypomimia. The speech is fluent and clear. Soft palate rises symmetrically and there is no tongue deviation. Hearing is intact to conversational tone. Sensation: Sensation is intact to light touch  throughout Motor: Strength is at least antigravity x4.  Movement examination: Tone: There is normal tone in the upper and lower extremities. Abnormal movements: Mild lower extremity dyskinesia. Coordination:  There is  decremation with RAM's, especially in the upper extremities on the right. Gait and Station: The patient pushes off of her Rollator to arise.  She is flexed at the waist.  She ambulates with her walker.  She is short stepped.   Total time spent on today's visit was 45 minutes, including both face-to-face time and nonface-to-face time.  Time included that spent on review of records (prior notes available to me/labs/imaging if pertinent), discussing treatment and goals, answering patient's questions and coordinating care.  Cc:  Forrest Moron, MD

## 2020-02-08 ENCOUNTER — Encounter: Payer: Self-pay | Admitting: Neurology

## 2020-02-08 ENCOUNTER — Other Ambulatory Visit: Payer: Self-pay

## 2020-02-08 ENCOUNTER — Ambulatory Visit: Payer: Medicare Other | Admitting: Neurology

## 2020-02-08 VITALS — BP 136/84 | HR 78 | Resp 18 | Ht 68.0 in | Wt 225.0 lb

## 2020-02-08 DIAGNOSIS — R202 Paresthesia of skin: Secondary | ICD-10-CM | POA: Diagnosis not present

## 2020-02-08 DIAGNOSIS — G238 Other specified degenerative diseases of basal ganglia: Secondary | ICD-10-CM | POA: Diagnosis not present

## 2020-02-08 DIAGNOSIS — G8929 Other chronic pain: Secondary | ICD-10-CM

## 2020-02-08 DIAGNOSIS — G5131 Clonic hemifacial spasm, right: Secondary | ICD-10-CM

## 2020-02-08 DIAGNOSIS — M545 Low back pain: Secondary | ICD-10-CM

## 2020-02-08 MED ORDER — GABAPENTIN 300 MG PO CAPS: 300.0000 mg | ORAL_CAPSULE | Freq: Two times a day (BID) | ORAL | 1 refills | Status: DC

## 2020-02-08 NOTE — Patient Instructions (Signed)
Start gabapentin 300 mg at bedtime for 2 weeks and then if tolerated, increase to 300 mg twice per day  I will send a referral to Dr. Letta Pate office.   Address: [phone] Phone: [phone]  On the days that you have tremor, you can take UP to 8-10 tablets carbidopa/levodopa 25/100 per day

## 2020-02-15 DIAGNOSIS — K2100 Gastro-esophageal reflux disease with esophagitis, without bleeding: Secondary | ICD-10-CM | POA: Diagnosis not present

## 2020-02-17 ENCOUNTER — Ambulatory Visit (INDEPENDENT_AMBULATORY_CARE_PROVIDER_SITE_OTHER): Payer: Medicare Other | Admitting: Psychology

## 2020-02-17 DIAGNOSIS — F339 Major depressive disorder, recurrent, unspecified: Secondary | ICD-10-CM

## 2020-02-18 ENCOUNTER — Encounter: Payer: Self-pay | Admitting: Registered Nurse

## 2020-02-18 ENCOUNTER — Other Ambulatory Visit: Payer: Self-pay

## 2020-02-18 ENCOUNTER — Ambulatory Visit (INDEPENDENT_AMBULATORY_CARE_PROVIDER_SITE_OTHER): Payer: Medicare Other | Admitting: Registered Nurse

## 2020-02-18 VITALS — BP 119/67 | HR 77 | Temp 98.0°F | Resp 16 | Ht 68.0 in | Wt 227.8 lb

## 2020-02-18 DIAGNOSIS — Z130 Encounter for screening for diseases of the blood and blood-forming organs and certain disorders involving the immune mechanism: Secondary | ICD-10-CM | POA: Diagnosis not present

## 2020-02-18 DIAGNOSIS — Z0000 Encounter for general adult medical examination without abnormal findings: Secondary | ICD-10-CM

## 2020-02-18 DIAGNOSIS — Z13228 Encounter for screening for other metabolic disorders: Secondary | ICD-10-CM | POA: Diagnosis not present

## 2020-02-18 DIAGNOSIS — I10 Essential (primary) hypertension: Secondary | ICD-10-CM

## 2020-02-18 DIAGNOSIS — Z13220 Encounter for screening for lipoid disorders: Secondary | ICD-10-CM

## 2020-02-18 DIAGNOSIS — Z1329 Encounter for screening for other suspected endocrine disorder: Secondary | ICD-10-CM | POA: Diagnosis not present

## 2020-02-18 NOTE — Patient Instructions (Signed)
° ° ° °  If you have lab work done today you will be contacted with your lab results within the next 2 weeks.  If you have not heard from us then please contact us. The fastest way to get your results is to register for My Chart. ° ° °IF you received an x-ray today, you will receive an invoice from [facility]. Please contact [facility] at [phone] with questions or concerns regarding your invoice.  ° °IF you received labwork today, you will receive an invoice from [facility]. Please contact [facility] at [phone] with questions or concerns regarding your invoice.  ° °Our billing staff will not be able to assist you with questions regarding bills from these companies. ° °You will be contacted with the lab results as soon as they are available. The fastest way to get your results is to activate your My Chart account. Instructions are located on the last page of this paperwork. If you have not heard from us regarding the results in 2 weeks, please contact this office. °  ° ° ° °

## 2020-02-19 ENCOUNTER — Encounter: Payer: Self-pay | Admitting: Registered Nurse

## 2020-02-19 LAB — COMPREHENSIVE METABOLIC PANEL
ALT: 7 IU/L (ref 0–32)
AST: 14 IU/L (ref 0–40)
Albumin/Globulin Ratio: 1.8 (ref 1.2–2.2)
Albumin: 4.4 g/dL (ref 3.8–4.8)
Alkaline Phosphatase: 123 IU/L — ABNORMAL HIGH (ref 39–117)
BUN/Creatinine Ratio: 14 (ref 12–28)
BUN: 15 mg/dL (ref 8–27)
Bilirubin Total: 0.3 mg/dL (ref 0.0–1.2)
CO2: 24 mmol/L (ref 20–29)
Calcium: 9.3 mg/dL (ref 8.7–10.3)
Chloride: 102 mmol/L (ref 96–106)
Creatinine, Ser: 1.09 mg/dL — ABNORMAL HIGH (ref 0.57–1.00)
GFR calc Af Amer: 62 mL/min/{1.73_m2} (ref >59)
GFR calc non Af Amer: 54 mL/min/{1.73_m2} — ABNORMAL LOW (ref >59)
Globulin, Total: 2.4 g/dL (ref 1.5–4.5)
Glucose: 107 mg/dL — ABNORMAL HIGH (ref 65–99)
Potassium: 4.7 mmol/L (ref 3.5–5.2)
Sodium: 141 mmol/L (ref 134–144)
Total Protein: 6.8 g/dL (ref 6.0–8.5)

## 2020-02-19 LAB — CBC WITH DIFFERENTIAL
Basophils Absolute: 0.1 10*3/uL (ref 0.0–0.2)
Basos: 1 %
EOS (ABSOLUTE): 0.3 10*3/uL (ref 0.0–0.4)
Eos: 4 %
Hematocrit: 39.8 % (ref 34.0–46.6)
Hemoglobin: 13.2 g/dL (ref 11.1–15.9)
Immature Grans (Abs): 0 10*3/uL (ref 0.0–0.1)
Immature Granulocytes: 0 %
Lymphocytes Absolute: 1.9 10*3/uL (ref 0.7–3.1)
Lymphs: 25 %
MCH: 28.5 pg (ref 26.6–33.0)
MCHC: 33.2 g/dL (ref 31.5–35.7)
MCV: 86 fL (ref 79–97)
Monocytes Absolute: 0.5 10*3/uL (ref 0.1–0.9)
Monocytes: 7 %
Neutrophils Absolute: 4.9 10*3/uL (ref 1.4–7.0)
Neutrophils: 63 %
RBC: 4.63 x10E6/uL (ref 3.77–5.28)
RDW: 14.2 % (ref 11.7–15.4)
WBC: 7.7 10*3/uL (ref 3.4–10.8)

## 2020-02-19 LAB — TSH: TSH: 0.533 u[IU]/mL (ref 0.450–4.500)

## 2020-02-19 LAB — HEMOGLOBIN A1C
Est. average glucose Bld gHb Est-mCnc: 117 mg/dL
Hgb A1c MFr Bld: 5.7 % — ABNORMAL HIGH (ref 4.8–5.6)

## 2020-02-19 LAB — LIPID PANEL
Chol/HDL Ratio: 5.8 ratio — ABNORMAL HIGH (ref 0.0–4.4)
Cholesterol, Total: 237 mg/dL — ABNORMAL HIGH (ref 100–199)
HDL: 41 mg/dL (ref >39)
LDL Chol Calc (NIH): 146 mg/dL — ABNORMAL HIGH (ref 0–99)
Triglycerides: 275 mg/dL — ABNORMAL HIGH (ref 0–149)
VLDL Cholesterol Cal: 50 mg/dL — ABNORMAL HIGH (ref 5–40)

## 2020-02-19 MED ORDER — CLONIDINE HCL 0.2 MG PO TABS: ORAL_TABLET | ORAL | 1 refills | Status: DC

## 2020-02-19 NOTE — Progress Notes (Signed)
Established Patient Office Visit  Subjective:  Patient ID: Jacqueline Atkinson, female    DOB: 07-Oct-1956  Age: 64 y.o. MRN: [id number]  CC:  Chief Complaint  Patient presents with  . Medication Refill    Klonopin per patient she has no other questions or concerns     HPI Jacqueline Atkinson presents for med refill - clonidine   Pt states home BP has been relatively stable - though with her MSA, has a history of bp lability. Denies chest pain, headaches, visual changes, shob, doe, claudication, and dependent edema beyond any baseline concerns  Follows with neurology Dr. Carles Collet regularly - no new concerns regarding MSA or other chronic conditions.  Past Medical History:  Diagnosis Date  . Anemia    hx of  . Anxiety   . Arthritis    Left knee  osteoarthritis  . GERD (gastroesophageal reflux disease)   . Heart murmur   . History of hiatal hernia    small  . Hx of seasonal allergies    occ. use OTC Mucinex or Antihistamine.  . Hyperlipidemia   . Hypertension   . Neuromuscular disorder ([place])    "multi system atrophy disease"- "pain, bilateral foot numbness, left knee pain"- Dr. Carles Collet is following.  . Ovarian cancer on left ([place]) 2019   serous borderline s/p BSO  . Pneumonia   . 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 ABDOMINAL HYSTERECTOMY     04-02-18 Dr. Gerarda Fraction  . CYSTOSCOPY WITH RETROGRADE PYELOGRAM, URETEROSCOPY AND STENT PLACEMENT Bilateral 05/04/2018   Procedure: CYSTOSCOPY WITH RETROGRADE PYELOGRAM, URETEROSCOPY AND STENT PLACEMENT;  Surgeon: Cleon Gustin, MD;  Location: [facility];  Service: Urology;  Laterality: Bilateral;  . DILATION AND CURETTAGE, DIAGNOSTIC / THERAPEUTIC     1981 and 1998 due to missed AB  . ESOPHAGOGASTRODUODENOSCOPY ENDOSCOPY     with esophageal dilation  . FOOT SURGERY  2011   3rd metatarsal LT foot  . KNEE  ARTHROSCOPY Left 02/08/2015   Procedure: LEFT KNEE ARTHROSCOPY ;  Surgeon: Latanya Maudlin, MD;  Location: [facility];  Service: Orthopedics;  Laterality: Left;  . LAPAROTOMY N/A 04/02/2018   Procedure: EXPLORATORY LAPAROTOMY;  Surgeon: Isabel Caprice, MD;  Location: [facility];  Service: Gynecology;  Laterality: N/A;  . SALPINGOOPHORECTOMY Bilateral 04/02/2018   Procedure: BILATERAL SALPINGO OOPHORECTOMY;  Surgeon: Isabel Caprice, MD;  Location: [facility];  Service: Gynecology;  Laterality: Bilateral;  . TONSILLECTOMY AND ADENOIDECTOMY    . TOTAL KNEE ARTHROPLASTY Left 08/22/2015   Procedure: LEFT TOTAL  KNEE  ARTHROPLASTY;  Surgeon: Latanya Maudlin, MD;  Location: [facility];  Service: Orthopedics;  Laterality: Left;    Family History  Problem Relation Age of Onset  . Diabetes Mother   . Kidney disease Mother   . Melanoma Mother        arm  . COPD Father   . Brain cancer Brother   . Melanoma Daughter     Social History   Socioeconomic History  . Marital status: Divorced    Spouse name: Not on file  . Number of children: 7  . Years of education: 72  . Highest education level: High school graduate  Occupational History  . Occupation: ACTIVITY COORDINATOR    Employer: [employer]  Tobacco Use  . Smoking status: Former Smoker    Packs/day: 1.00  Years: 12.00    Pack years: 12.00    Quit date: 03/14/1988    Years since quitting: 31.9  . Smokeless tobacco: Never Used  Substance and Sexual Activity  . Alcohol use: No  . Drug use: No    Comment: edible ebrownies  months ago for pain  . Sexual activity: Not Currently  Other Topics Concern  . Not on file  Social History Narrative   Right handed   One story home   Drinks caffeine on occasionally   Social Determinants of Health   Financial Resource Strain:   . Difficulty of Paying Living Expenses:   Food Insecurity:   . Worried About Charity fundraiser in the Last Year:   . Arboriculturist in the Last Year:    Transportation Needs:   . Film/video editor (Medical):   Marland Kitchen Lack of Transportation (Non-Medical):   Physical Activity:   . Days of Exercise per Week:   . Minutes of Exercise per Session:   Stress:   . Feeling of Stress :   Social Connections:   . Frequency of Communication with Friends and Family:   . Frequency of Social Gatherings with Friends and Family:   . Attends Religious Services:   . Active Member of Clubs or Organizations:   . Attends Archivist Meetings:   Marland Kitchen Marital Status:   Intimate Partner Violence:   . Fear of Current or Ex-Partner:   . Emotionally Abused:   Marland Kitchen Physically Abused:   . Sexually Abused:     Outpatient Medications Prior to Visit  Medication Sig Dispense Refill  . acetaminophen (TYLENOL) 500 MG tablet Take 1,000 mg by mouth every 8 (eight) hours as needed for moderate pain.     Marland Kitchen AMBULATORY NON FORMULARY MEDICATION Wheelchair Ramp  DX: G23.8 1 Device 0  . atenolol (TENORMIN) 100 MG tablet Take 1 tablet (100 mg total) by mouth daily. 90 tablet 0  . carbidopa-levodopa (SINEMET CR) 50-200 MG tablet Take 1 tablet by mouth at bedtime. 90 tablet 0  . carbidopa-levodopa (SINEMET IR) 25-100 MG tablet Take 1 tablet by mouth 6 times daily. Take 1 extra tablet daily as needed. 630 tablet 0  . Carboxymethylcellul-Glycerin (LUBRICATING EYE DROPS OP) Place 1 drop into both eyes 3 (three) times daily as needed (dry eyes).    . clonazePAM (KLONOPIN) 0.5 MG tablet Take 1 tablet (0.5 mg total) by mouth at bedtime. 90 tablet 1  . DULoxetine (CYMBALTA) 30 MG capsule Take 1 capsule (30 mg total) by mouth daily. 90 capsule 1  . esomeprazole (NEXIUM) 20 MG capsule Take 40 mg by mouth 2 (two) times daily. 2 tab in morning and 2 tab at night    . gabapentin (NEURONTIN) 300 MG capsule Take 1 capsule (300 mg total) by mouth 2 (two) times daily. 180 capsule 1  . hydrALAZINE (APRESOLINE) 50 MG tablet TAKE 1 TABLET BY MOUTH 4 TIMES DAILY AS NEEDED FOR ELEVATED BP AND  TAKE ONE A NIGHT TIME 360 tablet 0  . mirabegron ER (MYRBETRIQ) 50 MG TB24 tablet Take 50 mg by mouth daily.    Marland Kitchen oxybutynin (DITROPAN) 5 MG tablet Take 5 mg by mouth 2 (two) times daily.   1  . senna-docusate (SENOKOT S) 8.6-50 MG tablet Take 2 tablets by mouth at bedtime. (Patient taking differently: Take 2 tablets by mouth daily. 2 tab in morning and 2 at night)    . telmisartan (MICARDIS) 80 MG tablet Take 1 tablet (  80 mg total) by mouth daily. 90 tablet 3  . cloNIDine (CATAPRES) 0.2 MG tablet Take 1 tablet by mouth every morning and take 2 tablets by mouth every evening. Needs appt for additional refills 90 tablet 0   No facility-administered medications prior to visit.    Allergies  Allergen Reactions  . Sulfa Antibiotics     Long time ago and does not remember reaction    ROS Review of Systems  Constitutional: Negative.   HENT: Negative.   Eyes: Negative.   Respiratory: Negative.   Cardiovascular: Negative.   Gastrointestinal: Negative.   Endocrine: Negative.   Genitourinary: Negative.   Musculoskeletal: Negative.   Skin: Negative.   Allergic/Immunologic: Negative.   Neurological: Negative.   Hematological: Negative.   Psychiatric/Behavioral: Negative.   All other systems reviewed and are negative.     Objective:    Physical Exam  Constitutional: She is oriented to person, place, and time. She appears well-developed and well-nourished. No distress.  Cardiovascular: Normal rate and regular rhythm.  Pulmonary/Chest: Effort normal. No respiratory distress.  Neurological: She is alert and oriented to person, place, and time.  Skin: Skin is warm and dry. No rash noted. She is not diaphoretic. No erythema. No pallor.  Psychiatric: She has a normal mood and affect. Her behavior is normal. Judgment and thought content normal.  Nursing note and vitals reviewed.   BP 119/67   Pulse 77   Temp 98 F (36.7 C) (Temporal)   Resp 16   Ht 5\' 8"  (1.727 m)   Wt 227 lb 12.8  oz (103.3 kg)   SpO2 95%   BMI 34.64 kg/m  Wt Readings from Last 3 Encounters:  02/18/20 227 lb 12.8 oz (103.3 kg)  02/08/20 225 lb (102.1 kg)  09/03/19 235 lb (106.6 kg)     Health Maintenance Due  Topic Date Due  . Hepatitis C Screening  Never done  . MAMMOGRAM  06/30/2011    There are no preventive care reminders to display for this patient.  Lab Results  Component Value Date   TSH 0.533 02/18/2020   Lab Results  Component Value Date   WBC 7.7 02/18/2020   HGB 13.2 02/18/2020   HCT 39.8 02/18/2020   MCV 86 02/18/2020   PLT 187 05/06/2018   Lab Results  Component Value Date   NA 141 02/18/2020   K 4.7 02/18/2020   CO2 24 02/18/2020   GLUCOSE 107 (H) 02/18/2020   BUN 15 02/18/2020   CREATININE 1.09 (H) 02/18/2020   BILITOT 0.3 02/18/2020   ALKPHOS 123 (H) 02/18/2020   AST 14 02/18/2020   ALT 7 02/18/2020   PROT 6.8 02/18/2020   ALBUMIN 4.4 02/18/2020   CALCIUM 9.3 02/18/2020   ANIONGAP 11 05/06/2018   Lab Results  Component Value Date   CHOL 237 (H) 02/18/2020   Lab Results  Component Value Date   HDL 41 02/18/2020   Lab Results  Component Value Date   LDLCALC 146 (H) 02/18/2020   Lab Results  Component Value Date   TRIG 275 (H) 02/18/2020   Lab Results  Component Value Date   CHOLHDL 5.8 (H) 02/18/2020   Lab Results  Component Value Date   HGBA1C 5.7 (H) 02/18/2020      Assessment & Plan:   Problem List Items Addressed This Visit      Cardiovascular and Mediastinum   Essential hypertension   Relevant Medications   cloNIDine (CATAPRES) 0.2 MG tablet    Other Visit  Diagnoses    Routine general medical examination at a health care facility    -  Primary   Screening for endocrine, metabolic and immunity disorder       Relevant Orders   CBC With Differential (Completed)   Comprehensive metabolic panel (Completed)   TSH (Completed)   Hemoglobin A1c (Completed)   Lipid screening       Relevant Orders   Lipid panel (Completed)       Meds ordered this encounter  Medications  . cloNIDine (CATAPRES) 0.2 MG tablet    Sig: Take 1 tablet by mouth every morning and take 2 tablets by mouth every evening. Needs appt for additional refills    Dispense:  180 tablet    Refill:  1    Order Specific Question:   Supervising Provider    Answer:   Forrest Moron [id number]    Follow-up: No follow-ups on file.   PLAN  Refill clonidine  Re-establish with new provider at our office given her PCP, Dr. Nolon Rod, will be leaving our office  Labs collected, will follow up as warranted  Patient encouraged to call clinic with any questions, comments, or concerns.  Maximiano Coss, NP

## 2020-02-24 ENCOUNTER — Telehealth: Payer: Self-pay | Admitting: Family Medicine

## 2020-02-24 DIAGNOSIS — I10 Essential (primary) hypertension: Secondary | ICD-10-CM

## 2020-02-24 MED ORDER — CLONIDINE HCL 0.2 MG PO TABS: ORAL_TABLET | ORAL | 1 refills | Status: DC

## 2020-02-24 NOTE — Addendum Note (Signed)
Addended by: Denman George on: 02/24/2020 02:25 PM   Modules accepted: Orders

## 2020-02-24 NOTE — Telephone Encounter (Signed)
Call placed to [facility], [place] to make aware of the Rx for Clonidine being sent to them in error.  Spoke with [employer].  Stated she will cancel the Rx through [facility].

## 2020-02-24 NOTE — Telephone Encounter (Signed)
Pt stated rx for cloNIDine (CATAPRES) 0.2 MG tablet  Needs to go to  [place], [place] Phone:  [phone]  Fax:  [phone]     It went to the wrong one pt stated. Please advise.

## 2020-02-24 NOTE — Telephone Encounter (Signed)
Per pt. request, will redirect Rx for Clonidine 0.2 mg. To [facility] in [place], [place].

## 2020-02-25 ENCOUNTER — Encounter: Payer: Medicare Other | Attending: Physical Medicine & Rehabilitation | Admitting: Physical Medicine & Rehabilitation

## 2020-02-25 ENCOUNTER — Encounter: Payer: Self-pay | Admitting: Physical Medicine & Rehabilitation

## 2020-02-25 ENCOUNTER — Other Ambulatory Visit: Payer: Self-pay

## 2020-02-25 VITALS — BP 123/86 | HR 67 | Temp 97.5°F | Ht 68.0 in | Wt 226.0 lb

## 2020-02-25 DIAGNOSIS — G238 Other specified degenerative diseases of basal ganglia: Secondary | ICD-10-CM

## 2020-02-25 MED ORDER — DULOXETINE HCL 60 MG PO CPEP: 60.0000 mg | ORAL_CAPSULE | Freq: Every day | ORAL | 1 refills | Status: DC

## 2020-02-25 NOTE — Progress Notes (Signed)
Subjective:    Patient ID: Jacqueline Atkinson, female    DOB: 1956-05-20, 64 y.o.   MRN: [id number] Cc: Low back and foot pain HPI  64 year old female multiple system atrophy kindly referred by her neurologist Dr. Carles Collet. Feet are constantly numb and hypersensitive.  Onset symptoms ~2 yrs ago.  No hx of diabetes.  No history of thyroid disease.  She is recently started gabapentin prescribed by her neurologist and this is helpful for sleep but she cannot tolerate it during the day.  She is also on Cymbalta 30 mg a day which she does not feel any negative effect from at this point. The patient feels a little weaker on the right side but her burning pain in the feet is equal on both sides  No recent Physical therapy but does some HEP (inconsistently) Last fall with injury was on [employer], "minor falls" every couple months.   Left ovarian cyst removal May 2019 Back pain ongoing for > 43yrs, back pain worse with standing (standing tolerance 65min) , walking feels better than standing.  Sitting tolerance 2 hours. MRI lumbar spine from 09/17/2017 obtained from [facility] did not show any significant disc bulging no evidence of spinal stenosis.  There was evidence of lumbar spondylosis at the L4-5 and L5-S1 levels. Pain Inventory Average Pain 6 Pain Right Now 5 My pain is sharp, burning, dull, tingling and aching  In the last 24 hours, has pain interfered with the following? General activity 2 Relation with others 1 Enjoyment of life 2 What TIME of day is your pain at its worst? all, varies Sleep (in general) Fair  Pain is worse with: inactivity, standing and some activites Pain improves with: ? Relief from Meds: ?  Mobility walk with assistance use a walker ability to climb steps?  yes do you drive?  no  Function disabled: date disabled . I need assistance with the following:  meal prep, household duties and shopping  Neuro/Psych bladder control problems bowel control  problems weakness numbness tremor tingling trouble walking spasms dizziness depression loss of taste or smell  Prior Studies new  Physicians involved in your care new   Family History  Problem Relation Age of Onset  . Diabetes Mother   . Kidney disease Mother   . Melanoma Mother        arm  . COPD Father   . Brain cancer Brother   . Melanoma Daughter    Social History   Socioeconomic History  . Marital status: Divorced    Spouse name: Not on file  . Number of children: 7  . Years of education: 55  . Highest education level: High school graduate  Occupational History  . Occupation: ACTIVITY COORDINATOR    Employer: [employer]  Tobacco Use  . Smoking status: Former Smoker    Packs/day: 1.00    Years: 12.00    Pack years: 12.00    Quit date: 03/14/1988    Years since quitting: 31.9  . Smokeless tobacco: Never Used  Substance and Sexual Activity  . Alcohol use: No  . Drug use: No    Comment: edible ebrownies  months ago for pain  . Sexual activity: Not Currently  Other Topics Concern  . Not on file  Social History Narrative   Right handed   One story home   Drinks caffeine on occasionally   Social Determinants of Health   Financial Resource Strain:   . Difficulty of Paying Living Expenses:  Food Insecurity:   . Worried About Charity fundraiser in the Last Year:   . Arboriculturist in the Last Year:   Transportation Needs:   . Film/video editor (Medical):   Marland Kitchen Lack of Transportation (Non-Medical):   Physical Activity:   . Days of Exercise per Week:   . Minutes of Exercise per Session:   Stress:   . Feeling of Stress :   Social Connections:   . Frequency of Communication with Friends and Family:   . Frequency of Social Gatherings with Friends and Family:   . Attends Religious Services:   . Active Member of Clubs or Organizations:   . Attends Archivist Meetings:   Marland Kitchen Marital Status:    Past Surgical History:    Procedure Laterality Date  . ABDOMINAL HYSTERECTOMY     04-02-18 Dr. Gerarda Fraction  . CYSTOSCOPY WITH RETROGRADE PYELOGRAM, URETEROSCOPY AND STENT PLACEMENT Bilateral 05/04/2018   Procedure: CYSTOSCOPY WITH RETROGRADE PYELOGRAM, URETEROSCOPY AND STENT PLACEMENT;  Surgeon: Cleon Gustin, MD;  Location: [facility];  Service: Urology;  Laterality: Bilateral;  . DILATION AND CURETTAGE, DIAGNOSTIC / THERAPEUTIC     1981 and 1998 due to missed AB  . ESOPHAGOGASTRODUODENOSCOPY ENDOSCOPY     with esophageal dilation  . FOOT SURGERY  2011   3rd metatarsal LT foot  . KNEE ARTHROSCOPY Left 02/08/2015   Procedure: LEFT KNEE ARTHROSCOPY ;  Surgeon: Latanya Maudlin, MD;  Location[facility];  Service: Orthopedics;  Laterality: Left;  . LAPAROTOMY N/A 04/02/2018   Procedure: EXPLORATORY LAPAROTOMY;  Surgeon: Isabel Caprice, MD;  Location[facility];  Service: Gynecology;  Laterality: N/A;  . SALPINGOOPHORECTOMY Bilateral 04/02/2018   Procedure: BILATERAL SALPINGO OOPHORECTOMY;  Surgeon: Isabel Caprice, MD;  Location[facility];  Service: Gynecology;  Laterality: Bilateral;  . TONSILLECTOMY AND ADENOIDECTOMY    . TOTAL KNEE ARTHROPLASTY Left 08/22/2015   Procedure: LEFT TOTAL  KNEE  ARTHROPLASTY;  Surgeon: Latanya Maudlin, MD;  Location[facility];  Service: Orthopedics;  Laterality: Left;   Past Medical History:  Diagnosis Date  . Anemia    hx of  . Anxiety   . Arthritis    Left knee  osteoarthritis  . GERD (gastroesophageal reflux disease)   . Heart murmur   . History of hiatal hernia    small  . Hx of seasonal allergies    occ. use OTC Mucinex or Antihistamine.  . Hyperlipidemia   . Hypertension   . Neuromuscular disorder ([place])    "multi system atrophy disease"- "pain, bilateral foot numbness, left knee pain"- Dr. Carles Collet is following.  . Ovarian cancer on left ([place]) 2019   serous borderline s/p BSO  . Pneumonia   . Shoulder disorder    right shoulder "rigidity due to Multi system atrophy"-  ROM not limited"just tires me"   BP 123/86   Pulse 67   Temp (!) 97.5 F (36.4 C)   Ht 5\' 8"  (1.727 m)   Wt 226 lb (102.5 kg)   SpO2 95%   BMI 34.36 kg/m   Opioid Risk Score:   Fall Risk Score:  `1  Depression screen PHQ 2/9  Depression screen [facility] 2/9 02/18/2020 09/03/2019 06/09/2019 01/25/2019 09/28/2018 03/13/2018 08/22/2017  Decreased Interest 0 0 0 0 0 0 0  Down, Depressed, Hopeless 0 0 0 0 0 0 0  PHQ - 2 Score 0 0 0 0 0 0 0  Some recent data might be hidden  Review of Systems  Constitutional: Negative.   HENT: Negative.   Eyes: Negative.   Respiratory: Negative.   Cardiovascular: Positive for leg swelling.  Gastrointestinal: Negative.  Negative for constipation.  Endocrine: Negative.   Genitourinary: Positive for difficulty urinating.  Musculoskeletal: Positive for arthralgias, back pain, gait problem and neck pain.  Skin: Negative.   Allergic/Immunologic: Negative.   Neurological: Positive for tremors, weakness and numbness.       Tingling  Psychiatric/Behavioral: Positive for dysphoric mood.       Objective:   Physical Exam Vitals and nursing note reviewed.  Constitutional:      Appearance: Normal appearance.  Eyes:     General: No scleral icterus.       Right eye: No discharge.        Left eye: No discharge.     Extraocular Movements: Extraocular movements intact.     Conjunctiva/sclera: Conjunctivae normal.     Pupils: Pupils are equal, round, and reactive to light.  Cardiovascular:     Rate and Rhythm: Normal rate and regular rhythm.     Heart sounds: Normal heart sounds. No murmur.  Pulmonary:     Effort: Pulmonary effort is normal. No respiratory distress.     Breath sounds: Normal breath sounds. No stridor.  Abdominal:     General: Abdomen is flat. Bowel sounds are normal. There is no distension.     Palpations: Abdomen is soft. There is no mass.  Musculoskeletal:     Cervical back: Normal range of motion.  Neurological:     General: No focal  deficit present.     Mental Status: She is alert and oriented to person, place, and time.     Comments: Motor strength is 4/5 in the right deltoid bicep tricep grip 5/5 in the left deltoid by stress of grip 4/5 right hip flexor knee extensor and 4 - at the right ankle dorsiflexor 5/5 left hip flexor knee extensor dorsiflexor. Sensation intact to pinprick bilateral upper limbs There is decreased sensation in the feet bilaterally but intact at the ankle.  Right foot has less sensation the left. There is no hypersensitivity to touch both feet Tone no evidence of spasticity Resting tremor cerebellar shows mild dysmetria bilateral finger-nose-finger   Gait is using a walker forward flexed posture shuffling steps  Musculoskeletal lumbar spinous tenderness palpation lumbosacral junction.  There are some tenderness over the PSIS area as well.  Lumbar spine range of motion limited to approximately 50% flexion she can get to neutral on extension but no extension past neutral.  Neutral extension is painful.  Right and left lateral bending is mildly painful and reduced by 50%        Assessment & Plan:  #1.  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  The patient has evidence of microstriatal involvement with parkinsonian features, autonomic neuropathy primarily manifested as burning pain in the with small fiber neuropathy, also cortical spinal findings mainly on the right side with hemiparesis.  Will make referral to outpatient PT and OT  Recommend continue gabapentin 300 mg nightly for neuropathic pain and increase duloxetine to 60 mg/day   2.  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  Radiologic evidence of facet joint arthropathy L4-5 S1 levels.  Will ask PT to address this as well if no significant improvement over the next 6 weeks would consider lumbar medial branch blocks under fluoroscopic guidance.

## 2020-02-25 NOTE — Patient Instructions (Signed)
Lumbar pain likely from arthritis- PT eval  Burning pain related to MSA- autonomic neuropathy with small fiber involvement- increase Cymbalta to 60mg  , cont gabapentin at night   Poor coordination R hand , OT evaluation

## 2020-03-02 ENCOUNTER — Ambulatory Visit (INDEPENDENT_AMBULATORY_CARE_PROVIDER_SITE_OTHER): Payer: Medicare Other | Admitting: Psychology

## 2020-03-02 ENCOUNTER — Other Ambulatory Visit: Payer: Self-pay | Admitting: Neurology

## 2020-03-02 DIAGNOSIS — F339 Major depressive disorder, recurrent, unspecified: Secondary | ICD-10-CM | POA: Diagnosis not present

## 2020-03-16 ENCOUNTER — Ambulatory Visit (INDEPENDENT_AMBULATORY_CARE_PROVIDER_SITE_OTHER): Payer: Medicare Other | Admitting: Psychology

## 2020-03-16 DIAGNOSIS — F339 Major depressive disorder, recurrent, unspecified: Secondary | ICD-10-CM

## 2020-03-24 DIAGNOSIS — N3281 Overactive bladder: Secondary | ICD-10-CM | POA: Diagnosis not present

## 2020-03-24 DIAGNOSIS — N131 Hydronephrosis with ureteral stricture, not elsewhere classified: Secondary | ICD-10-CM | POA: Diagnosis not present

## 2020-03-30 ENCOUNTER — Other Ambulatory Visit: Payer: Self-pay | Admitting: Urology

## 2020-03-30 ENCOUNTER — Ambulatory Visit (INDEPENDENT_AMBULATORY_CARE_PROVIDER_SITE_OTHER): Payer: Medicare Other | Admitting: Psychology

## 2020-03-30 DIAGNOSIS — F339 Major depressive disorder, recurrent, unspecified: Secondary | ICD-10-CM

## 2020-04-07 ENCOUNTER — Encounter: Payer: Self-pay | Admitting: Physical Medicine & Rehabilitation

## 2020-04-07 ENCOUNTER — Other Ambulatory Visit: Payer: Self-pay

## 2020-04-07 ENCOUNTER — Ambulatory Visit (INDEPENDENT_AMBULATORY_CARE_PROVIDER_SITE_OTHER): Payer: Medicare Other | Admitting: Neurology

## 2020-04-07 ENCOUNTER — Encounter: Payer: Medicare Other | Attending: Physical Medicine & Rehabilitation | Admitting: Physical Medicine & Rehabilitation

## 2020-04-07 VITALS — BP 131/84 | HR 96 | Temp 97.5°F | Ht 68.0 in | Wt 233.4 lb

## 2020-04-07 DIAGNOSIS — M47817 Spondylosis without myelopathy or radiculopathy, lumbosacral region: Secondary | ICD-10-CM | POA: Diagnosis not present

## 2020-04-07 DIAGNOSIS — G5131 Clonic hemifacial spasm, right: Secondary | ICD-10-CM

## 2020-04-07 DIAGNOSIS — G238 Other specified degenerative diseases of basal ganglia: Secondary | ICD-10-CM | POA: Insufficient documentation

## 2020-04-07 MED ORDER — ONABOTULINUMTOXINA 100 UNITS IJ SOLR
15.0000 [IU] | Freq: Once | INTRAMUSCULAR | Status: AC
  Administered 2020-04-07: 15 [IU] via INTRAMUSCULAR

## 2020-04-07 NOTE — Progress Notes (Signed)
Subjective:    Patient ID: Jacqueline Atkinson, female    DOB: 11/10/1955, 64 y.o.   MRN: [id number]  HPI 64 year old female with multisystem atrophy with primary complaints of low back pain.  She was having lower extremity pain probably from peripheral neuropathy associated with multisystem atrophy.  Lower extremity pain has improved with increased dose of gabapentin. Low back pain is exacerbated by standing or walking.  The patient does ambulate with a walker. Social has had some rest associated with recent attempted suicide by her son who has substance abuse issues Pain Inventory Average Pain 6 Pain Right Now 5 My pain is tingling and aching  In the last 24 hours, has pain interfered with the following? General activity 6 Relation with others 4 Enjoyment of life 6 What TIME of day is your pain at its worst? varies Sleep (in general) Fair  Pain is worse with: standing and some activites Pain improves with: rest Relief from Meds: 3  Mobility walk with assistance use a walker ability to climb steps?  yes do you drive?  no  Function disabled: date disabled . I need assistance with the following:  meal prep, household duties and shopping  Neuro/Psych bladder control problems bowel control problems weakness tremor trouble walking depression loss of taste or smell  Prior Studies Any changes since last visit?  no  Physicians involved in your care Any changes since last visit?  no   Family History  Problem Relation Age of Onset  . Diabetes Mother   . Kidney disease Mother   . Melanoma Mother        arm  . COPD Father   . Brain cancer Brother   . Melanoma Daughter    Social History   Socioeconomic History  . Marital status: Divorced    Spouse name: Not on file  . Number of children: 7  . Years of education: 18  . Highest education level: High school graduate  Occupational History  . Occupation: ACTIVITY COORDINATOR    Employer: [employer]    Tobacco Use  . Smoking status: Former Smoker    Packs/day: 1.00    Years: 12.00    Pack years: 12.00    Quit date: 03/14/1988    Years since quitting: 32.0  . Smokeless tobacco: Never Used  Vaping Use  . Vaping Use: Never used  Substance and Sexual Activity  . Alcohol use: No  . Drug use: No    Comment: edible ebrownies  months ago for pain  . Sexual activity: Not Currently  Other Topics Concern  . Not on file  Social History Narrative   Right handed   One story home   Drinks caffeine on occasionally   Social Determinants of Health   Financial Resource Strain:   . Difficulty of Paying Living Expenses:   Food Insecurity:   . Worried About Charity fundraiser in the Last Year:   . Arboriculturist in the Last Year:   Transportation Needs:   . Film/video editor (Medical):   Marland Kitchen Lack of Transportation (Non-Medical):   Physical Activity:   . Days of Exercise per Week:   . Minutes of Exercise per Session:   Stress:   . Feeling of Stress :   Social Connections:   . Frequency of Communication with Friends and Family:   . Frequency of Social Gatherings with Friends and Family:   . Attends Religious Services:   . Active Member of Clubs  or Organizations:   . Attends Archivist Meetings:   Marland Kitchen Marital Status:    Past Surgical History:  Procedure Laterality Date  . ABDOMINAL HYSTERECTOMY     04-02-18 Dr. Gerarda Fraction  . CYSTOSCOPY WITH RETROGRADE PYELOGRAM, URETEROSCOPY AND STENT PLACEMENT Bilateral 05/04/2018   Procedure: CYSTOSCOPY WITH RETROGRADE PYELOGRAM, URETEROSCOPY AND STENT PLACEMENT;  Surgeon: Cleon Gustin, MD;  Location: [facility];  Service: Urology;  Laterality: Bilateral;  . DILATION AND CURETTAGE, DIAGNOSTIC / THERAPEUTIC     1981 and 1998 due to missed AB  . ESOPHAGOGASTRODUODENOSCOPY ENDOSCOPY     with esophageal dilation  . FOOT SURGERY  2011   3rd metatarsal LT foot  . KNEE ARTHROSCOPY Left 02/08/2015   Procedure: LEFT KNEE  ARTHROSCOPY ;  Surgeon: Latanya Maudlin, MD;  Location: [facility];  Service: Orthopedics;  Laterality: Left;  . LAPAROTOMY N/A 04/02/2018   Procedure: EXPLORATORY LAPAROTOMY;  Surgeon: Isabel Caprice, MD;  Location: [facility];  Service: Gynecology;  Laterality: N/A;  . SALPINGOOPHORECTOMY Bilateral 04/02/2018   Procedure: BILATERAL SALPINGO OOPHORECTOMY;  Surgeon: Isabel Caprice, MD;  Location: [facility];  Service: Gynecology;  Laterality: Bilateral;  . TONSILLECTOMY AND ADENOIDECTOMY    . TOTAL KNEE ARTHROPLASTY Left 08/22/2015   Procedure: LEFT TOTAL  KNEE  ARTHROPLASTY;  Surgeon: Latanya Maudlin, MD;  Location: [facility];  Service: Orthopedics;  Laterality: Left;   Past Medical History:  Diagnosis Date  . Anemia    hx of  . Anxiety   . Arthritis    Left knee  osteoarthritis  . GERD (gastroesophageal reflux disease)   . Heart murmur   . History of hiatal hernia    small  . Hx of seasonal allergies    occ. use OTC Mucinex or Antihistamine.  . Hyperlipidemia   . Hypertension   . Neuromuscular disorder ([place])    "multi system atrophy disease"- "pain, bilateral foot numbness, left knee pain"- Dr. Carles Collet is following.  . Ovarian cancer on left ([place]) 2019   serous borderline s/p BSO  . Pneumonia   . Shoulder disorder    right shoulder "rigidity due to Multi system atrophy"- ROM not limited"just tires me"   BP 131/84   Pulse 96   Temp (!) 97.5 F (36.4 C)   Ht 5\' 8"  (1.727 m)   Wt 233 lb 6.4 oz (105.9 kg)   SpO2 97%   BMI 35.49 kg/m   Opioid Risk Score:   Fall Risk Score:  `1  Depression screen PHQ 2/9  Depression screen [facility] 2/9 04/07/2020 02/25/2020 02/18/2020 09/03/2019 06/09/2019 01/25/2019 09/28/2018  Decreased Interest 1 1 0 0 0 0 0  Down, Depressed, Hopeless 1 1 0 0 0 0 0  PHQ - 2 Score 2 2 0 0 0 0 0  Altered sleeping - 2 - - - - -  Tired, decreased energy - 3 - - - - -  Change in appetite - 1 - - - - -  Feeling bad or failure about yourself  - 1 - - - - -  Trouble concentrating - 1 -  - - - -  Moving slowly or fidgety/restless - 2 - - - - -  Suicidal thoughts - 0 - - - - -  PHQ-9 Score - 12 - - - - -  Some recent data might be hidden   Review of Systems  Constitutional: Negative.   HENT: Negative.   Eyes: Negative.   Respiratory: Negative.   Cardiovascular: Negative.  Gastrointestinal: Positive for constipation.  Endocrine: Negative.   Genitourinary:       Bladder control  Musculoskeletal: Positive for gait problem.  Skin: Negative.   Allergic/Immunologic: Negative.   Neurological: Positive for tremors and weakness.  Hematological: Negative.   Psychiatric/Behavioral: Positive for dysphoric mood.  All other systems reviewed and are negative.      Objective:   Physical Exam Vitals and nursing note reviewed.  Constitutional:      Appearance: She is obese.  HENT:     Head: Normocephalic and atraumatic.  Eyes:     Extraocular Movements: Extraocular movements intact.     Conjunctiva/sclera: Conjunctivae normal.     Pupils: Pupils are equal, round, and reactive to light.  Musculoskeletal:     Right lower leg: No edema.     Left lower leg: No edema.     Comments: Tenderness palpation of the lumbar paraspinals lower lumbar area Pain increases with extension improves with sitting. Negative straight leg raise testing bilaterally  Skin:    General: Skin is warm and dry.  Neurological:     Mental Status: She is alert and oriented to person, place, and time.  Psychiatric:        Mood and Affect: Mood normal.        Behavior: Behavior normal.           Assessment & Plan:  #1.  Chronic low back pain with imaging studies as well as examination consistent with lumbar spondylosis without myelopathy.  We will set up for lumbar medial branch blocks L3-L4 as well as L5 dorsal ramus injection under fluoroscopic guidance.  We discussed oral medication management however she is on multiple medications which are already causing sedation.  Therefore we will not add  another.  She could use as needed Tylenol

## 2020-04-07 NOTE — Procedures (Signed)
[facility]   History:  Diagnosis: Hemifacial spasm   Initial side: right   Result History  Pt reports that it helped.   Consent obtained from: The patient Benefits discussed included, but were not limited to decreased muscle tightness, increased joint range of motion, and decreased pain.  Risk discussed included, but were not limited pain and discomfort, bleeding, bruising, excessive weakness, venous thrombosis, muscle atrophy and dysphagia.  A copy of the patient medication guide was given to the patient which explains the blackbox warning.  Patients identity and treatment sites confirmed Yes.  .  Details of Procedure: Skin was cleaned with alcohol.  A 30 gauge, 1/2 inch needle was introduced to target mm.  Prior to injection, the needle plunger was aspirated to make sure the needle was not within a blood vessel.  There was no blood retrieved on aspiration.    Following is a summary of the muscles injected  And the amount of Botulinum toxin used:  Injections  Location Left  Right Units Number of sites        Corrugator      Frontalis      Lower Lid, Lateral  2.5 2.5   Lower Lid Medial      Upper Lid, Lateral  2.5 2.5   Upper Lid, Medial      Canthus 2.5 5.0 7.0   nasalis  2.5 2.5   Masseter      Procerus      Zygomaticus Major      TOTAL UNITS:   15    Agent: Botulinum Type A ( Onobotulinum Toxin type A ).  1 vials of Botox were used, each containing 50 units and freshly diluted with 2 mL of sterile, non-perserved saline   Total injected (Units): 15  Total wasted (Units):0 Pt tolerated procedure well without complications.   Reinjection is anticipated in 3 months. 

## 2020-04-07 NOTE — Patient Instructions (Signed)
Medial branch blocks to help with back pain

## 2020-04-11 ENCOUNTER — Other Ambulatory Visit: Payer: Self-pay | Admitting: Family Medicine

## 2020-04-11 DIAGNOSIS — I10 Essential (primary) hypertension: Secondary | ICD-10-CM

## 2020-04-11 MED ORDER — ATENOLOL 100 MG PO TABS: 100.0000 mg | ORAL_TABLET | Freq: Every day | ORAL | 0 refills | Status: DC

## 2020-04-11 MED ORDER — CLONIDINE HCL 0.2 MG PO TABS: ORAL_TABLET | ORAL | 1 refills | Status: DC

## 2020-04-11 NOTE — Telephone Encounter (Signed)
Medication Refill - Medication:  atenolol (TENORMIN) 100 MG tablet [[id number]]  cloNIDine (CATAPRES) 0.2 MG tablet [[id number]  Preferred Pharmacy (with phone number or street name):  [facility] [address] [place] [address]  Phone: [phone] Fax: [phone]     Agent: Please be advised that RX refills may take up to 3 business days. We ask that you follow-up with your pharmacy.

## 2020-04-11 NOTE — Telephone Encounter (Signed)
Requested medication (s) are due for refill today - change in pharmacy  Requested medication (s) are on the active medication list -yes  Future visit scheduled -no  Last refill: 2 months-02/04/20  Notes to clinic: Patient former [facility] patient- last Rx states needs appointment- has seen [facility]  Requested Prescriptions  Pending Prescriptions Disp Refills   atenolol (TENORMIN) 100 MG tablet 90 tablet 0    Sig: Take 1 tablet (100 mg total) by mouth daily.      Cardiovascular:  Beta Blockers Passed - 04/11/2020 12:13 PM      Passed - Last BP in normal range    BP Readings from Last 1 Encounters:  04/07/20 131/84          Passed - Last Heart Rate in normal range    Pulse Readings from Last 1 Encounters:  04/07/20 96          Passed - Valid encounter within last 6 months    Recent Outpatient Visits           1 month ago Routine general medical examination at a health care facility   [facility], Delfino Lovett, NP   7 months ago Essential hypertension   [facility], Delfino Lovett, NP   10 months ago Bilateral wrist pain   [facility] at [facility], Arlie Solomons, MD   1 year ago Medicare annual wellness visit, subsequent   [facility] at [facility] [place] [facility], MD   1 year ago Malignant hypertension   [facility], Laurey Arrow, MD                cloNIDine (CATAPRES) 0.2 MG tablet 180 tablet 1    Sig: Take 1 tablet by mouth every morning and take 2 tablets by mouth every evening. Needs appt for additional refills      Cardiovascular:  Alpha-2 Agonists Passed - 04/11/2020 12:13 PM      Passed - Last BP in normal range    BP Readings from Last 1 Encounters:  04/07/20 131/84          Passed - Last Heart Rate in normal range    Pulse Readings from Last 1 Encounters:  04/07/20 96          Passed - Valid encounter within last 6 months    Recent Outpatient Visits           1 month ago Routine general medical examination at a  health care facility   [facility], Delfino Lovett, NP   7 months ago Essential hypertension   [facility], Delfino Lovett, NP   10 months ago Bilateral wrist pain   [facility] at [facility], Arlie Solomons, MD   1 year ago Medicare annual wellness visit, subsequent   Primary Care at [facility], Arlie Solomons, MD   1 year ago Malignant hypertension   [facility], Laurey Arrow, MD                  Requested Prescriptions  Pending Prescriptions Disp Refills   atenolol (TENORMIN) 100 MG tablet 90 tablet 0    Sig: Take 1 tablet (100 mg total) by mouth daily.      Cardiovascular:  Beta Blockers Passed - 04/11/2020 12:13 PM      Passed - Last BP in normal range    BP Readings from Last 1 Encounters:  04/07/20 131/84  Passed - Last Heart Rate in normal range    Pulse Readings from Last 1 Encounters:  04/07/20 96          Passed - Valid encounter within last 6 months    Recent Outpatient Visits           1 month ago Routine general medical examination at a health care facility   [facility], Delfino Lovett, NP   7 months ago Essential hypertension   [facility], Delfino Lovett, NP   10 months ago Bilateral wrist pain   Primary Care at [facility], Arlie Solomons, MD   1 year ago Medicare annual wellness visit, subsequent   Primary Care at [facility] [place] A, MD   1 year ago Malignant hypertension   [facility], Laurey Arrow, MD                cloNIDine (CATAPRES) 0.2 MG tablet 180 tablet 1    Sig: Take 1 tablet by mouth every morning and take 2 tablets by mouth every evening. Needs appt for additional refills      Cardiovascular:  Alpha-2 Agonists Passed - 04/11/2020 12:13 PM      Passed - Last BP in normal range    BP Readings from Last 1 Encounters:  04/07/20 131/84          Passed - Last Heart Rate in normal range    Pulse Readings from Last 1 Encounters:  04/07/20 96           Passed - Valid encounter within last 6 months    Recent Outpatient Visits           1 month ago Routine general medical examination at a health care facility   [facility], Delfino Lovett, NP   7 months ago Essential hypertension   [facility], Delfino Lovett, NP   10 months ago Bilateral wrist pain   Primary Care at [facility], Arlie Solomons, MD   1 year ago Medicare annual wellness visit, subsequent   Primary Care at [facility], Arlie Solomons, MD   1 year ago Malignant hypertension   [facility], Laurey Arrow, MD

## 2020-04-13 ENCOUNTER — Ambulatory Visit (INDEPENDENT_AMBULATORY_CARE_PROVIDER_SITE_OTHER): Payer: Medicare Other | Admitting: Psychology

## 2020-04-13 DIAGNOSIS — F339 Major depressive disorder, recurrent, unspecified: Secondary | ICD-10-CM | POA: Diagnosis not present

## 2020-04-20 ENCOUNTER — Ambulatory Visit (INDEPENDENT_AMBULATORY_CARE_PROVIDER_SITE_OTHER): Payer: Medicare Other | Admitting: Psychology

## 2020-04-20 DIAGNOSIS — F339 Major depressive disorder, recurrent, unspecified: Secondary | ICD-10-CM

## 2020-04-27 ENCOUNTER — Ambulatory Visit (INDEPENDENT_AMBULATORY_CARE_PROVIDER_SITE_OTHER): Payer: Medicare Other | Admitting: Psychology

## 2020-04-27 DIAGNOSIS — F339 Major depressive disorder, recurrent, unspecified: Secondary | ICD-10-CM

## 2020-05-01 ENCOUNTER — Other Ambulatory Visit: Payer: Self-pay | Admitting: Neurology

## 2020-05-02 NOTE — Telephone Encounter (Signed)
Rx(s) sent to pharmacy electronically.  

## 2020-05-04 ENCOUNTER — Ambulatory Visit (INDEPENDENT_AMBULATORY_CARE_PROVIDER_SITE_OTHER): Payer: Medicare Other | Admitting: Psychology

## 2020-05-04 ENCOUNTER — Ambulatory Visit: Payer: Medicare Other | Admitting: Physical Medicine & Rehabilitation

## 2020-05-04 DIAGNOSIS — F339 Major depressive disorder, recurrent, unspecified: Secondary | ICD-10-CM

## 2020-05-05 ENCOUNTER — Encounter: Payer: Self-pay | Admitting: Physical Medicine & Rehabilitation

## 2020-05-05 ENCOUNTER — Other Ambulatory Visit: Payer: Self-pay

## 2020-05-05 ENCOUNTER — Encounter: Payer: Medicare Other | Attending: Physical Medicine & Rehabilitation | Admitting: Physical Medicine & Rehabilitation

## 2020-05-05 VITALS — BP 167/103 | HR 92 | Temp 98.4°F | Ht 68.0 in | Wt 236.0 lb

## 2020-05-05 DIAGNOSIS — M47817 Spondylosis without myelopathy or radiculopathy, lumbosacral region: Secondary | ICD-10-CM | POA: Diagnosis not present

## 2020-05-05 DIAGNOSIS — G8929 Other chronic pain: Secondary | ICD-10-CM | POA: Diagnosis not present

## 2020-05-05 DIAGNOSIS — M545 Low back pain: Secondary | ICD-10-CM | POA: Diagnosis not present

## 2020-05-05 DIAGNOSIS — M47816 Spondylosis without myelopathy or radiculopathy, lumbar region: Secondary | ICD-10-CM | POA: Diagnosis not present

## 2020-05-05 NOTE — Progress Notes (Signed)
°  PROCEDURE RECORD [facility]   Name: [name] DOB:1956-09-30 MRN: [id number]  Date:05/05/2020  Physician: Alysia Penna, MD    Nurse/CMA: Jaiden Wahab, CMA  Allergies:  Allergies  Allergen Reactions   Sulfa Antibiotics     Long time ago and does not remember reaction    Consent Signed: Yes.    Is patient diabetic? No.  CBG today?   Pregnant: No. LMP: No LMP recorded. Patient has had a hysterectomy. (age 64-55)  Anticoagulants: no Anti-inflammatory: no Antibiotics: no  Procedure: bilateral L3,4,5 medial branch block Position: Prone Start Time: 12:03 pm  End Time:12:20pm   Fluoro Time: 56s  RN/CMA Shahzain Kiester, CMA Oriel Rumbold, CMA    Time 11:50pm 12:10am    BP 167/103 166/113    Pulse 91 89    Respirations 14 14    O2 Sat 98 98    S/S 6 6    Pain Level 6/10 2/10     D/C home with son patient A & O X 3, D/C instructions reviewed, and sits independently.

## 2020-05-05 NOTE — Patient Instructions (Signed)

## 2020-05-05 NOTE — Progress Notes (Signed)

## 2020-05-08 ENCOUNTER — Telehealth: Payer: Self-pay | Admitting: *Deleted

## 2020-05-08 NOTE — Telephone Encounter (Signed)
Mrs Tranchina called and reports that she is having intense pain progressively worse since her back injection Friday 05/05/20.  It was definitely a 9 last pm but is 7-8 today. At this point she does not think she is going to want another injection.  I called her back and spoke with her about her pain.  It is some better today. She has been using heating pad and I suggested she try alternating with ice pack. We reviewed what to look for with inflammation at the site and fever which she reports she has neither symptom.  If would continue to worsen she is to seek evaluation in the ED or urgent care.  She will decide if she wants to pursue the next injection over the next week.

## 2020-05-11 ENCOUNTER — Ambulatory Visit (INDEPENDENT_AMBULATORY_CARE_PROVIDER_SITE_OTHER): Payer: Medicare Other | Admitting: Psychology

## 2020-05-11 DIAGNOSIS — F339 Major depressive disorder, recurrent, unspecified: Secondary | ICD-10-CM

## 2020-05-18 ENCOUNTER — Ambulatory Visit (INDEPENDENT_AMBULATORY_CARE_PROVIDER_SITE_OTHER): Payer: Medicare Other | Admitting: Psychology

## 2020-05-18 DIAGNOSIS — F339 Major depressive disorder, recurrent, unspecified: Secondary | ICD-10-CM

## 2020-05-25 ENCOUNTER — Ambulatory Visit (INDEPENDENT_AMBULATORY_CARE_PROVIDER_SITE_OTHER): Payer: Medicare Other | Admitting: Psychology

## 2020-05-25 DIAGNOSIS — F339 Major depressive disorder, recurrent, unspecified: Secondary | ICD-10-CM | POA: Diagnosis not present

## 2020-05-31 ENCOUNTER — Other Ambulatory Visit: Payer: Self-pay | Admitting: Neurology

## 2020-05-31 ENCOUNTER — Other Ambulatory Visit: Payer: Self-pay | Admitting: Registered Nurse

## 2020-05-31 DIAGNOSIS — I10 Essential (primary) hypertension: Secondary | ICD-10-CM

## 2020-05-31 NOTE — Telephone Encounter (Signed)
Rx(s) sent to pharmacy electronically.  

## 2020-06-01 ENCOUNTER — Ambulatory Visit (INDEPENDENT_AMBULATORY_CARE_PROVIDER_SITE_OTHER): Payer: Medicare Other | Admitting: Psychology

## 2020-06-01 DIAGNOSIS — F339 Major depressive disorder, recurrent, unspecified: Secondary | ICD-10-CM | POA: Diagnosis not present

## 2020-06-02 ENCOUNTER — Encounter: Payer: Medicare Other | Admitting: Physical Medicine & Rehabilitation

## 2020-06-08 ENCOUNTER — Ambulatory Visit: Payer: Medicare Other | Admitting: Psychology

## 2020-06-15 ENCOUNTER — Ambulatory Visit (INDEPENDENT_AMBULATORY_CARE_PROVIDER_SITE_OTHER): Payer: Medicare Other | Admitting: Psychology

## 2020-06-15 DIAGNOSIS — F339 Major depressive disorder, recurrent, unspecified: Secondary | ICD-10-CM

## 2020-06-16 ENCOUNTER — Other Ambulatory Visit: Payer: Self-pay | Admitting: Physical Medicine & Rehabilitation

## 2020-06-22 ENCOUNTER — Ambulatory Visit (INDEPENDENT_AMBULATORY_CARE_PROVIDER_SITE_OTHER): Payer: Medicare Other | Admitting: Psychology

## 2020-06-22 DIAGNOSIS — F339 Major depressive disorder, recurrent, unspecified: Secondary | ICD-10-CM | POA: Diagnosis not present

## 2020-06-26 ENCOUNTER — Other Ambulatory Visit: Payer: Self-pay | Admitting: Neurology

## 2020-06-26 ENCOUNTER — Other Ambulatory Visit: Payer: Self-pay | Admitting: Registered Nurse

## 2020-06-26 DIAGNOSIS — I10 Essential (primary) hypertension: Secondary | ICD-10-CM

## 2020-06-26 NOTE — Telephone Encounter (Signed)
Called pt. And informed them of need for appt. Pt will call back for appt

## 2020-06-26 NOTE — Telephone Encounter (Signed)
Please assist with getting pt another f/u visit to see how she is doing. I will send in this refill as curtsy.

## 2020-06-29 ENCOUNTER — Ambulatory Visit (INDEPENDENT_AMBULATORY_CARE_PROVIDER_SITE_OTHER): Payer: Medicare Other | Admitting: Psychology

## 2020-06-29 DIAGNOSIS — F339 Major depressive disorder, recurrent, unspecified: Secondary | ICD-10-CM | POA: Diagnosis not present

## 2020-07-06 ENCOUNTER — Ambulatory Visit (INDEPENDENT_AMBULATORY_CARE_PROVIDER_SITE_OTHER): Payer: Medicare Other | Admitting: Psychology

## 2020-07-06 DIAGNOSIS — F339 Major depressive disorder, recurrent, unspecified: Secondary | ICD-10-CM

## 2020-07-07 ENCOUNTER — Other Ambulatory Visit: Payer: Self-pay

## 2020-07-07 ENCOUNTER — Ambulatory Visit (INDEPENDENT_AMBULATORY_CARE_PROVIDER_SITE_OTHER): Payer: Medicare Other | Admitting: Neurology

## 2020-07-07 DIAGNOSIS — G5131 Clonic hemifacial spasm, right: Secondary | ICD-10-CM

## 2020-07-07 MED ORDER — ONABOTULINUMTOXINA 100 UNITS IJ SOLR
15.0000 [IU] | Freq: Once | INTRAMUSCULAR | Status: AC
  Administered 2020-07-07: 15 [IU] via INTRAMUSCULAR

## 2020-07-07 NOTE — Procedures (Signed)
[facility]   History:  Diagnosis: Hemifacial spasm   Initial side: right   Result History  Pt reports that it helped.   Consent obtained from: The patient Benefits discussed included, but were not limited to decreased muscle tightness, increased joint range of motion, and decreased pain.  Risk discussed included, but were not limited pain and discomfort, bleeding, bruising, excessive weakness, venous thrombosis, muscle atrophy and dysphagia.  A copy of the patient medication guide was given to the patient which explains the blackbox warning.  Patients identity and treatment sites confirmed Yes.  .  Details of Procedure: Skin was cleaned with alcohol.  A 30 gauge, 1/2 inch needle was introduced to target mm.  Prior to injection, the needle plunger was aspirated to make sure the needle was not within a blood vessel.  There was no blood retrieved on aspiration.    Following is a summary of the muscles injected  And the amount of Botulinum toxin used:  Injections  Location Left  Right Units Number of sites        Corrugator      Frontalis      Lower Lid, Lateral  2.5 2.5   Lower Lid Medial      Upper Lid, Lateral  2.5 2.5   Upper Lid, Medial      Canthus 2.5 5.0 7.0   nasalis  2.5 2.5   Masseter      Procerus      Zygomaticus Major      TOTAL UNITS:   15    Agent: Botulinum Type A ( Onobotulinum Toxin type A ).  1 vials of Botox were used, each containing 50 units and freshly diluted with 2 mL of sterile, non-perserved saline   Total injected (Units): 15  Total wasted (Units):0 Pt tolerated procedure well without complications.   Reinjection is anticipated in 3 months.

## 2020-07-13 ENCOUNTER — Ambulatory Visit (INDEPENDENT_AMBULATORY_CARE_PROVIDER_SITE_OTHER): Payer: Medicare Other | Admitting: Psychology

## 2020-07-13 DIAGNOSIS — F339 Major depressive disorder, recurrent, unspecified: Secondary | ICD-10-CM | POA: Diagnosis not present

## 2020-07-13 NOTE — Progress Notes (Signed)
Assessment/Plan:   1.  MSA -Continue carbidopa/levodopa 25/100, but I did tell her that she could take up to 8 to 10 tablets/day if she is having a particularly bad day with tremor (currently taking 8 tablets/day).  Did tell her that this would likely increase dyskinesia, however. -Continue carbidopa/levodopa 50/200 at bedtime  -Patient reports that her primary care has left (has had several primary care physicians leave since her diagnosis).  This has somewhat frustrated her.  She wants a primary care physician here in [place], which is challenging, given that she has Medicare and many in this area are closed to NP.  I am going to try to call around and make some phone calls for her 2. Hemifacial spasm, right -Last Botox injections on September 10.  Those have been very helpful. 3. Dysphagia. -She had a MBE on November 19, 2017. This was unremarkable, with the exception of the tablet of barium lodged at the distal esophagus at one point. Mechanical soft diet with thin liquids as recommended. 4. Urinary incontinence -saw urology and on oxybutynin. Doing better on Myrbetriq, but having extreme dry mouth.  5. RBD -better with klonopin, 0.5 mg at night. 6.Depression, mild -Continue Cymbalta, 30 mg daily.   That has helped.    Discussed that we could always increase this in the future for neuropathic pain, but are starting other medications right now.  Discussed risk, benefits, side effects. Patient agreeable.             -She is doing counseling with Myra Gianotti and finds that beneficial 7.  Leg and feet pain  -Now following with Dr. Letta Pate.  Had lumbar medial branch blocks in July, but that seemed to make pain worse and she decided to hold off on further injections.  -On gabapentin, 300 mg, 1 capsule twice per day.  The gabapentin has helped the neuropathic pain. 8.  SOB  -discussed  referral to pulm for vest and she wants to hold for now. 9.  Parkinson's dyskinesia  -Start amantadine, 100 mg 3 times per day.  R/B/SE were discussed.  The opportunity to ask questions was given and they were answered to the best of my ability.  The patient expressed understanding and willingness to follow the outlined treatment protocols.  Subjective:   Jacqueline Atkinson was seen today in follow up for MSA.  Patient's last Botox for hemifacial spasm on September 10.  Those have been working well.  She has seen Dr. Letta Pate several times since our last visit and received injections on July 9, but that seemed to intensify the pain and she decided not to pursue them further.  I have reviewed his records and corresponded with him as well.  She was started on gabapentin, 300 mg, 1 capsule twice per day last visit.  States that her legs are better but the arthritic pain is still bad.  She is using a TENS like unit and its helping some. she has had falls since last visit.   The biggest issue is freezing.    No significant injury or fracture.  She is becoming SOB. She is sleeping in lift chair - has 3 at home.  Current prescribed movement disorder medications: carbidopa/levodopa 25/100, 2 at 9am/1 at 11am/2 at 1pm/1 at 3pm/1 at 5pm/1 at 7pm Carbidopa/levodopa 50/200 at bedtime Clonazepam 0.5 mg at bedtime Cymbalta, 30 mg daily Gabapentin, 300 mg, 1 capsule twice per day  PREVIOUS MEDICATIONS: carbidopa/levodopa 25/100 CR (didn't help changing to it);  ALLERGIES:   Allergies  Allergen Reactions  . Sulfa Antibiotics     Long time ago and does not remember reaction    CURRENT MEDICATIONS:  Outpatient Encounter Medications as of 07/20/2020  Medication Sig  . acetaminophen (TYLENOL) 500 MG tablet Take 1,000 mg by mouth every 8 (eight) hours as needed for moderate pain.   Marland Kitchen AMBULATORY NON FORMULARY MEDICATION Wheelchair Ramp  DX: G23.8  . atenolol (TENORMIN) 100 MG tablet Take 1 tablet (100 mg) by  mouth once daily.  . carbidopa-levodopa (SINEMET CR) 50-200 MG tablet Take 1 tablet by mouth at bedtime.  . carbidopa-levodopa (SINEMET IR) 25-100 MG tablet Take 1 tablet by mouth 8 times daily. Take 1 to 2 extra tablets daily as needed.  . Carboxymethylcellul-Glycerin (LUBRICATING EYE DROPS OP) Place 1 drop into both eyes 3 (three) times daily as needed (dry eyes).  . clonazePAM (KLONOPIN) 0.5 MG tablet Take 1 tablet by mouth at bedtime.  . cloNIDine (CATAPRES) 0.2 MG tablet Take 1 tablet by mouth every morning and, take 2 tablets by mouth every evening. **Needs appointment for additional refills.**  . DULoxetine (CYMBALTA) 60 MG capsule Take 1 capsule by mouth once daily  . esomeprazole (NEXIUM) 20 MG capsule Take 40 mg by mouth 2 (two) times daily. 2 tab in morning and 2 tab at night  . gabapentin (NEURONTIN) 300 MG capsule Take 1 capsule by mouth twice daily.  . hydrALAZINE (APRESOLINE) 50 MG tablet TAKE 1 TABLET BY MOUTH 4 TIMES DAILY AS NEEDED FOR ELEVATED BP AND TAKE ONE A NIGHT TIME  . MYRBETRIQ 50 MG TB24 tablet Take 1 tablet by mouth daily.   Marland Kitchen oxybutynin (DITROPAN) 5 MG tablet Take 1 tablet by mouth twice daily.  Marland Kitchen senna-docusate (SENOKOT S) 8.6-50 MG tablet Take 2 tablets by mouth at bedtime. (Patient taking differently: Take 2 tablets by mouth daily. 2 tab in morning and 2 at night)  . telmisartan (MICARDIS) 80 MG tablet Take 1 tablet (80 mg total) by mouth daily.   No facility-administered encounter medications on file as of 07/20/2020.    Objective:   PHYSICAL EXAMINATION:    VITALS:   Vitals:   07/20/20 1533  BP: (!) 159/88  Pulse: 80  SpO2: 97%  Weight: 245 lb (111.1 kg)  Height: 5\' 8"  (1.727 m)    GEN:  The patient appears stated age and is in NAD. HEENT:  Normocephalic, atraumatic.  The mucous membranes are moist. The superficial temporal arteries are without ropiness or tenderness. CV:  RRR Lungs:  CTAB Neck/HEME:  There are no carotid bruits  bilaterally.  Neurological examination:  Orientation: The patient is alert and oriented x3. Cranial nerves: There is good facial symmetry with facial hypomimia. The speech is fluent and clear. Soft palate rises symmetrically and there is no tongue deviation. Hearing is intact to conversational tone. Sensation: Sensation is intact to light touch throughout Motor: Strength is at least antigravity x4.  Movement examination: Tone: There is normal tone in the upper and lower extremities. Abnormal movements: Mild lower extremity dyskinesia. Coordination:  There is  decremation with RAM's, especially in the upper extremities on the right. Gait and Station: The patient pushes off of her Rollator to arise.  She is flexed at the waist.  She ambulates with her walker.  She is short stepped.  I have reviewed and interpreted the following labs independently   Chemistry      Component Value Date/Time   NA 141 02/18/2020 1617   K  4.7 02/18/2020 1617   CL 102 02/18/2020 1617   CO2 24 02/18/2020 1617   BUN 15 02/18/2020 1617   CREATININE 1.09 (H) 02/18/2020 1617   CREATININE 0.91 04/23/2016 0913      Component Value Date/Time   CALCIUM 9.3 02/18/2020 1617   ALKPHOS 123 (H) 02/18/2020 1617   AST 14 02/18/2020 1617   ALT 7 02/18/2020 1617   BILITOT 0.3 02/18/2020 1617     Lab Results  Component Value Date   TSH 0.533 02/18/2020     Total time spent on today's visit was 40 minutes, including both face-to-face time and nonface-to-face time.  Time included that spent on review of records (prior notes available to me/labs/imaging if pertinent), discussing treatment and goals, answering patient's questions and coordinating care.  Cc:  Patient, No Pcp Per

## 2020-07-20 ENCOUNTER — Ambulatory Visit (INDEPENDENT_AMBULATORY_CARE_PROVIDER_SITE_OTHER): Payer: Medicare Other | Admitting: Psychology

## 2020-07-20 ENCOUNTER — Ambulatory Visit: Payer: Medicare Other | Admitting: Neurology

## 2020-07-20 ENCOUNTER — Encounter: Payer: Self-pay | Admitting: Neurology

## 2020-07-20 ENCOUNTER — Other Ambulatory Visit: Payer: Self-pay

## 2020-07-20 VITALS — BP 159/88 | HR 80 | Ht 68.0 in | Wt 245.0 lb

## 2020-07-20 DIAGNOSIS — G20 Parkinson's disease: Secondary | ICD-10-CM | POA: Diagnosis not present

## 2020-07-20 DIAGNOSIS — G249 Dystonia, unspecified: Secondary | ICD-10-CM | POA: Diagnosis not present

## 2020-07-20 DIAGNOSIS — F339 Major depressive disorder, recurrent, unspecified: Secondary | ICD-10-CM | POA: Diagnosis not present

## 2020-07-20 DIAGNOSIS — G238 Other specified degenerative diseases of basal ganglia: Secondary | ICD-10-CM

## 2020-07-20 DIAGNOSIS — G5131 Clonic hemifacial spasm, right: Secondary | ICD-10-CM | POA: Diagnosis not present

## 2020-07-20 MED ORDER — AMANTADINE HCL 100 MG PO CAPS: 100.0000 mg | ORAL_CAPSULE | Freq: Three times a day (TID) | ORAL | 1 refills | Status: DC

## 2020-07-20 NOTE — Patient Instructions (Signed)
Week 1:  Start amantadine - 100 mg in the AM and 100 mg in the evening (dinner) Week 2 and thereafter:  Amantadine 100 mg three times per day, 9am/1pm/5pm  The physicians and staff at [facility] are committed to providing excellent care. You may receive a survey requesting feedback about your experience at our office. We strive to receive "very good" responses to the survey questions. If you feel that your experience would prevent you from giving the office a "very good " response, please contact our office to try to remedy the situation. We may be reached at [phone]. Thank you for taking the time out of your busy day to complete the survey.

## 2020-07-21 ENCOUNTER — Telehealth: Payer: Self-pay | Admitting: Neurology

## 2020-07-21 DIAGNOSIS — R0602 Shortness of breath: Secondary | ICD-10-CM

## 2020-07-21 NOTE — Telephone Encounter (Signed)
Patient made aware that referral has been placed. She voiced understanding.

## 2020-07-21 NOTE — Telephone Encounter (Signed)
Refer to Dr Valeta Harms for SOB

## 2020-07-21 NOTE — Telephone Encounter (Signed)
Patient called in stating she has decided she would like to go ahead and see a pulmonologist. She would like for a referral to be sent out.

## 2020-07-26 ENCOUNTER — Telehealth: Payer: Self-pay

## 2020-07-26 NOTE — Telephone Encounter (Signed)
Contacted patient and she states she needs a new pcp because her current one (Dr Delia Chimes left the practice)   Jacqueline Atkinson and schedule patient an appt with Dr Gaynelle Arabian on 01/22/2021 at 3: 30pm.   Patient notified and voiced understanding.

## 2020-07-27 ENCOUNTER — Ambulatory Visit (INDEPENDENT_AMBULATORY_CARE_PROVIDER_SITE_OTHER): Payer: Medicare Other | Admitting: Psychology

## 2020-07-27 DIAGNOSIS — F4321 Adjustment disorder with depressed mood: Secondary | ICD-10-CM

## 2020-07-30 ENCOUNTER — Other Ambulatory Visit: Payer: Self-pay | Admitting: Registered Nurse

## 2020-07-30 ENCOUNTER — Other Ambulatory Visit: Payer: Self-pay | Admitting: Neurology

## 2020-07-30 DIAGNOSIS — I10 Essential (primary) hypertension: Secondary | ICD-10-CM

## 2020-07-30 NOTE — Telephone Encounter (Signed)
Requested Prescriptions  Pending Prescriptions Disp Refills  . telmisartan (MICARDIS) 80 MG tablet [Pharmacy Med Name: Telmisartan 80mg  Tablet] 90 tablet 0    Sig: Take 1 tablet by mouth once daily.     Cardiovascular:  Angiotensin Receptor Blockers Failed - 07/30/2020  5:50 AM      Failed - Cr in normal range and within 180 days    Creat  Date Value Ref Range Status  04/23/2016 0.91 0.50 - 0.99 mg/dL Final    Comment:      For patients > or = 64 years of age: The upper reference limit for Creatinine is approximately 13% higher for people identified as African-American.      Creatinine, Ser  Date Value Ref Range Status  02/18/2020 1.09 (H) 0.57 - 1.00 mg/dL Final         Failed - Last BP in normal range    BP Readings from Last 1 Encounters:  07/20/20 (!) 159/88         Passed - K in normal range and within 180 days    Potassium  Date Value Ref Range Status  02/18/2020 4.7 3.5 - 5.2 mmol/L Final         Passed - Patient is not pregnant      Passed - Valid encounter within last 6 months    Recent Outpatient Visits          5 months ago Routine general medical examination at a health care facility   [facility], Delfino Lovett, NP   11 months ago Essential hypertension   [facility], Delfino Lovett, NP   1 year ago Bilateral wrist pain   Primary Care at [facility], Arlie Solomons, MD   1 year ago Medicare annual wellness visit, subsequent   Primary Care at [facility], Arlie Solomons, MD   1 year ago Malignant hypertension   [facility], Laurey Arrow, MD

## 2020-08-03 ENCOUNTER — Ambulatory Visit (INDEPENDENT_AMBULATORY_CARE_PROVIDER_SITE_OTHER): Payer: Medicare Other | Admitting: Psychology

## 2020-08-03 ENCOUNTER — Other Ambulatory Visit: Payer: Self-pay | Admitting: Registered Nurse

## 2020-08-03 DIAGNOSIS — I10 Essential (primary) hypertension: Secondary | ICD-10-CM

## 2020-08-03 DIAGNOSIS — F339 Major depressive disorder, recurrent, unspecified: Secondary | ICD-10-CM | POA: Diagnosis not present

## 2020-08-03 NOTE — Telephone Encounter (Signed)
Patient is requesting a refill of the following medications: Requested Prescriptions   Pending Prescriptions Disp Refills   cloNIDine (CATAPRES) 0.2 MG tablet [Pharmacy Med Name: Clonidine Hydrochloride 0.2mg  Tablet] 180 tablet 0    Sig: Take 1 tablet by mouth every morning and, take 2 tablets by mouth every evening. **Needs appointment for additional refills.**    Date of patient request: 08/03/20 Last office visit: 02/18/20 Date of last refill:06/26/20 Last refill amount:180 Follow up time period per chart:

## 2020-08-10 ENCOUNTER — Ambulatory Visit (INDEPENDENT_AMBULATORY_CARE_PROVIDER_SITE_OTHER): Payer: Medicare Other | Admitting: Psychology

## 2020-08-10 DIAGNOSIS — F339 Major depressive disorder, recurrent, unspecified: Secondary | ICD-10-CM | POA: Diagnosis not present

## 2020-08-11 ENCOUNTER — Ambulatory Visit: Payer: Medicare Other | Admitting: Neurology

## 2020-08-17 ENCOUNTER — Ambulatory Visit (INDEPENDENT_AMBULATORY_CARE_PROVIDER_SITE_OTHER): Payer: Medicare Other | Admitting: Psychology

## 2020-08-17 DIAGNOSIS — F339 Major depressive disorder, recurrent, unspecified: Secondary | ICD-10-CM | POA: Diagnosis not present

## 2020-08-24 ENCOUNTER — Ambulatory Visit: Payer: Medicare Other | Admitting: Psychology

## 2020-08-29 ENCOUNTER — Other Ambulatory Visit: Payer: Self-pay | Admitting: Neurology

## 2020-08-29 ENCOUNTER — Other Ambulatory Visit: Payer: Self-pay | Admitting: Registered Nurse

## 2020-08-29 DIAGNOSIS — I10 Essential (primary) hypertension: Secondary | ICD-10-CM

## 2020-08-29 NOTE — Telephone Encounter (Signed)
Requested medication (s) are due for refill today: no  requested medication (s) are on the active medication list: yes  Last refill:  08/02/2020  Future visit scheduled: no  Notes to clinic:  overdue for follow up appointment    Requested Prescriptions  Pending Prescriptions Disp Refills   atenolol (TENORMIN) 100 MG tablet [Pharmacy Med Name: Atenolol 100mg  Tablet] 90 tablet 0    Sig: Take 1 tablet (100 mg) by mouth once daily.      Cardiovascular:  Beta Blockers Failed - 08/29/2020  5:50 AM      Failed - Last BP in normal range    BP Readings from Last 1 Encounters:  07/20/20 (!) 159/88          Failed - Valid encounter within last 6 months    Recent Outpatient Visits           6 months ago Routine general medical examination at a health care facility   [facility], Delfino Lovett, NP   12 months ago Essential hypertension   [facility], Delfino Lovett, NP   1 year ago Bilateral wrist pain   Primary Care at [facility], Arlie Solomons, MD   1 year ago Medicare annual wellness visit, subsequent   Primary Care at [facility] [place], Arlie Solomons, MD   1 year ago Malignant hypertension   Primary Care at [place], MD              Passed - Last Heart Rate in normal range    Pulse Readings from Last 1 Encounters:  07/20/20 80

## 2020-08-31 ENCOUNTER — Ambulatory Visit: Payer: Medicare Other | Admitting: Psychology

## 2020-09-07 ENCOUNTER — Ambulatory Visit: Payer: Medicare Other | Admitting: Psychology

## 2020-09-14 ENCOUNTER — Ambulatory Visit (INDEPENDENT_AMBULATORY_CARE_PROVIDER_SITE_OTHER): Payer: Medicare Other | Admitting: Psychology

## 2020-09-14 ENCOUNTER — Encounter: Payer: Self-pay | Admitting: Neurology

## 2020-09-14 DIAGNOSIS — F339 Major depressive disorder, recurrent, unspecified: Secondary | ICD-10-CM

## 2020-09-14 NOTE — Progress Notes (Addendum)
Per BV patient;s SP is [facility]. 11/18- called to set up account and give verbal script for Botox. 12/1-per phone call with SP [facility] pharmacist Mahina said must buy and bill because its only covered under medical benefits.

## 2020-09-18 ENCOUNTER — Telehealth: Payer: Medicare Other | Admitting: Registered Nurse

## 2020-09-18 ENCOUNTER — Encounter: Payer: Self-pay | Admitting: Registered Nurse

## 2020-09-18 ENCOUNTER — Other Ambulatory Visit: Payer: Self-pay

## 2020-09-18 DIAGNOSIS — I10 Essential (primary) hypertension: Secondary | ICD-10-CM

## 2020-09-18 MED ORDER — ATENOLOL 100 MG PO TABS: ORAL_TABLET | ORAL | 1 refills | Status: DC

## 2020-09-18 NOTE — Patient Instructions (Signed)
° ° ° °  If you have lab work done today you will be contacted with your lab results within the next 2 weeks.  If you have not heard from us then please contact us. The fastest way to get your results is to register for My Chart. ° ° °IF you received an x-ray today, you will receive an invoice from [facility]. Please contact [facility] at [phone] with questions or concerns regarding your invoice.  ° °IF you received labwork today, you will receive an invoice from [facility]. Please contact [facility] at [phone] with questions or concerns regarding your invoice.  ° °Our billing staff will not be able to assist you with questions regarding bills from these companies. ° °You will be contacted with the lab results as soon as they are available. The fastest way to get your results is to activate your My Chart account. Instructions are located on the last page of this paperwork. If you have not heard from us regarding the results in 2 weeks, please contact this office. °  ° ° ° °

## 2020-09-18 NOTE — Progress Notes (Addendum)
Telemedicine Encounter- SOAP NOTE Established Patient  This telephone encounter was conducted with the patient's (or proxy's) verbal consent via audio telecommunications: yes  Patient was instructed to have this encounter in a suitably private space; and to only have persons present to whom they give permission to participate. In addition, patient identity was confirmed by use of name plus two identifiers (DOB and address).  I discussed the limitations, risks, security and privacy concerns of performing an evaluation and management service by telephone and the availability of in person appointments. I also discussed with the patient that there may be a patient responsible charge related to this service. The patient expressed understanding and agreed to proceed.  I spent a total of 12 minutes talking with the patient or their proxy.  Patient at home Provider in office  Chief Complaint  Patient presents with  . Medical Management of Chronic Issues  . Medication Refill  . Hypertension    Subjective   Jacqueline Atkinson is a 64 y.o. established patient. Telephone visit today for med refill on atenolol  HPI Pt reports she has been doing very well lately. Chronic conditions stable. No new complaints Hypertension: Patient Currently taking: atenolol 100mg  PO qd Good effect. No AEs. Denies CV symptoms including: chest pain, shob, doe, headache, visual changes, fatigue, claudication, and dependent edema.   Previous readings and labs: BP Readings from Last 3 Encounters:  07/20/20 (!) 159/88  05/05/20 (!) 167/103  04/07/20 131/84   Lab Results  Component Value Date   CREATININE 1.09 (H) 02/18/2020      Patient Active Problem List   Diagnosis Date Noted  . Chronic constipation 09/30/2018  . Dry mouth 09/30/2018  . 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 Dry eye 09/30/2018  . Hydronephrosis 05/04/2018  . Flank pain 05/04/2018  . Intra-abdominal and pelvic swelling,  mass and lump, unspecified site 04/02/2018  . Pelvic mass 03/14/2018  . Dyspnea on exertion 08/05/2017  . Hyperlipidemia 04/23/2016  . Arthritis 04/23/2016  . 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 Postoperative anemia due to acute blood loss 08/29/2015  . 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 Gait difficulty 12/29/2013  . Back pain 12/29/2013  . Paresthesia 12/19/2012  . Essential hypertension 11/28/2012  . Polypharmacy 11/28/2012  . GERD (gastroesophageal reflux disease) 11/28/2012    Past Medical History:  Diagnosis Date  . Anemia    hx of  . Anxiety   . Arthritis    Left knee  osteoarthritis  . GERD (gastroesophageal reflux disease)   . Heart murmur   . History of hiatal hernia    small  . Hx of seasonal allergies    occ. use OTC Mucinex or Antihistamine.  . Hyperlipidemia   . Hypertension   . Neuromuscular disorder ([place])    "multi system atrophy disease"- "pain, bilateral foot numbness, left knee pain"- Dr. Carles Collet is following.  . Ovarian cancer on left ([place]) 2019   serous borderline s/p BSO  . Pneumonia   . Shoulder disorder    right shoulder "rigidity due to Multi system atrophy"- ROM not limited"just tires me"    Current Outpatient Medications  Medication Sig Dispense Refill  . clonazePAM (KLONOPIN) 0.5 MG tablet Take 1 tablet by mouth at bedtime. 30 tablet 5  . acetaminophen (TYLENOL) 500 MG tablet Take 1,000 mg by mouth every 8 (eight) hours as needed for moderate pain.     Marland Kitchen amantadine (SYMMETREL) 100 MG capsule Take 1 capsule (100 mg  total) by mouth in the morning, at noon, and at bedtime. 270 capsule 1  . AMBULATORY NON FORMULARY MEDICATION Wheelchair Ramp  DX: G23.8 1 Device 0  . atenolol (TENORMIN) 100 MG tablet Take 1 tablet (100 mg) by mouth once daily. 90 tablet 1  . carbidopa-levodopa (SINEMET CR) 50-200 MG tablet Take 1 tablet by mouth at bedtime. 90 tablet 0  . carbidopa-levodopa (SINEMET IR) 25-100 MG tablet Take 1 tablet by mouth  8 times daily. Take 1 to 2 extra tablets daily as needed. 300 tablet 0  . Carboxymethylcellul-Glycerin (LUBRICATING EYE DROPS OP) Place 1 drop into both eyes 3 (three) times daily as needed (dry eyes).    . cloNIDine (CATAPRES) 0.2 MG tablet Take 1 tablet by mouth every morning and, take 2 tablets by mouth every evening. **Needs appointment for additional refills.** 180 tablet 0  . DULoxetine (CYMBALTA) 60 MG capsule Take 1 capsule by mouth once daily 30 capsule 2  . esomeprazole (NEXIUM) 20 MG capsule Take 40 mg by mouth 2 (two) times daily. 2 tab in morning and 2 tab at night    . gabapentin (NEURONTIN) 300 MG capsule Take 1 capsule by mouth twice daily. 180 capsule 0  . hydrALAZINE (APRESOLINE) 50 MG tablet TAKE 1 TABLET BY MOUTH 4 TIMES DAILY AS NEEDED FOR ELEVATED BP AND TAKE ONE A NIGHT TIME 360 tablet 0  . MYRBETRIQ 50 MG TB24 tablet Take 1 tablet by mouth daily.  90 tablet 2  . oxybutynin (DITROPAN) 5 MG tablet Take 1 tablet by mouth twice daily. 180 tablet 2  . senna-docusate (SENOKOT S) 8.6-50 MG tablet Take 2 tablets by mouth at bedtime. (Patient taking differently: Take 2 tablets by mouth daily. 2 tab in morning and 2 at night)    . telmisartan (MICARDIS) 80 MG tablet Take 1 tablet by mouth once daily. 90 tablet 0   No current facility-administered medications for this visit.    Allergies  Allergen Reactions  . Sulfa Antibiotics     Long time ago and does not remember reaction    Social History   Socioeconomic History  . Marital status: Divorced    Spouse name: Not on file  . Number of children: 7  . Years of education: 36  . Highest education level: High school graduate  Occupational History  . Occupation: ACTIVITY COORDINATOR    Employer: [employer]  Tobacco Use  . Smoking status: Former Smoker    Packs/day: 1.00    Years: 12.00    Pack years: 12.00    Quit date: 03/14/1988    Years since quitting: 32.5  . Smokeless tobacco: Never Used  Vaping Use   . Vaping Use: Never used  Substance and Sexual Activity  . Alcohol use: No  . Drug use: No    Comment: edible ebrownies  months ago for pain  . Sexual activity: Not Currently  Other Topics Concern  . Not on file  Social History Narrative   Right handed   One story home   Drinks caffeine on occasionally   Social Determinants of Health   Financial Resource Strain:   . Difficulty of Paying Living Expenses: Not on file  Food Insecurity:   . Worried About Charity fundraiser in the Last Year: Not on file  . Ran Out of Food in the Last Year: Not on file  Transportation Needs:   . Lack of Transportation (Medical): Not on file  . Lack of Transportation (Non-Medical): Not  on file  Physical Activity:   . Days of Exercise per Week: Not on file  . Minutes of Exercise per Session: Not on file  Stress:   . Feeling of Stress : Not on file  Social Connections:   . Frequency of Communication with Friends and Family: Not on file  . Frequency of Social Gatherings with Friends and Family: Not on file  . Attends Religious Services: Not on file  . Active Member of Clubs or Organizations: Not on file  . Attends Archivist Meetings: Not on file  . Marital Status: Not on file  Intimate Partner Violence:   . Fear of Current or Ex-Partner: Not on file  . Emotionally Abused: Not on file  . Physically Abused: Not on file  . Sexually Abused: Not on file    Review of Systems  Constitutional: Negative.   HENT: Negative.   Eyes: Negative.   Respiratory: Negative.   Cardiovascular: Negative.   Gastrointestinal: Negative.   Genitourinary: Negative.   Musculoskeletal: Negative.   Skin: Negative.   Neurological: Negative.   Endo/Heme/Allergies: Negative.   Psychiatric/Behavioral: Negative.     Objective   Vitals as reported by the patient: Today's Vitals   09/18/20 1020  Weight: 240 lb (108.9 kg)  Height: 5\' 8"  (1.727 m)    Chantrell was seen today for medical management of  chronic issues, medication refill and hypertension.  Diagnoses and all orders for this visit:  Essential hypertension -     atenolol (TENORMIN) 100 MG tablet; Take 1 tablet (100 mg) by mouth once daily.   PLAN  Refill x 6 mo  Return for labs at that time, sooner with concerns  Patient encouraged to call clinic with any questions, comments, or concerns.  I discussed the assessment and treatment plan with the patient. The patient was provided an opportunity to ask questions and all were answered. The patient agreed with the plan and demonstrated an understanding of the instructions.   The patient was advised to call back or seek an in-person evaluation if the symptoms worsen or if the condition fails to improve as anticipated.  I provided 12 minutes of non-face-to-face time during this encounter.  Maximiano Coss, NP  Primary Care at [facility]

## 2020-09-20 ENCOUNTER — Other Ambulatory Visit: Payer: Self-pay | Admitting: Registered Nurse

## 2020-09-20 DIAGNOSIS — I10 Essential (primary) hypertension: Secondary | ICD-10-CM

## 2020-09-20 MED ORDER — ATENOLOL 100 MG PO TABS: ORAL_TABLET | ORAL | 1 refills | Status: DC

## 2020-09-20 NOTE — Telephone Encounter (Signed)
PT need a refill  atenolol (TENORMIN) 100 MG tablet [[id number]]  [facility] [place], [place]  [address] [place] [address]  Phone: [phone] Fax: [phone]

## 2020-09-28 ENCOUNTER — Other Ambulatory Visit: Payer: Self-pay | Admitting: Registered Nurse

## 2020-09-28 ENCOUNTER — Ambulatory Visit: Payer: Medicare Other | Admitting: Psychology

## 2020-09-28 ENCOUNTER — Other Ambulatory Visit: Payer: Self-pay | Admitting: Neurology

## 2020-09-28 DIAGNOSIS — I10 Essential (primary) hypertension: Secondary | ICD-10-CM

## 2020-09-28 NOTE — Telephone Encounter (Signed)
   Last visit 09/18/20 (tele visit, no bp mentioned but had a curtesy refill prior to that visit  Future visit scheduled no  Notes to clinic Please assess if the telemedicine visit covers renewing this rx.

## 2020-09-28 NOTE — Telephone Encounter (Signed)
Rx(s) sent to pharmacy electronically.  

## 2020-09-29 NOTE — Telephone Encounter (Signed)
Patient is requesting a refill of the following medications: Requested Prescriptions   Pending Prescriptions Disp Refills   cloNIDine (CATAPRES) 0.2 MG tablet [Pharmacy Med Name: Clonidine Hydrochloride 0.2mg  Tablet] 180 tablet 0    Sig: Take 1 tablet by mouth every morning and, take 2 tablets by mouth every evening. **Needs appointment for additional refills.**    Date of patient request: 09/29/2020 Last office visit: 09/19/2020 Virtual Date of last refill: 08/04/2020 Last refill amount: 180

## 2020-10-05 ENCOUNTER — Ambulatory Visit: Payer: Medicare Other | Admitting: Psychology

## 2020-10-12 ENCOUNTER — Ambulatory Visit (INDEPENDENT_AMBULATORY_CARE_PROVIDER_SITE_OTHER): Payer: Medicare Other | Admitting: Psychology

## 2020-10-12 ENCOUNTER — Ambulatory Visit (INDEPENDENT_AMBULATORY_CARE_PROVIDER_SITE_OTHER): Payer: Medicare Other | Admitting: Neurology

## 2020-10-12 ENCOUNTER — Other Ambulatory Visit: Payer: Self-pay

## 2020-10-12 ENCOUNTER — Telehealth: Payer: Self-pay

## 2020-10-12 DIAGNOSIS — G5131 Clonic hemifacial spasm, right: Secondary | ICD-10-CM

## 2020-10-12 DIAGNOSIS — F339 Major depressive disorder, recurrent, unspecified: Secondary | ICD-10-CM | POA: Diagnosis not present

## 2020-10-12 DIAGNOSIS — H532 Diplopia: Secondary | ICD-10-CM

## 2020-10-12 MED ORDER — ONABOTULINUMTOXINA 100 UNITS IJ SOLR
100.0000 [IU] | Freq: Once | INTRAMUSCULAR | Status: AC
  Administered 2020-10-12: 15 [IU] via INTRAMUSCULAR

## 2020-10-12 NOTE — Telephone Encounter (Signed)
No answer at 143

## 2020-10-12 NOTE — Telephone Encounter (Signed)
Pt in for botox today, stating that she is having focus/vision issues.  She has seen ophth in the past but didn't feel comfortable with physician she saw.  Told her we would call her back as I didn't have chart during procedure.  Did tell patient though that neuro-ophth was an option.    Call pt and let her know that options are to go to new ophthalmologist [facility] ophthal[facility] would be fine - Dr. Reymundo Poll are who many of my patients see) or to see neuro-ophth (but closest is Dr. Sanda Klein at [facility]).  Also she asked about meds contributing.  Her bladder meds and potentiatially amantadine could be drying out the eyes some contributing to it but I would start with the eye doc.  We can put in referral if she wants

## 2020-10-12 NOTE — Telephone Encounter (Signed)
Left message for patient to contact office.

## 2020-10-12 NOTE — Telephone Encounter (Signed)
Patient seen in the office and wants to have Dr Tat review her meds due to her vision issues.

## 2020-10-12 NOTE — Procedures (Signed)
[facility]   History:  Diagnosis: Hemifacial spasm   Initial side: right   Result History  Pt reports that it helped.   Consent obtained from: The patient Benefits discussed included, but were not limited to decreased muscle tightness, increased joint range of motion, and decreased pain.  Risk discussed included, but were not limited pain and discomfort, bleeding, bruising, excessive weakness, venous thrombosis, muscle atrophy and dysphagia.  A copy of the patient medication guide was given to the patient which explains the blackbox warning.  Patients identity and treatment sites confirmed Yes.  .  Details of Procedure: Skin was cleaned with alcohol.  A 30 gauge, 1/2 inch needle was introduced to target mm.  Prior to injection, the needle plunger was aspirated to make sure the needle was not within a blood vessel.  There was no blood retrieved on aspiration.    Following is a summary of the muscles injected  And the amount of Botulinum toxin used:  Injections  Location Left  Right Units Number of sites        Corrugator      Frontalis      Lower Lid, Lateral  2.5 2.5   Lower Lid Medial      Upper Lid, Lateral  2.5 2.5   Upper Lid, Medial      Canthus 2.5 5.0 7.0   nasalis  2.5 2.5   Masseter      Procerus      Zygomaticus Major      TOTAL UNITS:   15    Agent: Botulinum Type A ( Onobotulinum Toxin type A ).  1 vials of Botox were used, each containing 50 units and freshly diluted with 2 mL of sterile, non-perserved saline   Total injected (Units): 15  Total wasted (Units):85 Pt tolerated procedure well without complications.   Reinjection is anticipated in 3 months.

## 2020-10-13 NOTE — Telephone Encounter (Signed)
Patient notified and voiced understanding.   Referral placed.

## 2020-10-18 ENCOUNTER — Ambulatory Visit: Payer: Medicare Other | Admitting: Psychology

## 2020-10-19 ENCOUNTER — Ambulatory Visit: Payer: Medicare Other | Admitting: Psychology

## 2020-10-25 ENCOUNTER — Institutional Professional Consult (permissible substitution): Payer: Medicare Other | Admitting: Pulmonary Disease

## 2020-10-26 ENCOUNTER — Ambulatory Visit: Payer: Medicare Other | Admitting: Psychology

## 2020-10-28 ENCOUNTER — Other Ambulatory Visit: Payer: Self-pay | Admitting: Neurology

## 2020-10-28 ENCOUNTER — Other Ambulatory Visit: Payer: Self-pay | Admitting: Registered Nurse

## 2020-10-28 DIAGNOSIS — I10 Essential (primary) hypertension: Secondary | ICD-10-CM

## 2020-11-02 ENCOUNTER — Ambulatory Visit: Payer: Medicare Other | Admitting: Psychology

## 2020-11-03 ENCOUNTER — Other Ambulatory Visit: Payer: Self-pay

## 2020-11-03 ENCOUNTER — Telehealth (INDEPENDENT_AMBULATORY_CARE_PROVIDER_SITE_OTHER): Payer: Medicare Other | Admitting: Registered Nurse

## 2020-11-03 DIAGNOSIS — R059 Cough, unspecified: Secondary | ICD-10-CM | POA: Diagnosis not present

## 2020-11-03 MED ORDER — AZITHROMYCIN 250 MG PO TABS: ORAL_TABLET | ORAL | 0 refills | Status: DC

## 2020-11-03 MED ORDER — PREDNISONE 10 MG (21) PO TBPK: ORAL_TABLET | ORAL | 0 refills | Status: DC

## 2020-11-03 MED ORDER — BENZONATATE 200 MG PO CAPS
200.0000 mg | ORAL_CAPSULE | Freq: Two times a day (BID) | ORAL | 0 refills | Status: DC | PRN
Start: 2020-11-03 — End: 2021-02-08

## 2020-11-03 NOTE — Patient Instructions (Signed)
° ° ° °  If you have lab work done today you will be contacted with your lab results within the next 2 weeks.  If you have not heard from us then please contact us. The fastest way to get your results is to register for My Chart. ° ° °IF you received an x-ray today, you will receive an invoice from [facility]. Please contact [facility] at [phone] with questions or concerns regarding your invoice.  ° °IF you received labwork today, you will receive an invoice from [facility]. Please contact [facility] at [phone] with questions or concerns regarding your invoice.  ° °Our billing staff will not be able to assist you with questions regarding bills from these companies. ° °You will be contacted with the lab results as soon as they are available. The fastest way to get your results is to activate your My Chart account. Instructions are located on the last page of this paperwork. If you have not heard from us regarding the results in 2 weeks, please contact this office. °  ° ° ° °

## 2020-11-08 ENCOUNTER — Ambulatory Visit (INDEPENDENT_AMBULATORY_CARE_PROVIDER_SITE_OTHER): Payer: Medicare Other | Admitting: Psychology

## 2020-11-08 DIAGNOSIS — F339 Major depressive disorder, recurrent, unspecified: Secondary | ICD-10-CM | POA: Diagnosis not present

## 2020-11-24 ENCOUNTER — Telehealth: Payer: Self-pay | Admitting: Registered Nurse

## 2020-11-24 NOTE — Telephone Encounter (Signed)
Pt saw Richard on 1/07 and was prescribed abx.  She states now she has thrush, it is all white in her mouth and it burns. Pt requesting magic mouthwash to help get rid of this.   [place], [place]

## 2020-11-27 NOTE — Telephone Encounter (Signed)
Pt called reporting developed Thrush after abx from last OV 11/03/2020

## 2020-11-27 NOTE — Telephone Encounter (Signed)
Pt is status of this message. It looks like the message was never routed to PCP. Please advise at [phone]

## 2020-12-04 ENCOUNTER — Other Ambulatory Visit: Payer: Self-pay | Admitting: Registered Nurse

## 2020-12-04 DIAGNOSIS — B370 Candidal stomatitis: Secondary | ICD-10-CM

## 2020-12-04 MED ORDER — NYSTATIN 100000 UNIT/ML MT SUSP
5.0000 mL | Freq: Four times a day (QID) | OROMUCOSAL | 0 refills | Status: DC
Start: 2020-12-04 — End: 2021-02-08

## 2020-12-04 NOTE — Telephone Encounter (Signed)
Pt complains of thrush post abx please advise

## 2020-12-04 NOTE — Telephone Encounter (Signed)
Patient is calling back again today wanting to know where we are at on prescription for   mouth wash / patient is still needing something  For her thrush / patient is now requesting a paper Rx and her daughter can pick up  .   Please advise

## 2020-12-14 ENCOUNTER — Ambulatory Visit (INDEPENDENT_AMBULATORY_CARE_PROVIDER_SITE_OTHER): Payer: Medicare Other | Admitting: Psychology

## 2020-12-14 DIAGNOSIS — F339 Major depressive disorder, recurrent, unspecified: Secondary | ICD-10-CM

## 2020-12-22 ENCOUNTER — Other Ambulatory Visit: Payer: Self-pay | Admitting: Neurology

## 2020-12-22 ENCOUNTER — Other Ambulatory Visit: Payer: Self-pay | Admitting: Registered Nurse

## 2020-12-22 DIAGNOSIS — I10 Essential (primary) hypertension: Secondary | ICD-10-CM

## 2021-01-04 ENCOUNTER — Ambulatory Visit (INDEPENDENT_AMBULATORY_CARE_PROVIDER_SITE_OTHER): Payer: Medicare Other | Admitting: Psychology

## 2021-01-04 DIAGNOSIS — F339 Major depressive disorder, recurrent, unspecified: Secondary | ICD-10-CM

## 2021-01-11 ENCOUNTER — Ambulatory Visit: Payer: Medicare Other | Admitting: Psychology

## 2021-01-12 ENCOUNTER — Ambulatory Visit: Payer: Medicare Other | Admitting: Neurology

## 2021-01-12 ENCOUNTER — Other Ambulatory Visit: Payer: Self-pay

## 2021-01-12 DIAGNOSIS — G5131 Clonic hemifacial spasm, right: Secondary | ICD-10-CM

## 2021-01-12 MED ORDER — ONABOTULINUMTOXINA 100 UNITS IJ SOLR
30.0000 [IU] | Freq: Once | INTRAMUSCULAR | Status: AC
  Administered 2021-01-12: 30 [IU] via INTRAMUSCULAR

## 2021-01-12 NOTE — Procedures (Signed)
[facility]   History:  Diagnosis: Hemifacial spasm   Initial side: right   Result History  Pt reports that it helped.   Consent obtained from: The patient Benefits discussed included, but were not limited to decreased muscle tightness, increased joint range of motion, and decreased pain.  Risk discussed included, but were not limited pain and discomfort, bleeding, bruising, excessive weakness, venous thrombosis, muscle atrophy and dysphagia.  A copy of the patient medication guide was given to the patient which explains the blackbox warning.  Patients identity and treatment sites confirmed Yes.  .  Details of Procedure: Skin was cleaned with alcohol.  A 30 gauge, 1/2 inch needle was introduced to target mm.  Prior to injection, the needle plunger was aspirated to make sure the needle was not within a blood vessel.  There was no blood retrieved on aspiration.    Following is a summary of the muscles injected  And the amount of Botulinum toxin used:  Injections  Location Left  Right Units Number of sites        Corrugator      Frontalis      Lower Lid, Lateral  2.5 2.5   Lower Lid Medial      Upper Lid, Lateral  2.5 2.5   Upper Lid, Medial      Canthus 2.5 5.0 7.0   nasalis  2.5 2.5   Masseter      Procerus      Zygomaticus Major      TOTAL UNITS:   15    Agent: Botulinum Type A ( Onobotulinum Toxin type A ).  1 vials of Botox were used, each containing 50 units and freshly diluted with 2 mL of sterile, non-perserved saline   Total injected (Units): 15  Total wasted (Units):15 Pt tolerated procedure well without complications.   Reinjection is anticipated in 3 months.

## 2021-01-18 ENCOUNTER — Ambulatory Visit: Payer: Medicare Other | Admitting: Psychology

## 2021-01-22 ENCOUNTER — Telehealth: Payer: Self-pay | Admitting: Neurology

## 2021-01-22 DIAGNOSIS — I10 Essential (primary) hypertension: Secondary | ICD-10-CM | POA: Diagnosis not present

## 2021-01-22 DIAGNOSIS — R7309 Other abnormal glucose: Secondary | ICD-10-CM | POA: Diagnosis not present

## 2021-01-22 DIAGNOSIS — E7800 Pure hypercholesterolemia, unspecified: Secondary | ICD-10-CM | POA: Diagnosis not present

## 2021-01-22 DIAGNOSIS — G903 Multi-system degeneration of the autonomic nervous system: Secondary | ICD-10-CM | POA: Diagnosis not present

## 2021-01-22 NOTE — Telephone Encounter (Signed)
Looks like she probably needs a f/u with Dr. Letta Pate, who prescribes her cymbalta and deals with her nerve pain

## 2021-01-22 NOTE — Telephone Encounter (Signed)
Left message for patient to contact Dr Rosana Berger office because that was the last provider that prescribed the Cymbalta to her. I advised that I would speak with Dr Tat and see if she has any additional recommendations and if she does I would give her a call back.

## 2021-01-23 ENCOUNTER — Ambulatory Visit: Payer: Medicare Other | Admitting: Neurology

## 2021-01-25 ENCOUNTER — Ambulatory Visit: Payer: Medicare Other | Admitting: Psychology

## 2021-01-25 MED ORDER — DULOXETINE HCL 60 MG PO CPEP: 60.0000 mg | ORAL_CAPSULE | Freq: Every day | ORAL | 0 refills | Status: DC

## 2021-01-25 NOTE — Telephone Encounter (Signed)
Spoke with patient and made her aware of Dr Doristine Devoid recommendations. She states she was hoping that Dr Tat would write the medication because she is going to too many doctors and she is trying to cut down on all the providers that she sees. She states she will speak with her pcp about it and also discuss it with Dr Tat at her next visit. She then said her urologist is no longer in network with her insurance and she will need someone to write those rx's so she wont have to hunt down another provider. I advised her to speak with her pcp and see what meds they will prescribe and then go from there. She voiced understanding.

## 2021-01-25 NOTE — Telephone Encounter (Signed)
I'm happy to write her cymbalta if that helps her.  I thought she was saying she was in pain and needed help and I was just referring her back to him since he helped inpast.  You can refill her med

## 2021-01-25 NOTE — Telephone Encounter (Signed)
Rx(s) sent to pharmacy electronically.  Patient made aware and voiced understanding.

## 2021-01-26 ENCOUNTER — Other Ambulatory Visit: Payer: Self-pay | Admitting: Neurology

## 2021-01-26 DIAGNOSIS — E7800 Pure hypercholesterolemia, unspecified: Secondary | ICD-10-CM | POA: Diagnosis not present

## 2021-01-26 DIAGNOSIS — I10 Essential (primary) hypertension: Secondary | ICD-10-CM | POA: Diagnosis not present

## 2021-01-26 DIAGNOSIS — R7309 Other abnormal glucose: Secondary | ICD-10-CM | POA: Diagnosis not present

## 2021-01-30 ENCOUNTER — Other Ambulatory Visit: Payer: Self-pay | Admitting: Neurology

## 2021-01-30 ENCOUNTER — Telehealth: Payer: Self-pay | Admitting: Neurology

## 2021-01-30 MED ORDER — CARBIDOPA-LEVODOPA ER 50-200 MG PO TBCR: 1.0000 | EXTENDED_RELEASE_TABLET | Freq: Every day | ORAL | 0 refills | Status: DC

## 2021-01-30 NOTE — Telephone Encounter (Signed)
Spoke with patient who states she received a call from [facility] stating we declined the patients medication.   Asked patient what was the name of the medication. She states she does not know. advsed patient to contact her pharmacy get the drug name and contact the correct office. She voiced understanding.

## 2021-01-30 NOTE — Telephone Encounter (Signed)
Patient called and states that her medication refill was denied. She does not know the name of the medication or the reason why we denied it  Please call

## 2021-02-01 ENCOUNTER — Ambulatory Visit: Payer: Medicare Other | Admitting: Psychology

## 2021-02-06 NOTE — Progress Notes (Signed)
Assessment/Plan:   1.  MSA -Continue carbidopa/levodopa 25/100, but I did tell her that she could take up to 8 to 10 tablets/day if she is having a particularly bad day with tremor (currently taking 8 tablets/day).  Did tell her that this would likely increase dyskinesia, however. -Continue carbidopa/levodopa 50/200 at bedtime  -I had her meet my new Education officer, museum.  We discussed the atypical support group and plan to get that started back in person (currently meeting online).  She was active in it previously  -discussed getting a first alert.  She had one but didn't like it.  She feels that she has other systems in place 2. Hemifacial spasm, right -Doing well with Botox, although she still has some twitching around the mouth 3. Dysphagia. -She had a MBE on November 19, 2017. This was unremarkable, with the exception of the tablet of barium lodged at the distal esophagus at one point. Mechanical soft diet with thin liquids as recommended. 4. Urinary incontinence -saw urology and on oxybutynin. Doing better on Myrbetriq, but having extreme dry mouth.  5. RBD -better with klonopin, 0.5 mg at night. 6.Depression, mild -Cymbalta has been increased to 60 mg daily (from 30 mg daily)             -She was doing counseling with Myra Gianotti but it was once per month and she states that they cancelled it.   7.  Leg and feet pain  -Has followed with Dr. Letta Pate in the past.  Had lumbar medial branch blocks in July, but that seemed to make pain worse and she decided to hold off on further injections.  For now, she does not want to go back, primarily because of logistical issues getting back and forth  -On gabapentin, 300 mg, 1 capsule twice per day.  The gabapentin has helped the neuropathic pain. 8.  SOB  -discussed pulm referral but she doesn't want to do that.  She is tired of going to more  physicians. 9.  Parkinson's dyskinesia  -Continue amantadine, 100 mg 3 times per day 10.  HTN  -told to f/u with Gaynelle Arabian, MD.  Wasn't bad today (was high) but she is reporting significant elevations at home.  Subjective:   Jacqueline Atkinson was seen today in follow up for MSA.  She continues to do Botox for hemifacial spasm and that seems to be working well.  We referred her to Dr. Marisue Humble since last visit, as she needed a new primary care physician.  She has since established with him.  She has asked me to start writing her Cymbalta for her depression and her gabapentin for the pain.  This is primarily so she does not have to go back and forth to Dr. Letta Pate.  I really have no objection to that, so long as she does not need more pain management than that.  Last visit, we started her on amantadine for the dyskinesia.  She reports that it is helping.  Her BP has been up at home.  She hasn't told her PCP yet (states that its SBP was over 200 at times and she has associated HA).  Is having SOB.  She has had some falls but none with injuries.  Has son in law at home to help during day.  Swallowing is normal/good.  She is shouting out at night but not getting physical with klonopin.  She sleeps in lift chair but has trouble getting out of the hospital bed.  Current  prescribed movement disorder medications: carbidopa/levodopa 25/100, 2 at 9am/1 at 11am/2 at 1pm/1 at 3pm/1 at 5pm/1 at 7pm Carbidopa/levodopa 50/200 at bedtime Clonazepam 0.5 mg at bedtime Cymbalta, 60 mg daily Gabapentin, 300 mg, 1 capsule twice per day  Amantadine, 100 mg 3 times per day (started last visit) PREVIOUS MEDICATIONS: carbidopa/levodopa 25/100 CR (didn't help changing to it);    ALLERGIES:   Allergies  Allergen Reactions  . Sulfa Antibiotics     Long time ago and does not remember reaction    CURRENT MEDICATIONS:  Outpatient Encounter Medications as of 02/08/2021  Medication Sig  . acetaminophen (TYLENOL) 500  MG tablet Take 1,000 mg by mouth every 8 (eight) hours as needed for moderate pain.   Marland Kitchen amantadine (SYMMETREL) 100 MG capsule Take 1 capsule by mouth three times daily in the morning, at noon, and at bedtime.  . AMBULATORY NON FORMULARY MEDICATION Wheelchair Ramp  DX: G23.8  . atenolol (TENORMIN) 100 MG tablet Take 1 tablet (100 mg) by mouth once daily.  . carbidopa-levodopa (SINEMET CR) 50-200 MG tablet Take 1 tablet by mouth at bedtime.  . carbidopa-levodopa (SINEMET IR) 25-100 MG tablet Take 1 tablet by mouth 8 times daily. Take 1 to 2 extra tablets daily as needed.  . Carboxymethylcellul-Glycerin (LUBRICATING EYE DROPS OP) Place 1 drop into both eyes 3 (three) times daily as needed (dry eyes).  . clonazePAM (KLONOPIN) 0.5 MG tablet Take 1 tablet by mouth daily at bedtime.  . cloNIDine (CATAPRES) 0.2 MG tablet Take 1 tablet by mouth every morning and, take 2 tablets by mouth every evening. **Please schedule an appointment for additional refills.**  . DULoxetine (CYMBALTA) 60 MG capsule Take 1 capsule (60 mg total) by mouth daily.  Marland Kitchen esomeprazole (NEXIUM) 20 MG capsule Take 40 mg by mouth 2 (two) times daily. 2 tab in morning and 2 tab at night  . gabapentin (NEURONTIN) 300 MG capsule Take 1 capsule by mouth twice daily.  Marland Kitchen MYRBETRIQ 50 MG TB24 tablet Take 1 tablet by mouth daily.   Marland Kitchen oxybutynin (DITROPAN) 5 MG tablet Take 1 tablet by mouth twice daily.  Marland Kitchen senna-docusate (SENOKOT S) 8.6-50 MG tablet Take 2 tablets by mouth at bedtime. (Patient taking differently: Take 2 tablets by mouth daily. 2 tab in morning and 2 at night)  . telmisartan (MICARDIS) 80 MG tablet Take 1 tablet by mouth once daily.  . [DISCONTINUED] azithromycin (ZITHROMAX) 250 MG tablet Take 2 tabs on first day. Then take 1 tab daily. Finish entire supply.  . [DISCONTINUED] benzonatate (TESSALON) 200 MG capsule Take 1 capsule (200 mg total) by mouth 2 (two) times daily as needed for cough.  . [DISCONTINUED] hydrALAZINE  (APRESOLINE) 50 MG tablet TAKE 1 TABLET BY MOUTH 4 TIMES DAILY AS NEEDED FOR ELEVATED BP AND TAKE ONE A NIGHT TIME  . [DISCONTINUED] nystatin (MYCOSTATIN) 100000 UNIT/ML suspension Take 5 mLs (500,000 Units total) by mouth 4 (four) times daily.  . [DISCONTINUED] predniSONE (STERAPRED UNI-PAK 21 TAB) 10 MG (21) TBPK tablet Take per package instructions. Do not skip doses. Finish entire supply.   No facility-administered encounter medications on file as of 02/08/2021.    Objective:   PHYSICAL EXAMINATION:    VITALS:   Vitals:   02/08/21 1425  BP: (!) 152/86  Pulse: 87  SpO2: 98%  Weight: 242 lb 3.2 oz (109.9 kg)  Height: 5\' 8"  (1.727 m)   Wt Readings from Last 3 Encounters:  02/08/21 242 lb 3.2 oz (109.9 kg)  09/18/20 240  lb (108.9 kg)  07/20/20 245 lb (111.1 kg)     GEN:  The patient appears stated age and is in NAD. HEENT:  Normocephalic, atraumatic.  The mucous membranes are moist. The superficial temporal arteries are without ropiness or tenderness. CV:  RRR Lungs:  She has conversational dyspnea. Neck/HEME:  There are no carotid bruits bilaterally.  Neurological examination:  Orientation: The patient is alert and oriented x3. Cranial nerves: There is good facial symmetry with facial hypomimia. The speech is fluent and clear. Soft palate rises symmetrically and there is no tongue deviation. Hearing is intact to conversational tone. Sensation: Sensation is intact to light touch throughout Motor: Strength is at least antigravity x4.  Movement examination: Tone: There is normal tone in the upper and lower extremities. Abnormal movements: Mild lower extremity dyskinesia. Coordination:  There is  decremation with RAM's, with any form of RAMS, including alternating supination and pronation of the forearm, hand opening and closing, finger taps, heel taps and toe taps, R>>L Gait and Station: The patient pushes off of her Rollator to arise.  She is flexed at the waist.  She  ambulates with her walker.  She is short stepped.  She drags the R leg.  I have reviewed and interpreted the following labs independently   Chemistry      Component Value Date/Time   NA 141 02/18/2020 1617   K 4.7 02/18/2020 1617   CL 102 02/18/2020 1617   CO2 24 02/18/2020 1617   BUN 15 02/18/2020 1617   CREATININE 1.09 (H) 02/18/2020 1617   CREATININE 0.91 04/23/2016 0913      Component Value Date/Time   CALCIUM 9.3 02/18/2020 1617   ALKPHOS 123 (H) 02/18/2020 1617   AST 14 02/18/2020 1617   ALT 7 02/18/2020 1617   BILITOT 0.3 02/18/2020 1617     Lab Results  Component Value Date   TSH 0.533 02/18/2020     Total time spent on today's visit was 31 minutes, including both face-to-face time and nonface-to-face time.  Time included that spent on review of records (prior notes available to me/labs/imaging if pertinent), discussing treatment and goals, answering patient's questions and coordinating care.  Cc:  Gaynelle Arabian, MD

## 2021-02-08 ENCOUNTER — Encounter: Payer: Self-pay | Admitting: Neurology

## 2021-02-08 ENCOUNTER — Ambulatory Visit: Payer: Medicare Other | Admitting: Neurology

## 2021-02-08 ENCOUNTER — Other Ambulatory Visit: Payer: Self-pay

## 2021-02-08 VITALS — BP 152/86 | HR 87 | Ht 68.0 in | Wt 242.2 lb

## 2021-02-08 DIAGNOSIS — G238 Other specified degenerative diseases of basal ganglia: Secondary | ICD-10-CM

## 2021-02-08 DIAGNOSIS — G5131 Clonic hemifacial spasm, right: Secondary | ICD-10-CM

## 2021-03-01 ENCOUNTER — Ambulatory Visit: Payer: Medicare Other | Admitting: Psychology

## 2021-03-02 ENCOUNTER — Other Ambulatory Visit: Payer: Self-pay | Admitting: Neurology

## 2021-03-06 ENCOUNTER — Ambulatory Visit: Payer: Medicare Other | Admitting: Psychology

## 2021-03-22 ENCOUNTER — Other Ambulatory Visit: Payer: Self-pay | Admitting: Neurology

## 2021-03-31 ENCOUNTER — Other Ambulatory Visit: Payer: Self-pay | Admitting: Neurology

## 2021-04-02 NOTE — Telephone Encounter (Signed)
Dr. Carles Collet patient

## 2021-04-05 ENCOUNTER — Ambulatory Visit: Payer: Medicare Other | Admitting: Psychology

## 2021-04-10 ENCOUNTER — Other Ambulatory Visit: Payer: Self-pay | Admitting: Neurology

## 2021-04-10 NOTE — Telephone Encounter (Signed)
Patient called and said she needs prescriptions sent to the pharmacy below for:   Gabapentin 300 MG Carbidopa levodopa (? Dosage) Amantadine 100 MG Clonazepam .5MG   [employer] Pillpack 90 day increments

## 2021-04-11 DIAGNOSIS — K1379 Other lesions of oral mucosa: Secondary | ICD-10-CM | POA: Diagnosis not present

## 2021-04-13 ENCOUNTER — Other Ambulatory Visit: Payer: Self-pay

## 2021-04-13 ENCOUNTER — Ambulatory Visit: Payer: Medicare Other | Admitting: Neurology

## 2021-04-13 DIAGNOSIS — G5131 Clonic hemifacial spasm, right: Secondary | ICD-10-CM | POA: Diagnosis not present

## 2021-04-13 MED ORDER — ONABOTULINUMTOXINA 100 UNITS IJ SOLR
15.0000 [IU] | Freq: Once | INTRAMUSCULAR | Status: AC
  Administered 2021-04-13: 15 [IU] via INTRAMUSCULAR

## 2021-04-13 NOTE — Procedures (Signed)
[facility]   History:  Diagnosis: Hemifacial spasm   Initial side: right   Result History  Reporting R eye "slamming shut" but intermittently, esp with reading.  Sometimes cannot open eye.  No evidence of ptosis.  Didn't see hemifacial spasm.  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  Risk discussed included, but were not limited pain and discomfort, bleeding, bruising, excessive weakness, venous thrombosis, muscle atrophy and dysphagia.  A copy of the patient medication guide was given to the patient which explains the blackbox warning.  Patients identity and treatment sites confirmed Yes.  .  Details of Procedure: Skin was cleaned with alcohol.  A 30 gauge, 1/2 inch needle was introduced to target mm.  Prior to injection, the needle plunger was aspirated to make sure the needle was not within a blood vessel.  There was no blood retrieved on aspiration.    Following is a summary of the muscles injected  And the amount of Botulinum toxin used:  Injections  Location Left  Right Units Number of sites        Corrugator      Frontalis      Lower Lid, Lateral  2.5 2.5   Lower Lid Medial      Upper Lid, Lateral  2.5 2.5   Upper Lid, Medial      Canthus 2.5 5.0 7.0   nasalis  2.5 2.5   Masseter      Procerus      Zygomaticus Major      TOTAL UNITS:   15    Agent: Botulinum Type A ( Onobotulinum Toxin type A ).  1 vials of Botox were used, each containing 50 units and freshly diluted with 2 mL of sterile, non-perserved saline   Total injected (Units): 15  Total wasted (Units):0 Pt tolerated procedure well without complications.   Reinjection is anticipated in 3 months.

## 2021-04-13 NOTE — Telephone Encounter (Signed)
Pharmacy tech, sidney is calling again regarding the medicine refills. Please call [phone]

## 2021-04-17 MED ORDER — GABAPENTIN 300 MG PO CAPS: 1.0000 | ORAL_CAPSULE | Freq: Two times a day (BID) | ORAL | 0 refills | Status: DC

## 2021-04-17 MED ORDER — AMANTADINE HCL 100 MG PO CAPS: ORAL_CAPSULE | ORAL | 0 refills | Status: DC

## 2021-04-17 MED ORDER — CLONAZEPAM 0.5 MG PO TABS
0.5000 mg | ORAL_TABLET | Freq: Every day | ORAL | 2 refills | Status: DC
Start: 2021-04-17 — End: 2021-04-18

## 2021-04-17 MED ORDER — CARBIDOPA-LEVODOPA ER 50-200 MG PO TBCR: 1.0000 | EXTENDED_RELEASE_TABLET | Freq: Every day | ORAL | 0 refills | Status: DC

## 2021-04-17 MED ORDER — CARBIDOPA-LEVODOPA 25-100 MG PO TABS: ORAL_TABLET | ORAL | 0 refills | Status: DC

## 2021-04-18 MED ORDER — CLONAZEPAM 0.5 MG PO TABS: 0.5000 mg | ORAL_TABLET | Freq: Every day | ORAL | 5 refills | Status: DC

## 2021-04-18 NOTE — Addendum Note (Signed)
Addended by: Armen Pickup A on: 04/18/2021 08:23 AM   Modules accepted: Orders

## 2021-04-18 NOTE — Addendum Note (Signed)
Addended by: Ludwig Clarks on: 04/18/2021 08:40 AM   Modules accepted: Orders

## 2021-04-26 DIAGNOSIS — H5211 Myopia, right eye: Secondary | ICD-10-CM | POA: Diagnosis not present

## 2021-05-01 DIAGNOSIS — E7800 Pure hypercholesterolemia, unspecified: Secondary | ICD-10-CM | POA: Diagnosis not present

## 2021-05-01 DIAGNOSIS — Z79899 Other long term (current) drug therapy: Secondary | ICD-10-CM | POA: Diagnosis not present

## 2021-05-03 ENCOUNTER — Ambulatory Visit: Payer: Medicare Other | Admitting: Psychology

## 2021-06-20 ENCOUNTER — Other Ambulatory Visit: Payer: Self-pay

## 2021-06-20 MED ORDER — GABAPENTIN 300 MG PO CAPS: 300.0000 mg | ORAL_CAPSULE | Freq: Two times a day (BID) | ORAL | 0 refills | Status: DC

## 2021-07-25 ENCOUNTER — Other Ambulatory Visit: Payer: Self-pay | Admitting: Neurology

## 2021-08-02 ENCOUNTER — Other Ambulatory Visit: Payer: Self-pay

## 2021-08-02 ENCOUNTER — Ambulatory Visit: Payer: Medicare Other | Admitting: Neurology

## 2021-08-02 DIAGNOSIS — G5131 Clonic hemifacial spasm, right: Secondary | ICD-10-CM | POA: Diagnosis not present

## 2021-08-02 MED ORDER — ONABOTULINUMTOXINA 100 UNITS IJ SOLR
15.0000 [IU] | Freq: Once | INTRAMUSCULAR | Status: AC
  Administered 2021-08-02: 15 [IU] via INTRAMUSCULAR

## 2021-08-02 NOTE — Progress Notes (Signed)
[facility]   History:  Diagnosis: Hemifacial spasm   Initial side: right   Result History  No SE.  Eyes closing lately.  Having illusions as well d/t disease  Consent obtained from: The patient Benefits discussed included, but were not limited to decreased muscle tightness, increased joint range of motion, and decreased pain.  Risk discussed included, but were not limited pain and discomfort, bleeding, bruising, excessive weakness, venous thrombosis, muscle atrophy and dysphagia.  A copy of the patient medication guide was given to the patient which explains the blackbox warning.  Patients identity and treatment sites confirmed Yes.  .  Details of Procedure: Skin was cleaned with alcohol.  A 30 gauge, 1/2 inch needle was introduced to target mm.  Prior to injection, the needle plunger was aspirated to make sure the needle was not within a blood vessel.  There was no blood retrieved on aspiration.    Following is a summary of the muscles injected  And the amount of Botulinum toxin used:  Injections  Location Left  Right Units Number of sites        Corrugator      Frontalis      Lower Lid, Lateral  2.5 2.5   Lower Lid Medial      Upper Lid, Lateral  2.5 2.5   Upper Lid, Medial      Canthus 2.5 5.0 7.0   nasalis  2.5 2.5   Masseter      Procerus      Zygomaticus Major      TOTAL UNITS:   15    Agent: Botulinum Type A ( Onobotulinum Toxin type A ).  1 vials of Botox were used, each containing 50 units and freshly diluted with 2 mL of sterile, non-perserved saline   Total injected (Units): 15  Total wasted (Units):0 Pt tolerated procedure well without complications.   Reinjection is anticipated in 3 months.

## 2021-08-10 ENCOUNTER — Ambulatory Visit: Payer: Medicare Other | Admitting: Neurology

## 2021-08-16 NOTE — Progress Notes (Signed)
Assessment/Plan:   1.  MSA             -Continue carbidopa/levodopa 25/100, 1 tablet 8 times per day.  She generally takes this about every 2-3 hours             -Continue carbidopa/levodopa 50/200 at bedtime  2.  Hemifacial spasm, right             -Doing well with Botox.  The other mouth movements that she describes are likely dyskinesia.  She and I discussed this today. 3.  Dysphagia.             -She had a MBE on November 19, 2017.  This was unremarkable, with the exception of the tablet of barium lodged at the distal esophagus at one point.  Mechanical soft diet with thin liquids as recommended.   4.  Urinary incontinence             -saw urology and on oxybutynin.  Doing better on Myrbetriq, but having extreme dry mouth.   5.  RBD             -better with klonopin, 0.5 mg at night. 6.  Depression, mild             -She will continue Cymbalta, 60 mg daily 7.  Leg and feet pain  -Has followed with Dr. Letta Pate in the past.  Had lumbar medial branch blocks in July, but that seemed to make pain worse and she decided to hold off on further injections.  For now, she does not want to go back, primarily because of logistical issues getting back and forth  -On gabapentin, 300 mg, 1 capsule twice per day.  The gabapentin has helped the neuropathic pain.  She is also on Cymbalta, 60 mg daily, as above. 8.  SOB  -discussed pulm referral but she doesn't want to do that.  She is tired of going to more physicians. 9.  Parkinson's dyskinesia  -Continue amantadine, 100 mg 3 times per day   Subjective:   Jacqueline Atkinson was seen today in follow up for MSA.  She continues to do Botox for hemifacial spasm and that seems to be working most of the time.  Last injections were October 6.  She tells me she went and rid in a hot air balloon since our last visit.  She really enjoyed it.  Overall, states that she is doing pretty well and really having a string of good days.  States that her  leg/feet/arthritic pain continues and she saw some ad on FB for patches - it was $300 for a box of patches.  She ultimately decided to return to the patches.  She had some skin irritation.  No falls.  No hallucinations.  Saw Dr. Valetta Close - told that cataract sx wouldn't help.  States that she feels that "all of my senses are failing except touch and hearing."  Current prescribed movement disorder medications: carbidopa/levodopa 25/100, 2 at 9am/1 at 11am/2 at 1pm/1 at 3pm/1 at 5pm/1 at 7pm Carbidopa/levodopa 50/200 at bedtime Clonazepam 0.5 mg at bedtime Cymbalta, 60 mg daily Gabapentin, 300 mg, 1 capsule twice per day  Amantadine, 100 mg 3 times per day   PREVIOUS MEDICATIONS: carbidopa/levodopa 25/100 CR (didn't help changing to it);    ALLERGIES:   Allergies  Allergen Reactions   Sulfa Antibiotics     Long time ago and does not remember reaction    CURRENT MEDICATIONS:  Outpatient Encounter  Medications as of 08/20/2021  Medication Sig   acetaminophen (TYLENOL) 500 MG tablet Take 1,000 mg by mouth every 8 (eight) hours as needed for moderate pain.    amantadine (SYMMETREL) 100 MG capsule Take 1 capsule by mouth three times daily in the morning, at noon, and at bedtime.   AMBULATORY NON FORMULARY MEDICATION Wheelchair Ramp  DX: G23.8   atenolol (TENORMIN) 100 MG tablet Take 1 tablet (100 mg) by mouth once daily.   carbidopa-levodopa (SINEMET CR) 50-200 MG tablet Take 1 tablet by mouth at bedtime.   carbidopa-levodopa (SINEMET IR) 25-100 MG tablet 1 po q 2-3 hours 8 times per day   Carboxymethylcellul-Glycerin (LUBRICATING EYE DROPS OP) Place 1 drop into both eyes 3 (three) times daily as needed (dry eyes).   clonazePAM (KLONOPIN) 0.5 MG tablet Take 1 tablet (0.5 mg total) by mouth at bedtime.   cloNIDine (CATAPRES) 0.2 MG tablet Take 1 tablet by mouth every morning and, take 2 tablets by mouth every evening. **Please schedule an appointment for additional refills.**   DULoxetine  (CYMBALTA) 30 MG capsule Take 30 mg by mouth daily.   DULoxetine (CYMBALTA) 60 MG capsule Take 1 capsule (60 mg total) by mouth daily.   esomeprazole (NEXIUM) 20 MG capsule Take 40 mg by mouth 2 (two) times daily. 2 tab in morning and 2 tab at night   gabapentin (NEURONTIN) 300 MG capsule Take 1 capsule (300 mg total) by mouth 2 (two) times daily.   MYRBETRIQ 50 MG TB24 tablet Take 1 tablet by mouth daily.    oxybutynin (DITROPAN) 5 MG tablet Take 1 tablet by mouth twice daily.   rosuvastatin (CRESTOR) 10 MG tablet Take 10 mg by mouth at bedtime.   senna-docusate (SENOKOT S) 8.6-50 MG tablet Take 2 tablets by mouth at bedtime. (Patient taking differently: Take 2 tablets by mouth daily. 2 tab in morning and 2 at night)   telmisartan (MICARDIS) 80 MG tablet Take 1 tablet by mouth once daily.   [DISCONTINUED] carbidopa-levodopa (SINEMET IR) 25-100 MG tablet Take 1 tablet by mouth 8 times daily. Take 1 to 2 extra tablets daily as needed.   No facility-administered encounter medications on file as of 08/20/2021.    Objective:   PHYSICAL EXAMINATION:    VITALS:   Vitals:   08/20/21 1341  BP: (!) 125/58  Pulse: 78  SpO2: 98%  Weight: 231 lb (104.8 kg)  Height: 5\' 7"  (1.702 m)    Wt Readings from Last 3 Encounters:  08/20/21 231 lb (104.8 kg)  02/08/21 242 lb 3.2 oz (109.9 kg)  09/18/20 240 lb (108.9 kg)     GEN:  The patient appears stated age and is in NAD. HEENT:  Normocephalic, atraumatic.  The mucous membranes are moist. The superficial temporal arteries are without ropiness or tenderness. CV:  RRR Lungs:  She has conversational dyspnea (stable) Neck/HEME:  There are no carotid bruits bilaterally.  Neurological examination:  Orientation: The patient is alert and oriented x3. Cranial nerves: There is good facial symmetry with facial hypomimia. The speech is fluent and clear. Soft palate rises symmetrically and there is no tongue deviation. Hearing is intact to conversational  tone. Sensation: Sensation is intact to light touch throughout Motor: Strength is at least antigravity x4.  Movement examination: Tone: There is normal tone in the upper and lower extremities. Abnormal movements: Mild lower extremity dyskinesia (same as previous). Coordination:  There is  decremation with RAM's, with any form of RAMS, including alternating supination and pronation  of the forearm, hand opening and closing, finger taps, heel taps and toe taps, R>>L   I have reviewed and interpreted the following labs independently   Chemistry      Component Value Date/Time   NA 141 02/18/2020 1617   K 4.7 02/18/2020 1617   CL 102 02/18/2020 1617   CO2 24 02/18/2020 1617   BUN 15 02/18/2020 1617   CREATININE 1.09 (H) 02/18/2020 1617   CREATININE 0.91 04/23/2016 0913      Component Value Date/Time   CALCIUM 9.3 02/18/2020 1617   ALKPHOS 123 (H) 02/18/2020 1617   AST 14 02/18/2020 1617   ALT 7 02/18/2020 1617   BILITOT 0.3 02/18/2020 1617     Lab Results  Component Value Date   TSH 0.533 02/18/2020     Total time spent on today's visit was 30 minutes, including both face-to-face time and nonface-to-face time.  Time included that spent on review of records (prior notes available to me/labs/imaging if pertinent), discussing treatment and goals, answering patient's questions and coordinating care.  Cc:  Gaynelle Arabian, MD

## 2021-08-20 ENCOUNTER — Ambulatory Visit: Payer: Medicare Other | Admitting: Neurology

## 2021-08-20 ENCOUNTER — Other Ambulatory Visit: Payer: Self-pay

## 2021-08-20 ENCOUNTER — Encounter: Payer: Self-pay | Admitting: Neurology

## 2021-08-20 VITALS — BP 125/58 | HR 78 | Ht 67.0 in | Wt 231.0 lb

## 2021-08-20 DIAGNOSIS — G238 Other specified degenerative diseases of basal ganglia: Secondary | ICD-10-CM

## 2021-08-20 DIAGNOSIS — G5131 Clonic hemifacial spasm, right: Secondary | ICD-10-CM

## 2021-08-20 DIAGNOSIS — G20 Parkinson's disease: Secondary | ICD-10-CM

## 2021-08-20 DIAGNOSIS — G249 Dystonia, unspecified: Secondary | ICD-10-CM | POA: Diagnosis not present

## 2021-08-20 MED ORDER — CARBIDOPA-LEVODOPA 25-100 MG PO TABS: ORAL_TABLET | ORAL | 1 refills | Status: DC

## 2021-08-20 NOTE — Patient Instructions (Signed)
Multiple System Atrophy Multiple system atrophy (MSA) is a rare disease of the brain and spinal cord (central nervous system) that causes a loss of nerve cells. Atrophy means loss of function. This condition used to be called Shy-Drager syndrome. Multiple system atrophy affects one or both of the following areas of the central nervous system: Autonomic nervous system, which controls functions such as blood pressure, heart rate, and bladder function. Motor system, which controls balance and muscle movements. Symptoms tend to get worse over time. There is no cure, but treatment may help control some of the symptoms. What are the causes? The cause of MSA is not known. What are the signs or symptoms? Symptoms of this condition depend on which part of your nervous system is affected. You may have symptoms that mainly affect either your autonomic or motor system, or you might have a combination of symptoms that affect both. Symptoms of autonomic nervous system disease include: Low blood pressure when standing (orthostatic hypotension). This can cause dizziness or fainting. Bladder control problems (incontinence). Difficulty passing stool (constipation). Difficulty getting an erection (erectile dysfunction). Dry mouth and skin. Irregular heartbeat. Symptoms of motor system disease include: Rigid muscles. Slowed movement. Poor balance. Trembling or tremor. Clumsiness. Trouble speaking. Double vision. Trouble chewing and swallowing. Trouble breathing. How is this diagnosed? At first, Jacqueline Atkinson can seem like other nervous system disorders. Because of this, it may take a long time to get a diagnosis of MSA. There is no single test that can be used to diagnose this condition. You may be referred to a specialist in nervous system diseases (neurologist). A neurologist may diagnose MSA by doing a physical and neurological exam and by evaluating your symptoms that get worse over time. Imaging studies of the  brain may also be done, including: MRI. PET scan. DaTscan. This is a test that uses a radioactive substance to see specific cells in the brain. How is this treated? No treatments can cure or slow the progression of MSA. The goal of treatment is to manage symptoms. Treatment may include: Medicine to improve symptoms that affect your motor system. Medicine to support blood pressure control. Medicine to relieve constipation and control incontinence. A feeding tube if swallowing becomes difficult. Physical therapy to keep muscles healthy for as long as possible. A walker, wheelchair, or other assistive devices if walking becomes difficult. Follow these instructions at home: Eating and drinking  Increase the amount of fiber in your diet to help prevent constipation. Increase the amount of salt in your diet as directed by your health care provider. This can help prevent orthostatic hypotension. Drink more fluid if your health care provider recommends. This will help with both constipation and low blood pressure. Do not drink alcohol. Lifestyle  Sleep with the head of your bed slightly raised. This may reduce orthostatic hypotension in the morning. Talk with your health care provider about using compression stockings. This might help with your blood pressure. Stay as active as you can. Ask your health care provider to recommend a safe level of exercise for you. Do not use any products that contain nicotine or tobacco, such as cigarettes and e-cigarettes. If you need help quitting, ask your health care provider. General instructions Work closely with your health care providers. MSA is a progressive disease that will get worse over time. Take over-the-counter and prescription medicines only as told by your health care provider. Take steps to reduce the risk of falls at home. Make sure that you have a good support  system at home. Ask about home care assistance if needed. Ask your health care  provider about receiving palliative care to help manage symptoms and support your family members and loved ones. Keep all follow-up visits as told by your health care provider. This is important. Contact a health care provider if: Your symptoms change or get worse. You need more support at home. Get help right away if you: Have an injury from a fainting spell. Choke when you try to swallow food or fluids. Have trouble breathing. Have chest pain. These symptoms may represent a serious problem that is an emergency. Do not wait to see if the symptoms will go away. Get medical help right away. Call your local emergency services (911 in the [place] Do not drive yourself to the hospital. Summary Multiple system atrophy is a rare disease of the brain and spinal cord (central nervous system). It causes a loss of nerve cells. Symptoms of this condition depend on which part of your nervous system is involved. You may have symptoms that mainly affect either your autonomic or motor system, or you might have a combination of symptoms that affect both. Treatment depends on your symptoms. It may include medicines to support blood pressure, medicines for urinary incontinence or constipation, and physical therapy to help keep muscles strong. Follow your health care provider's instructions about taking medicines, eating specific foods, preventing falls at home, and when to get medical help. This information is not intended to replace advice given to you by your health care provider. Make sure you discuss any questions you have with your health care provider. Document Revised: 10/29/2017 Document Reviewed: 10/29/2017 Elsevier Patient Education  2022 [name].

## 2021-09-12 ENCOUNTER — Telehealth: Payer: Self-pay

## 2021-09-12 NOTE — Telephone Encounter (Signed)
New message - [employer]   Benefit Verification [id number] Submitted! For BV Basic submissions, please allow 24 hours for results.  For BV Full submissions, please allow 24-48 hours for results.

## 2021-09-12 NOTE — Telephone Encounter (Signed)
New message - website Covermymeds  Your information has been submitted to [place]. [employer] [place] will review the request and notify you of the determination decision directly, typically within 3 business days of your submission and once all necessary information is received.  You will also receive your request decision electronically. To check for an update later, open the request again from your dashboard.  If [employer] has not responded within the specified timeframe or if you have any questions about your PA submission, contact Rebecca Woodlake directly at [facility]) [phone] or ([place]) [phone].  Brittani Broadfoot Key: [id number] help? Call us at [phone] Status Sent to Plan today Drug Botox 100UNIT solution Form [employer] Cross  Medicare Part D Electronic Request Form (CB)

## 2021-09-12 NOTE — Telephone Encounter (Signed)
F/u   Jacqueline Atkinson Key: [id number] help? Call us at [phone] Outcome Approved today Effective from 09/12/2021 through 09/12/2022. Drug Botox 100UNIT solution Form [employer] Medicare Part D Electronic Request Form (CB)

## 2021-09-14 ENCOUNTER — Other Ambulatory Visit: Payer: Self-pay

## 2021-09-14 DIAGNOSIS — G5131 Clonic hemifacial spasm, right: Secondary | ICD-10-CM

## 2021-09-14 MED ORDER — BOTOX 100 UNITS IJ SOLR
100.0000 [IU] | Freq: Once | INTRAMUSCULAR | 1 refills | Status: AC
Start: 2021-09-14 — End: 2021-09-14

## 2021-09-23 ENCOUNTER — Other Ambulatory Visit: Payer: Self-pay | Admitting: Neurology

## 2021-09-24 ENCOUNTER — Other Ambulatory Visit: Payer: Self-pay

## 2021-11-02 ENCOUNTER — Ambulatory Visit: Payer: Medicare Other | Admitting: Neurology

## 2021-11-16 ENCOUNTER — Other Ambulatory Visit: Payer: Self-pay

## 2021-11-16 ENCOUNTER — Ambulatory Visit: Payer: Medicare Other | Admitting: Neurology

## 2021-11-16 DIAGNOSIS — G5131 Clonic hemifacial spasm, right: Secondary | ICD-10-CM

## 2021-11-16 NOTE — Procedures (Signed)
[facility]   History:  Diagnosis: Hemifacial spasm   Initial side: right   Result History  No SE.  R eye wanting to close  Consent obtained from: The patient Benefits discussed included, but were not limited to decreased muscle tightness, increased joint range of motion, and decreased pain.  Risk discussed included, but were not limited pain and discomfort, bleeding, bruising, excessive weakness, venous thrombosis, muscle atrophy and dysphagia.  A copy of the patient medication guide was given to the patient which explains the blackbox warning.  Patients identity and treatment sites confirmed Yes.  .  Details of Procedure: Skin was cleaned with alcohol.  A 30 gauge, 1/2 inch needle was introduced to target mm.  Prior to injection, the needle plunger was aspirated to make sure the needle was not within a blood vessel.  There was no blood retrieved on aspiration.    Following is a summary of the muscles injected  And the amount of Botulinum toxin used:  Injections  Location Left  Right Units Number of sites        Corrugator      Frontalis      Lower Lid, Lateral  2.5 2.5   Lower Lid Medial      Upper Lid, Lateral  2.5 2.5   Upper Lid, Medial      Canthus 2.5 5.0 7.0   nasalis  2.5 2.5   Masseter      Procerus      Zygomaticus Major      TOTAL UNITS:   15    Agent: Botulinum Type A ( Onobotulinum Toxin type A ).  1 vials of Botox were used, each containing 50 units and freshly diluted with 2 mL of sterile, non-perserved saline   Total injected (Units): 15  Total wasted (Units):85 Pt tolerated procedure well without complications.   Reinjection is anticipated in 3 months.

## 2021-12-15 NOTE — Progress Notes (Signed)
Telemedicine Encounter- SOAP NOTE Established Patient  This telephone encounter was conducted with the patient's (or proxy's) verbal consent via audio telecommunications: yes/no: Yes Patient was instructed to have this encounter in a suitably private space; and to only have persons present to whom they give permission to participate. In addition, patient identity was confirmed by use of name plus two identifiers (DOB and address).  I discussed the limitations, risks, security and privacy concerns of performing an evaluation and management service by telephone and the availability of in person appointments. I also discussed with the patient that there may be a patient responsible charge related to this service. The patient expressed understanding and agreed to proceed.  I spent a total of 17 minutes talking with the patient or their proxy.  Patient at home Provider in office  Participants: Kathrin Ruddy, NP and [place]  Chief Complaint  Patient presents with   Generalized Body Aches    Pt reports very intense body aches, wheezing, productive cough, pt is checking her O2 at home, pt does not want a covid test as she says it makes no difference to her she is already isolated just wants advice what to do to improve.     Subjective   Jacqueline Atkinson is a 66 y.o. established patient. Telephone visit today for flu like symptoms  HPI Onset 3-4 days ago Stable Productive cough, O2 stable Aches and pains Complicated medical history as noted in chart - she is already isolated due to fears of covid complications. Declines testing.  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 use OTC Mucinex or Antihistamine.   Hyperlipidemia    Hypertension    Neuromuscular disorder ([place])    "multi system atrophy disease"- "pain, bilateral foot numbness, left knee pain"- Dr. Carles Collet is following.   Ovarian cancer on left ([place]) 2019   serous borderline s/p BSO   Pneumonia    Shoulder disorder    right shoulder "rigidity due to Multi system atrophy"- ROM not limited"just tires me"    Current Outpatient Medications  Medication Sig Dispense Refill   acetaminophen (TYLENOL) 500 MG tablet Take 1,000 mg by mouth every 8 (eight) hours as needed for moderate pain.      amantadine (SYMMETREL) 100 MG capsule Take 1 capsule by mouth three times daily in the morning, at noon, and at bedtime. 270 capsule 0   AMBULATORY NON FORMULARY MEDICATION Wheelchair Ramp  DX: G23.8 1 Device 0   atenolol (TENORMIN) 100 MG tablet Take 1 tablet (100 mg) by mouth once daily. 90 tablet 1   carbidopa-levodopa (SINEMET CR) 50-200 MG tablet Take 1 tablet by mouth at bedtime. 90 tablet 0   carbidopa-levodopa (SINEMET IR) 25-100 MG tablet 1 po q 2-3 hours 8 times per day 720 tablet 1   Carboxymethylcellul-Glycerin (LUBRICATING EYE DROPS OP) Place 1 drop into both eyes 3 (three) times daily as needed (dry eyes).     clonazePAM (KLONOPIN) 0.5 MG tablet Take  1 tablet by mouth daily at bedtime. 90 tablet 1   cloNIDine (CATAPRES) 0.2 MG tablet Take 1 tablet by mouth every morning and, take 2 tablets by mouth every evening. **Please schedule an appointment for additional refills.** 180 tablet 0   DULoxetine (CYMBALTA) 30 MG capsule Take 30 mg by mouth daily.     DULoxetine (CYMBALTA) 60 MG capsule Take 1 capsule (60 mg total) by mouth daily. 90 capsule 0   esomeprazole (NEXIUM) 20 MG capsule Take 40 mg by mouth 2 (two) times daily. 2 tab in morning and 2 tab at night     gabapentin (NEURONTIN) 300 MG capsule Take 1 capsule by mouth twice daily. 180 capsule 0   MYRBETRIQ 50 MG TB24 tablet Take 1 tablet by mouth daily.  90 tablet 2   oxybutynin (DITROPAN) 5 MG tablet Take 1 tablet by mouth twice daily. 180 tablet 2   rosuvastatin (CRESTOR) 10 MG tablet Take 10 mg by mouth at bedtime.     senna-docusate (SENOKOT S) 8.6-50 MG tablet Take 2 tablets by mouth at bedtime. (Patient taking differently: Take 2 tablets by mouth daily. 2 tab in morning and 2 at night)     telmisartan (MICARDIS) 80 MG tablet Take 1 tablet by mouth once daily. 90 tablet 0   No current facility-administered medications for this visit.    Allergies  Allergen Reactions   Sulfa Antibiotics     Long time ago and does not remember reaction    Social History   Socioeconomic History   Marital status: Divorced    Spouse name: Not on file   Number of children: 7   Years of education: 12   Highest education level: High school graduate  Occupational History   Occupation: ACTIVITY COORDINATOR    Employer: [employer]  Tobacco Use   Smoking status: Former    Packs/day: 1.00    Years: 12.00    Pack years: 12.00    Types: Cigarettes    Quit date: 03/14/1988    Years since quitting: 33.7   Smokeless tobacco: Never  Vaping Use   Vaping Use: Never used  Substance and Sexual Activity   Alcohol use: No   Drug use: No    Comment: edible ebrownies  months ago for  pain   Sexual activity: Not Currently  Other Topics Concern   Not on file  Social History Narrative   Right handed   One story home   Drinks caffeine on occasionally   Social Determinants of Health   Financial Resource Strain: Not on file  Food Insecurity: Not on file  Transportation Needs: Not on file  Physical Activity: Not on file  Stress: Not on file  Social Connections: Not on file  Intimate Partner Violence: Not on file    ROS Per hpi   Objective   Vitals as reported by the patient: There were no vitals filed for this visit.  Paytin was  seen today for generalized body aches.  Diagnoses and all orders for this visit:  Cough -     Discontinue: benzonatate (TESSALON) 200 MG capsule; Take 1 capsule (200 mg total) by mouth 2 (two) times daily as needed for cough. -     Discontinue: azithromycin (ZITHROMAX) 250 MG tablet; Take 2 tabs on first day. Then take 1 tab daily. Finish entire supply. -     Discontinue: predniSONE (STERAPRED UNI-PAK 21 TAB) 10 MG (21) TBPK tablet; Take per package instructions. Do not skip doses. Finish entire supply.   PLAN Given her history, will treat with abundance of caution and start on zpack, predniosne, and tessalon for relief Recommend OTC analgesics around the clock Reviewed reasons to present to ER. Pt voices understanding Patient encouraged to call clinic with any questions, comments, or concerns.   I discussed the assessment and treatment plan with the patient. The patient was provided an opportunity to ask questions and all were answered. The patient agreed with the plan and demonstrated an understanding of the instructions.   The patient was advised to call back or seek an in-person evaluation if the symptoms worsen or if the condition fails to improve as anticipated.  I provided 17 minutes of non-face-to-face time during this encounter.  Maximiano Coss, NP  Primary Care at [facility]

## 2021-12-17 ENCOUNTER — Other Ambulatory Visit: Payer: Self-pay | Admitting: Neurology

## 2021-12-17 DIAGNOSIS — G20 Parkinson's disease: Secondary | ICD-10-CM

## 2021-12-17 DIAGNOSIS — R202 Paresthesia of skin: Secondary | ICD-10-CM

## 2022-01-22 DIAGNOSIS — R7309 Other abnormal glucose: Secondary | ICD-10-CM | POA: Diagnosis not present

## 2022-01-22 DIAGNOSIS — E7800 Pure hypercholesterolemia, unspecified: Secondary | ICD-10-CM | POA: Diagnosis not present

## 2022-01-22 DIAGNOSIS — I10 Essential (primary) hypertension: Secondary | ICD-10-CM | POA: Diagnosis not present

## 2022-01-22 DIAGNOSIS — G903 Multi-system degeneration of the autonomic nervous system: Secondary | ICD-10-CM | POA: Diagnosis not present

## 2022-02-14 ENCOUNTER — Ambulatory Visit: Payer: Medicare Other | Admitting: Neurology

## 2022-02-15 ENCOUNTER — Ambulatory Visit: Payer: Medicare Other | Admitting: Neurology

## 2022-02-15 DIAGNOSIS — G5131 Clonic hemifacial spasm, right: Secondary | ICD-10-CM

## 2022-02-15 MED ORDER — ONABOTULINUMTOXINA 100 UNITS IJ SOLR
100.0000 [IU] | Freq: Once | INTRAMUSCULAR | Status: AC
  Administered 2022-02-15: 100 [IU] via INTRAMUSCULAR

## 2022-02-15 NOTE — Procedures (Signed)
[facility]  ? ?History:  ?Diagnosis: Hemifacial spasm  ? ?Initial side: right  ? ?Result History  ?No SE.  R eye wanting to close ? ?Consent obtained from: The patient ?Benefits discussed included, but were not limited to decreased muscle tightness, increased joint range of motion, and decreased pain.  Risk discussed included, but were not limited pain and discomfort, bleeding, bruising, excessive weakness, venous thrombosis, muscle atrophy and dysphagia.  A copy of the patient medication guide was given to the patient which explains the blackbox warning. ? ?Patients identity and treatment sites confirmed Yes.  . ? ?Details of Procedure: ?Skin was cleaned with alcohol.  A 30 gauge, 1/2 inch needle was introduced to target mm.  Prior to injection, the needle plunger was aspirated to make sure the needle was not within a blood vessel.  There was no blood retrieved on aspiration.   ? ?Following is a summary of the muscles injected  And the amount of Botulinum toxin used: ? ?Injections  ?Location Left  Right Units Number of sites  ?      ?Corrugator      ?Frontalis      ?Lower Lid, Lateral  2.5 2.5   ?Lower Lid Medial      ?Upper Lid, Lateral  2.5 2.5   ?Upper Lid, Medial      ?Canthus 2.5 5.0 7.0   ?nasalis  2.5 2.5   ?Masseter      ?Procerus      ?Zygomaticus Major      ?TOTAL UNITS:   15   ? ?Agent: Botulinum Type A ( Onobotulinum Toxin type A ).  1 vials of Botox were used, each containing 50 units and freshly diluted with 2 mL of sterile, non-perserved saline ? ? Total injected (Units): 15 ? Total wasted (Units):85 ?Pt tolerated procedure well without complications.   ?Reinjection is anticipated in 3 months. ?

## 2022-02-15 NOTE — Addendum Note (Signed)
Addended by: Patricia Nettle on: 02/15/2022 03:39 PM ? ? Modules accepted: Orders ? ?

## 2022-02-20 ENCOUNTER — Other Ambulatory Visit: Payer: Self-pay

## 2022-02-20 ENCOUNTER — Ambulatory Visit: Payer: Medicare Other | Admitting: Neurology

## 2022-02-20 MED ORDER — CLONAZEPAM 0.5 MG PO TABS: 0.5000 mg | ORAL_TABLET | Freq: Every day | ORAL | 0 refills | Status: DC

## 2022-03-04 ENCOUNTER — Other Ambulatory Visit: Payer: Self-pay | Admitting: Neurology

## 2022-03-04 NOTE — Progress Notes (Signed)
? ? ?Assessment/Plan:  ? ?1.  MSA ?            -Continue carbidopa/levodopa 25/100, 1 tablet 8 times per day.  She generally takes this about every 2-3 hours ?            -Continue carbidopa/levodopa 50/200 at bedtime ? -declines PT today but agreeable to aqua therapy.  Will send order.   ? ?2.  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            -Doing well with Botox.  The other mouth movements that she describes are likely dyskinesia.  She and I discussed this today. ?3.  Dysphagia. ?            -She had a MBE on November 19, 2017.  This was unremarkable, with the exception of the tablet of barium lodged at the distal esophagus at one point.  Mechanical soft diet with thin liquids as recommended.   ?4.  Urinary incontinence, hx (now controlled with med) ?            -saw urology and on oxybutynin.  Doing better on Myrbetriq, but having extreme dry mouth.   ?5.  RBD ?            -better with klonopin, 0.5 mg at night.  Still with day/night reversal though with this ? -add trazodone, 50 mg.  Discussed potential interaction with cymbalta.   ?6.  Depression, mild ?            -She will continue Cymbalta, 60 mg daily ?7.  Leg and feet pain ? -Has followed with Dr. Letta Pate in the past.  Had lumbar medial branch blocks in July, but that seemed to make pain worse and she decided to hold off on further injections.  For now, she does not want to go back, primarily because of logistical issues getting back and forth ? -On gabapentin, 300 mg, 1 capsule twice per day.  The gabapentin has helped the neuropathic pain.  She is also on Cymbalta, 60 mg daily, as above. ?8.  SOB ? -discussed pulm referral but she doesn't want to do that.  She is tired of going to more physicians. ?9.  Parkinson's dyskinesia ? -Continue amantadine, 100 mg 3 times per day ? ? ?Subjective:  ? ?Jacqueline Atkinson was seen today in follow up for MSA.  She continues to do Botox for hemifacial spasm and that  seems to be working most of the time.  However, she has trouble getting her eyes open sometimes on the right.  "Im mystified to how well i'm doing right now."  Has good and bad days, however.  States that she is feeling stronger and more balanced.  Her R leg does drag and few instances of freezing.  Some R hand tremor but not often.  Not lots of dyskinesia.  She notes exhaustion if she does things - went to grandsons play and then exhausted for few days.  She takes nap daily.  She is also getting some day/night reversal.  Waking up toward 11-noon now and up at night.  Under lots of family stress.   ? ?Current prescribed movement disorder medications: ?carbidopa/levodopa 25/100, 2 at 9am/1 at 11am/2 at 1pm/1 at 3pm/1 at 5pm/1 at 7pm ?Carbidopa/levodopa 50/200 at bedtime ?Clonazepam 0.5 mg at bedtime ?Cymbalta, 60 mg daily ?Gabapentin, 300 mg, 1 capsule twice per day  ?Amantadine, 100 mg 3 times per  day  ? ?PREVIOUS MEDICATIONS: carbidopa/levodopa 25/100 CR (didn't help changing to it);   ? ?ALLERGIES:   ?Allergies  ?Allergen Reactions  ? Sulfa Antibiotics   ?  Long time ago and does not remember reaction  ? ? ?CURRENT MEDICATIONS:  ?Outpatient Encounter Medications as of 03/06/2022  ?Medication Sig  ? acetaminophen (TYLENOL) 500 MG tablet Take 1,000 mg by mouth every 8 (eight) hours as needed for moderate pain.   ? amantadine (SYMMETREL) 100 MG capsule Take 1 capsule by mouth three times daily in the morning, at noon, and at bedtime.  ? AMBULATORY NON FORMULARY MEDICATION Wheelchair Ramp  ?DX: G23.8  ? atenolol (TENORMIN) 100 MG tablet Take 1 tablet (100 mg) by mouth once daily.  ? BOTOX 100 units SOLR injection INJECT 100 UNITS INTO THE MUSCLES OF THE HEAD AND NECK ONCE FOR 1 DOSE  ? carbidopa-levodopa (SINEMET CR) 50-200 MG tablet Take 1 tablet by mouth at bedtime.  ? carbidopa-levodopa (SINEMET IR) 25-100 MG tablet 1 po q 2-3 hours 8 times per day  ? Carboxymethylcellul-Glycerin (LUBRICATING EYE DROPS OP) Place 1  drop into both eyes 3 (three) times daily as needed (dry eyes).  ? clonazePAM (KLONOPIN) 0.5 MG tablet Take 1 tablet (0.5 mg total) by mouth at bedtime.  ? cloNIDine (CATAPRES) 0.2 MG tablet Take 1 tablet by mouth every morning and, take 2 tablets by mouth every evening. **Please schedule an appointment for additional refills.**  ? DULoxetine (CYMBALTA) 30 MG capsule Take 30 mg by mouth daily.  ? DULoxetine (CYMBALTA) 60 MG capsule Take 1 capsule (60 mg total) by mouth daily.  ? esomeprazole (NEXIUM) 20 MG capsule Take 40 mg by mouth 2 (two) times daily. 2 tab in morning and 2 tab at night  ? gabapentin (NEURONTIN) 300 MG capsule Take 1 capsule by mouth twice daily.  ? MYRBETRIQ 50 MG TB24 tablet Take 1 tablet by mouth daily.   ? oxybutynin (DITROPAN) 5 MG tablet Take 1 tablet by mouth twice daily.  ? rosuvastatin (CRESTOR) 10 MG tablet Take 10 mg by mouth at bedtime.  ? senna-docusate (SENOKOT S) 8.6-50 MG tablet Take 2 tablets by mouth at bedtime. (Patient taking differently: Take 2 tablets by mouth daily. 2 tab in morning and 2 at night)  ? telmisartan (MICARDIS) 80 MG tablet Take 1 tablet by mouth once daily.  ? ?No facility-administered encounter medications on file as of 03/06/2022.  ? ? ?Objective:  ? ?PHYSICAL EXAMINATION:   ? ?VITALS:   ?Vitals:  ? 03/06/22 0923  ?BP: 124/82  ?Pulse: 68  ?SpO2: 98%  ?Weight: 232 lb 9.6 oz (105.5 kg)  ?Height: '5\' 7"'$  (1.702 m)  ? ? ? ?Wt Readings from Last 3 Encounters:  ?03/06/22 232 lb 9.6 oz (105.5 kg)  ?08/20/21 231 lb (104.8 kg)  ?02/08/21 242 lb 3.2 oz (109.9 kg)  ? ? ? ?GEN:  The patient appears stated age and is in NAD. ?HEENT:  Normocephalic, atraumatic.  The mucous membranes are moist. The superficial temporal arteries are without ropiness or tenderness. ?CV:  RRR ?Lungs:  She has conversational dyspnea (stable) ?Neck/HEME:  There are no carotid bruits bilaterally. ? ?Neurological examination: ? ?Orientation: The patient is alert and oriented x3. ?Cranial nerves:  There is slight decreased NL fold on the right. The speech is fluent and clear. Soft palate rises symmetrically and there is no tongue deviation. Hearing is intact to conversational tone. ?Sensation: Sensation is intact to light touch throughout ?Motor: Strength is at least  antigravity x4. ? ?Movement examination: ?Tone: There is normal tone in the upper and lower extremities. ?Abnormal movements: Mild lower extremity dyskinesia (same as previous), mostly on the L ?Coordination:  There is just mild decremation today ? ? ?I have reviewed and interpreted the following labs independently ?  Chemistry   ?   ?Component Value Date/Time  ? NA 141 02/18/2020 1617  ? K 4.7 02/18/2020 1617  ? CL 102 02/18/2020 1617  ? CO2 24 02/18/2020 1617  ? BUN 15 02/18/2020 1617  ? CREATININE 1.09 (H) 02/18/2020 1617  ? CREATININE 0.91 04/23/2016 0913  ?    ?Component Value Date/Time  ? CALCIUM 9.3 02/18/2020 1617  ? ALKPHOS 123 (H) 02/18/2020 1617  ? AST 14 02/18/2020 1617  ? ALT 7 02/18/2020 1617  ? BILITOT 0.3 02/18/2020 1617  ?  ? ?Lab Results  ?Component Value Date  ? TSH 0.533 02/18/2020  ? ? ? ?Total time spent on today's visit was 33 minutes, including both face-to-face time and nonface-to-face time.  Time included that spent on review of records (prior notes available to me/labs/imaging if pertinent), discussing treatment and goals, answering patient's questions and coordinating care. ? ?Cc:  Gaynelle Arabian, MD ? ?

## 2022-03-06 ENCOUNTER — Ambulatory Visit: Payer: Medicare Other | Admitting: Neurology

## 2022-03-06 ENCOUNTER — Encounter: Payer: Self-pay | Admitting: Neurology

## 2022-03-06 VITALS — BP 124/82 | HR 68 | Ht 67.0 in | Wt 232.6 lb

## 2022-03-06 DIAGNOSIS — G238 Other specified degenerative diseases of basal ganglia: Secondary | ICD-10-CM

## 2022-03-06 MED ORDER — TRAZODONE HCL 50 MG PO TABS: 50.0000 mg | ORAL_TABLET | Freq: Every day | ORAL | 6 refills | Status: DC

## 2022-03-06 NOTE — Patient Instructions (Signed)
The physicians and staff at [facility] are committed to providing excellent care. You may receive a survey requesting feedback about your experience at our office. We strive to receive "very good" responses to the survey questions. If you feel that your experience would prevent you from giving the office a "very good " response, please contact our office to try to remedy the situation. We may be reached at [phone]. Thank you for taking the time out of your busy day to complete the survey.  

## 2022-03-06 NOTE — Addendum Note (Signed)
Addended by: Armen Pickup A on: 03/06/2022 09:59 AM ? ? Modules accepted: Orders ? ?

## 2022-03-07 NOTE — Addendum Note (Signed)
Addended by: Armen Pickup A on: 03/07/2022 11:24 AM ? ? Modules accepted: Orders ? ?

## 2022-03-22 ENCOUNTER — Other Ambulatory Visit: Payer: Self-pay | Admitting: Neurology

## 2022-03-22 DIAGNOSIS — R202 Paresthesia of skin: Secondary | ICD-10-CM

## 2022-05-27 ENCOUNTER — Other Ambulatory Visit: Payer: Self-pay | Admitting: Neurology

## 2022-05-27 ENCOUNTER — Other Ambulatory Visit: Payer: Self-pay

## 2022-05-31 ENCOUNTER — Ambulatory Visit: Payer: Medicare Other | Admitting: Neurology

## 2022-05-31 DIAGNOSIS — G5131 Clonic hemifacial spasm, right: Secondary | ICD-10-CM | POA: Diagnosis not present

## 2022-05-31 MED ORDER — ONABOTULINUMTOXINA 100 UNITS IJ SOLR
100.0000 [IU] | Freq: Once | INTRAMUSCULAR | Status: AC
  Administered 2022-05-31: 15 [IU] via INTRAMUSCULAR

## 2022-05-31 NOTE — Procedures (Signed)
[facility]   History:  Diagnosis: Hemifacial spasm   Initial side: right   Result History  R eye closing  Consent obtained from: The patient Benefits discussed included, but were not limited to decreased muscle tightness, increased joint range of motion, and decreased pain.  Risk discussed included, but were not limited pain and discomfort, bleeding, bruising, excessive weakness, venous thrombosis, muscle atrophy and dysphagia.  A copy of the patient medication guide was given to the patient which explains the blackbox warning.  Patients identity and treatment sites confirmed Yes.  .  Details of Procedure: Skin was cleaned with alcohol.  A 30 gauge, 1/2 inch needle was introduced to target mm.  Prior to injection, the needle plunger was aspirated to make sure the needle was not within a blood vessel.  There was no blood retrieved on aspiration.    Following is a summary of the muscles injected  And the amount of Botulinum toxin used:  Injections  Location Left  Right Units Number of sites        Corrugator      Frontalis      Lower Lid, Lateral  2.5 2.5   Lower Lid Medial      Upper Lid, Lateral  2.5 2.5   Upper Lid, Medial      Canthus 2.5 5.0 7.0   nasalis  2.5 2.5   Masseter      Procerus      Zygomaticus Major      TOTAL UNITS:   15    Agent: Botulinum Type A ( Onobotulinum Toxin type A ).  1 vials of Botox were used, each containing 50 units and freshly diluted with 2 mL of sterile, non-perserved saline   Total injected (Units): 15  Total wasted (Units):85 Pt tolerated procedure well without complications.   Reinjection is anticipated in 3 months. 

## 2022-06-17 ENCOUNTER — Other Ambulatory Visit: Payer: Self-pay | Admitting: Neurology

## 2022-06-17 NOTE — Telephone Encounter (Signed)
Patient needs a refill on the Clonazepam  sent to the [facility] pharmacy pill pack

## 2022-06-18 MED ORDER — CLONAZEPAM 0.5 MG PO TABS: 0.5000 mg | ORAL_TABLET | Freq: Every day | ORAL | 1 refills | Status: DC

## 2022-07-16 ENCOUNTER — Telehealth: Payer: Self-pay | Admitting: Neurology

## 2022-07-16 DIAGNOSIS — R519 Headache, unspecified: Secondary | ICD-10-CM | POA: Diagnosis not present

## 2022-07-16 DIAGNOSIS — G629 Polyneuropathy, unspecified: Secondary | ICD-10-CM | POA: Diagnosis not present

## 2022-07-16 DIAGNOSIS — I10 Essential (primary) hypertension: Secondary | ICD-10-CM | POA: Diagnosis not present

## 2022-07-16 DIAGNOSIS — H538 Other visual disturbances: Secondary | ICD-10-CM | POA: Diagnosis not present

## 2022-07-16 NOTE — Telephone Encounter (Signed)
Patient saw Dr. Marisue Humble today and had some blood work due to some concerns. She said the results will be sent to Dr. Carles Collet.  She's also requesting to move her appointment up, she has some concerns to discuss with Dr. Carles Collet.

## 2022-07-17 NOTE — Telephone Encounter (Signed)
Called patient and left voicemail. Labs are also posted in patients chart under media for Dr. Arturo Morton review I will print

## 2022-07-17 NOTE — Telephone Encounter (Signed)
Patient returned call to [place]. 

## 2022-07-17 NOTE — Telephone Encounter (Signed)
Called patient back and let her know that she would need to go to the ED for any CP and to ask  PCP about getting a UA . Patient understands that the wheelchair assessment and that the appointment is only about the wheelchair. Patient is going to start journal about her episodes and when and what may be causing them. She is currently checking her BP and has not had any issues with it being high or low. Patient said her eye Dr said there was nothing he could do with her double vision or te pain in her eyes

## 2022-07-17 NOTE — Telephone Encounter (Addendum)
In April the episodes started these episodes make the patient feel , like her body is shutting down whoosing in her ears chest hurts rt arm hurts deep perception is a real bad issue the episodes last for around 5-10 min but recently she had a episode that lasted 1 hour 10 min . Patient is having pain behind her rt eye to her temple patient is not frightened by these attacks but feeling worries and wanting to know if it has anything to do with her injections especially with her eyes she is also seeing double. Patient wanting a electric wheelchair and wanted to know if you could do the exam for her

## 2022-07-18 NOTE — Telephone Encounter (Signed)
Called pateint and she has a call in to the PCP for the UA and the CBC She will let us know if she needs help from our office to get testing done

## 2022-07-19 DIAGNOSIS — R350 Frequency of micturition: Secondary | ICD-10-CM | POA: Diagnosis not present

## 2022-07-19 NOTE — Progress Notes (Unsigned)
Virtual Visit Via Video     Consent was obtained for video visit:  Yes.   Answered questions that patient had about telehealth interaction:  Yes.   I discussed the limitations, risks, security and privacy concerns of performing an evaluation and management service by telemedicine. I also discussed with the patient that there may be a patient responsible charge related to this service. The patient expressed understanding and agreed to proceed.  Pt location: Home Physician Location: office Name of referring provider:  Gaynelle Arabian, MD I connected with Jacqueline Atkinson at patients initiation/request on 07/22/2022 at  8:45 AM EDT by video enabled telemedicine application and verified that I am speaking with the correct person using two identifiers. Pt MRN:  [id number] Pt DOB:  02-25-1956 Video Participants:  Jacqueline Atkinson;  ***  Assessment/Plan:   1.  MSA             -Continue carbidopa/levodopa 25/100, 1 tablet 8 times per day.  She generally takes this about every 2-3 hours             -Continue carbidopa/levodopa 50/200 at bedtime  -We will refer for PT for mobility evaluation.  2.  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  The other mouth movements that she describes are likely dyskinesia.  She and I discussed this today. 3.  Dysphagia.             -She had a MBE on November 19, 2017.  This was unremarkable, with the exception of the tablet of barium lodged at the distal esophagus at one point.  Mechanical soft diet with thin liquids as recommended.   4.  Urinary incontinence, hx (now controlled with med)             -saw urology and on oxybutynin.  Doing better on Myrbetriq, but having extreme dry mouth.   5.  RBD             -better with klonopin, 0.5 mg at night.  Still with day/night reversal though with this  -Continue trazodone, 50 mg.  Discussed potential interaction with cymbalta.   6.   Depression, mild             -She will continue Cymbalta, 60 mg daily 7.  Leg and feet pain  -Has followed with Dr. Letta Pate in the past.  Had lumbar medial branch blocks in July, but that seemed to make pain worse and she decided to hold off on further injections.  For now, she does not want to go back, primarily because of logistical issues getting back and forth  -On gabapentin, 300 mg, 1 capsule twice per day.  The gabapentin has helped the neuropathic pain.  She is also on Cymbalta, 60 mg daily, as above. 8.  SOB  -discussed pulm referral but she doesn't want to do that.  She is tired of going to more physicians. 9.  Parkinson's dyskinesia  -Continue amantadine, 100 mg 3 times per day   Subjective:   Jacqueline Atkinson was seen today in follow up for mobility evaluation.  Pt needs power WC as she has difficulty getting to bathroom on time. Pt having trouble navigating getting in and out of shower and power WC would be of great benefit with this task that walker is of no benefit for.  Pt has the mental capability  to use the power WC. Pt cannot use manual WC because of endurance abilities to propel and poor coordination of UE's. She could not mount a scooter/transfer onto safely, hence making it inappropriate for this patient.   She is currently on a traditional rollator, which has become unsafe.  she currently has ability to pressure relieve but in future she may not given progressive degenerative nature of this disease.  Pt needs ability of power WC in the future to tilt and recline if requires this.  Saw primary care recently for a myriad of complaints.  I did not get the records, but did get a chemistry, which was normal.  She did not get a urinalysis or CBC.  She is having much more difficulty with ambulation.  Current prescribed movement disorder medications: carbidopa/levodopa 25/100, 2 at 9am/1 at 11am/2 at 1pm/1 at 3pm/1 at 5pm/1 at 7pm Carbidopa/levodopa 50/200 at bedtime Clonazepam  0.5 mg at bedtime Cymbalta, 60 mg daily Gabapentin, 300 mg, 1 capsule twice per day  Amantadine, 100 mg 3 times per day  Trazodone, 50 mg nightly (added last visit)  PREVIOUS MEDICATIONS: carbidopa/levodopa 25/100 CR (didn't help changing to it);    ALLERGIES:   Allergies  Allergen Reactions   Sulfa Antibiotics     Long time ago and does not remember reaction    CURRENT MEDICATIONS:  Outpatient Encounter Medications as of 07/22/2022  Medication Sig   acetaminophen (TYLENOL) 500 MG tablet Take 1,000 mg by mouth every 8 (eight) hours as needed for moderate pain.    amantadine (SYMMETREL) 100 MG capsule Take 1 capsule by mouth three times daily in the morning, at noon, and at bedtime.   AMBULATORY NON FORMULARY MEDICATION Wheelchair Ramp  DX: G23.8   atenolol (TENORMIN) 100 MG tablet Take 1 tablet (100 mg) by mouth once daily.   BOTOX 100 units SOLR injection INJECT 100 UNITS INTO THE MUSCLES OF THE HEAD AND NECK ONCE FOR 1 DOSE   carbidopa-levodopa (SINEMET CR) 50-200 MG tablet Take 1 tablet by mouth at bedtime.   carbidopa-levodopa (SINEMET IR) 25-100 MG tablet 1 po q 2-3 hours 8 times per day   Carboxymethylcellul-Glycerin (LUBRICATING EYE DROPS OP) Place 1 drop into both eyes 3 (three) times daily as needed (dry eyes).   clonazePAM (KLONOPIN) 0.5 MG tablet Take 1 tablet (0.5 mg total) by mouth at bedtime.   cloNIDine (CATAPRES) 0.2 MG tablet Take 1 tablet by mouth every morning and, take 2 tablets by mouth every evening. **Please schedule an appointment for additional refills.**   DULoxetine (CYMBALTA) 30 MG capsule Take 30 mg by mouth daily.   DULoxetine (CYMBALTA) 60 MG capsule Take 1 capsule (60 mg total) by mouth daily.   esomeprazole (NEXIUM) 20 MG capsule Take 40 mg by mouth 2 (two) times daily. 2 tab in morning and 2 tab at night   gabapentin (NEURONTIN) 300 MG capsule Take 1 capsule by mouth twice daily.   MYRBETRIQ 50 MG TB24 tablet Take 1 tablet by mouth daily.     oxybutynin (DITROPAN) 5 MG tablet Take 1 tablet by mouth twice daily.   rosuvastatin (CRESTOR) 10 MG tablet Take 10 mg by mouth at bedtime.   senna-docusate (SENOKOT S) 8.6-50 MG tablet Take 2 tablets by mouth at bedtime. (Patient taking differently: Take 2 tablets by mouth daily. 2 tab in morning and 2 at night)   telmisartan (MICARDIS) 80 MG tablet Take 1 tablet by mouth once daily.   traZODone (DESYREL) 50 MG tablet Take 1  tablet (50 mg total) by mouth at bedtime.   No facility-administered encounter medications on file as of 07/22/2022.    Objective:   PHYSICAL EXAMINATION:    VITALS:   There were no vitals filed for this visit.    Wt Readings from Last 3 Encounters:  03/06/22 232 lb 9.6 oz (105.5 kg)  08/20/21 231 lb (104.8 kg)  02/08/21 242 lb 3.2 oz (109.9 kg)     GEN:  The patient appears stated age and is in NAD. HEENT:  Normocephalic, atraumatic.  The mucous membranes are moist. The superficial temporal arteries are without ropiness or tenderness. CV:  RRR Lungs:  She has conversational dyspnea (stable) Neck/HEME:  There are no carotid bruits bilaterally.  Neurological examination:  Orientation: The patient is alert and oriented x3. Cranial nerves: There is slight decreased NL fold on the right. The speech is fluent and clear. Soft palate rises symmetrically and there is no tongue deviation. Hearing is intact to conversational tone. Sensation: Sensation is intact to light touch throughout Motor: Strength is at least antigravity x4.  Movement examination: Tone: There is normal tone in the upper and lower extremities. Abnormal movements: Mild lower extremity dyskinesia (same as previous), mostly on the L Coordination:  There is just mild decremation today   I have reviewed and interpreted the following labs independently   Chemistry      Component Value Date/Time   NA 141 02/18/2020 1617   K 4.7 02/18/2020 1617   CL 102 02/18/2020 1617   CO2 24 02/18/2020  1617   BUN 15 02/18/2020 1617   CREATININE 1.09 (H) 02/18/2020 1617   CREATININE 0.91 04/23/2016 0913      Component Value Date/Time   CALCIUM 9.3 02/18/2020 1617   ALKPHOS 123 (H) 02/18/2020 1617   AST 14 02/18/2020 1617   ALT 7 02/18/2020 1617   BILITOT 0.3 02/18/2020 1617     Lab Results  Component Value Date   TSH 0.533 02/18/2020    Follow up Instructions      -I discussed the assessment and treatment plan with the patient. The patient was provided an opportunity to ask questions and all were answered. The patient agreed with the plan and demonstrated an understanding of the instructions.   The patient was advised to call back or seek an in-person evaluation if the symptoms worsen or if the condition fails to improve as anticipated.    Total time spent on today's visit was ***minutes, including both face-to-face time and nonface-to-face time.  Time included that spent on review of records (prior notes available to me/labs/imaging if pertinent), discussing treatment and goals, answering patient's questions and coordinating care.   Alonza Bogus, DO   Cc:  Gaynelle Arabian, MD

## 2022-07-22 ENCOUNTER — Other Ambulatory Visit: Payer: Self-pay

## 2022-07-22 ENCOUNTER — Telehealth (INDEPENDENT_AMBULATORY_CARE_PROVIDER_SITE_OTHER): Payer: Medicare Other | Admitting: Neurology

## 2022-07-22 DIAGNOSIS — G238 Other specified degenerative diseases of basal ganglia: Secondary | ICD-10-CM

## 2022-07-22 DIAGNOSIS — G43609 Persistent migraine aura with cerebral infarction, not intractable, without status migrainosus: Secondary | ICD-10-CM

## 2022-07-22 DIAGNOSIS — N39498 Other specified urinary incontinence: Secondary | ICD-10-CM

## 2022-07-22 DIAGNOSIS — G20 Parkinson's disease: Secondary | ICD-10-CM

## 2022-07-22 MED ORDER — TOPIRAMATE 50 MG PO TABS: 50.0000 mg | ORAL_TABLET | Freq: Every day | ORAL | 1 refills | Status: DC

## 2022-08-08 ENCOUNTER — Other Ambulatory Visit: Payer: Self-pay | Admitting: Neurology

## 2022-08-08 DIAGNOSIS — G238 Other specified degenerative diseases of basal ganglia: Secondary | ICD-10-CM

## 2022-08-30 DIAGNOSIS — K5900 Constipation, unspecified: Secondary | ICD-10-CM | POA: Diagnosis not present

## 2022-09-06 ENCOUNTER — Ambulatory Visit (INDEPENDENT_AMBULATORY_CARE_PROVIDER_SITE_OTHER): Payer: Medicare Other | Admitting: Neurology

## 2022-09-06 DIAGNOSIS — G5131 Clonic hemifacial spasm, right: Secondary | ICD-10-CM | POA: Diagnosis not present

## 2022-09-06 MED ORDER — ONABOTULINUMTOXINA 100 UNITS IJ SOLR
100.0000 [IU] | Freq: Once | INTRAMUSCULAR | Status: AC
  Administered 2022-09-06: 15 [IU] via INTRAMUSCULAR

## 2022-09-06 NOTE — Procedures (Signed)
[facility]   History:  Diagnosis: Hemifacial spasm   Initial side: right   Result History  R eye closing  Consent obtained from: The patient Benefits discussed included, but were not limited to decreased muscle tightness, increased joint range of motion, and decreased pain.  Risk discussed included, but were not limited pain and discomfort, bleeding, bruising, excessive weakness, venous thrombosis, muscle atrophy and dysphagia.  A copy of the patient medication guide was given to the patient which explains the blackbox warning.  Patients identity and treatment sites confirmed Yes.  .  Details of Procedure: Skin was cleaned with alcohol.  A 30 gauge, 1/2 inch needle was introduced to target mm.  Prior to injection, the needle plunger was aspirated to make sure the needle was not within a blood vessel.  There was no blood retrieved on aspiration.    Following is a summary of the muscles injected  And the amount of Botulinum toxin used:  Injections  Location Left  Right Units Number of sites        Corrugator      Frontalis      Lower Lid, Lateral  2.5 2.5   Lower Lid Medial      Upper Lid, Lateral  2.5 2.5   Upper Lid, Medial      Canthus 2.5 5.0 7.0   nasalis  2.5 2.5   Masseter      Procerus      Zygomaticus Major      TOTAL UNITS:   15    Agent: Botulinum Type A ( Onobotulinum Toxin type A ).  1 vials of Botox were used, each containing 50 units and freshly diluted with 2 mL of sterile, non-perserved saline   Total injected (Units): 15  Total wasted (Units):85 Pt tolerated procedure well without complications.   Reinjection is anticipated in 3 months.

## 2022-09-12 NOTE — Progress Notes (Signed)
Assessment/Plan:   1.  MSA             -Continue carbidopa/levodopa 25/100, 2 po qid             -Continue carbidopa/levodopa 50/200 at bedtime             -Have referred her for power wheelchair and she is schedule next Monday for PT eval  -Have emailed social work to see if they will be doing the atypical group this week.  She is interested.  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  2.  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   last injections were November 10 3.  Dysphagia.             -She had a MBE on November 19, 2017.  This was unremarkable, with the exception of the tablet of barium lodged at the distal esophagus at one point.  Mechanical soft diet with thin liquids as recommended.   4.  Urinary incontinence, hx (now controlled with med)             -saw urology and on oxybutynin.  Doing better on Myrbetriq, but having extreme dry mouth.   5.  RBD             -better with klonopin, 0.5 mg at night.  Still with day/night reversal though with this 6.  Depression, mild             -She will continue Cymbalta, 60 mg daily 7.  Leg and feet pain             -Has followed with Dr. Letta Pate in the past.  Had lumbar medial branch blocks previously and did not help.  She just does not want to go back because it is difficult for her to get to so many physicians             -On gabapentin, 300 mg, 1 capsule twice per day.  The gabapentin has helped the neuropathic pain.  She is also on Cymbalta, 60 mg daily, as above. 8.  Parkinson's dyskinesia             -Continue amantadine, 100 mg 3 times per day 9.  Possible migraine             -resolved with topamax 50 mg daily 10.  Constipation  -discussed nature and pathophysiology and association with parkinsonism, but not so  sure that she is really constipated.  She reports she has had no stool in 4 weeks, but also states that she had an x-ray that demonstrated no stool was present  -discussed importance of hydration.  Pt is to increase water intake  -pt is given a copy of the rancho recipe  -recommended daily colace  -recommended miralax daily for now  -I really do not want to give her any type of colon cleanse, especially if the x-ray showed no stool in the colon.  She cannot walk well and I do not think that she would get to the bathroom on time.    Subjective:   Jacqueline Atkinson was seen today in follow up for MSA. Daughter (April) with patient and supplements hx.   I saw  her on video at the end of September for some episodes that she was having that I thought could be possibly migrainous and I started her on low-dose topiramate, 50 mg daily.  She reports today that she is doing better.  We also referred her for power wheelchair at that time and she states that she is scheduled for eval with PT next mon.  Last visit, she wasn't on the trazodone but reports she is now.  Having depth perception issue but ophth said nothing further to do.  Pt/daughter admits to falls.  With one, she was opening kitchen door and had to let go of the walker to open the door and fell.  She isn't sure if she missed the door to pull it due to depth perception issues. She usually is able to get up with the use of a yoga block or gymnastic mat and climb up that and she will require assist per daughter.   Trouble with constipation, but actually states that an x-ray showed that she had no stool in the colon.  She states that she has had no bowel movements in 4 weeks.  Current prescribed movement disorder medications: carbidopa/levodopa 25/100, 2 po qid Carbidopa/levodopa 50/200 at bedtime Clonazepam 0.5 mg at bedtime Cymbalta, 60 mg daily Gabapentin, 300 mg, 1 capsule twice per day  Amantadine, 100 mg 3 times per day  Topamax, 50 mg  daily  PREVIOUS MEDICATIONS: carbidopa/levodopa 25/100 CR (didn't help changing to it); trazodone (given but did not take)  ALLERGIES:   Allergies  Allergen Reactions   Sulfa Antibiotics     Long time ago and does not remember reaction    CURRENT MEDICATIONS:  Outpatient Encounter Medications as of 09/16/2022  Medication Sig   acetaminophen (TYLENOL) 500 MG tablet Take 1,000 mg by mouth every 8 (eight) hours as needed for moderate pain.    amantadine (SYMMETREL) 100 MG capsule Take 1 capsule by mouth three times daily in the morning, at noon, and at bedtime.   AMBULATORY NON FORMULARY MEDICATION Wheelchair Ramp  DX: G23.8   atenolol (TENORMIN) 100 MG tablet Take 1 tablet (100 mg) by mouth once daily.   BOTOX 100 units SOLR injection INJECT 100 UNITS INTO THE MUSCLES OF THE HEAD AND NECK ONCE FOR 1 DOSE   Carboxymethylcellul-Glycerin (LUBRICATING EYE DROPS OP) Place 1 drop into both eyes 3 (three) times daily as needed (dry eyes).   clonazePAM (KLONOPIN) 0.5 MG tablet Take 1 tablet (0.5 mg total) by mouth at bedtime.   cloNIDine (CATAPRES) 0.2 MG tablet Take 1 tablet by mouth every morning and, take 2 tablets by mouth every evening. **Please schedule an appointment for additional refills.**   DULoxetine (CYMBALTA) 30 MG capsule Take 30 mg by mouth daily.   DULoxetine (CYMBALTA) 60 MG capsule Take 1 capsule (60 mg total) by mouth daily.   esomeprazole (NEXIUM) 20 MG capsule Take 40 mg by mouth 2 (two) times daily. 2 tab in morning and 2 tab at night   gabapentin (NEURONTIN) 300 MG capsule Take 1 capsule by mouth twice daily.   MYRBETRIQ 50 MG TB24 tablet Take 1 tablet by mouth daily.    oxybutynin (DITROPAN) 5 MG tablet Take 1 tablet by mouth twice daily.   rosuvastatin (CRESTOR) 10 MG tablet Take 10 mg by mouth at bedtime.   senna-docusate (SENOKOT S) 8.6-50 MG tablet Take 2 tablets by mouth at bedtime. (Patient taking differently: Take 2 tablets by mouth daily. 2 tab in morning and 2 at  night)   telmisartan (MICARDIS) 80 MG tablet Take 1 tablet by mouth once daily.   topiramate (TOPAMAX) 50 MG tablet Take 1 tablet (50 mg total) by mouth daily.   traZODone (DESYREL) 50 MG tablet Take 1 tablet (50 mg total) by mouth at bedtime.   carbidopa-levodopa (SINEMET CR) 50-200 MG tablet Take 1 tablet by mouth at bedtime. (Patient not taking: Reported on 09/16/2022)   carbidopa-levodopa (SINEMET IR) 25-100 MG tablet Take 1 tablet by mouth every 2 to 3 hours 8 times a day.   No facility-administered encounter medications on file as of 09/16/2022.    Objective:   PHYSICAL EXAMINATION:    VITALS:   Vitals:   09/16/22 0850  BP: (!) 132/90  Pulse: 82  SpO2: 97%  Weight: 206 lb (93.4 kg)  Height: '5\' 8"'$  (1.727 m)      Wt Readings from Last 3 Encounters:  09/16/22 206 lb (93.4 kg)  03/06/22 232 lb 9.6 oz (105.5 kg)  08/20/21 231 lb (104.8 kg)     GEN:  The patient appears stated age and is in NAD. HEENT:  Normocephalic, atraumatic.  The mucous membranes are moist. The superficial temporal arteries are without ropiness or tenderness.   Neurological examination:  Orientation: The patient is alert and oriented x3. Cranial nerves: There is slight decreased NL fold on the right. The speech is fluent and clear. Soft palate rises symmetrically and there is no tongue deviation. Hearing is intact to conversational tone. Sensation: Sensation is intact to light touch throughout Motor: Strength is at least antigravity x4.  Movement examination: Tone: There is normal tone in the upper and lower extremities. Abnormal movements: Mild lower extremity dyskinesia (same as previous), mostly on the L Coordination:  There is just mild decremation today   I have reviewed and interpreted the following labs independently   Chemistry      Component Value Date/Time   NA 141 02/18/2020 1617   K 4.7 02/18/2020 1617   CL 102 02/18/2020 1617   CO2 24 02/18/2020 1617   BUN 15 02/18/2020 1617    CREATININE 1.09 (H) 02/18/2020 1617   CREATININE 0.91 04/23/2016 0913      Component Value Date/Time   CALCIUM 9.3 02/18/2020 1617   ALKPHOS 123 (H) 02/18/2020 1617   AST 14 02/18/2020 1617   ALT 7 02/18/2020 1617   BILITOT 0.3 02/18/2020 1617     Lab Results  Component Value Date   TSH 0.533 02/18/2020     Total time spent on today's visit was 42 minutes, including both face-to-face time and nonface-to-face time.  Time included that spent on review of records (prior notes available to me/labs/imaging if pertinent), discussing treatment and goals, answering patient's questions and coordinating care.  Cc:  Gaynelle Arabian, MD

## 2022-09-16 ENCOUNTER — Encounter: Payer: Self-pay | Admitting: Neurology

## 2022-09-16 ENCOUNTER — Ambulatory Visit: Payer: Medicare Other | Admitting: Neurology

## 2022-09-16 VITALS — BP 132/90 | HR 82 | Ht 68.0 in | Wt 206.0 lb

## 2022-09-16 DIAGNOSIS — G238 Other specified degenerative diseases of basal ganglia: Secondary | ICD-10-CM

## 2022-09-16 DIAGNOSIS — K5901 Slow transit constipation: Secondary | ICD-10-CM | POA: Diagnosis not present

## 2022-09-16 MED ORDER — TOPIRAMATE 50 MG PO TABS
50.0000 mg | ORAL_TABLET | Freq: Every day | ORAL | 1 refills | Status: DC
Start: 2022-09-16 — End: 2023-01-06

## 2022-09-16 NOTE — Patient Instructions (Addendum)
Constipation and Parkinson's disease:  1.Rancho recipe for constipation in Parkinsons Disease:  -1 cup of unprocessed bran (need to get this at [employer], [employer] or similar type of store), 2 cups of applesauce in 1 cup of prune juice 2.  Increase fiber intake (Metamucil,vegetables) 3.  Regular, moderate exercise can be beneficial. 4.  Avoid medications causing constipation, such as medications like antacids with calcium or magnesium 5.  It's okay to take daily Miralax, and taper if stools become too loose or you experience diarrhea 6.  Stool softeners (Colace) can help with chronic constipation and I recommend you take this daily. 7.  Increase water intake.  You should be drinking 1/2 your body weight in oz  The physicians and staff at [facility] are committed to providing excellent care. You may receive a survey requesting feedback about your experience at our office. We strive to receive "very good" responses to the survey questions. If you feel that your experience would prevent you from giving the office a "very good " response, please contact our office to try to remedy the situation. We may be reached at [phone]. Thank you for taking the time out of your busy day to complete the survey.

## 2022-09-17 ENCOUNTER — Ambulatory Visit: Payer: Medicare Other | Admitting: Neurology

## 2022-09-18 ENCOUNTER — Other Ambulatory Visit: Payer: Self-pay | Admitting: Neurology

## 2022-09-18 DIAGNOSIS — R202 Paresthesia of skin: Secondary | ICD-10-CM

## 2022-09-23 DIAGNOSIS — G903 Multi-system degeneration of the autonomic nervous system: Secondary | ICD-10-CM | POA: Diagnosis not present

## 2022-09-23 DIAGNOSIS — G20B1 Parkinson's disease with dyskinesia, without mention of fluctuations: Secondary | ICD-10-CM | POA: Diagnosis not present

## 2022-10-18 ENCOUNTER — Other Ambulatory Visit: Payer: Self-pay | Admitting: Neurology

## 2022-10-18 DIAGNOSIS — G238 Other specified degenerative diseases of basal ganglia: Secondary | ICD-10-CM

## 2022-11-13 ENCOUNTER — Other Ambulatory Visit: Payer: Self-pay

## 2022-11-13 DIAGNOSIS — G5131 Clonic hemifacial spasm, right: Secondary | ICD-10-CM

## 2022-11-13 MED ORDER — BOTOX 100 UNITS IJ SOLR: INTRAMUSCULAR | 1 refills | Status: DC

## 2022-11-17 ENCOUNTER — Other Ambulatory Visit: Payer: Self-pay | Admitting: Neurology

## 2022-11-18 ENCOUNTER — Other Ambulatory Visit: Payer: Self-pay

## 2022-11-18 DIAGNOSIS — G5131 Clonic hemifacial spasm, right: Secondary | ICD-10-CM

## 2022-11-18 MED ORDER — BOTOX 100 UNITS IJ SOLR: INTRAMUSCULAR | 1 refills | Status: DC

## 2022-11-22 ENCOUNTER — Telehealth: Payer: Self-pay | Admitting: Pharmacy Technician

## 2022-11-22 ENCOUNTER — Other Ambulatory Visit (HOSPITAL_COMMUNITY): Payer: Self-pay

## 2022-11-22 NOTE — Telephone Encounter (Signed)
Called [facility] to check status of shipment. [facility] submitted new insurance verification (new ID# provided). Stated to check back on Tuesday.

## 2022-12-02 ENCOUNTER — Telehealth: Payer: Self-pay

## 2022-12-02 NOTE — Telephone Encounter (Signed)
Spoke to patient and we have a botox 100 units in the office and are able to give her the injection on Friday

## 2022-12-02 NOTE — Telephone Encounter (Signed)
Called pateint left message

## 2022-12-02 NOTE — Telephone Encounter (Signed)
Patient is returning a call to [facility] about insurance, didn't see phone note. Jacqueline Atkinson is returning call.

## 2022-12-06 ENCOUNTER — Ambulatory Visit: Payer: Medicare Other | Admitting: Neurology

## 2022-12-06 DIAGNOSIS — G5131 Clonic hemifacial spasm, right: Secondary | ICD-10-CM | POA: Diagnosis not present

## 2022-12-06 MED ORDER — ONABOTULINUMTOXINA 100 UNITS IJ SOLR
100.0000 [IU] | Freq: Once | INTRAMUSCULAR | Status: AC
  Administered 2022-12-06: 15 [IU] via INTRAMUSCULAR

## 2022-12-06 NOTE — Procedures (Signed)
[facility]   History:  Diagnosis: Hemifacial spasm   Initial side: right   Result History  Doing okay  Consent obtained from: The patient Benefits discussed included, but were not limited to decreased muscle tightness, increased joint range of motion, and decreased pain.  Risk discussed included, but were not limited pain and discomfort, bleeding, bruising, excessive weakness, venous thrombosis, muscle atrophy and dysphagia.  A copy of the patient medication guide was given to the patient which explains the blackbox warning.  Patients identity and treatment sites confirmed Yes.  .  Details of Procedure: Skin was cleaned with alcohol.  A 30 gauge, 1/2 inch needle was introduced to target mm.  Prior to injection, the needle plunger was aspirated to make sure the needle was not within a blood vessel.  There was no blood retrieved on aspiration.    Following is a summary of the muscles injected  And the amount of Botulinum toxin used:  Injections  Location Left  Right Units Number of sites        Corrugator      Frontalis      Lower Lid, Lateral  2.5 2.5   Lower Lid Medial      Upper Lid, Lateral  2.5 2.5   Upper Lid, Medial      Canthus 2.5 5.0 7.0   nasalis  2.5 2.5   Masseter      Procerus      Zygomaticus Major      TOTAL UNITS:   15    Agent: Botulinum Type A ( Onobotulinum Toxin type A ).  1 vials of Botox were used, each containing 50 units and freshly diluted with 2 mL of sterile, non-perserved saline   Total injected (Units): 15  Total wasted (Units):85 Pt tolerated procedure well without complications.   Reinjection is anticipated in 3 months.

## 2022-12-20 ENCOUNTER — Telehealth: Payer: Self-pay | Admitting: Neurology

## 2022-12-20 DIAGNOSIS — R1310 Dysphagia, unspecified: Secondary | ICD-10-CM

## 2022-12-20 NOTE — Telephone Encounter (Signed)
Pt called in stating she is having a very hard time swallowing. She can only swallow yogurt and liquid. If it has any kind of texture it makes her gag.

## 2022-12-20 NOTE — Telephone Encounter (Signed)
Order placed for swallow study  

## 2022-12-23 ENCOUNTER — Other Ambulatory Visit (HOSPITAL_COMMUNITY): Payer: Self-pay

## 2022-12-23 DIAGNOSIS — R1310 Dysphagia, unspecified: Secondary | ICD-10-CM

## 2022-12-26 ENCOUNTER — Other Ambulatory Visit: Payer: Self-pay

## 2022-12-26 ENCOUNTER — Telehealth: Payer: Self-pay | Admitting: Neurology

## 2022-12-26 DIAGNOSIS — G238 Other specified degenerative diseases of basal ganglia: Secondary | ICD-10-CM

## 2022-12-26 DIAGNOSIS — Z515 Encounter for palliative care: Secondary | ICD-10-CM

## 2022-12-26 NOTE — Telephone Encounter (Signed)
Patient daughter called and wants to get Hospice started for her mother  please call

## 2022-12-26 NOTE — Telephone Encounter (Signed)
Called patients daughter and she informed me patient has gone down hill quickly with multiple syncopal episodes., moments of lucidly and patient who rarely has bowel movements is having reoccurring bowel movements. I asked if they had the patient tested for infection and they said her mom is not interested in going to the hospital or any testing. They see her decline. I asked if she would like to start with palliative care and they will determine from there she said she would like to get it started

## 2022-12-27 NOTE — Telephone Encounter (Signed)
I have been in contact with [facility] this morning as well as this patients family to get a referral for this patient

## 2022-12-27 NOTE — Telephone Encounter (Signed)
De with [facility] called in and left a message with the access nurse 12/26/22. Caller states the daughter called stating they wanted hospice and they wanted them today. Caller states she is unable to do that today. Caller states that daughter said her mom has not ate in 2 weeks. Caller states she is trying to get a hospice referral.

## 2022-12-30 NOTE — Progress Notes (Signed)
COMMUNITY PALLIATIVE CARE SW NOTE  PATIENT NAME: Jacqueline Atkinson DOB: 02-03-1956 MRN: [id number]  PRIMARY CARE PROVIDER: Gaynelle Arabian, MD  RESPONSIBLE PARTY:  Acct ID - Guarantor Home Phone Work Phone Relationship Acct Type  [id number] BRENDALYNN, [phone]  Self P/F     [place], [place], [place] [address]   Initial Palliative Care Telephonic Visit/Clinical Social Work  [facility] SW completed a telephonic encounter with patient's daughter regarding the referral received and the palliative care program. SW provided education regarding the palliative care program, visit frequency and role in patient's care. Her daughter advises that patient  has not eaten in two weeks, is unable to swallow her pills, has a blank stare, is now non-verbal, non-ambulatory and total care. Her daughter feels that patient is beyond palliative care services and is requesting hospice care. SW advised her she will update the palliative care nurse and ask her follow-up with Dr. Carles Collet for an order.   Palliative Care nurse-D. Georgann Housekeeper was updated for follow-up with patient's doctor.   Social History   Tobacco Use   Smoking status: Former    Packs/day: 1.00    Years: 12.00    Total pack years: 12.00    Types: Cigarettes    Quit date: 03/14/1988    Years since quitting: 34.8   Smokeless tobacco: Never  Substance Use Topics   Alcohol use: No    CODE STATUS: No ADVANCED DIRECTIVES: Yes MOST FORM COMPLETE:  No HOSPICE EDUCATION PROVIDED: Yes, order requested.  Duration of telephonic encounter and documentation: 30 minutes.  [address] [place], [place]

## 2023-01-03 ENCOUNTER — Encounter (HOSPITAL_COMMUNITY): Payer: Medicare Other

## 2023-01-03 ENCOUNTER — Ambulatory Visit (HOSPITAL_COMMUNITY): Payer: Medicare Other

## 2023-01-03 ENCOUNTER — Telehealth: Payer: Self-pay | Admitting: Neurology

## 2023-01-03 NOTE — Telephone Encounter (Signed)
Spoke to patients daughter and she said patient is non responsive and Hospice is only administering medications for pain

## 2023-01-03 NOTE — Telephone Encounter (Signed)
Called [facility] and they transferred me to [facility] and said that she was her patient. Unable to reach [facility] left voicemail. Called patients daughter as well and left message to see if they needed anything

## 2023-01-03 NOTE — Telephone Encounter (Signed)
Chelsea, please call [facility].  The last note I see is that they were calling us for orders but I talked with someone from there and they told me they didn't need any orders.  What's her status?  Why don't we see any physician/pA notes?  The only note I see is a LCSW note.  thanks

## 2023-01-06 NOTE — Telephone Encounter (Signed)
Pt died yesterday.  Called daughter and expressed condolences.

## 2023-01-27 DEATH — deceased

## 2023-03-07 ENCOUNTER — Ambulatory Visit: Payer: Medicare Other | Admitting: Neurology

## 2024-05-25 NOTE — Procedures (Signed)
 SABRA
